# Patient Record
Sex: Female | Born: 1965 | Race: Black or African American | Hispanic: No | Marital: Married | Smoking: Never smoker
Health system: Southern US, Community
[De-identification: ages and names within clinical notes are randomized; demographics above are authoritative.]

## PROBLEM LIST (undated history)

## (undated) DIAGNOSIS — D649 Anemia, unspecified: Secondary | ICD-10-CM

## (undated) DIAGNOSIS — Z9221 Personal history of antineoplastic chemotherapy: Secondary | ICD-10-CM

## (undated) DIAGNOSIS — Z923 Personal history of irradiation: Secondary | ICD-10-CM

## (undated) DIAGNOSIS — E739 Lactose intolerance, unspecified: Secondary | ICD-10-CM

## (undated) DIAGNOSIS — C50919 Malignant neoplasm of unspecified site of unspecified female breast: Secondary | ICD-10-CM

## (undated) DIAGNOSIS — M542 Cervicalgia: Secondary | ICD-10-CM

## (undated) DIAGNOSIS — M549 Dorsalgia, unspecified: Secondary | ICD-10-CM

## (undated) DIAGNOSIS — M25569 Pain in unspecified knee: Secondary | ICD-10-CM

## (undated) DIAGNOSIS — Z87440 Personal history of urinary (tract) infections: Secondary | ICD-10-CM

## (undated) HISTORY — DX: Personal history of urinary (tract) infections: Z87.440

## (undated) HISTORY — PX: BACK SURGERY: SHX140

## (undated) HISTORY — DX: Anemia, unspecified: D64.9

## (undated) HISTORY — DX: Pain in unspecified knee: M25.569

## (undated) HISTORY — DX: Malignant neoplasm of unspecified site of unspecified female breast: C50.919

## (undated) HISTORY — PX: FOOT SURGERY: SHX648

## (undated) HISTORY — DX: Cervicalgia: M54.2

## (undated) HISTORY — DX: Personal history of antineoplastic chemotherapy: Z92.21

## (undated) HISTORY — DX: Lactose intolerance, unspecified: E73.9

## (undated) HISTORY — DX: Dorsalgia, unspecified: M54.9

---

## 1998-04-22 ENCOUNTER — Emergency Department (HOSPITAL_COMMUNITY): Admission: EM | Admit: 1998-04-22 | Discharge: 1998-04-22 | Payer: Self-pay | Admitting: Emergency Medicine

## 2000-09-15 ENCOUNTER — Encounter: Payer: Self-pay | Admitting: Emergency Medicine

## 2000-09-15 ENCOUNTER — Emergency Department (HOSPITAL_COMMUNITY): Admission: EM | Admit: 2000-09-15 | Discharge: 2000-09-15 | Payer: Self-pay | Admitting: Emergency Medicine

## 2002-08-23 ENCOUNTER — Encounter: Admission: RE | Admit: 2002-08-23 | Discharge: 2002-08-23 | Payer: Self-pay | Admitting: Family Medicine

## 2002-08-23 ENCOUNTER — Encounter: Payer: Self-pay | Admitting: Family Medicine

## 2002-11-01 ENCOUNTER — Emergency Department (HOSPITAL_COMMUNITY): Admission: EM | Admit: 2002-11-01 | Discharge: 2002-11-01 | Payer: Self-pay | Admitting: Emergency Medicine

## 2003-06-05 ENCOUNTER — Encounter: Payer: Self-pay | Admitting: Obstetrics and Gynecology

## 2003-06-05 ENCOUNTER — Ambulatory Visit (HOSPITAL_COMMUNITY): Admission: RE | Admit: 2003-06-05 | Discharge: 2003-06-05 | Payer: Self-pay | Admitting: Obstetrics and Gynecology

## 2005-03-01 ENCOUNTER — Ambulatory Visit: Payer: Self-pay | Admitting: Family Medicine

## 2005-04-25 ENCOUNTER — Ambulatory Visit: Payer: Self-pay | Admitting: Family Medicine

## 2005-09-07 ENCOUNTER — Ambulatory Visit (HOSPITAL_COMMUNITY): Admission: RE | Admit: 2005-09-07 | Discharge: 2005-09-07 | Payer: Self-pay | Admitting: Obstetrics and Gynecology

## 2005-10-22 ENCOUNTER — Ambulatory Visit: Payer: Self-pay | Admitting: Internal Medicine

## 2006-03-01 ENCOUNTER — Ambulatory Visit: Payer: Self-pay | Admitting: Family Medicine

## 2006-07-28 ENCOUNTER — Ambulatory Visit: Payer: Self-pay | Admitting: Family Medicine

## 2006-09-07 ENCOUNTER — Ambulatory Visit: Payer: Self-pay | Admitting: Family Medicine

## 2006-09-25 ENCOUNTER — Ambulatory Visit (HOSPITAL_COMMUNITY): Admission: RE | Admit: 2006-09-25 | Discharge: 2006-09-25 | Payer: Self-pay | Admitting: Obstetrics and Gynecology

## 2007-04-20 ENCOUNTER — Ambulatory Visit: Payer: Self-pay | Admitting: Internal Medicine

## 2007-10-17 ENCOUNTER — Ambulatory Visit (HOSPITAL_COMMUNITY): Admission: RE | Admit: 2007-10-17 | Discharge: 2007-10-17 | Payer: Self-pay | Admitting: Obstetrics and Gynecology

## 2008-02-12 ENCOUNTER — Telehealth (INDEPENDENT_AMBULATORY_CARE_PROVIDER_SITE_OTHER): Payer: Self-pay | Admitting: *Deleted

## 2008-02-13 ENCOUNTER — Ambulatory Visit: Payer: Self-pay | Admitting: Family Medicine

## 2008-02-13 DIAGNOSIS — H9209 Otalgia, unspecified ear: Secondary | ICD-10-CM | POA: Insufficient documentation

## 2008-02-13 DIAGNOSIS — J069 Acute upper respiratory infection, unspecified: Secondary | ICD-10-CM | POA: Insufficient documentation

## 2008-05-26 ENCOUNTER — Ambulatory Visit: Payer: Self-pay | Admitting: Internal Medicine

## 2008-05-26 DIAGNOSIS — J019 Acute sinusitis, unspecified: Secondary | ICD-10-CM

## 2008-05-27 ENCOUNTER — Telehealth (INDEPENDENT_AMBULATORY_CARE_PROVIDER_SITE_OTHER): Payer: Self-pay | Admitting: *Deleted

## 2008-06-10 ENCOUNTER — Ambulatory Visit: Payer: Self-pay | Admitting: Internal Medicine

## 2009-12-10 ENCOUNTER — Encounter: Payer: Self-pay | Admitting: Family Medicine

## 2009-12-15 ENCOUNTER — Encounter: Payer: Self-pay | Admitting: Family Medicine

## 2010-08-23 ENCOUNTER — Ambulatory Visit: Payer: Self-pay | Admitting: Family Medicine

## 2010-08-23 DIAGNOSIS — R634 Abnormal weight loss: Secondary | ICD-10-CM | POA: Insufficient documentation

## 2010-08-23 DIAGNOSIS — M25559 Pain in unspecified hip: Secondary | ICD-10-CM | POA: Insufficient documentation

## 2010-08-24 ENCOUNTER — Encounter: Payer: Self-pay | Admitting: Family Medicine

## 2010-08-25 LAB — CONVERTED CEMR LAB
ALT: 15 units/L (ref 0–35)
AST: 21 units/L (ref 0–37)
Albumin: 3.8 g/dL (ref 3.5–5.2)
Alkaline Phosphatase: 58 units/L (ref 39–117)
Basophils Relative: 0.6 % (ref 0.0–3.0)
CO2: 26 meq/L (ref 19–32)
Calcium: 10 mg/dL (ref 8.4–10.5)
Folate: 11.8 ng/mL
GFR calc non Af Amer: 84.79 mL/min (ref >60)
Glucose, Bld: 66 mg/dL — ABNORMAL LOW (ref 70–99)
HCT: 32.6 % — ABNORMAL LOW (ref 36.0–46.0)
Hemoglobin: 11 g/dL — ABNORMAL LOW (ref 12.0–15.0)
Lymphocytes Relative: 22.4 % (ref 12.0–46.0)
Lymphs Abs: 1.3 10*3/uL (ref 0.7–4.0)
Monocytes Relative: 5.3 % (ref 3.0–12.0)
Neutro Abs: 4.1 10*3/uL (ref 1.4–7.7)
Potassium: 4.2 meq/L (ref 3.5–5.1)
RBC: 3.67 M/uL — ABNORMAL LOW (ref 3.87–5.11)
Sodium: 135 meq/L (ref 135–145)
TSH: 2.33 microintl units/mL (ref 0.35–5.50)
Total Protein: 6.6 g/dL (ref 6.0–8.3)

## 2010-09-09 ENCOUNTER — Ambulatory Visit: Payer: Self-pay | Admitting: Family Medicine

## 2010-09-24 ENCOUNTER — Encounter: Payer: Self-pay | Admitting: Family Medicine

## 2010-09-24 ENCOUNTER — Ambulatory Visit: Payer: Self-pay | Admitting: Internal Medicine

## 2010-09-24 DIAGNOSIS — R198 Other specified symptoms and signs involving the digestive system and abdomen: Secondary | ICD-10-CM

## 2010-09-24 DIAGNOSIS — K5900 Constipation, unspecified: Secondary | ICD-10-CM

## 2010-09-24 DIAGNOSIS — K5909 Other constipation: Secondary | ICD-10-CM | POA: Insufficient documentation

## 2010-09-24 DIAGNOSIS — K6289 Other specified diseases of anus and rectum: Secondary | ICD-10-CM

## 2010-09-27 ENCOUNTER — Telehealth (INDEPENDENT_AMBULATORY_CARE_PROVIDER_SITE_OTHER): Payer: Self-pay | Admitting: *Deleted

## 2010-09-27 LAB — CONVERTED CEMR LAB
Basophils Absolute: 0 10*3/uL (ref 0.0–0.1)
Basophils Relative: 0 % (ref 0–1)
Eosinophils Absolute: 0 10*3/uL (ref 0.0–0.7)
Eosinophils Relative: 0 % (ref 0–5)
HCT: 33.8 % — ABNORMAL LOW (ref 36.0–46.0)
Lymphs Abs: 1.4 10*3/uL (ref 0.7–4.0)
MCHC: 31.4 g/dL (ref 30.0–36.0)
MCV: 89.4 fL (ref 78.0–100.0)
Platelets: 221 10*3/uL (ref 150–400)
RDW: 12.6 % (ref 11.5–15.5)

## 2010-10-01 ENCOUNTER — Telehealth: Payer: Self-pay | Admitting: Internal Medicine

## 2010-10-02 ENCOUNTER — Encounter: Payer: Self-pay | Admitting: Internal Medicine

## 2010-10-13 ENCOUNTER — Encounter: Payer: Self-pay | Admitting: Family Medicine

## 2010-10-22 ENCOUNTER — Telehealth (INDEPENDENT_AMBULATORY_CARE_PROVIDER_SITE_OTHER): Payer: Self-pay | Admitting: *Deleted

## 2010-10-29 ENCOUNTER — Ambulatory Visit: Payer: Self-pay | Admitting: Family Medicine

## 2010-11-01 ENCOUNTER — Encounter: Payer: Self-pay | Admitting: Family Medicine

## 2010-12-06 ENCOUNTER — Telehealth (INDEPENDENT_AMBULATORY_CARE_PROVIDER_SITE_OTHER): Payer: Self-pay | Admitting: *Deleted

## 2010-12-12 ENCOUNTER — Encounter: Payer: Self-pay | Admitting: Obstetrics and Gynecology

## 2010-12-23 NOTE — Op Note (Signed)
Summary: [facility]  [facility]   Imported By: Lanelle Bal 10/23/2010 11:29:09  _____________________________________________________________________  External Attachment:    Type:   Image     Comment:   External Document

## 2010-12-23 NOTE — Consult Note (Signed)
Summary: [facility]   Imported By: Lanelle Bal 12/17/2010 11:20:11  _____________________________________________________________________  External Attachment:    Type:   Image     Comment:   External Document

## 2010-12-23 NOTE — Assessment & Plan Note (Signed)
Summary: LEFT LEG STILL HURTS///SPH   Vital Signs:  Patient profile:   45 year old female Weight:      171.0 pounds Temp:     98.1 degrees F oral Pulse rate:   72 / minute Pulse rhythm:   regular BP sitting:   118 / 80  (left arm) Cuff size:   regular  Vitals Entered By: [name] CMA Duncan Dull) (September 09, 2010 8:24 AM) CC: c/o pain to the left hip and leg Pain Assessment Patient in pain? yes     Location: hip Intensity: 10 Type: sharp Onset of pain  With activity   History of Present Illness: Pt here still c/o L hip pain that radiates to low leg--it does not radiate to foot.  Pt had MRI in [place] and she has brought in those results.   Steps and walking do not bother her but sitting is very painful.     Current Medications (verified): 1)  Flexeril 10 Mg Tabs (Cyclobenzaprine Hcl) .Marland Kitchen.. 1 By Mouth Three Times A Day As Needed 2)  Advil 200 Mg Caps (Ibuprofen) .... 2 By Mouth As Needed  For Pain 3)  Vicodin Es 7.5-750 Mg Tabs (Hydrocodone-Acetaminophen) .Marland Kitchen.. 1 By Mouth Q6h As Needed  Allergies (verified): 1)  ! Penicillin 2)  ! Clarithromycin (Clarithromycin) 3)  ! * Shrimp  Past History:  Past medical, surgical, family and social histories (including risk factors) reviewed for relevance to current acute and chronic problems.  Family History: Reviewed history from 04/23/2007 and no changes required. Family History Diabetes 1st degree relative Family History Hypertension  Social History: Reviewed history from 04/23/2007 and no changes required. Married Never Smoked Alcohol use-no Drug use-no  Review of Systems      See HPI  Physical Exam  General:  Well-developed,well-nourished,in no acute distress; alert,appropriate and cooperative throughout examination Msk:  No deformity or scoliosis noted of thoracic or lumbar spine.   Extremities:  No clubbing, cyanosis, edema, or deformity noted with normal full range of motion of all joints.   Neurologic:  alert &  oriented X3, strength normal in all extremities, gait normal, and DTRs symmetrical and normal.   Skin:  Intact without suspicious lesions or rashes Psych:  Cognition and judgment appear intact. Alert and cooperative with normal attention span and concentration. No apparent delusions, illusions, hallucinations   Impression & Recommendations:  Problem # 1:  HIP PAIN, LEFT, CHRONIC (ICD-719.45)  Her updated medication list for this problem includes:    Flexeril 10 Mg Tabs (Cyclobenzaprine hcl) .Marland Kitchen... 1 by mouth three times a day as needed    Advil 200 Mg Caps (Ibuprofen) .Marland Kitchen... 2 by mouth as needed  for pain    Vicodin Es 7.5-750 Mg Tabs (Hydrocodone-acetaminophen) .Marland Kitchen... 1 by mouth q6h as needed  Orders: Orthopedic Surgeon Referral (Ortho Surgeon)  Discussed use of medications, application of heat or cold, and exercises.   Complete Medication List: 1)  Flexeril 10 Mg Tabs (Cyclobenzaprine hcl) .Marland Kitchen.. 1 by mouth three times a day as needed 2)  Advil 200 Mg Caps (Ibuprofen) .... 2 by mouth as needed  for pain 3)  Vicodin Es 7.5-750 Mg Tabs (Hydrocodone-acetaminophen) .Marland Kitchen.. 1 by mouth q6h as needed Prescriptions: VICODIN ES 7.5-750 MG TABS (HYDROCODONE-ACETAMINOPHEN) 1 by mouth q6h as needed  #30 x 0   Entered and Authorized by:   [name] DO   Signed by:   [name] DO on 09/09/2010   Method used:   Print then Give to Patient  RxID:   [id number]    Orders Added: 1)  Orthopedic Surgeon Referral [Ortho Surgeon] 2)  Est. Patient Level III [14782]

## 2010-12-23 NOTE — Letter (Signed)
Summary: [facility]: Immunoassay Fecal Occult Blood (iFOB) Order Form  [facility] [place]  [address] [place], [place] [address]   Phone: [phone]  Fax: [phone]      [facility]: Immunoassay Fecal Occult Blood (iFOB) Order Form   August 24, 2010 MRN: [id number]   Alisha Chen 03-31-1966   Physicican Name: Dr.Lowne  Diagnosis Code: V56.71       [name] CMA (AAMA)

## 2010-12-23 NOTE — Progress Notes (Signed)
Summary: Lab Results  Phone Note Outgoing Call Call back at [facility] Phone [phone]   Call placed by: Shonna Chock CMA,  September 27, 2010 3:13 PM Call placed to: Patient Details for Reason: Lab Results Summary of Call: Spoke with patient   Mild anemia still present, but it is  stable to improved. Using the topical rather than oral narcotic  should improve the bowel symptoms by being less consstipating..Also the Probiotic , Align. is recommended once daily until bowels are normal. This will replace normal GI tract organisms.  Marland KitchenShonna Chock CMA  September 27, 2010 3:13 PM

## 2010-12-23 NOTE — Consult Note (Signed)
Summary: [facility]   Imported By: Lanelle Bal 10/13/2010 13:52:15  _____________________________________________________________________  External Attachment:    Type:   Image     Comment:   External Document

## 2010-12-23 NOTE — Assessment & Plan Note (Signed)
Summary: fever/loosing weight//kn   Vital Signs:  Patient profile:   45 year old female Height:      67 inches Weight:      170.6 pounds BMI:     26.82 Temp:     98.3 degrees F oral Pulse rate:   72 / minute Pulse rhythm:   regular BP sitting:   112 / 66  (right arm) Cuff size:   regular  Vitals Entered By: [name] CMA Duncan Dull) (August 23, 2010 11:03 AM) CC: c/o fever and weight loss   History of Present Illness: Pt here because she is concerned about low grade fevers and weight loss.  When she got back from [place]  she stopped exercising and is still loosing weight.  She states she is not dieting at all.  In [place] she was exercising and eating what she wanted and lost 20 lbs.    Pt otherwise feels ok except for pain in L hip.  Pt had been struggling with it since she has been back.  She had xray in [place] and was told she probably had a pinched nerve--- she had disk space narrowing.   Pain goes down L leg.    Current Medications (verified): 1)  Flexeril 10 Mg Tabs (Cyclobenzaprine Hcl) .Marland Kitchen.. 1 By Mouth Three Times A Day As Needed  Allergies (verified): 1)  ! Penicillin 2)  ! Clarithromycin (Clarithromycin) 3)  ! * Shrimp  Past History:  Past medical, surgical, family and social histories (including risk factors) reviewed for relevance to current acute and chronic problems.  Family History: Reviewed history from 04/23/2007 and no changes required. Family History Diabetes 1st degree relative Family History Hypertension  Social History: Reviewed history from 04/23/2007 and no changes required. Married Never Smoked Alcohol use-no Drug use-no  Review of Systems      See HPI  Physical Exam  General:  Well-developed,well-nourished,in no acute distress; alert,appropriate and cooperative throughout examination Ears:  External ear exam shows no significant lesions or deformities.  Otoscopic examination reveals clear canals, tympanic membranes are intact bilaterally without  bulging, retraction, inflammation or discharge. Hearing is grossly normal bilaterally. Nose:  External nasal examination shows no deformity or inflammation. Nasal mucosa are pink and moist without lesions or exudates. Mouth:  Oral mucosa and oropharynx without lesions or exudates.  Teeth in good repair. Neck:  No deformities, masses, or tenderness noted. Lungs:  Normal respiratory effort, chest expands symmetrically. Lungs are clear to auscultation, no crackles or wheezes. Heart:  Normal rate and regular rhythm. S1 and S2 normal without gallop, murmur, click, rub or other extra sounds. Abdomen:  Bowel sounds positive,abdomen soft and non-tender without masses, organomegaly or hernias noted. Extremities:  No clubbing, cyanosis, edema, or deformity noted with normal full range of motion of all joints.   Skin:  Intact without suspicious lesions or rashes Cervical Nodes:  No lymphadenopathy noted Psych:  Cognition and judgment appear intact. Alert and cooperative with normal attention span and concentration. No apparent delusions, illusions, hallucinations   Impression & Recommendations:  Problem # 1:  WEIGHT LOSS (ICD-783.21) if all is negative consider GI referral Orders: Venipuncture ([address]) TLB-B12 + Folate Pnl (60454_09811-B14/NWG) TLB-BMP (Basic Metabolic Panel-BMET) (80048-METABOL) TLB-CBC Platelet - w/Differential (85025-CBCD) TLB-Hepatic/Liver Function Pnl (80076-HEPATIC) TLB-TSH (Thyroid Stimulating Hormone) (84443-TSH) TLB-T3, Free (Triiodothyronine) (84481-T3FREE) TLB-T4 (Thyrox), Free (95621-HY8M)  Problem # 2:  HIP PAIN, LEFT, CHRONIC (ICD-719.45)  Her updated medication list for this problem includes:    Flexeril 10 Mg Tabs (Cyclobenzaprine hcl) .Marland KitchenMarland KitchenMarland KitchenMarland Kitchen  1 by mouth three times a day as needed  Orders: Physical Therapy Referral (PT)  Discussed use of medications, application of heat or cold, and exercises.   Complete Medication List: 1)  Flexeril 10 Mg Tabs  (Cyclobenzaprine hcl) .Marland Kitchen.. 1 by mouth three times a day as needed Prescriptions: FLEXERIL 10 MG TABS (CYCLOBENZAPRINE HCL) 1 by mouth three times a day as needed  #30 x 0   Entered and Authorized by:   [name] DO   Signed by:   [name] DO on 08/23/2010   Method used:   [facility] to        [facility]  [address] [id number]       [address]       [place], [place]  [address]       Ph: [phone] or [phone]       Fax: [phone]   RxID:   [id number]

## 2010-12-23 NOTE — Progress Notes (Signed)
Summary: pain patch not working  Phone Note Call from Patient Call back at [facility] Phone [phone]   Details for Reason: [facility] [address] Summary of Call: Patient notes that the pain in her leg is severe and the pain patch is not helping at all. Pt notes that she previously tried Hydrocodone but that caused abd pain and GI upset. Please advise. Initial call taken by: Lucious Groves CMA,  October 01, 2010 4:51 PM  Follow-up for Phone Call        Patient notified and will take off the pain patch and try Tramadol. [facility] [address] Follow-up by: Lucious Groves CMA,  October 01, 2010 5:17 PM    New/Updated Medications: TRAMADOL HCL 50 MG TABS (TRAMADOL HCL) 1 every 6 hrs as needed  for pain Prescriptions: TRAMADOL HCL 50 MG TABS (TRAMADOL HCL) 1 every 6 hrs as needed  for pain  #30 x 0   Entered and Authorized by:   Marga Melnick MD   Signed by:   Marga Melnick MD on 10/01/2010   Method used:   Printed then faxed to ...         RxID:   [id number]

## 2010-12-23 NOTE — Progress Notes (Signed)
Summary: I-fob not sent into [facility]  Phone Note From Other Clinic   Caller: Clydie Braun @ [facility] Details for Reason: I-fob not sent back to lab Summary of Call: I called patient and advised that the lab has not recieved her I-fob, per pt she had not done it yet and not sure where she put it. I advised I can leave another upfront for her, pt agreed and stated she will pick it up next week, advised if not she will be charged, she voiced understanding. call ended. Initial call taken by: [name] CMA Duncan Dull),  October 22, 2010 4:52 PM

## 2010-12-23 NOTE — Assessment & Plan Note (Signed)
Summary: PAINFUL LOOSE STOOLS/RH......Marland Kitchen   Vital Signs:  Patient profile:   45 year old female Weight:      170.2 pounds BMI:     26.75 Temp:     99.0 degrees F oral Pulse rate:   88 / minute Resp:     17 per minute BP sitting:   114 / 76  (right arm) Cuff size:   large  Vitals Entered By: Shonna Chock CMA (September 24, 2010 1:42 PM) CC: Onset Wed: Constipation all night until 6am Thurs, patient took suppository and the stool loosened and now painful loose stools (dark brown). Last BM about 11am today, patient unable to eat or unable to tell if blood present in stool due to currently on Menstural.   Primary Care Provider:  Laury Axon  CC:  Onset Wed: Constipation all night until 6am Thurs, patient took suppository and the stool loosened and now painful loose stools (dark brown). Last BM about 11am today, and patient unable to eat or unable to tell if blood present in stool due to currently on Menstural..  History of Present Illness: Onset approx 5 pm  11/02 as constipation . Stools  soft  since Glycerin use 10 pm 11/2. Stools  had become frequent , malordorous  & dark   from  5 pm   11/03 to 11 am today. She has stopped eating as of 11/02 due to pain. She is on Hydrocodone every 4-6 hrs as needed for leg pain..  The patient reports rectal pain,  abdominal cramping, and nausea.  Associated symptoms include low grade  fever, up to 100.  The patient denies the following symptoms: urinary retention.  Risk factors for constipation include  narcotic medication X 1 month.  The patient's medical history is  negative  for irritable bowel syndrome or GI processes . No PMH of thyroid disease. No FH bowel disease  .  Current Medications (verified): 1)  Flexeril 10 Mg Tabs (Cyclobenzaprine Hcl) .Marland Kitchen.. 1 By Mouth Three Times A Day As Needed 2)  Advil 200 Mg Caps (Ibuprofen) .... 2 By Mouth As Needed  For Pain 3)  Hydrocodone-Acetaminophen 5-325 Mg Tabs (Hydrocodone-Acetaminophen) .Marland Kitchen.. 1 By Mouth Every 4-6  Hours As Needed  Allergies: 1)  ! Penicillin 2)  ! Clarithromycin (Clarithromycin) 3)  ! * Shrimp  Physical Exam  General:  well-nourished,in no acute distress; alert,appropriate and cooperative throughout examination; uncomfortable-appearing.   Eyes:  No corneal or conjunctival inflammation noted. No icterus Mouth:  Oral mucosa and oropharynx without lesions or exudates.  No pharyngeal erythema.  Tongue moist Lungs:  Normal respiratory effort, chest expands symmetrically. Lungs are clear to auscultation, no crackles or wheezes. Heart:  Normal rate and regular rhythm. S1 and S2 normal without gallop, murmur, click, rub or other extra sounds. Abdomen:  Bowel sounds positive,abdomen soft and non-tender without masses, organomegaly or hernias noted. Rectal:  normal sphincter tone and perirectal tenderness.   Stool dark w/o frank melena, trace + Neurologic:  alert & oriented X3 and DTRs symmetrical and normal.   Skin:  Intact without suspicious lesions or rashes. No tenting Cervical Nodes:  No lymphadenopathy noted Axillary Nodes:  No palpable lymphadenopathy Psych:  memory intact for recent and remote, normally interactive, and good eye contact.     Impression & Recommendations:  Problem # 1:  CONSTIPATION (ICD-564.00)  Orders: Venipuncture (35009) TLB-CBC Platelet - w/Differential (85025-CBCD)  Problem # 2:  RECTAL PAIN (FGH-829.93)  Orders: Venipuncture (71696) TLB-CBC Platelet - w/Differential (85025-CBCD)  Problem #  3:  CHANGE IN BOWELS (ICD-787.99)  dark , malordorous  Orders: Venipuncture (40981) TLB-CBC Platelet - w/Differential (85025-CBCD)  Complete Medication List: 1)  Flexeril 10 Mg Tabs (Cyclobenzaprine hcl) .Marland Kitchen.. 1 by mouth three times a day as needed 2)  Advil 200 Mg Caps (Ibuprofen) .... 2 by mouth as needed  for pain 3)  Hydrocodone-acetaminophen 5-325 Mg Tabs (Hydrocodone-acetaminophen) .Marland Kitchen.. 1 by mouth every 4-6 hours as needed 4)  Fentanyl 25 Mcg/hr Pt72  (Fentanyl) .Marland Kitchen.. 1 patch every 3 days as needed 5)  Proctofoam 1 % Foam (Pramoxine hcl) .... Apply two times a day three times a day after sitz baths  Patient Instructions: 1)  Drink clear liquids only for the next 24 hours, then slowly add other liquids and food as you  tolerate them. Use pain patch instead of pill to decrease impact  of pain med on  gut. Prescriptions: PROCTOFOAM 1 % FOAM (PRAMOXINE HCL) apply two times a day three times a day after Sitz baths  #1 x 1   Entered and Authorized by:   Marga Melnick MD   Signed by:   Marga Melnick MD on 09/24/2010   Method used:   Print then Give to Patient   RxID:   [id number] FENTANYL 25 MCG/HR PT72 (FENTANYL) 1 patch every 3 days as needed  #3 x 0   Entered and Authorized by:   Marga Melnick MD   Signed by:   Marga Melnick MD on 09/24/2010   Method used:   Print then Give to Patient   RxID:   [id number]    Orders Added: 1)  Est. Patient Level IV [44010] 2)  Venipuncture [[address]] 3)  TLB-CBC Platelet - w/Differential [85025-CBCD]  Appended Document: PAINFUL LOOSE STOOLS/RH......Marland Kitchen

## 2010-12-23 NOTE — Progress Notes (Signed)
Summary: [facility] 1/16---2ND OPINION  Phone Note Outgoing Call   Call placed by: [name] CMA Duncan Dull),  December 06, 2010 4:48 PM Call placed to: Patient Summary of Call: Recieved MRI results from [facility] and Dr.Lowne wanted to know if pt would like a second opinion from [facility]..... Left message to call back.... [name] CMA Duncan Dull)  December 06, 2010 4:49 PM  Initial call taken by: [name] CMA Duncan Dull),  December 06, 2010 4:49 PM  Follow-up for Phone Call        per pt she spoke with Dr.Gioffre and he advised that he can do a test where he can shoot dye in her back and do an addt'l test and if he can not find anything he will send her for a second opinion... I made Dr.Lowne aware..... [name] CMA Duncan Dull)  December 07, 2010 10:23 AM

## 2011-01-13 ENCOUNTER — Other Ambulatory Visit: Payer: Self-pay | Admitting: Orthopedic Surgery

## 2011-01-13 DIAGNOSIS — M4800 Spinal stenosis, site unspecified: Secondary | ICD-10-CM

## 2011-01-17 ENCOUNTER — Ambulatory Visit
Admission: RE | Admit: 2011-01-17 | Discharge: 2011-01-17 | Disposition: A | Discharge status: Home / Self Care | Payer: BC Managed Care – PPO | Source: Ambulatory Visit | Attending: Orthopedic Surgery | Admitting: Orthopedic Surgery

## 2011-01-17 DIAGNOSIS — M4800 Spinal stenosis, site unspecified: Secondary | ICD-10-CM

## 2011-01-20 HISTORY — PX: SPINE SURGERY: SHX786

## 2011-01-27 ENCOUNTER — Encounter: Payer: Self-pay | Admitting: Internal Medicine

## 2011-02-10 ENCOUNTER — Other Ambulatory Visit: Payer: Self-pay | Admitting: Orthopedic Surgery

## 2011-02-10 ENCOUNTER — Encounter (HOSPITAL_COMMUNITY): Payer: BC Managed Care – PPO

## 2011-02-10 ENCOUNTER — Ambulatory Visit (HOSPITAL_COMMUNITY)
Admission: RE | Admit: 2011-02-10 | Discharge: 2011-02-10 | Disposition: A | Discharge status: Home / Self Care | Payer: BC Managed Care – PPO | Source: Ambulatory Visit | Attending: Orthopedic Surgery | Admitting: Orthopedic Surgery

## 2011-02-10 ENCOUNTER — Other Ambulatory Visit (HOSPITAL_COMMUNITY): Payer: Self-pay | Admitting: Orthopedic Surgery

## 2011-02-10 DIAGNOSIS — M47817 Spondylosis without myelopathy or radiculopathy, lumbosacral region: Secondary | ICD-10-CM | POA: Insufficient documentation

## 2011-02-10 DIAGNOSIS — D649 Anemia, unspecified: Secondary | ICD-10-CM | POA: Insufficient documentation

## 2011-02-10 DIAGNOSIS — Z79899 Other long term (current) drug therapy: Secondary | ICD-10-CM | POA: Insufficient documentation

## 2011-02-10 DIAGNOSIS — Z01818 Encounter for other preprocedural examination: Secondary | ICD-10-CM

## 2011-02-10 DIAGNOSIS — K649 Unspecified hemorrhoids: Secondary | ICD-10-CM | POA: Insufficient documentation

## 2011-02-10 DIAGNOSIS — Z01812 Encounter for preprocedural laboratory examination: Secondary | ICD-10-CM | POA: Insufficient documentation

## 2011-02-10 LAB — DIFFERENTIAL
Basophils Absolute: 0 10*3/uL (ref 0.0–0.1)
Eosinophils Absolute: 0.2 10*3/uL (ref 0.0–0.7)
Eosinophils Relative: 4 % (ref 0–5)
Lymphocytes Relative: 23 % (ref 12–46)
Lymphs Abs: 1.1 10*3/uL (ref 0.7–4.0)
Neutrophils Relative %: 66 % (ref 43–77)

## 2011-02-10 LAB — SURGICAL PCR SCREEN
MRSA, PCR: NEGATIVE
Staphylococcus aureus: NEGATIVE

## 2011-02-10 LAB — URINALYSIS, ROUTINE W REFLEX MICROSCOPIC
Ketones, ur: NEGATIVE mg/dL
Protein, ur: NEGATIVE mg/dL
Specific Gravity, Urine: 1.023 (ref 1.005–1.030)
pH: 6.5 (ref 5.0–8.0)

## 2011-02-10 LAB — COMPREHENSIVE METABOLIC PANEL
AST: 20 U/L (ref 0–37)
Albumin: 4 g/dL (ref 3.5–5.2)
Alkaline Phosphatase: 66 U/L (ref 39–117)
Chloride: 105 mEq/L (ref 96–112)
GFR calc Af Amer: >60 mL/min (ref >60)
Potassium: 4.1 mEq/L (ref 3.5–5.1)
Sodium: 139 mEq/L (ref 135–145)
Total Bilirubin: 0.4 mg/dL (ref 0.3–1.2)

## 2011-02-10 LAB — CBC
HCT: 34.2 % — ABNORMAL LOW (ref 36.0–46.0)
MCV: 87.5 fL (ref 78.0–100.0)
Platelets: 271 10*3/uL (ref 150–400)
RBC: 3.91 MIL/uL (ref 3.87–5.11)
RDW: 13.3 % (ref 11.5–15.5)
WBC: 4.7 10*3/uL (ref 4.0–10.5)

## 2011-02-10 LAB — APTT: aPTT: 34 seconds (ref 24–37)

## 2011-02-15 ENCOUNTER — Ambulatory Visit (HOSPITAL_COMMUNITY): Payer: BC Managed Care – PPO

## 2011-02-15 ENCOUNTER — Inpatient Hospital Stay (HOSPITAL_COMMUNITY)
Admission: RE | Admit: 2011-02-15 | Discharge: 2011-02-17 | DRG: 758 | Disposition: A | Discharge status: Home / Self Care | Payer: BC Managed Care – PPO | Source: Ambulatory Visit | Attending: Orthopedic Surgery | Admitting: Orthopedic Surgery

## 2011-02-15 DIAGNOSIS — R51 Headache: Secondary | ICD-10-CM | POA: Diagnosis not present

## 2011-02-15 DIAGNOSIS — R339 Retention of urine, unspecified: Secondary | ICD-10-CM | POA: Diagnosis not present

## 2011-02-15 DIAGNOSIS — M48061 Spinal stenosis, lumbar region without neurogenic claudication: Principal | ICD-10-CM | POA: Diagnosis present

## 2011-02-15 DIAGNOSIS — Z01812 Encounter for preprocedural laboratory examination: Secondary | ICD-10-CM

## 2011-02-15 LAB — GLUCOSE, CAPILLARY

## 2011-02-16 LAB — GLUCOSE, CAPILLARY
Glucose-Capillary: 98 mg/dL (ref 70–99)
Glucose-Capillary: 112 mg/dL — ABNORMAL HIGH (ref 70–99)

## 2011-02-16 NOTE — H&P (Addendum)
NAME:  Alisha Chen, Alisha Chen              ACCOUNT NO.:  [id number]  MEDICAL RECORD NO.:  [id number]           PATIENT TYPE:  O  LOCATION:  XRAY                         FACILITY:  [facility]  PHYSICIAN:  Georges Lynch. Marithza Malachi, M.D.DATE OF BIRTH:  [birth year]  DATE OF ADMISSION:  02/10/2011 DATE OF DISCHARGE:  02/10/2011                             HISTORY & PHYSICAL   CHIEF COMPLAINT:  Low back pain.  HISTORY OF PRESENT ILLNESS:  Alisha Chen has been seen for continuing pain in her lumbar spine.  She was sent by Dr. Darrelyn Hillock for a CT myelogram that revealed severe L5-S1 facet arthrosis with a grade I anterior listhesis.  She had an epidural steroid injection and this did not provide any relief.  At this point, she now presents for L5-S1 hemilaminectomy and microdiskectomy on the left.  MEDICATION ALLERGIES: 1. PENICILLIN, unsure of allergy. 2. She has sensitivity to PERCOCET and VICODIN.  These both cause GI     upset.  FOOD ALLERGIES: 1. She is allergic to [facility]. 2. She is unsure if she reacts to BETADINE.  PRIMARY CARE PHYSICIAN:  Lelon Perla, DO  CURRENT MEDICATIONS: 1. Nabumetone 500 mg 1 tablet p.o. b.i.d. 2. Hydromorphone 2 mg 1 tablet p.o. q.4-6h. p.r.n. pain. 3. Apple cider vinegar tablets.  PAST MEDICAL HISTORY: 1. Positive for hemorrhoids.  Denies blood in the stool. 2. She has history of anemia.  She occasionally has to take slow iron     tablets as prescribed by her doctor.  PAST SURGICAL HISTORY: 1. She has had bilateral foot surgery approximately 7-8 times. 2. She also has had a C section  FAMILY HISTORY:  Father passed away.  She is unsure of age or health problems.  Mother passed when she you was 32.  She was diabetic.  SOCIAL HISTORY:  The patient is married.  She works as a Runner, broadcasting/film/video.  She denies use of alcohol or tobacco products.  She does plan to go home following her hospital stay.  Her husband and son will take care of her. She lives in a one level  home.  REVIEW OF SYSTEMS:  GENERAL:  Positive for occasional fevers and some weight gain from inactivity. HEENT/NEUROLOGIC:  Positive for occasional headaches. DERMATOLOGIC:  Negative for rash or lesion. RESPIRATORY:  Negative for shortness of breath. CARDIOVASCULAR:  Negative for chest pain or palpitations. GI:  Positive for occasional nausea and hemorrhoids. GU:  Negative for hematuria, dysuria. MUSCULOSKELETAL:  Positive for back and left leg pain.  PHYSICAL EXAMINATION:  VITAL SIGNS:  Pulse 76, respirations 18, blood pressure 115/80 in the left arm. GENERAL:  Alisha Chen is alert and oriented x3.  She is a very pleasant 45 year old female.  She is in no apparent distress. NECK:  Supple.  Full range of motion without lymphadenopathy. CHEST:  Lungs are clear to auscultation bilaterally without wheezes. HEART:  Regular rate and rhythm without murmur. ABDOMEN:  Bowel sounds present in all four quadrants.  Abdomen is soft, nontender to palpation. EXTREMITIES:  She has pain with range of motion in the left leg and with range of motion of the lumbar spine.  Peripheral  vascular carotid pulses are 2+ bilaterally without bruit. SKIN:  Unremarkable.  IMAGING:  Patient had CT myelogram that revealed L5-S1 facet arthrosis with grade I anterior listhesis.  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  PLAN:  Hemilaminectomy of and microdiskectomy at L5-S1 on the left to be performed by Dr. Darrelyn Hillock.     Rozell Searing, PAC   ______________________________ Georges Lynch Darrelyn Hillock, M.D.    LD/MEDQ  D:  02/15/2011  T:  02/15/2011  Job:  [id number]  cc:   Lelon Perla, DO [address] [place], [place] [address]  Electronically Signed by Rozell Searing  on 02/16/2011 08:50:10 AM Electronically Signed by Ranee Gosselin M.D. on 02/22/2011 08:06:34 AM

## 2011-02-17 LAB — GLUCOSE, CAPILLARY

## 2011-02-17 NOTE — Discharge Summary (Addendum)
NAME:  Alisha Chen, Alisha Chen              ACCOUNT NO.:  [id number]  MEDICAL RECORD NO.:  [id number]           PATIENT TYPE:  I  LOCATION:  1528                         FACILITY:  [facility]  PHYSICIAN:  Georges Lynch. Candela Krul, M.D.DATE OF BIRTH:  1966-04-19  DATE OF ADMISSION:  02/15/2011 DATE OF DISCHARGE:                              DISCHARGE SUMMARY   ADMITTING DIAGNOSES: 1. Low back pain. 2. Hemorrhoids. 3. History of anemia.  DISCHARGE DIAGNOSES: 1. Low back pain status post complete decompressive lumbar     laminectomy, partial facetectomy and foraminotomy at L5-S1 on the     left. 2. Hemorrhoids. 3. History of anemia.  PROCEDURE:  On February 15, 2011, Ms. Steffy was taken to the operating room by surgeon Dr. Ranee Gosselin.  She underwent complete decompressive lumbar laminectomy at L5-S1, partial facetectomy at L5-S1 on the left, and foraminotomies for the L5 and S1 nerve root on the left.  The procedure was performed under general anesthesia.  The patient received 1 g IV Ancef preoperatively.  Routine orthopedic prep and drape were carried out.  There were no complications with the procedure.  She was returned to the recovery room in satisfactory condition.  HOSPITAL COURSE:  On March 27, Ms. Korpela was admitted to [facility].  She underwent the above-stated procedures without complications.  After adequate time in the recovery room, she was transported to the fifth floor for further recovery.  She was on reduced dose PCA Dilaudid and muscle relaxant for pain control.  On postoperative day #1, she had less pain in the left leg.  The PCA and Foley were discontinued.  She was able to ambulate with therapy using rolling walker.  She was experiencing some lightheadedness from sitting to standing.  On postoperative day #2, the patient continued to improve.  LABORATORY DATA:  Preoperative CBC reveals white count 4.7, hemoglobin 10.3, hematocrit 34.2.  Preoperative INR  1.04.  Preoperative chemistry panel  revealed hypoglycemia at 48.  Preoperative urinalysis was unremarkable.  Preoperative PCR for MRSA and staph were both negative. The patient's vital signs remained stable throughout her hospital stay.  DISPOSITION:  To home on February 17, 2011.  MEDICATIONS ON DISCHARGE: 1. Acetaminophen 325 mg 1 to 2 tablets p.o. q.4-6 h p.r.n. pain or     fever. 2. Hydrocortisone rectal foam. 3. Robaxin 500 mg 1 tablet p.o. q.6 h p.r.n. muscle spasms. 4. Hydromorphone 2 mg 1 to 2 tablets p.o. q.4-6 h p.r.n. pain. 5. Relafen 500 mg 1 tablet p.o. b.i.d.  ACTIVITIES:  The patient should increase her activity slowly.  She should use the walker if she is feeling unsteady.  DIET:  No restrictions.  WOUND CARE:  Daily dressing changes.  She should cover the wound with saran wrap when she showers for the next 3 days.  FOLLOWUP:  She will follow up with Dr. Darrelyn Hillock in 2 weeks from the day of surgery.  She should contact the office at [phone] to schedule this appointment.  CONDITION ON DISCHARGE:  Improving.     Rozell Searing, PAC   ______________________________ Georges Lynch Darrelyn Hillock, M.D.    LD/MEDQ  D:  02/17/2011  T:  02/17/2011  Job:  [id number]  Electronically Signed by Rozell Searing  on 02/17/2011 01:56:25 PM Electronically Signed by Ranee Gosselin M.D. on 02/22/2011 08:06:36 AM

## 2011-02-22 NOTE — Op Note (Signed)
  NAME:  Alisha Chen, Alisha Chen              ACCOUNT NO.:  [id number]  MEDICAL RECORD NO.:  [id number]           PATIENT TYPE:  I  LOCATION:  1528                         FACILITY:  [facility]  PHYSICIAN:  Georges Lynch. Mazy Culton, M.D.DATE OF BIRTH:  1966/04/01  DATE OF PROCEDURE: DATE OF DISCHARGE:  02/17/2011                              OPERATIVE REPORT   ADDENDUM  ASSISTANT ON THIS CASE:  Marlowe Kays, M.D.          ______________________________ Georges Lynch. Darrelyn Hillock, M.D.     RAG/MEDQ  D:  02/18/2011  T:  02/18/2011  Job:  [id number]  Electronically Signed by Ranee Gosselin M.D. on 02/22/2011 08:06:38 AM

## 2011-02-22 NOTE — Op Note (Signed)
NAME:  Alisha Chen, Alisha Chen              ACCOUNT NO.:  [id number]  MEDICAL RECORD NO.:  [id number]           PATIENT TYPE:  O  LOCATION:  XRAY                         FACILITY:  [facility]  PHYSICIAN:  Georges Lynch. Bryleigh Ottaway, M.D.DATE OF BIRTH:  1966-11-07  DATE OF PROCEDURE: DATE OF DISCHARGE:  02/10/2011                              OPERATIVE REPORT   PREOPERATIVE DIAGNOSES: 1. Spinal stenosis at L5-S1. 2. 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 3. Compression of the L5 root and the S1 root on the left.  Note:  All her symptoms were on the left leg only.  POSTOPERATIVE DIAGNOSES: 1. Spinal stenosis at L5-S1. 2. 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 3. Compression of the L5 root and the S1 root on the left.  OPERATIONS PERFORMED: 1. Complete decompressive lumbar laminectomy at L5-S1. 2. Partial facetectomy at L5-S1 on the left. 3. Foraminotomies for the L5 root and the S1 root on the left.  DESCRIPTION OF PROCEDURE:  The procedure under general anesthesia, routine orthopedic prep and drape of the lower back carried out.  She had went through the IV Ancef.  This time, the appropriate time-out was carried out.  I also marked the back on the left side in the holding area even though we were doing the central decompression, but we did mark the left side so that we would recall that all her symptoms are on the left.  Two needles were placed in the back for localization purposes.  The patient was on the spinal frame.  An x-ray was taken. Note, this was all done after sterile prep and draping.  Following that, an incision was made over the spinous process of L4-L5 and down to the sacrum.  Bleeders were identified and cauterized.  I stripped the muscle from the lamina and spinous processes bilaterally.  Bleeders were identified and cauterized.  Following this, I then inserted the [facility] retractors.  At this time, we exposed the sacrum and the L5- S1 space and a portion of L4-L5  above.  I then removed a portion of the spinous process of the L4, removed all the spinous process of L5.  I then went down carefully protecting the dura and did a complete decompressive lumbar laminectomy at L5-S1.  I went up on the lateral gutter to expose the L5 root above.  At this time, we had good hemostasis.  We had good visibility with the microscope.  The dura was adhesed distally to the lamina.  We put a hockey-stick down distally to make sure we were down far enough, we certainly were.  We also had an instrument proximally and we noted our proximal distance and we were actually in between the two instruments.  We exposed the dura quite nicely, at this point, we protected dura at all time with the cottonoids.  We then went out removed the ligamentum flavum bilaterally and completely decompressed the recess.  When we were finished, we were able to easily pass a hockey-stick out that five root foramen and distally.  Thoroughly irrigated out the area and then injected 10 mL of Surgiflo into the surgical site.  I removed the Surgiflo that  was over the dura.  I loosely applied some thrombin-soaked Gelfoam out around the periphery, closed the wound in layers in usual fashion.  I did leave a small distal deep and proximal part of the wound open for drainage purposes.  Subcutaneous was closed with Vicryl, skin with metal staples. Sterile Neosporin dressing was applied.  The patient left the operative room in satisfactory condition.          ______________________________ Georges Lynch Darrelyn Hillock, M.D.     RAG/MEDQ  D:  02/15/2011  T:  02/15/2011  Job:  [id number]  cc:   Windy Fast A. Darrelyn Hillock, M.D. Fax: [phone]  Electronically Signed by Ranee Gosselin M.D. on 02/22/2011 08:06:33 AM

## 2011-09-16 ENCOUNTER — Other Ambulatory Visit: Payer: Self-pay | Admitting: Obstetrics and Gynecology

## 2011-09-16 ENCOUNTER — Other Ambulatory Visit (HOSPITAL_COMMUNITY)
Admission: RE | Admit: 2011-09-16 | Discharge: 2011-09-16 | Disposition: A | Discharge status: Home / Self Care | Payer: BC Managed Care – PPO | Source: Ambulatory Visit | Attending: Obstetrics and Gynecology | Admitting: Obstetrics and Gynecology

## 2011-09-16 DIAGNOSIS — Z124 Encounter for screening for malignant neoplasm of cervix: Secondary | ICD-10-CM | POA: Insufficient documentation

## 2011-09-16 DIAGNOSIS — Z1231 Encounter for screening mammogram for malignant neoplasm of breast: Secondary | ICD-10-CM

## 2011-09-16 DIAGNOSIS — Z1159 Encounter for screening for other viral diseases: Secondary | ICD-10-CM | POA: Insufficient documentation

## 2011-09-20 ENCOUNTER — Encounter: Payer: Self-pay | Admitting: Family Medicine

## 2011-09-20 ENCOUNTER — Ambulatory Visit (INDEPENDENT_AMBULATORY_CARE_PROVIDER_SITE_OTHER): Payer: BC Managed Care – PPO | Admitting: Family Medicine

## 2011-09-20 VITALS — BP 112/80 | HR 94 | Temp 98.7°F | Ht 66.0 in | Wt 171.4 lb

## 2011-09-20 DIAGNOSIS — D229 Melanocytic nevi, unspecified: Secondary | ICD-10-CM

## 2011-09-20 DIAGNOSIS — Z23 Encounter for immunization: Secondary | ICD-10-CM

## 2011-09-20 DIAGNOSIS — Z0000 Encounter for general adult medical examination without abnormal findings: Secondary | ICD-10-CM

## 2011-09-20 DIAGNOSIS — Z111 Encounter for screening for respiratory tuberculosis: Secondary | ICD-10-CM

## 2011-09-20 DIAGNOSIS — D239 Other benign neoplasm of skin, unspecified: Secondary | ICD-10-CM

## 2011-09-20 NOTE — Progress Notes (Signed)
  Subjective:     Alisha Chen is a 45 y.o. female and is here for a comprehensive physical exam. The patient reports no problems.  History   Social History  . Marital Status: Married    Spouse Name: N/A    Number of Children: N/A  . Years of Education: N/A   Occupational History  . teacher    Social History Main Topics  . Smoking status: Never Smoker   . Smokeless tobacco: Not on file  . Alcohol Use: No  . Drug Use: No  . Sexually Active: Yes -- Female partner(s)   Other Topics Concern  . Not on file   Social History Narrative  . No narrative on file   Health Maintenance  Topic Date Due  . Mammogram  10/16/2008  . Influenza Vaccine  08/21/2012  . Pap Smear  09/15/2014  . Tetanus/tdap  09/19/2021    The following portions of the patient's history were reviewed and updated as appropriate: allergies, current medications, past family history, past medical history, past social history, past surgical history and problem list.  Review of Systems Review of Systems  Constitutional: Negative for activity change, appetite change and fatigue.  HENT: Negative for hearing loss, congestion, tinnitus and ear discharge.  dentist q60m Eyes: Negative for visual disturbance (see optho q1y -- vision corrected to 20/20 with glasses).  Respiratory: Negative for cough, chest tightness and shortness of breath.   Cardiovascular: Negative for chest pain, palpitations and leg swelling.  Gastrointestinal: Negative for abdominal pain, diarrhea, constipation and abdominal distention.  Genitourinary: Negative for urgency, frequency, decreased urine volume and difficulty urinating.  Musculoskeletal: Negative for back pain, arthralgias and gait problem.  Skin: Negative for color change, pallor and rash.  Neurological: Negative for dizziness, light-headedness, numbness and headaches.  Hematological: Negative for adenopathy. Does not bruise/bleed easily.  Psychiatric/Behavioral: Negative for suicidal  ideas, confusion, sleep disturbance, self-injury, dysphoric mood, decreased concentration and agitation.       Objective:    BP 112/80  Pulse 94  Temp(Src) 98.7 F (37.1 C) (Oral)  Ht 5\' 6"  (1.676 m)  Wt 171 lb 6.4 oz (77.747 kg)  BMI 27.66 kg/m2  SpO2 99% General appearance: alert, cooperative, appears stated age and no distress Head: Normocephalic, without obvious abnormality, atraumatic Eyes: conjunctivae/corneas clear. PERRL, EOM's intact. Fundi benign. Ears: normal TM's and external ear canals both ears Nose: Nares normal. Septum midline. Mucosa normal. No drainage or sinus tenderness. Throat: lips, mucosa, and tongue normal; teeth and gums normal Neck: no adenopathy, no carotid bruit, no JVD, supple, symmetrical, trachea midline and thyroid not enlarged, symmetric, no tenderness/mass/nodules Back: symmetric, no curvature. ROM normal. No CVA tenderness. Lungs: clear to auscultation bilaterally Breasts: gyn Heart: regular rate and rhythm, S1, S2 normal, no murmur, click, rub or gallop Abdomen: soft, non-tender; bowel sounds normal; no masses,  no organomegaly Pelvic: gyn Extremities: extremities normal, atraumatic, no cyanosis or edema Pulses: 2+ and symmetric Skin: Skin color, texture, turgor normal. No rashes or lesions Lymph nodes: Cervical, supraclavicular, and axillary nodes normal. Neurologic: Alert and oriented X 3, normal strength and tone. Normal symmetric reflexes. Normal coordination and gait psych---  no anxiety / depression    Assessment:    Healthy female exam.      Plan:    ghm utd See After Visit Summary for Counseling Recommendations

## 2011-09-20 NOTE — Patient Instructions (Signed)

## 2011-09-21 LAB — BASIC METABOLIC PANEL
CO2: 24 mEq/L (ref 19–32)
GFR: 87.68 mL/min (ref >60.00)
Glucose, Bld: 68 mg/dL — ABNORMAL LOW (ref 70–99)
Potassium: 4.5 mEq/L (ref 3.5–5.1)
Sodium: 135 mEq/L (ref 135–145)

## 2011-09-21 LAB — CBC WITH DIFFERENTIAL/PLATELET
Basophils Absolute: 0 10*3/uL (ref 0.0–0.1)
Basophils Relative: 0.6 % (ref 0.0–3.0)
HCT: 29.9 % — ABNORMAL LOW (ref 36.0–46.0)
Hemoglobin: 9.5 g/dL — ABNORMAL LOW (ref 12.0–15.0)
Lymphs Abs: 2 10*3/uL (ref 0.7–4.0)
Monocytes Relative: 9.4 % (ref 3.0–12.0)
Neutro Abs: 2.4 10*3/uL (ref 1.4–7.7)
RBC: 3.93 Mil/uL (ref 3.87–5.11)
RDW: 16.4 % — ABNORMAL HIGH (ref 11.5–14.6)

## 2011-09-21 LAB — LIPID PANEL
Cholesterol: 158 mg/dL (ref 0–200)
VLDL: 6.2 mg/dL (ref 0.0–40.0)

## 2011-09-21 LAB — HEPATIC FUNCTION PANEL
ALT: 17 U/L (ref 0–35)
AST: 21 U/L (ref 0–37)
Albumin: 4 g/dL (ref 3.5–5.2)
Alkaline Phosphatase: 71 U/L (ref 39–117)
Total Protein: 7.5 g/dL (ref 6.0–8.3)

## 2011-09-21 LAB — MUMPS ANTIBODY, IGG: Mumps IgG: 1.21 {ISR} — ABNORMAL HIGH

## 2011-09-21 LAB — RUBEOLA ANTIBODY IGG: Rubeola IgG: 3.19 {ISR} — ABNORMAL HIGH

## 2011-09-22 LAB — TB SKIN TEST: Induration: 0

## 2011-10-11 ENCOUNTER — Ambulatory Visit (INDEPENDENT_AMBULATORY_CARE_PROVIDER_SITE_OTHER): Payer: BC Managed Care – PPO | Admitting: *Deleted

## 2011-10-11 VITALS — Temp 97.6°F

## 2011-10-11 DIAGNOSIS — Z23 Encounter for immunization: Secondary | ICD-10-CM

## 2011-10-11 DIAGNOSIS — Z0000 Encounter for general adult medical examination without abnormal findings: Secondary | ICD-10-CM

## 2011-10-12 ENCOUNTER — Ambulatory Visit (HOSPITAL_COMMUNITY)
Admission: RE | Admit: 2011-10-12 | Discharge: 2011-10-12 | Disposition: A | Discharge status: Home / Self Care | Payer: BC Managed Care – PPO | Source: Ambulatory Visit | Attending: Obstetrics and Gynecology | Admitting: Obstetrics and Gynecology

## 2011-10-12 DIAGNOSIS — Z1231 Encounter for screening mammogram for malignant neoplasm of breast: Secondary | ICD-10-CM | POA: Insufficient documentation

## 2011-10-17 ENCOUNTER — Other Ambulatory Visit: Payer: Self-pay | Admitting: Obstetrics and Gynecology

## 2011-10-17 DIAGNOSIS — R928 Other abnormal and inconclusive findings on diagnostic imaging of breast: Secondary | ICD-10-CM

## 2011-12-12 ENCOUNTER — Ambulatory Visit
Admission: RE | Admit: 2011-12-12 | Discharge: 2011-12-12 | Disposition: A | Discharge status: Home / Self Care | Payer: BC Managed Care – PPO | Source: Ambulatory Visit | Attending: Obstetrics and Gynecology | Admitting: Obstetrics and Gynecology

## 2011-12-12 ENCOUNTER — Other Ambulatory Visit: Payer: Self-pay | Admitting: Obstetrics and Gynecology

## 2011-12-12 DIAGNOSIS — R928 Other abnormal and inconclusive findings on diagnostic imaging of breast: Secondary | ICD-10-CM

## 2011-12-12 DIAGNOSIS — R921 Mammographic calcification found on diagnostic imaging of breast: Secondary | ICD-10-CM

## 2012-01-09 ENCOUNTER — Other Ambulatory Visit: Payer: Self-pay | Admitting: Diagnostic Radiology

## 2012-01-09 ENCOUNTER — Ambulatory Visit
Admission: RE | Admit: 2012-01-09 | Discharge: 2012-01-09 | Disposition: A | Discharge status: Home / Self Care | Payer: BC Managed Care – PPO | Source: Ambulatory Visit | Attending: Obstetrics and Gynecology | Admitting: Obstetrics and Gynecology

## 2012-01-09 DIAGNOSIS — R921 Mammographic calcification found on diagnostic imaging of breast: Secondary | ICD-10-CM

## 2012-01-09 HISTORY — PX: OTHER SURGICAL HISTORY: SHX169

## 2012-01-09 HISTORY — PX: BREAST BIOPSY: SHX20

## 2012-01-10 ENCOUNTER — Other Ambulatory Visit: Payer: Self-pay | Admitting: Obstetrics and Gynecology

## 2012-01-10 DIAGNOSIS — C50919 Malignant neoplasm of unspecified site of unspecified female breast: Secondary | ICD-10-CM

## 2012-01-12 ENCOUNTER — Other Ambulatory Visit: Payer: Self-pay | Admitting: *Deleted

## 2012-01-12 ENCOUNTER — Other Ambulatory Visit: Payer: Self-pay | Admitting: Obstetrics and Gynecology

## 2012-01-12 ENCOUNTER — Ambulatory Visit
Admission: RE | Admit: 2012-01-12 | Discharge: 2012-01-12 | Disposition: A | Discharge status: Home / Self Care | Payer: BC Managed Care – PPO | Source: Ambulatory Visit | Attending: Obstetrics and Gynecology | Admitting: Obstetrics and Gynecology

## 2012-01-12 ENCOUNTER — Telehealth: Payer: Self-pay | Admitting: *Deleted

## 2012-01-12 DIAGNOSIS — R928 Other abnormal and inconclusive findings on diagnostic imaging of breast: Secondary | ICD-10-CM

## 2012-01-12 DIAGNOSIS — C50412 Malignant neoplasm of upper-outer quadrant of left female breast: Secondary | ICD-10-CM | POA: Insufficient documentation

## 2012-01-12 DIAGNOSIS — C50419 Malignant neoplasm of upper-outer quadrant of unspecified female breast: Secondary | ICD-10-CM

## 2012-01-12 DIAGNOSIS — C50919 Malignant neoplasm of unspecified site of unspecified female breast: Secondary | ICD-10-CM

## 2012-01-12 MED ORDER — GADOBENATE DIMEGLUMINE 529 MG/ML IV SOLN: 15.0000 mL | Freq: Once | INTRAVENOUS | Status: AC | PRN

## 2012-01-12 NOTE — Telephone Encounter (Signed)
Confirmed [facility] for 01/18/12 at 1200 .  Instructions and contact information given.  

## 2012-01-13 ENCOUNTER — Ambulatory Visit
Admission: RE | Admit: 2012-01-13 | Discharge: 2012-01-13 | Disposition: A | Discharge status: Home / Self Care | Payer: BC Managed Care – PPO | Source: Ambulatory Visit | Attending: Obstetrics and Gynecology | Admitting: Obstetrics and Gynecology

## 2012-01-13 ENCOUNTER — Other Ambulatory Visit: Payer: Self-pay | Admitting: Obstetrics and Gynecology

## 2012-01-13 DIAGNOSIS — R928 Other abnormal and inconclusive findings on diagnostic imaging of breast: Secondary | ICD-10-CM

## 2012-01-18 ENCOUNTER — Ambulatory Visit (HOSPITAL_BASED_OUTPATIENT_CLINIC_OR_DEPARTMENT_OTHER): Payer: BC Managed Care – PPO | Admitting: Oncology

## 2012-01-18 ENCOUNTER — Encounter: Payer: Self-pay | Admitting: Radiation Oncology

## 2012-01-18 ENCOUNTER — Encounter: Payer: Self-pay | Admitting: Oncology

## 2012-01-18 ENCOUNTER — Ambulatory Visit (HOSPITAL_BASED_OUTPATIENT_CLINIC_OR_DEPARTMENT_OTHER): Payer: BC Managed Care – PPO | Admitting: General Surgery

## 2012-01-18 ENCOUNTER — Encounter: Payer: Self-pay | Admitting: *Deleted

## 2012-01-18 ENCOUNTER — Ambulatory Visit: Payer: BC Managed Care – PPO

## 2012-01-18 ENCOUNTER — Ambulatory Visit: Payer: BC Managed Care – PPO | Attending: General Surgery | Admitting: Physical Therapy

## 2012-01-18 ENCOUNTER — Telehealth: Payer: Self-pay | Admitting: Oncology

## 2012-01-18 ENCOUNTER — Other Ambulatory Visit: Payer: BC Managed Care – PPO | Admitting: Lab

## 2012-01-18 ENCOUNTER — Encounter (INDEPENDENT_AMBULATORY_CARE_PROVIDER_SITE_OTHER): Payer: Self-pay | Admitting: General Surgery

## 2012-01-18 ENCOUNTER — Ambulatory Visit
Admission: RE | Admit: 2012-01-18 | Discharge: 2012-01-18 | Disposition: A | Discharge status: Home / Self Care | Payer: BC Managed Care – PPO | Source: Ambulatory Visit | Attending: Radiation Oncology | Admitting: Radiation Oncology

## 2012-01-18 VITALS — BP 122/73 | HR 89 | Temp 98.6°F | Ht 66.0 in | Wt 173.4 lb

## 2012-01-18 DIAGNOSIS — C50419 Malignant neoplasm of upper-outer quadrant of unspecified female breast: Secondary | ICD-10-CM

## 2012-01-18 DIAGNOSIS — Z170 Estrogen receptor positive status [ER+]: Secondary | ICD-10-CM

## 2012-01-18 DIAGNOSIS — R293 Abnormal posture: Secondary | ICD-10-CM | POA: Insufficient documentation

## 2012-01-18 DIAGNOSIS — IMO0001 Reserved for inherently not codable concepts without codable children: Secondary | ICD-10-CM | POA: Insufficient documentation

## 2012-01-18 LAB — CBC WITH DIFFERENTIAL/PLATELET
BASO%: 0.4 % (ref 0.0–2.0)
HCT: 31.1 % — ABNORMAL LOW (ref 34.8–46.6)
LYMPH%: 19.4 % (ref 14.0–49.7)
MCHC: 31.2 g/dL — ABNORMAL LOW (ref 31.5–36.0)
MONO#: 0.3 10*3/uL (ref 0.1–0.9)
NEUT%: 73.5 % (ref 38.4–76.8)
Platelets: 296 10*3/uL (ref 145–400)
WBC: 7.1 10*3/uL (ref 3.9–10.3)

## 2012-01-18 LAB — COMPREHENSIVE METABOLIC PANEL
AST: 15 U/L (ref 0–37)
BUN: 12 mg/dL (ref 6–23)
CO2: 27 mEq/L (ref 19–32)
Calcium: 10 mg/dL (ref 8.4–10.5)
Chloride: 104 mEq/L (ref 96–112)
Creatinine, Ser: 0.72 mg/dL (ref 0.50–1.10)

## 2012-01-18 LAB — TECHNOLOGIST REVIEW

## 2012-01-18 NOTE — Telephone Encounter (Signed)
gve the pt her aug 2013 appt calendar along with the ct scan/pet scan appt with instructions

## 2012-01-18 NOTE — Progress Notes (Addendum)
Patient ID: Alisha Chen, female   DOB: 10/22/1966, 45 y.o.   MRN: [id number]  Chief Complaint  Patient presents with  . Other    HPI Alisha Chen is a 45 y.o. female.  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  Magnification views show a cluster in the outer portion of the left breast posteriorly along with a second cluster near the axilla.  MRI shows 5 mm enhancing nodule along posterior margin of the stereotactic biopsy hematoma, suspicious 11 mm mass in the deep upper outer left breast near axillary tail that is 5.4 cm posterior to the stereotactic biopsy site.  There is also suspicious left axillary lymphadenopathy. 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  The first shows er positive at 96% and progesterone positive at 99%, Ki67 is 74%, and Her 2 is pending.  The other breast shows IDC with er 97%, pr 100% and Ki67  Is 48% and her 2 neu is not amplified.  A biopsy of the lymph node is also positive for metastatic ductal carcinoma.  She does not have complaints referable to her breasts.  She comes in today to discuss options for treatment of her breast cancer.  She does not have a family history of breast or ovarian cancer.  Past Medical History  Diagnosis Date  . Back pain   . Night sweats   . Hx: UTI (urinary tract infection)     Past Surgical History  Procedure Date  . Spine surgery 01/2011  . Foot surgery     multiple    Family History  Problem Relation Age of Onset  . Diabetes Mother     Social History History  Substance Use Topics  . Smoking status: Never Smoker   . Smokeless tobacco: Not on file  . Alcohol Use: No    Allergies  Allergen Reactions  . Shellfish-Derived Products Hives    Makes "me feel like I'm burning all over."  . Clarithromycin     REACTION:  NAUSEA,WEAK,BITTER TASTE  . Penicillins     Per pt's mom; pt doesn't remember.    Current Outpatient Prescriptions  Medication Sig Dispense Refill  . ibuprofen (ADVIL,MOTRIN) 200 MG tablet Take 400 mg by mouth as needed.        . pseudoephedrine (SUDAFED) 120 MG 12 hr tablet Take 120 mg by mouth every 12 (twelve) hours.        . pseudoephedrine-guaifenesin (TUSSIN PE) 30-100 MG/5ML SYRP Take 5 mLs by mouth every 4 (four) hours as needed.          Review of Systems Review of Systems  Constitutional: Negative for fever, chills and unexpected weight change.  HENT: Negative for hearing loss, congestion, sore throat, trouble swallowing and voice change.   Eyes: Negative for visual disturbance.  Respiratory: Negative for cough and wheezing.   Cardiovascular: Negative for chest pain, palpitations and leg swelling.  Gastrointestinal: Negative for nausea, vomiting, abdominal pain, diarrhea, constipation, blood in stool, abdominal distention and anal bleeding.  Genitourinary: Negative for hematuria, vaginal bleeding and difficulty urinating.  Musculoskeletal: Negative for arthralgias.  Skin: Negative for rash and wound.  Neurological: Negative for seizures, syncope and headaches.  Hematological: Negative for adenopathy. Does not bruise/bleed easily.  Psychiatric/Behavioral: Negative for confusion.    There were no vitals taken for this visit.  Physical   Exam Wt Readings from Last 3 Encounters:  01/18/12 173 lb 6.4 oz (78.654 kg)  09/20/11 171 lb 6.4 oz (77.747 kg)  09/24/10 170 lb 3.2 oz (77.202 kg)   Temp Readings from Last 3 Encounters:  01/18/12 98.6 F (37 C)   10/11/11 97.6 F (36.4 C) Oral  09/20/11 98.7 F (37.1 C) Oral   BP Readings from Last 3 Encounters:  01/18/12 122/73  09/20/11 112/80  09/24/10 114/76   Pulse Readings from Last 3 Encounters:  01/18/12 89  09/20/11 94  09/24/10 88    Physical Exam  Vitals reviewed. Constitutional: She appears well-developed  and well-nourished.  Neck: Neck supple.  Cardiovascular: Normal rate, regular rhythm and normal heart sounds.   Pulmonary/Chest: Effort normal. She has no wheezes. She has no rales. Right breast exhibits no inverted nipple, no mass, no nipple discharge, no skin change and no tenderness. Left breast exhibits mass (significant left outer breast hematoma). Left breast exhibits no inverted nipple, no nipple discharge, no skin change and no tenderness. Breasts are symmetrical.    Abdominal: Soft.  Lymphadenopathy:    She has no cervical adenopathy.    She has axillary adenopathy.       Left axillary: Lateral (mobile mildly enlarged node palpable ) adenopathy present.       Right: No supraclavicular adenopathy present.       Left: No supraclavicular adenopathy present.    Data Reviewed BILATERAL BREAST MRI WITH AND WITHOUT CONTRAST  Technique: Multiplanar, multisequence MR images of both breasts  were obtained prior to and following the intravenous administration  of 15ml of Multihance. Three dimensional images were evaluated at  the independent DynaCad workstation.  Comparison: Bilateral screening mammogram 10/12/2011, left  diagnostic mammogram 12/12/2011, and post stereotactic biopsy left  mammogram 01/09/2012.  Findings: There is a moderate, symmetric background enhancement  pattern bilaterally.  A small, 1.3 cm post-biopsy hematoma in the outer left breast, near  the junction of the middle and posterior thirds corresponds to the  recent stereotactic biopsy site. Along the posterior margin of the  biopsy cavity is a 5 mm nodular area of enhancement with washout  kinetics.  Posterior and slightly cephalad to the stereotactic biopsy site, in  the far posterior upper outer left breast, is a rim-enhancing  slightly irregular mass with washout kinetics. This mass measures  11 x 8 x 10 mm and is 5.4 cm posterior and slightly cephalad to the  biopsy site, when measured on the sagittal  image. This is  suspected to reflect the second site of calcifications with  associated nodular density seen on the screening mammogram of  November 2012 (on mammography, these two areas also measure  approximately 5.4 cm apart.)  No additional areas of concern are seen in the left breast. There  is no skin thickening.  No suspicious enhancement or mass is identified in the right breast  to suggest malignancy.  There is a suspicious level I left axillary lymph node, in the  inferior left axilla. The node is 1.6 x 1.0 cm in size,  heterogeneously enhancing, and has a thickened cortex. This lymph  node measures 1.6 x 1.0 cm axial dimensions. No right axillary or  internal mammary chain lymphadenopathy is identified.  IMPRESSION:  1. 5 mm enhancing nodule along the posterior margin of the  stereotactic biopsy hematoma in the outer left breast likely  reflects biopsy-proven malignancy.  2. Suspicious 11 mm mass in the deep upper outer left breast, near  the   axillary tail (5.4 cm posterior to the stereotactic biopsy  site). A second look ultrasound and possible biopsy is suggested  to evaluate extent of disease.  3. Suspicious left axillary lymphadenopathy. This can be further  evaluated at second look ultrasound, with possible biopsy.  4. No MRI evidence malignancy in the right breast.   Assessment    Locally advanced left breast cancer    Plan    Left breast wire guided bracketed lumpectomy, left alnd, port placement   We discussed today that would be reasonable to perform wire guided bracketed lumpectomy.  I think even though the distance is somewhat higher she has a large breast and this is her upper outer quadrant.  I think I can remove this area with a lumpectomy and two wires.  We talked extensively about positive margins and possibility of returning to operating room as 10-20%.  We also discussed alnd as she has positive nodes up front. We discussed port placement and risks of  bleeding, infection, pneumothorax.  We discussed the staging and pathophysiology of breast cancer. We discussed all of the different options for treatment for breast cancer including surgery, chemotherapy, radiation therapy, Herceptin, and antiestrogen therapy.  We discussed about a 15% risk lifetime of chronic shoulder pain as well as lymphedema associated with a sentinel lymph node biopsy.  We discussed the options for treatment of the breast cancer which included lumpectomy versus a mastectomy. We discussed the performance of the lumpectomy with a wire placement. We discussed a 10-20% chance of a positive margin requiring reexcision in the operating room. We also discussed that she may need radiation therapy or antiestrogen therapy or both if she undergoes lumpectomy. We discussed the mastectomy and the postoperative care for that as well. We discussed that there is no difference in her survival whether she undergoes lumpectomy with radiation therapy or antiestrogen therapy versus a mastectomy. There is a slight difference in the local recurrence rate being 3-5% with lumpectomy and about 1% with a mastectomy. We discussed the risks of operation including bleeding, infection, possible reoperation. She understands her further therapy will be based on what her stages at the time of her operation.  She is due to get genetic testing on Monday and will plan on scheduling tentatively for week of March 18.      Judyth Demarais 01/18/2012, 4:51 PM    

## 2012-01-18 NOTE — Progress Notes (Signed)
[facility] Radiation Oncology NEW PATIENT EVALUATION  Name: Alisha Chen MRN: [id number]  Date: 01/18/2012  DOB: [birth year]  Status: outpatient   CC: [name], DO, DO  Emelia Loron, MD    REFERRING PHYSICIAN: Emelia Loron, MD   DIAGNOSIS: [phone] upper outer quadrant left breast cancer, ER /  PR positive    HISTORY OF PRESENT ILLNESS:  Alisha Chen is a 46 y.o. female who is here today in breast clinic due to a new diagnosis of left breast cancer. On screening mammogram she was found to have calcifications in the left breast. On 10/12/2011. She underwent digital diagnostic mammography on 12/12/2011. This showed slightly pleomorphic calcifications in the left breast in 2 clusters of the upper outer quadrant. Stereotactic biopsy was scheduled for 01/09/2012. 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 It was grade 2. She went on to have him MRI performed on 01/12/2012. This revealed postbiopsy hematoma with a 5 mm enhancing nodule at the posterior margin of the hematoma in the left breast.  There was also a 11 mm mass in the deep upper-outer quadrant of the left breast near the axillary tail and biopsy was suggested. There was evidence of a suspicious 1 axillary lymph node that was 1.6 cm in size. She then on to undergo biopsy at the mean upper outer quadrant lesion that had been appreciated an MRI as well as a biopsy of this suspicious axillary lymph node. 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 The HER-2/neu status is pending. Axillary node was positive for carcinoma as well.  Therefore, it appears that this patient has multifocal T1 C. N1 M0 breast cancer. Otherwise, she is in her usual state of health.  PREVIOUS RADIATION THERAPY: No   PAST MEDICAL HISTORY:  has a past medical history of Back pain;  Night sweats; and UTI (urinary tract infection).  Her back pain was treated surgically over a year ago and resolved subsequently   PAST SURGICAL HISTORY:  Past Surgical History  Procedure Date  . Spine surgery 01/2011  . Foot surgery     multiple     FAMILY HISTORY: family history includes Diabetes in her mother.she has no known family history of breast or ovarian cancer.   SOCIAL HISTORY:  reports that she has never smoked. She does not have any smokeless tobacco history on file. She reports that she does not drink alcohol or use illicit drugs.   ALLERGIES: Shellfish-derived products; Clarithromycin; and Penicillins   MEDICATIONS:  Current Outpatient Prescriptions  Medication Sig Dispense Refill  . ibuprofen (ADVIL,MOTRIN) 200 MG tablet Take 400 mg by mouth as needed.        . pseudoephedrine (SUDAFED) 120 MG 12 hr tablet Take 120 mg by mouth every 12 (twelve) hours.        . pseudoephedrine-guaifenesin (TUSSIN PE) 30-100 MG/5ML SYRP Take 5 mLs by mouth every 4 (four) hours as needed.           REVIEW OF SYSTEMS:  A comprehensive 14 point review of systems is obtained, reviewed with the patient and placed in the patient chart.it is notable for that above.    PHYSICAL EXAM:  vitals were not taken for this visit.  General: Alert and oriented, in no acute distress HEENT: Head is normocephalic. Pupils are equally round and reactive to light. Extraocular movements are intact. Oropharynx  is clear. Neck: Neck is supple, no palpable cervical or supraclavicular lymphadenopathy. Heart: Regular in rate and rhythm with no murmurs, rubs, or gallops. Chest: Clear to auscultation bilaterally, with no rhonchi, wheezes, or rales. Abdomen: Soft, nontender, nondistended, with no rigidity or guarding. Extremities: No cyanosis or edema. Lymphatics: No concerning lymphadenopathy. Skin: No concerning lesions. Musculoskeletal: symmetric strength and muscle tone throughout. Neurologic: Cranial  nerves II through XII are grossly intact. No obvious focalities. Speech is fluent. Coordination is intact. Psychiatric: Judgment and insight are intact. Affect is appropriate. Breasts: Her breast exam is notable for a palpable axillary lymph node in the left axilla. This is approximately 1-1/2 cm in greatest dimension. It is not fixed.Otherwise I can appreciate ecchymoses over the left breast from her biopsy but I cannot appreciate any palpable tumor in either breast.  LABORATORY DATA:  Lab Results  Component Value Date   WBC 7.1 01/18/2012   HGB 9.7* 01/18/2012   HCT 31.1* 01/18/2012   MCV 87.1 01/18/2012   PLT 296 01/18/2012   Lab Results  Component Value Date   NA 136 01/18/2012   K 3.7 01/18/2012   CL 104 01/18/2012   CO2 27 01/18/2012   Lab Results  Component Value Date   ALT 12 01/18/2012   AST 15 01/18/2012   ALKPHOS 75 01/18/2012   BILITOT 0.2* 01/18/2012    PATHOLOGY: As above   RADIOLOGY: As above  IMPRESSION/PLAN: This is a very pleasant 46 year old woman with estrogen receptor positive multifocal T1 CN1M0 breast cancer. We discussed the patient in tumor board.she will be referred for genetic counseling. Dr. Dwain Sarna is going to attempt a lumpectomy, to clear both of the foci of disease. He will also perform an axillary lymph node dissection.I explained to the patient that there is a chance he will not be able to clear her disease adequately and she may need a mastectomy. In either case, I think that she is an excellent candidate for adjuvant radiotherapy due to her young age and positive lymph node status. She will need adjuvant chemotherapy and I explained to her that radiation will take place after she has completed her chemotherapy. Radiation will be delivered over about 6 weeks in total.  It was a pleasure meeting the patient today. We discussed the risks, benefits, and side effects of radiotherapy. No guarantees of treatment were given. I look forward to participating in the  patient's care. She and I will review the information that was conveyed today when I see her again in my clinic months from now for treatment planning.

## 2012-01-18 NOTE — Progress Notes (Signed)
MARIENE DICKERMAN [id number] 28-May-1966 46 y.o. 01/18/2012 4:52 PM  CC Dr. Emelia Loron Dr. Stephens November, DO, DO [phone] [facility] [address] 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 Tumor is ER positive PR positive. Patient is seen in the multidisciplinary breast clinic for discussion of her treatment options.  STAGE:  Cancer of upper-outer quadrant of female breast   Primary site: Breast (Left)   Staging method: AJCC 7th Edition   Clinical: Stage IIA (T1c, N1, cM0) signed by Buckner Malta, MD on 01/18/2012  3:37 PM   Summary: Stage IIA (T1c, N1, cM0)  REFERRING PHYSICIAN: Dr. [name]  HISTORY OF PRESENT ILLNESS:  SHAMECCA WHITEBREAD is a 46 y.o. female Without significant past medical history except for some back pain and history of previous UTIs. Patient was seen for a screening mammogramIn November 2012. She was found to have calcifications in the left breast. She was then scheduled for a diagnostic mammogram in January 2013. This showed pleomorphic health calcifications in 2 clusters in the upper outer quadrant. She had biopsies performed on 01/09/2012. The biopsy at the 3:00 position showed invasive ductal carcinoma with Pap micropapillary features with.total carcinoma in situ with calcifications. This was felt to be a grade 2 breast cancer. The tumor was estrogen receptor +97% progesterone receptor +100% proliferation marker 48% HER-2/neu negative. Patient also had biopsies performed at the 1:30 o'clock position about 13 cm from the nipple.2 showed invasive ductal carcinoma this was ER +96% PR +99% proliferation marker 74%. HER-2/neu is pending. Patient was also found to have a lymph node on radiology that was also biopsied and that was also positive for metastatic mammary carcinoma. On 01/12/2012 patient had bilateral MRIs of the breasts  performed. There was noted to be a small 1.3 cm postbiopsy hematoma in the outer left breast near junction of the middle and posterior thirds corresponding to the recent stereotactic biopsy. Along the posterior margin of the biopsy cavity there was a 5 mm nodular area of enhancement. The measurement mass measured 11 x 8 x 10 mm and was 5.4 cm a posterior and slightly cephalad to the post biopsy site. Patient is now seen in the multidisciplinary breast clinic for discussion of her treatment options.   Past Medical History: Past Medical History  Diagnosis Date  . Back pain   . Night sweats   . Hx: UTI (urinary tract infection)     Past Surgical History: Past Surgical History  Procedure Date  . Spine surgery 01/2011  . Foot surgery     multiple    Family History: Family History  Problem Relation Age of Onset  . 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 She's been married for 21 years she has one son who is 77 years old and is in the fourth grade. History  Substance Use Topics  . Smoking status: Never Smoker   . Smokeless tobacco: Not on file  . Alcohol Use: No    Allergies: Allergies  Allergen Reactions  . Shellfish-Derived Products Hives    Makes "me feel like I'm burning all over."  . Clarithromycin     REACTION: NAUSEA,WEAK,BITTER TASTE  . Penicillins     Per pt's mom; pt doesn't remember.    Current Medications: Current Outpatient Prescriptions  Medication Sig Dispense Refill  .  ibuprofen (ADVIL,MOTRIN) 200 MG tablet Take 400 mg by mouth as needed.        . pseudoephedrine (SUDAFED) 120 MG 12 hr tablet Take 120 mg by mouth every 12 (twelve) hours.        . pseudoephedrine-guaifenesin (TUSSIN PE) 30-100 MG/5ML SYRP Take 5 mLs by mouth every 4 (four) hours as needed.          OB/GYN History: Menarche at age 32 she is premenopausal her menstrual cycle started today. She is G1 P1  first pregnancy was at age 46.  She has completed her family.  Fertility Discussion: Patient has completed her family Prior History of Cancer: There is no previous history of cancers  Health Maintenance:  Colonoscopy Patient has never had a colonoscopy Bone Density No bone density is up-to-date Last PAP smear January 2013  ECOG PERFORMANCE STATUS: 0 - Asymptomatic  Genetic Counseling/testing: Patient it and I discussed genetic counseling and testing. Patient fits the NCCN Guidelines for testing for BRCA1 and 2 gene mutation  And this is recommended.  REVIEW OF SYSTEMS:  Constitutional: negative Ears, nose, mouth, throat, and face: negative Respiratory: negative Cardiovascular: negative Gastrointestinal: negative Genitourinary:negative Integument/breast: positive for breast lump and breast tenderness Hematologic/lymphatic: negative Musculoskeletal:negative Neurological: negative Behavioral/Psych: positive for anxiety  PHYSICAL EXAMINATION: Blood pressure 122/73, pulse 89, temperature 98.6 F (37 C), height 5\' 6"  (1.676 m), weight 173 lb 6.4 oz (78.654 kg).  ACZ:YSAYT, healthy, no distress, well nourished, well developed and anxious SKIN: skin color, texture, turgor are normal HEAD: No masses, lesions, tenderness or abnormalities EYES: PERRLA, EOMI, Conjunctiva are pink and non-injected, sclera clear EARS: External ears normal OROPHARYNX:no exudate, no erythema and lips, buccal mucosa, and tongue normal  NECK: no adenopathy, no bruits, no JVD, thyroid normal size, non-tender, without nodularity LYMPH:  no palpable lymphadenopathy, no hepatosplenomegaly BREAST:Left breast shows area of ecchymosis and palpable hematoma right breast no masses nipple discharge or skin change LUNGS: clear to auscultation and percussion HEART: regular rate & rhythm, no murmurs and no gallops ABDOMEN:abdomen soft, non-tender, normal bowel sounds and no masses or organomegaly BACK: Back symmetric, no curvature. EXTREMITIES:no  joint deformities, effusion, or inflammation, no edema, no clubbing, no cyanosis  NEURO: alert & oriented x 3 with fluent speech, no focal motor/sensory deficits, gait normal, reflexes normal and symmetric   STUDIES/RESULTS: US Breast Left  2012-01-28  *RADIOLOGY REPORT*  Clinical Data:  The patient returns for second look ultrasound following breast MRI.  She underwent stereotactic core needle biopsy on 01/09/2012 demonstrating invasive and in situ carcinoma. MRI demonstrated a small nodule associated with the stereotactic core needle biopsy site, a mass in the left upper outer quadrant posteriorly and an abnormal left axillary lymph node.  LEFT BREAST ULTRASOUND  Comparison:  12/12/2011, 10/12/2011, 10/17/2007  On physical exam, no mass is palpated in the left upper outer quadrant of the left axilla.  The biopsy site in the left upper outer quadrant demonstrates mild ecchymosis.  Findings: Ultrasound is performed, showing an irregular hypoechoic mass at 1:30 o'clock, 13 cm from the left nipple measuring approximately 1.0 x 1.2 x 0.9 cm.  Sonography of the left axilla demonstrates an abnormal left axillary node with no echogenic hilum, measuring 2.0 x 1.1 x 1.4 cm. These findings correspond to the MRI findings.  The appearance is suspicious for invasive mammary carcinoma and axillary metastasis.  Ultrasound-guided core needle biopsy will be performed and reported separately.  IMPRESSION: Irregular hypoechoic mass at 1:30 o'clock, 13 cm from the left  nipple and abnormal left axillary lymph node.  Findings is suspicious for invasive mammary carcinoma and axillary metastasis. Ultrasound-guided core needle biopsy is suggested.  This will be performed and reported separately.  BI-RADS CATEGORY 5:  Highly suggestive of malignancy - appropriate action should be taken.  Original Report Authenticated By: Daryl Eastern, M.D.   Mr Breast Bilateral W Wo Contrast  01/12/2012  *RADIOLOGY REPORT*  Clinical Data:  Stereotactic biopsy of 1 of 2 clusters of calcifications in the upper outer    left breast was recently performed on 01/09/2012.  Pathology results show invasive ductal carcinoma with micropapillary features and DCIS with calcifications.  BILATERAL BREAST MRI WITH AND WITHOUT CONTRAST  Technique: Multiplanar, multisequence MR images of both breasts were obtained prior to and following the intravenous administration of 15ml of Multihance.  Three dimensional images were evaluated at the independent DynaCad workstation.  Comparison:  Bilateral screening mammogram 10/12/2011, left diagnostic mammogram 12/12/2011, and post stereotactic biopsy left mammogram 01/09/2012.  Findings: There is a moderate, symmetric background enhancement pattern bilaterally.  A small, 1.3 cm post-biopsy hematoma in the outer left breast, near the junction of the middle and posterior thirds corresponds to the recent stereotactic biopsy site.  Along the posterior margin of the biopsy cavity is a 5 mm nodular area of enhancement with washout kinetics.  Posterior and slightly cephalad to the stereotactic biopsy site, in the far posterior upper outer left breast, is a rim-enhancing slightly irregular  mass with washout kinetics.  This mass measures 11 x 8 x 10 mm and is 5.4 cm posterior and slightly cephalad to the biopsy site, when measured on the sagittal image.  This is suspected to reflect the second site of calcifications with associated nodular density seen on the screening mammogram of November 2012 (on mammography, these two areas also measure approximately 5.4 cm apart.)  No additional areas of concern are seen in the left breast. There is no skin thickening.  No suspicious enhancement or mass is identified in the right breast to suggest malignancy.  There is a suspicious level I left axillary lymph node, in the inferior left axilla. The node is 1.6 x 1.0 cm in size, heterogeneously enhancing, and has a thickened cortex.  This lymph node  measures 1.6 x 1.0 cm axial dimensions.  No right axillary or internal mammary chain lymphadenopathy is identified.  IMPRESSION:  1.  5 mm enhancing nodule along the posterior margin of the stereotactic biopsy hematoma in the outer left breast likely reflects biopsy-proven malignancy.  2.  Suspicious 11 mm mass in the deep upper outer left breast, near the axillary tail (5.4 cm posterior to the stereotactic biopsy site).  A second look ultrasound and possible biopsy is suggested to evaluate extent of disease.  3.  Suspicious left axillary lymphadenopathy.  This can be further evaluated at second look ultrasound, with possible biopsy.  4.  No MRI evidence malignancy in the right breast.  The patient will be contacted for a second look ultrasound of the left breast and left axilla.  THREE-DIMENSIONAL MR IMAGE RENDERING ON INDEPENDENT WORKSTATION:  Three-dimensional MR images were rendered by post-processing of the original MR data on an independent workstation.  The three- dimensional MR images were interpreted, and findings were reported in the accompanying complete MRI report for this study.  BI-RADS CATEGORY 4:  Suspicious abnormality - biopsy should be considered.  Original Report Authenticated By: Britta Mccreedy, M.D.   [place] Core Biopsy  01/16/2012  *RADIOLOGY REPORT*  Clinical  Data:  Abnormal left axillary lymph node  ULTRASOUND GUIDED CORE BIOPSY OF THE LEFT AXILLA  The patient and I discussed the procedure of ultrasound-guided biopsy, including benefits and alternatives.  We discussed the high likelihood of a successful procedure. We discussed the risks of the procedure, including infection, bleeding, tissue injury, clip migration and inadequate sampling.  Informed written consent was given.  Using sterile technique, 2% lidocaine, ultrasound guidance and a 14 gauge automated biopsy device, biopsy was performed of the abnormal left axillary lymph node.  Histologic evaluation demonstrates invasive ductal  carcinoma with axillary metastasis.  This is concordant with the imaging findings. Results were discussed with the patient by telephone at her request.  The patient is scheduled to be seen in the [facility] on 01/18/2012.  She reports no complications from the procedure.  IMPRESSION:  Ultrasound guided biopsy of an abnormal left axillary lymph node. Invasive ductal carcinoma with axillary metastasis is diagnosed. The patient is scheduled to be seen in the [facility] on 01/18/2012.  No apparent complications.  Original Report Authenticated By: Daryl Eastern, M.D.   [place] Core Biopsy  01/16/2012  *RADIOLOGY REPORT*  Clinical Data:  Irregular hypoechoic mass at 1:30 o'clock, 13 cm from the left nipple.  ULTRASOUND GUIDED VACUUM ASSISTED CORE BIOPSY OF THE LEFT BREAST  The patient and I discussed the procedure of ultrasound-guided biopsy, including benefits and alternatives.  We discussed the high likelihood of a successful procedure. We discussed the risks of the procedure, including infection, bleeding, tissue injury, clip migration and inadequate sampling.  Informed written consent was given.  Using sterile technique, 2% lidocaine, ultrasound guidance and a 12 gauge vacuum assisted needle, biopsy was performed of the mass at 1:30 o'clock, 13 cm from the left nipple.  At the conclusion of the procedure, a ribbon tissue marker clip was deployed into the biopsy cavity.  Follow-up 2-view mammogram was performed and dictated separately.  Histologic evaluation demonstrates invasive ductal carcinoma with axillary metastasis.  This is concordant with the imaging findings. Results were discussed with the patient by telephone at her request.  The patient is scheduled to be seen in the [facility] on 01/18/2012.  She reports no complications from the procedure.  IMPRESSION: Ultrasound-guided biopsy of a mass at 1:30  o'clock, 13 cm from the left nipple. Invasive ductal carcinoma with axillary metastasis is diagnosed.  The patient is scheduled to be seen in the [facility] on 01/18/2012.  No apparent complications.  Original Report Authenticated By: Daryl Eastern, M.D.   Mm Breast Stereo Biopsy Left  01/10/2012  *RADIOLOGY REPORT*  Clinical Data:  Calcifications in the 3 o'clock position of the left breast.  STEREOTACTIC-GUIDED VACUUM ASSISTED BIOPSY OF THE LEFT BREAST AND SPECIMEN RADIOGRAPH  The patient and I discussed the procedure of stereotactic-guided biopsy, including benefits and alternatives.  We discussed the high likelihood of a successful procedure. We discussed the risks of the procedure, including infection, bleeding, tissue injury, clip migration and inadequate sampling.  Informed written consent was given.  Using sterile technique, 2% lidocaine, stereotactic guidance and a 9 gauge vacuum assisted device, biopsy was performed of the calcifications at 3 o'clock in the left breast.  Specimen radiograph was performed, showing multiple calcifications within one core specimen.  Specimens with calcifications are identified for pathology.  At the conclusion of the procedure, a top hat shaped tissue marker clip was deployed into the biopsy cavity.  Follow-up 2-view mammogram confirmed clip placement and removal of the calcifications.  Histologic evaluation demonstrates invasive ductal carcinoma with micropapillary features, likely grade II/III.  There is also ductal carcinoma in situ with calcifications.  Findings are concordant with the imaging findings.  Results were discussed with the patient by telephone at her request.  Breast MRI is scheduled for 01/12/2012.  The patient is scheduled to be seen in the [facility] on 01/18/2012.  The patient reports no complications from the procedure.  IMPRESSION: Stereotactic-guided biopsy of calcifications  in the 3 o'clock position of the left breast. Ductal carcinoma in situ with calcifications and invasive ductal carcinoma with micropapillary features is diagnosed.  Breast MRI and [facility] scheduled.  No apparent complications.  Original Report Authenticated By: Daryl Eastern, M.D.   Mm Breast Surgical Specimen  01/10/2012  *RADIOLOGY REPORT*  Clinical Data:  Calcifications in the 3 o'clock position of the left breast.  STEREOTACTIC-GUIDED VACUUM ASSISTED BIOPSY OF THE LEFT BREAST AND SPECIMEN RADIOGRAPH  The patient and I discussed the procedure of stereotactic-guided biopsy, including benefits and alternatives.  We discussed the high likelihood of a successful procedure. We discussed the risks of the procedure, including infection, bleeding, tissue injury, clip migration and inadequate sampling.  Informed written consent was given.  Using sterile technique, 2% lidocaine, stereotactic guidance and a 9 gauge vacuum assisted device, biopsy was performed of the calcifications at 3 o'clock in the left breast.  Specimen radiograph was performed, showing multiple calcifications within one core specimen.  Specimens with calcifications are identified for pathology.  At the conclusion of the procedure, a top hat shaped tissue marker clip was deployed into the biopsy cavity.  Follow-up 2-view mammogram confirmed clip placement and removal of the calcifications.  Histologic evaluation demonstrates invasive ductal carcinoma with micropapillary features, likely grade II/III.  There is also ductal carcinoma in situ with calcifications.  Findings are concordant with the imaging findings.  Results were discussed with the patient by telephone at her request.  Breast MRI is scheduled for 01/12/2012.  The patient is scheduled to be seen in the [facility] on 01/18/2012.  The patient reports no complications from the procedure.  IMPRESSION:  Stereotactic-guided biopsy of calcifications in the 3 o'clock position of the left breast. Ductal carcinoma in situ with calcifications and invasive ductal carcinoma with micropapillary features is diagnosed.  Breast MRI and [facility] scheduled.  No apparent complications.  Original Report Authenticated By: Daryl Eastern, M.D.   Mm Digital Diagnostic Unilat L  01/13/2012  *RADIOLOGY REPORT*  Clinical Data:  Ultrasound-guided core needle biopsy of a mass at 1:30 o'clock, 13 cm from the left nipple with clip placement.  DIGITAL DIAGNOSTIC LEFT MAMMOGRAM  Comparison:  None.  Findings:  Films are performed following ultrasound guided biopsy of a mass at 1:30 o'clock 13 cm from the left nipple.  The InRad ribbon clip is appropriately positioned. The ribbon clip is approximately 4.1 cm from the top hat shaped clip placed during the stereotactic biopsy on 01/09/2012.  IMPRESSION: Appropriate clip placement following ultrasound-guided core needle biopsy of a mass at 1:30 o'clock, 13 cm from the left nipple.  Original Report Authenticated By: Daryl Eastern, M.D.     LABS:    Chemistry      Component Value Date/Time   NA 136 01/18/2012 1222   K 3.7 01/18/2012 1222   CL 104 01/18/2012 1222   CO2 27  01/18/2012 1222   BUN 12 01/18/2012 1222   CREATININE 0.72 01/18/2012 1222      Component Value Date/Time   CALCIUM 10.0 01/18/2012 1222   ALKPHOS 75 01/18/2012 1222   AST 15 01/18/2012 1222   ALT 12 01/18/2012 1222   BILITOT 0.2* 01/18/2012 1222      Lab Results  Component Value Date   WBC 7.1 01/18/2012   HGB 9.7* 01/18/2012   HCT 31.1* 01/18/2012   MCV 87.1 01/18/2012   PLT 296 01/18/2012       PATHOLOGY: ADDITIONAL INFORMATION: CHROMOGENIC IN-SITU HYBRIDIZATION Interpretation HER-2/NEU BY CISH - NO AMPLIFICATION OF HER-2 DETECTED. THE RATIO OF HER-2: CEP 17 SIGNALS WAS 1.13. Reference range: Ratio: HER2:CEP17 < 1.8 - gene amplification not  observed Ratio: HER2:CEP 17 1.8-2.2 - equivocal result Ratio: HER2:CEP17 > 2.2 - gene amplification observed Pecola Leisure MD Pathologist, Electronic Signature ( Signed 01/13/2012) PROGNOSTIC INDICATORS - ACIS Results IMMUNOHISTOCHEMICAL AND MORPHOMETRIC ANALYSIS BY THE AUTOMATED CELLULAR IMAGING SYSTEM (ACIS) This invasive carcinoma shows the following breast prognostic profile. Estrogen Receptor (Negative, <1%): 97%,POSITIVE, MODERATE STAINING INTENSITY Progesterone Receptor (Negative, <1%): 100%, POSITIVE,STRONG STAINING INTENSITY Proliferation Marker Ki67 by M IB-1 (Low<20%): 48% All controls stained appropriately Abigail Miyamoto MD Pathologist, Electronic Signature ( Signed 01/13/2012) 1 of 2 FINAL for GEOFFREY, MANKIN ([id number]) FINAL DIAGNOSIS Diagnosis Breast, left, needle core biopsy, ~3 o'clock, ~10 cm from nipple - INVASIVE DUCTAL CARCINOMA WITH MICROPAPILLARY FEATURES. - DUCTAL CARCINOMA IN SITU WITH CALCIFICATION PRESENT. - SEE COMMENT. Microscopic Comment Although definitive grading of breast carcinoma is best done one excision, the feature of the tumor from the left needle core biopsy in the 3 o'clock position, 10 cm from the nipple are compatible with a grade II to III breast carcinoma. Breast prognostic markers will be performed and reported in an addendum. The findings are called to [facility] on 01/10/12. Dr. Colonel Bald has seen this case in consultation with agreement. (RAH:gt, 01/10/12) Zandra Abts MD Pathologist, Electronic Signature (Case signed 01/10/2012) Specimen Gross and Clinical Information Specimen Comment Microcalcifications,   PROGNOSTIC INDICATORS - ACIS Results IMMUNOHISTOCHEMICAL AND MORPHOMETRIC ANALYSIS BY THE AUTOMATED CELLULAR IMAGING SYSTEM (ACIS) Estrogen Receptor (Negative, <1%): 96%, STRONG STAINING INTENSITY Progesterone Receptor (Negative, <1%): 99%, STRONG STAINING INTENSITY Proliferation Marker Ki67 by M  IB-1 (Low<20%): 74% All controls stained appropriately Pecola Leisure MD Pathologist, Electronic Signature ( Signed 01/18/2012) FINAL DIAGNOSIS Diagnosis 1. Breast, left, needle core biopsy, 1:30, 13 cm from nipple INVASIVE DUCTAL CARCINOMA. 2. Lymph node, needle/core biopsy, axilla METASTATIC MAMMARY CARCINOMA. Microscopic Comment 1. A breast prognostic profile will be performed and reported separately. Dr. Frederica Kuster has reviewed the case and agrees. Called to [facility] on 01-16-12. (JDP:mw 01-16-12) Jimmy Picket MD Pathologist, Electronic Signature (Case signed 01/16/2012) 1 of   ASSESSMENT    46 year old female with  #1 multifocal invasive ductal carcinoma with 2 foci of disease in the left breast. One measures about 1.2 cm and is ER positive PR positive HER-2/neu negative at the 3:00 position. Second focus measures less than a centimeter and is ER positive PR positive HER-2/neu is pending grade 3 with proliferation marker at 74%. Patient also has one right axillary lymph node that is positive for metastatic mammary carcinoma. Patient's clinical stage is stage IIA. She is seen in the multidisciplinary breast clinic for discussion of treatment options.  #2 history of back pain    PLAN:    #1 we discussed the diagnosis and pathology of breast  cancer at great detail. She was seen by Dr. Dwain Sarna as well as Dr. Doristine Devoid. Patient's case was discussed at the multidisciplinary conference and a lumpectomy with lymph node Sampling were recommended.  #2 I have discussed the role of adjuvant systemic chemotherapy with the patient. The rationale for adjuvant chemotherapy words discussed. I would plan on giving her FEC 100 x4 cycles in dose dense fashion and Taxotere given every 2 weeks for duration of 4 cycles. Recommendations are made based on NCCN guidelines for Stage II node positive breast cancer  #3 patient is recommended PET/CT for staging purposes to rule out any  distant disease.  #4 we also discussed genetic counseling and testing and a referral to genetic counselor has been made. Recommendations made per NCCN guidelines for women under 50 with breast cancer.    D Thank you so much for allowing me to participate in the care of JAYDE MCALLISTER. I will continue to follow up the patient with you and assist in her care.  All questions were answered. The patient knows to call the clinic with any problems, questions or concerns. We can certainly see the patient much sooner if necessary.  I spent 60 minutes counseling the patient face to face. The total time spent in the appointment was 60 minutes.  Drue Second, MD Medical/Oncology [facility] [phone] (beeper) [phone] (Office)  01/18/2012, 4:52 PM 01/18/2012, 4:52 PM

## 2012-01-18 NOTE — Patient Instructions (Addendum)
1. You will be set up to have genetic testing and counseling  2. PET/CT scheduled for staging purposes. 3. Return to see Dr. Welton Flakes in 6 weeks time .

## 2012-01-19 ENCOUNTER — Other Ambulatory Visit (INDEPENDENT_AMBULATORY_CARE_PROVIDER_SITE_OTHER): Payer: Self-pay | Admitting: General Surgery

## 2012-01-19 ENCOUNTER — Encounter: Payer: Self-pay | Admitting: *Deleted

## 2012-01-19 ENCOUNTER — Telehealth (INDEPENDENT_AMBULATORY_CARE_PROVIDER_SITE_OTHER): Payer: Self-pay

## 2012-01-19 ENCOUNTER — Telehealth: Payer: Self-pay | Admitting: *Deleted

## 2012-01-19 DIAGNOSIS — C50419 Malignant neoplasm of upper-outer quadrant of unspecified female breast: Secondary | ICD-10-CM

## 2012-01-19 NOTE — Telephone Encounter (Signed)
Called pt to talk to her about her sx and the approx. Time out of work. I advised pt that we normally tell patients that they are out of work for 2wks from the date of surgery. 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 It is always easier to request the full 2wks out and go back if pt decides to.

## 2012-01-19 NOTE — Telephone Encounter (Signed)
Gave pt negative Her2neu results from 2/22 bx.  Informed pt per Dr. Welton Flakes to have Mirena removed.  Received verbal understanding.  Encourage pt to call with needs. Contact information given.

## 2012-01-19 NOTE — Progress Notes (Signed)
Mailed after appt letter to pt. 

## 2012-01-20 ENCOUNTER — Encounter: Payer: Self-pay | Admitting: *Deleted

## 2012-01-20 ENCOUNTER — Telehealth: Payer: Self-pay | Admitting: Oncology

## 2012-01-20 NOTE — Telephone Encounter (Signed)
S/w the pt regarding her chemo educ class appt  on 02/02/2012

## 2012-01-23 ENCOUNTER — Ambulatory Visit (HOSPITAL_BASED_OUTPATIENT_CLINIC_OR_DEPARTMENT_OTHER): Payer: BC Managed Care – PPO | Admitting: Genetic Counselor

## 2012-01-23 ENCOUNTER — Ambulatory Visit: Payer: BC Managed Care – PPO | Admitting: Lab

## 2012-01-23 DIAGNOSIS — C50419 Malignant neoplasm of upper-outer quadrant of unspecified female breast: Secondary | ICD-10-CM

## 2012-01-23 NOTE — Progress Notes (Signed)
Dr.  Welton Flakes requested a consultation for genetic counseling and risk assessment for Alisha Chen, a 46 y.o. female, for discussion of her personal history of breast cancer. She presents to clinic today to discuss the possibility of a genetic predisposition to cancer, and to further clarify her risks, as well as her family members' risks for cancer.   HISTORY OF PRESENT ILLNESS: In 2013, at the age of 72, Alisha Chen was diagnosed with invasive ductal carcinoma of the left breast. This will be treated with surgery on 02/16/12.      Past Medical History  Diagnosis Date  . Back pain   . Hx: UTI (urinary tract infection)     Past Surgical History  Procedure Date  . Spine surgery 01/2011  . Foot surgery     multiple    History  Substance Use Topics  . Smoking status: Never Smoker   . Smokeless tobacco: Not on file  . Alcohol Use: No    REPRODUCTIVE HISTORY AND PERSONAL RISK ASSESSMENT FACTORS: Menarche was at age 70-12.   Premenopausal Uterus Intact: yes Ovaries Intact: yes G1P1A0 , first live birth at age 41  She not previously undergone treatment for infertility.   Never used OCPs   She has not used HRT in the past.    FAMILY HISTORY:  We obtained a detailed, 4-generation family history.  Significant diagnoses are listed below: Family History  Problem Relation Age of Onset  . Diabetes Mother    Alisha Chen has one son, four half brothers and three nieces and nephews.  Her mother died in her sleep from unknown reasons at age 13.  She has three maternal uncles and two maternal aunts, none of whom had cancer.  She is aware of a maternal cousin who underwent a bone marrow transplant for possible cancer and is doing well.  No other cancer is known on this side of the family.  Alisha Chen has no information about her father's side of the family.  Patient's maternal ancestors are of African American descent. There is no reported Ashkenazi Jewish ancestry.   GENETIC  COUNSELING RISK ASSESSMENT, DISCUSSION, AND SUGGESTED FOLLOW UP: We reviewed the natural history and genetic etiology of sporadic, familial and hereditary cancer syndromes.  The patient's personal history of breast cancer is suggestive of the following possible diagnosis: BRCA1 or BRCA2 mutation.  We discussed that identification of a hereditary cancer syndrome may help her care providers tailor the patients medical management. If a mutation indicating hereditary breast and ovarian cancer is detected in this case, the patient will be referred back to the referring provider and to any additional appropriate care providers to discuss the relevant options.   If a mutation is not found in the patient, this will decrease the likelihood of hereditary breast and ovarian cancer as the explanation for her breast cancer. Cancer surveillance options would be discussed for the patient according to the appropriate standard National Comprehensive Cancer Network and American Cancer Society guidelines, with consideration of their personal and family history risk factors. In this case, the patient will be referred back to their care providers for discussions of management.    After considering the risks, benefits, and limitations, the patient provided informed consent for  the following  testing:  BRACAnalysis with [facility] through [employer].   Per the patient's request, we will contact her by telephone to discuss these results. A follow up genetic counseling visit will be scheduled if indicated.  The patient  was seen for a total of 30 minutes, greater than 50% of which was spent face-to-face counseling.  This plan is being carried out per Dr. Milta Deiters recommendations.  This note will also be sent to the referring provider via the electronic medical record. The patient will be supplied with a summary of this genetic counseling discussion as well as educational information on the discussed hereditary cancer  syndromes following the conclusion of their visit.   Patient was discussed with Dr. Drue Second.    EP[facility] CC: Dr. Dwain Sarna Dr. Welton Flakes Dr. Basilio Cairo Dr. [name]  EDUCATIONAL INFORMATION SUPPLIED TO PATIENT AT ENCOUNTER:  Educational Brochure on Hereditary Breast and Ovarian Cancer by [facility]   _______________________________________________________________________ For Office Staff:  Number of people involved in session: 2 Was an Intern/ student involved with case: no

## 2012-01-24 ENCOUNTER — Other Ambulatory Visit: Payer: Self-pay | Admitting: Obstetrics and Gynecology

## 2012-01-24 ENCOUNTER — Telehealth: Payer: Self-pay | Admitting: *Deleted

## 2012-01-24 HISTORY — PX: INTRAUTERINE DEVICE REMOVAL: SUR1104

## 2012-01-24 NOTE — Progress Notes (Signed)
Addended by: Linard Millers on: 01/24/2012 02:25 PM   Modules accepted: Level of Service

## 2012-01-24 NOTE — Telephone Encounter (Signed)
Spoke with pt concerning [facility] from 2/27.  Reviewed with pt ER/PER, Her2neu status.  Confirmed scheduled appts with pt for CT/PET, chemo class, and f/u with Dr. Welton Flakes.  Pt relate she is having her Mirena removed today.  Pt denies further questions or concerns.  Encourage pt to call with needs.  Received verbal understanding.  Contact information given.

## 2012-01-27 ENCOUNTER — Encounter: Payer: Self-pay | Admitting: Genetic Counselor

## 2012-01-30 ENCOUNTER — Encounter (HOSPITAL_COMMUNITY)
Admission: RE | Admit: 2012-01-30 | Discharge: 2012-01-30 | Disposition: A | Discharge status: Home / Self Care | Payer: BC Managed Care – PPO | Source: Ambulatory Visit | Attending: Oncology | Admitting: Oncology

## 2012-01-30 DIAGNOSIS — M5137 Other intervertebral disc degeneration, lumbosacral region: Secondary | ICD-10-CM | POA: Insufficient documentation

## 2012-01-30 DIAGNOSIS — K7689 Other specified diseases of liver: Secondary | ICD-10-CM | POA: Insufficient documentation

## 2012-01-30 DIAGNOSIS — M51379 Other intervertebral disc degeneration, lumbosacral region without mention of lumbar back pain or lower extremity pain: Secondary | ICD-10-CM | POA: Insufficient documentation

## 2012-01-30 DIAGNOSIS — D259 Leiomyoma of uterus, unspecified: Secondary | ICD-10-CM | POA: Insufficient documentation

## 2012-01-30 DIAGNOSIS — N9489 Other specified conditions associated with female genital organs and menstrual cycle: Secondary | ICD-10-CM | POA: Insufficient documentation

## 2012-01-30 DIAGNOSIS — C50419 Malignant neoplasm of upper-outer quadrant of unspecified female breast: Secondary | ICD-10-CM

## 2012-01-30 DIAGNOSIS — C773 Secondary and unspecified malignant neoplasm of axilla and upper limb lymph nodes: Secondary | ICD-10-CM | POA: Insufficient documentation

## 2012-01-30 LAB — GLUCOSE, CAPILLARY: Glucose-Capillary: 76 mg/dL (ref 70–99)

## 2012-01-30 MED ORDER — IOHEXOL 300 MG/ML  SOLN
100.0000 mL | Freq: Once | INTRAMUSCULAR | Status: AC | PRN
  Administered 2012-01-30: 100 mL via INTRAVENOUS

## 2012-01-30 MED ORDER — FLUDEOXYGLUCOSE F - 18 (FDG) INJECTION
16.1000 | Freq: Once | INTRAVENOUS | Status: AC | PRN
  Administered 2012-01-30: 16.1 via INTRAVENOUS

## 2012-02-01 ENCOUNTER — Encounter: Payer: Self-pay | Admitting: Genetic Counselor

## 2012-02-02 ENCOUNTER — Other Ambulatory Visit: Payer: BC Managed Care – PPO

## 2012-02-02 ENCOUNTER — Telehealth: Payer: Self-pay | Admitting: *Deleted

## 2012-02-02 NOTE — Telephone Encounter (Signed)
Pt called [facility] regarding recent PET/CT Scan results. Per MD, notified pt " all in all it looks good, and it will be discussed in more detail at next appt visit. Confirmed with pt next appt on 02/17/12 lab at 2pm/MD at 2:30 Pt verbalized understanding Denied needing further assistance

## 2012-02-03 ENCOUNTER — Telehealth: Payer: Self-pay | Admitting: *Deleted

## 2012-02-03 NOTE — Telephone Encounter (Signed)
Reviewed with pt Per MD " All in all the scans look good, she will discuss in detail at pt's next office visit."  Talked about pt's treatments, she has concerns regarding "exactly how many are there going to be?'  Reviewed with pt what treatment plan is, however MD will re-evaluate her with each treatment  and if she feels there need to be changes she will make them at that time.  Pt asked if there was " any alternatives instead of chemo for this?"  Informed pt I will forward her concern to MD.

## 2012-02-06 ENCOUNTER — Encounter (HOSPITAL_BASED_OUTPATIENT_CLINIC_OR_DEPARTMENT_OTHER): Payer: Self-pay | Admitting: *Deleted

## 2012-02-09 ENCOUNTER — Encounter (HOSPITAL_BASED_OUTPATIENT_CLINIC_OR_DEPARTMENT_OTHER)
Admission: RE | Admit: 2012-02-09 | Discharge: 2012-02-09 | Disposition: A | Discharge status: Home / Self Care | Payer: BC Managed Care – PPO | Source: Ambulatory Visit | Attending: General Surgery | Admitting: General Surgery

## 2012-02-09 LAB — CBC
HCT: 35 % — ABNORMAL LOW (ref 36.0–46.0)
Hemoglobin: 11 g/dL — ABNORMAL LOW (ref 12.0–15.0)
MCHC: 31.4 g/dL (ref 30.0–36.0)
RBC: 4.06 MIL/uL (ref 3.87–5.11)
WBC: 4.4 10*3/uL (ref 4.0–10.5)

## 2012-02-10 ENCOUNTER — Ambulatory Visit (HOSPITAL_COMMUNITY): Payer: BC Managed Care – PPO

## 2012-02-10 ENCOUNTER — Ambulatory Visit
Admission: RE | Admit: 2012-02-10 | Discharge: 2012-02-10 | Disposition: A | Discharge status: Home / Self Care | Payer: BC Managed Care – PPO | Source: Ambulatory Visit | Attending: General Surgery | Admitting: General Surgery

## 2012-02-10 ENCOUNTER — Encounter (HOSPITAL_BASED_OUTPATIENT_CLINIC_OR_DEPARTMENT_OTHER): Payer: Self-pay | Admitting: Anesthesiology

## 2012-02-10 ENCOUNTER — Encounter (HOSPITAL_BASED_OUTPATIENT_CLINIC_OR_DEPARTMENT_OTHER)
Admission: RE | Disposition: A | Discharge status: Home / Self Care | Payer: Self-pay | Source: Ambulatory Visit | Attending: General Surgery

## 2012-02-10 ENCOUNTER — Ambulatory Visit (HOSPITAL_BASED_OUTPATIENT_CLINIC_OR_DEPARTMENT_OTHER): Payer: BC Managed Care – PPO | Admitting: Anesthesiology

## 2012-02-10 ENCOUNTER — Ambulatory Visit (HOSPITAL_BASED_OUTPATIENT_CLINIC_OR_DEPARTMENT_OTHER)
Admission: RE | Admit: 2012-02-10 | Discharge: 2012-02-11 | Disposition: A | Discharge status: Home / Self Care | Payer: BC Managed Care – PPO | Source: Ambulatory Visit | Attending: General Surgery | Admitting: General Surgery

## 2012-02-10 DIAGNOSIS — C50919 Malignant neoplasm of unspecified site of unspecified female breast: Secondary | ICD-10-CM

## 2012-02-10 DIAGNOSIS — C50419 Malignant neoplasm of upper-outer quadrant of unspecified female breast: Secondary | ICD-10-CM

## 2012-02-10 DIAGNOSIS — C773 Secondary and unspecified malignant neoplasm of axilla and upper limb lymph nodes: Secondary | ICD-10-CM | POA: Insufficient documentation

## 2012-02-10 DIAGNOSIS — Z01812 Encounter for preprocedural laboratory examination: Secondary | ICD-10-CM | POA: Insufficient documentation

## 2012-02-10 HISTORY — PX: BREAST LUMPECTOMY: SHX2

## 2012-02-10 HISTORY — PX: PORTACATH PLACEMENT: SHX2246

## 2012-02-10 SURGERY — BREAST LUMPECTOMY WITH NEEDLE LOCALIZATION AND AXILLARY LYMPH NODE DISSECTION
Anesthesia: General | Site: Chest | Laterality: Right | Wound class: Clean

## 2012-02-10 MED ORDER — EPHEDRINE SULFATE 50 MG/ML IJ SOLN
INTRAMUSCULAR | Status: DC | PRN
  Administered 2012-02-10: 10 mg via INTRAVENOUS

## 2012-02-10 MED ORDER — ACETAMINOPHEN 325 MG PO TABS: 650.0000 mg | ORAL_TABLET | Freq: Four times a day (QID) | ORAL | Status: DC | PRN

## 2012-02-10 MED ORDER — BACITRACIN ZINC 500 UNIT/GM EX OINT
TOPICAL_OINTMENT | CUTANEOUS | Status: DC | PRN
  Administered 2012-02-10: 1 via TOPICAL

## 2012-02-10 MED ORDER — CIPROFLOXACIN IN D5W 400 MG/200ML IV SOLN
400.0000 mg | INTRAVENOUS | Status: AC
  Administered 2012-02-10 (×2): 400 mg via INTRAVENOUS

## 2012-02-10 MED ORDER — LIDOCAINE HCL (CARDIAC) 20 MG/ML IV SOLN
INTRAVENOUS | Status: DC | PRN
  Administered 2012-02-10: 60 mg via INTRAVENOUS

## 2012-02-10 MED ORDER — PROPOFOL 10 MG/ML IV EMUL
INTRAVENOUS | Status: DC | PRN
  Administered 2012-02-10: 200 mg via INTRAVENOUS

## 2012-02-10 MED ORDER — ONDANSETRON HCL 4 MG/2ML IJ SOLN
INTRAMUSCULAR | Status: DC | PRN
  Administered 2012-02-10: 4 mg via INTRAVENOUS

## 2012-02-10 MED ORDER — ACETAMINOPHEN 650 MG RE SUPP: 650.0000 mg | Freq: Four times a day (QID) | RECTAL | Status: DC | PRN

## 2012-02-10 MED ORDER — ACETAMINOPHEN 10 MG/ML IV SOLN
1000.0000 mg | Freq: Once | INTRAVENOUS | Status: AC
  Administered 2012-02-10: 1000 mg via INTRAVENOUS

## 2012-02-10 MED ORDER — HYDROMORPHONE HCL PF 1 MG/ML IJ SOLN
0.2500 mg | INTRAMUSCULAR | Status: DC | PRN
  Administered 2012-02-10 (×3): 0.5 mg via INTRAVENOUS

## 2012-02-10 MED ORDER — HEPARIN (PORCINE) IN NACL 2-0.9 UNIT/ML-% IJ SOLN
INTRAMUSCULAR | Status: DC | PRN
  Administered 2012-02-10: 1 via INTRAVENOUS

## 2012-02-10 MED ORDER — FENTANYL CITRATE 0.05 MG/ML IJ SOLN
INTRAMUSCULAR | Status: DC | PRN
  Administered 2012-02-10 (×3): 25 ug via INTRAVENOUS
  Administered 2012-02-10: 100 ug via INTRAVENOUS
  Administered 2012-02-10: 25 ug via INTRAVENOUS

## 2012-02-10 MED ORDER — ONDANSETRON HCL 4 MG/2ML IJ SOLN: 4.0000 mg | Freq: Four times a day (QID) | INTRAMUSCULAR | Status: DC | PRN

## 2012-02-10 MED ORDER — LACTATED RINGERS IV SOLN
INTRAVENOUS | Status: DC
  Administered 2012-02-10 (×2): via INTRAVENOUS

## 2012-02-10 MED ORDER — DEXAMETHASONE SODIUM PHOSPHATE 4 MG/ML IJ SOLN
INTRAMUSCULAR | Status: DC | PRN
  Administered 2012-02-10: 10 mg via INTRAVENOUS

## 2012-02-10 MED ORDER — MORPHINE SULFATE 2 MG/ML IJ SOLN: 2.0000 mg | INTRAMUSCULAR | Status: DC | PRN

## 2012-02-10 MED ORDER — OXYCODONE HCL 5 MG PO TABS
5.0000 mg | ORAL_TABLET | ORAL | Status: DC | PRN
  Administered 2012-02-10 – 2012-02-11 (×3): 5 mg via ORAL

## 2012-02-10 MED ORDER — MIDAZOLAM HCL 5 MG/5ML IJ SOLN
INTRAMUSCULAR | Status: DC | PRN
  Administered 2012-02-10: 2 mg via INTRAVENOUS

## 2012-02-10 MED ORDER — SODIUM CHLORIDE 0.9 % IV SOLN
INTRAVENOUS | Status: DC
  Administered 2012-02-10: 13:00:00 via INTRAVENOUS

## 2012-02-10 MED ORDER — BUPIVACAINE HCL (PF) 0.25 % IJ SOLN
INTRAMUSCULAR | Status: DC | PRN
  Administered 2012-02-10: 17 mL

## 2012-02-10 MED ORDER — HEPARIN SOD (PORK) LOCK FLUSH 100 UNIT/ML IV SOLN
INTRAVENOUS | Status: DC | PRN
  Administered 2012-02-10: 500 [IU] via INTRAVENOUS

## 2012-02-10 MED ORDER — OXYCODONE-ACETAMINOPHEN 5-325 MG PO TABS: 1.0000 | ORAL_TABLET | Freq: Four times a day (QID) | ORAL | Status: DC | PRN

## 2012-02-10 SURGICAL SUPPLY — 76 items
ADH SKN CLS APL DERMABOND .7 (GAUZE/BANDAGES/DRESSINGS) ×2
APL SKNCLS STERI-STRIP NONHPOA (GAUZE/BANDAGES/DRESSINGS)
APPLIER CLIP 9.375 MED OPEN (MISCELLANEOUS) ×3
APR CLP MED 9.3 20 MLT OPN (MISCELLANEOUS) ×2
BAG DECANTER FOR FLEXI CONT (MISCELLANEOUS) ×3 IMPLANT
BENZOIN TINCTURE PRP APPL 2/3 (GAUZE/BANDAGES/DRESSINGS) IMPLANT
BINDER BREAST LRG (GAUZE/BANDAGES/DRESSINGS) ×1 IMPLANT
BLADE SURG 11 STRL SS (BLADE) ×3 IMPLANT
BLADE SURG 15 STRL LF DISP TIS (BLADE) ×2 IMPLANT
BLADE SURG 15 STRL SS (BLADE) ×6
CANISTER SUCTION 1200CC (MISCELLANEOUS) ×3 IMPLANT
CHLORAPREP W/TINT 26ML (MISCELLANEOUS) ×4 IMPLANT
CLIP APPLIE 9.375 MED OPEN (MISCELLANEOUS) ×2 IMPLANT
CLOTH BEACON ORANGE TIMEOUT ST (SAFETY) ×3 IMPLANT
COVER MAYO STAND STRL (DRAPES) ×3 IMPLANT
COVER TABLE BACK 60X90 (DRAPES) ×3 IMPLANT
DECANTER SPIKE VIAL GLASS SM (MISCELLANEOUS) IMPLANT
DERMABOND ADVANCED (GAUZE/BANDAGES/DRESSINGS) ×1
DERMABOND ADVANCED .7 DNX12 (GAUZE/BANDAGES/DRESSINGS) ×2 IMPLANT
DEVICE DUBIN W/COMP PLATE 8390 (MISCELLANEOUS) ×3 IMPLANT
DRAIN CHANNEL 19F RND (DRAIN) ×3 IMPLANT
DRAPE C-ARM 42X72 X-RAY (DRAPES) ×3 IMPLANT
DRAPE LAPAROSCOPIC ABDOMINAL (DRAPES) ×4 IMPLANT
DRSG TEGADERM 4X4.75 (GAUZE/BANDAGES/DRESSINGS) ×3 IMPLANT
ELECT COATED BLADE 2.86 ST (ELECTRODE) ×3 IMPLANT
ELECT REM PT RETURN 9FT ADLT (ELECTROSURGICAL) ×3
ELECTRODE REM PT RTRN 9FT ADLT (ELECTROSURGICAL) ×2 IMPLANT
EVACUATOR SILICONE 100CC (DRAIN) ×3 IMPLANT
GAUZE SPONGE 4X4 12PLY STRL LF (GAUZE/BANDAGES/DRESSINGS) ×3 IMPLANT
GAUZE SPONGE 4X4 16PLY XRAY LF (GAUZE/BANDAGES/DRESSINGS) ×1 IMPLANT
GLOVE BIO SURGEON STRL SZ7 (GLOVE) ×4 IMPLANT
GLOVE BIOGEL PI IND STRL 7.0 (GLOVE) IMPLANT
GLOVE BIOGEL PI IND STRL 7.5 (GLOVE) ×2 IMPLANT
GLOVE BIOGEL PI INDICATOR 7.0 (GLOVE) ×1
GLOVE BIOGEL PI INDICATOR 7.5 (GLOVE) ×2
GLOVE ECLIPSE 6.5 STRL STRAW (GLOVE) ×2 IMPLANT
GLOVE SKINSENSE NS SZ7.0 (GLOVE) ×1
GLOVE SKINSENSE STRL SZ7.0 (GLOVE) IMPLANT
GOWN PREVENTION PLUS XLARGE (GOWN DISPOSABLE) ×9 IMPLANT
IV HEPARIN 1000UNITS/500ML (IV SOLUTION) ×3 IMPLANT
IV KIT MINILOC 20X1 SAFETY (NEEDLE) IMPLANT
KIT MARKER MARGIN INK (KITS) ×3 IMPLANT
KIT POWER CATH 8FR (Catheter) ×1 IMPLANT
NDL HYPO 25X1 1.5 SAFETY (NEEDLE) ×2 IMPLANT
NDL SAFETY ECLIPSE 18X1.5 (NEEDLE) IMPLANT
NEEDLE HYPO 18GX1.5 SHARP (NEEDLE)
NEEDLE HYPO 25X1 1.5 SAFETY (NEEDLE) ×3 IMPLANT
NS IRRIG 1000ML POUR BTL (IV SOLUTION) ×3 IMPLANT
PACK BASIN DAY SURGERY FS (CUSTOM PROCEDURE TRAY) ×3 IMPLANT
PENCIL BUTTON HOLSTER BLD 10FT (ELECTRODE) ×3 IMPLANT
PIN SAFETY STERILE (MISCELLANEOUS) ×4 IMPLANT
SHEET MEDIUM DRAPE 40X70 STRL (DRAPES) ×3 IMPLANT
SLEEVE SCD COMPRESS KNEE MED (MISCELLANEOUS) ×3 IMPLANT
SPONGE LAP 4X18 X RAY DECT (DISPOSABLE) ×5 IMPLANT
STAPLER VISISTAT 35W (STAPLE) ×3 IMPLANT
STOCKINETTE IMPERVIOUS LG (DRAPES) ×3 IMPLANT
STRIP CLOSURE SKIN 1/2X4 (GAUZE/BANDAGES/DRESSINGS) IMPLANT
SUT ETHILON 2 0 FS 18 (SUTURE) ×3 IMPLANT
SUT MNCRL AB 4-0 PS2 18 (SUTURE) ×6 IMPLANT
SUT MON AB 4-0 PC3 18 (SUTURE) ×3 IMPLANT
SUT PROLENE 2 0 SH DA (SUTURE) ×4 IMPLANT
SUT SILK 2 0 SH (SUTURE) IMPLANT
SUT SILK 2 0 TIES 17X18 (SUTURE)
SUT SILK 2-0 18XBRD TIE BLK (SUTURE) IMPLANT
SUT VIC AB 2-0 SH 27 (SUTURE) ×6
SUT VIC AB 2-0 SH 27XBRD (SUTURE) ×2 IMPLANT
SUT VIC AB 3-0 SH 27 (SUTURE) ×9
SUT VIC AB 3-0 SH 27X BRD (SUTURE) ×2 IMPLANT
SUT VICRYL AB 3 0 TIES (SUTURE) ×1 IMPLANT
SYR 5ML LUER SLIP (SYRINGE) ×3 IMPLANT
SYR CONTROL 10ML LL (SYRINGE) ×3 IMPLANT
TOWEL OR 17X24 6PK STRL BLUE (TOWEL DISPOSABLE) ×4 IMPLANT
TOWEL OR NON WOVEN STRL DISP B (DISPOSABLE) ×3 IMPLANT
TUBE CONNECTING 20X1/4 (TUBING) ×3 IMPLANT
WATER STERILE IRR 1000ML POUR (IV SOLUTION) ×2 IMPLANT
YANKAUER SUCT BULB TIP NO VENT (SUCTIONS) ×3 IMPLANT

## 2012-02-10 NOTE — Anesthesia Preprocedure Evaluation (Signed)
Anesthesia Evaluation  Patient identified by MRN, date of birth, ID band Patient awake    Reviewed: Allergy & Precautions, H&P , NPO status , Patient's Chart, lab work & pertinent test results  Airway Mallampati: I TM Distance: >3 FB Neck ROM: Full    Dental No notable dental hx. (+) Teeth Intact   Pulmonary neg pulmonary ROS,    Pulmonary exam normal       Cardiovascular negative cardio ROS      Neuro/Psych negative neurological ROS  negative psych ROS   GI/Hepatic negative GI ROS, Neg liver ROS,   Endo/Other  negative endocrine ROS  Renal/GU negative Renal ROS  negative genitourinary   Musculoskeletal   Abdominal   Peds  Hematology negative hematology ROS (+)   Anesthesia Other Findings   Reproductive/Obstetrics negative OB ROS                           Anesthesia Physical Anesthesia Plan  ASA: II  Anesthesia Plan: General   Post-op Pain Management:    Induction: Intravenous  Airway Management Planned: LMA  Additional Equipment:   Intra-op Plan:   Post-operative Plan: Extubation in OR  Informed Consent: I have reviewed the patients History and Physical, chart, labs and discussed the procedure including the risks, benefits and alternatives for the proposed anesthesia with the patient or authorized representative who has indicated his/her understanding and acceptance.     Plan Discussed with: CRNA  Anesthesia Plan Comments:         Anesthesia Quick Evaluation  

## 2012-02-10 NOTE — Transfer of Care (Signed)
Immediate Anesthesia Transfer of Care Note  Patient: Alisha Chen  Procedure(s) Performed: Procedure(s) (LRB): BREAST LUMPECTOMY WITH NEDLE LOCALIZATION AND AXILLARY LYMPH NODE DISSECTION (Left) INSERTION PORT-A-CATH (Right)  Patient Location: PACU  Anesthesia Type: General  Level of Consciousness: awake, alert  and oriented  Airway & Oxygen Therapy: Patient Spontanous Breathing and Patient connected to face mask oxygen  Post-op Assessment: Report given to PACU RN and Post -op Vital signs reviewed and stable  Post vital signs: Reviewed and stable  Complications: No apparent anesthesia complications

## 2012-02-10 NOTE — Discharge Instructions (Signed)
[facility] Office Phone Number [phone]  BREAST BIOPSY/ PARTIAL MASTECTOMY: POST OP INSTRUCTIONS  Always review your discharge instruction sheet given to you by the facility where your surgery was performed.  IF YOU HAVE DISABILITY OR FAMILY LEAVE FORMS, YOU MUST BRING THEM TO THE OFFICE FOR PROCESSING.  DO NOT GIVE THEM TO YOUR DOCTOR.  1. A prescription for pain medication may be given to you upon discharge.  Take your pain medication as prescribed, if needed.  If narcotic pain medicine is not needed, then you may take acetaminophen (Tylenol), naprosyn (Alleve) or ibuprofen (Advil) as needed. 2. Take your usually prescribed medications unless otherwise directed 3. If you need a refill on your pain medication, please contact your pharmacy.  They will contact our office to request authorization.  Prescriptions will not be filled after 5pm or on week-ends. 4. You should eat very light the first 24 hours after surgery, such as soup, crackers, pudding, etc.  Resume your normal diet the day after surgery. 5. Most patients will experience some swelling and bruising in the breast.  Ice packs and a good support bra will help.  Wear the breast binder provided or a sports bra for 72 hours day and night.  After that wear a sports bra during the day until you return to the office. Swelling and bruising can take several days to resolve.  6. It is common to experience some constipation if taking pain medication after surgery.  Increasing fluid intake and taking a stool softener will usually help or prevent this problem from occurring.  A mild laxative (Milk of Magnesia or Miralax) should be taken according to package directions if there are no bowel movements after 48 hours. 7. Unless discharge instructions indicate otherwise, you may remove your bandages 48 hours after surgery and you may shower at that time.  You may have steri-strips (small skin tapes) in place directly over the incision.   These strips should be left on the skin for 7-10 days and will come off on their own.  If your surgeon used skin glue on the incision, you may shower in 24 hours.  The glue will flake off over the next 2-3 weeks.  Any sutures or staples will be removed at the office during your follow-up visit. 8. ACTIVITIES:  You may resume regular daily activities (gradually increasing) beginning the next day.  Wearing a good support bra or sports bra minimizes pain and swelling.  You may have sexual intercourse when it is comfortable. a. You may drive when you no longer are taking prescription pain medication, you can comfortably wear a seatbelt, and you can safely maneuver your car and apply brakes. b. RETURN TO WORK:  ______________________________________________________________________________________ 9. You should see your doctor in the office for a follow-up appointment approximately two weeks after your surgery.  Your doctor's nurse will typically make your follow-up appointment when she calls you with your pathology report.  Expect your pathology report 3-4 business days after your surgery.  You may call to check if you do not hear from [place] after three days. 10. OTHER INSTRUCTIONS: _______________________________________________________________________________________________ _____________________________________________________________________________________________________________________________________ _____________________________________________________________________________________________________________________________________ _____________________________________________________________________________________________________________________________________  WHEN TO CALL DR WAKEFIELD: 1. Fever over 101.0 2. Nausea and/or vomiting. 3. Extreme swelling or bruising. 4. Continued bleeding from incision. 5. Increased pain, redness, or drainage from the incision.  The clinic staff is available to  answer your questions during regular business hours.  Please don't hesitate to call and ask to speak to one of the nurses for  clinical concerns.  If you have a medical emergency, go to the nearest emergency room or call 911.  A surgeon from [facility] is always on call at the hospital.  For further questions, please visit [email or web address]   About my [name]-Pratt Bulb Drain  What is a [name]-Pratt bulb? A [name]-Pratt is a soft, round device used to collect drainage. It is connected to a long, thin drainage catheter, which is held in place by one or two small stiches near your surgical incision site. When the bulb is squeezed, it forms a vacuum, forcing the drainage to empty into the bulb.  Emptying the [name]-Pratt bulb- To empty the bulb: 1. Release the plug on the top of the bulb. 2. Pour the bulb's contents into a measuring container which your nurse will provide. 3. Record the time emptied and amount of drainage. Empty the drain(s) as often as your     doctor or nurse recommends.  Date                  Time                    Amount (Drain 1)                 Amount (Drain 2)  _____________________________________________________________________  _____________________________________________________________________  _____________________________________________________________________  _____________________________________________________________________  _____________________________________________________________________  _____________________________________________________________________  _____________________________________________________________________  _____________________________________________________________________  Squeezing the [name]-Pratt Bulb- To squeeze the bulb: 1. Make sure the plug at the top of the bulb is open. 2. Squeeze the bulb tightly in your fist. You will hear air squeezing from the bulb. 3. Replace the plug  while the bulb is squeezed. 4. Use a safety pin to attach the bulb to your clothing. This will keep the catheter from     pulling at the bulb insertion site.  When to call your doctor- Call your doctor if:  Drain site becomes red, swollen or hot.  You have a fever greater than 101 degrees F.  There is oozing at the drain site.  Drain falls out (apply a guaze bandage over the drain hole and secure it with tape).  Drainage increases daily not related to activity patterns. (You will usually have more drainage when you are active than when you are resting.)  Drainage has a bad odor.   [facility]  [address] [place], [place] [address] [phone]   Post Anesthesia Home Care Instructions  Activity: Get plenty of rest for the remainder of the day. A responsible adult should stay with you for 24 hours following the procedure.  For the next 24 hours, DO NOT: -Drive a car -Advertising copywriter -Drink alcoholic beverages -Take any medication unless instructed by your physician -Make any legal decisions or sign important papers.  Meals: Start with liquid foods such as gelatin or soup. Progress to regular foods as tolerated. Avoid greasy, spicy, heavy foods. If nausea and/or vomiting occur, drink only clear liquids until the nausea and/or vomiting subsides. Call your physician if vomiting continues.  Special Instructions/Symptoms: Your throat may feel dry or sore from the anesthesia or the breathing tube placed in your throat during surgery. If this causes discomfort, gargle with warm salt water. The discomfort should disappear within 24 hours.

## 2012-02-10 NOTE — Op Note (Signed)
Preoperative diagnosis: Clinical stage II left breast cancer Postoperative diagnosis: Same as above Procedure: #1 right subclavian power port insertion, MRI compatible #2 left breast bracketed lumpectomy #3 left axillary lymph node dissection  Surgeon: Dr. Harden Mo Anesthesia: Gen. Specimens: #1 left breast lumpectomy marked with paint kit #2 additional medial margin marked short superior long stitch medial double stitch deep #3 axillary contents, left  Estimated blood loss: 50 cc Drains: 19 French Blake drain to left axilla Complications: None Sponge and Correct x2 at end of operation Disposition to recovery in stable condition  Indications: This is a healthy 46 year old female who is known her screening mammogram to have 2 abnormalities. She underwent an MRI as well showing a large axillary nodes. She had 2 biopsies in the left upper outer quadrant of her breast showing invasive carcinoma as well as a lymph node biopsy showing metastatic mammary carcinoma. She was seen in the multidisciplinary clinic. She had genetic testing which was negative. We discussed all the options and I think she is amenable to a bracketed lumpectomy. She would like to try preserve her breast we scheduled her for a lumpectomy as well as an axillary lymph node dissection due to the nodes being positive. We also discussed a port placement at the same time.  Procedure: The patient first underwent wire placement at the breast center through both of the lesions. She was then brought to day surgery. She administered 400 mg of IV ciprofloxacin. Sequential compression devices were placed on her legs prior to beginning. She was then placed under general anesthesia without complication. I did the port first. I prepped her bilateral chest draped in the standard sterile surgical fashion. Surgical timeout was then performed.  I accessed her right subclavian vein on the first pass. The wire was placed. This was confirmed  by fluoroscopy. I made a pocket below this overlying the pectoralis fascia. I then tunneled the line between the 2 sites. I then placed the dilator through the clavipectoral fascia. I then placed a dilator and peel-away sheath and watched this go in under fluoroscopy. I then removed the wire assembly. The line was then placed and a peel-away sheath was removed. The line was then pulled back to the cavoatrial junction. The port was connected and sutured into place with 2-0 Prolene suture in 3 positions. The port aspirated blood and flushed easily. This was packed with heparin. Hemostasis was observed. I then closes with 3-0 Vicryl, 4-0 Monocryl and Dermabond.  We then broke down this set and then prepped and draped her left breast and her entire left arm. This was also draped in the standard sterile surgical fashion. I had her mammograms available for my review I was doing the procedure. I then made an elliptical incision and excised the portion of the skin of the upper-outer quadrant. I brought both of the wires in remotely from their insertion sites. I then used cautery to excise both wires as well as the surrounding breast tissue all the way down to and including the pectoralis fascia. Faxitron mammogram wasn't taken confirming removal of the both wires, both clips, and both masses. I thought that the anterior mass was a little bit close on the medial margins were I did remove some additional medial margin. This was confirmed by radiology. I obtained hemostasis. I then placed 2 clips and the pectoralis muscle. I placed a clip in each of the corner positions around the cavity. I then mobilized the breast tissue off the pectoralis muscle and close  this with 2-0 Vicryl, 3-0 Vicryl, and 4-0 Monocryl.  I then turned my attention to the axilla. I made an incision right lower axillary hairline. I carried this through the axillary fascia. Her axillary vein, long thoracic nerve, thoracodorsal bundle were all  identified. Both nerves were functional upon completion of the case. I then removed all of the lymphatic tissue down to the muscle from this space. This was swept caudad off the axillary vein and multiple clips were used on some draining lymphatics. There was a single enlarged node that I identified.This was all then passed off the table as a specimen. Hemostasis was observed. I placed a 56 [place] Blake drain in her left axilla. This was secured with a 2-0 nylon suture. I then close x-ray fashion with 2-0 Vicryl. The remaining incisions closed with 3-0 Vicryl for Monocryl. Steri-Strips and sterile dressing were placed on both of these. The drain was social upon completion. She tolerated this well was extubated and transferred to recovery room in stable condition.

## 2012-02-10 NOTE — Anesthesia Postprocedure Evaluation (Signed)
  Anesthesia Post-op Note  Patient: Alisha Chen  Procedure(s) Performed: Procedure(s) (LRB): BREAST LUMPECTOMY WITH NEDLE LOCALIZATION AND AXILLARY LYMPH NODE DISSECTION (Left) INSERTION PORT-A-CATH (Right)  Patient Location: PACU  Anesthesia Type: General  Level of Consciousness: awake  Airway and Oxygen Therapy: Patient Spontanous Breathing and Patient connected to face mask oxygen  Post-op Pain: none  Post-op Assessment: Post-op Vital signs reviewed, Patient's Cardiovascular Status Stable, Respiratory Function Stable and Patent Airway  Post-op Vital Signs: Reviewed and stable  Complications: No apparent anesthesia complications

## 2012-02-10 NOTE — Anesthesia Procedure Notes (Addendum)
Date/Time: 02/10/2012 9:53 AM Performed by: Signa Kell C   Procedure Name: LMA Insertion Date/Time: 02/10/2012 9:53 AM Performed by: Burna Cash Pre-anesthesia Checklist: Patient identified, Emergency Drugs available, Suction available and Patient being monitored Patient Re-evaluated:Patient Re-evaluated prior to inductionOxygen Delivery Method: Circle System Utilized Preoxygenation: Pre-oxygenation with 100% oxygen Intubation Type: IV induction Ventilation: Mask ventilation without difficulty LMA: LMA inserted LMA Size: 4.0 Number of attempts: 1 Airway Equipment and Method: bite block Placement Confirmation: positive ETCO2 Tube secured with: Tape Dental Injury: Teeth and Oropharynx as per pre-operative assessment

## 2012-02-10 NOTE — H&P (View-Only) (Signed)
Patient ID: Alisha Chen, female   DOB: [birth year], 46 y.o.   MRN: [id number]  Chief Complaint  Patient presents with  . Other    HPI Alisha Chen is a 46 y.o. female.  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  Magnification views show a cluster in the outer portion of the left breast posteriorly along with a second cluster near the axilla.  MRI shows 5 mm enhancing nodule along posterior margin of the stereotactic biopsy hematoma, suspicious 11 mm mass in the deep upper outer left breast near axillary tail that is 5.4 cm posterior to the stereotactic biopsy site.  There is also suspicious left axillary lymphadenopathy. 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  The first shows er positive at 96% and progesterone positive at 99%, Ki67 is 74%, and Her 2 is pending.  The other breast shows IDC with er 97%, pr 100% and Ki67  Is 48% and her 2 neu is not amplified.  A biopsy of the lymph node is also positive for metastatic ductal carcinoma.  She does not have complaints referable to her breasts.  She comes in today to discuss options for treatment of her breast cancer.  She does not have a family history of breast or ovarian cancer.  Past Medical History  Diagnosis Date  . Back pain   . Night sweats   . Hx: UTI (urinary tract infection)     Past Surgical History  Procedure Date  . Spine surgery 01/2011  . Foot surgery     multiple    Family History  Problem Relation Age of Onset  . Diabetes Mother     Social History History  Substance Use Topics  . Smoking status: Never Smoker   . Smokeless tobacco: Not on file  . Alcohol Use: No    Allergies  Allergen Reactions  . Shellfish-Derived Products Hives    Makes "me feel like I'm burning all over."  . Clarithromycin     REACTION:  NAUSEA,WEAK,BITTER TASTE  . Penicillins     Per pt's mom; pt doesn't remember.    Current Outpatient Prescriptions  Medication Sig Dispense Refill  . ibuprofen (ADVIL,MOTRIN) 200 MG tablet Take 400 mg by mouth as needed.        . pseudoephedrine (SUDAFED) 120 MG 12 hr tablet Take 120 mg by mouth every 12 (twelve) hours.        . pseudoephedrine-guaifenesin (TUSSIN PE) 30-100 MG/5ML SYRP Take 5 mLs by mouth every 4 (four) hours as needed.          Review of Systems Review of Systems  Constitutional: Negative for fever, chills and unexpected weight change.  HENT: Negative for hearing loss, congestion, sore throat, trouble swallowing and voice change.   Eyes: Negative for visual disturbance.  Respiratory: Negative for cough and wheezing.   Cardiovascular: Negative for chest pain, palpitations and leg swelling.  Gastrointestinal: Negative for nausea, vomiting, abdominal pain, diarrhea, constipation, blood in stool, abdominal distention and anal bleeding.  Genitourinary: Negative for hematuria, vaginal bleeding and difficulty urinating.  Musculoskeletal: Negative for arthralgias.  Skin: Negative for rash and wound.  Neurological: Negative for seizures, syncope and headaches.  Hematological: Negative for adenopathy. Does not bruise/bleed easily.  Psychiatric/Behavioral: Negative for confusion.    There were no vitals taken for this visit.  Physical  Exam Wt Readings from Last 3 Encounters:  01/18/12 173 lb 6.4 oz (78.654 kg)  09/20/11 171 lb 6.4 oz (77.747 kg)  09/24/10 170 lb 3.2 oz (77.202 kg)   Temp Readings from Last 3 Encounters:  01/18/12 98.6 F (37 C)   10/11/11 97.6 F (36.4 C) Oral  09/20/11 98.7 F (37.1 C) Oral   BP Readings from Last 3 Encounters:  01/18/12 122/73  09/20/11 112/80  09/24/10 114/76   Pulse Readings from Last 3 Encounters:  01/18/12 89  09/20/11 94  09/24/10 88    Physical Exam  Vitals reviewed. Constitutional: She appears well-developed  and well-nourished.  Neck: Neck supple.  Cardiovascular: Normal rate, regular rhythm and normal heart sounds.   Pulmonary/Chest: Effort normal. She has no wheezes. She has no rales. Right breast exhibits no inverted nipple, no mass, no nipple discharge, no skin change and no tenderness. Left breast exhibits mass (significant left outer breast hematoma). Left breast exhibits no inverted nipple, no nipple discharge, no skin change and no tenderness. Breasts are symmetrical.    Abdominal: Soft.  Lymphadenopathy:    She has no cervical adenopathy.    She has axillary adenopathy.       Left axillary: Lateral (mobile mildly enlarged node palpable ) adenopathy present.       Right: No supraclavicular adenopathy present.       Left: No supraclavicular adenopathy present.    Data Reviewed BILATERAL BREAST MRI WITH AND WITHOUT CONTRAST  Technique: Multiplanar, multisequence MR images of both breasts  were obtained prior to and following the intravenous administration  of 15ml of Multihance. Three dimensional images were evaluated at  the independent DynaCad workstation.  Comparison: Bilateral screening mammogram 10/12/2011, left  diagnostic mammogram 12/12/2011, and post stereotactic biopsy left  mammogram 01/09/2012.  Findings: There is a moderate, symmetric background enhancement  pattern bilaterally.  A small, 1.3 cm post-biopsy hematoma in the outer left breast, near  the junction of the middle and posterior thirds corresponds to the  recent stereotactic biopsy site. Along the posterior margin of the  biopsy cavity is a 5 mm nodular area of enhancement with washout  kinetics.  Posterior and slightly cephalad to the stereotactic biopsy site, in  the far posterior upper outer left breast, is a rim-enhancing  slightly irregular mass with washout kinetics. This mass measures  11 x 8 x 10 mm and is 5.4 cm posterior and slightly cephalad to the  biopsy site, when measured on the sagittal  image. This is  suspected to reflect the second site of calcifications with  associated nodular density seen on the screening mammogram of  November 2012 (on mammography, these two areas also measure  approximately 5.4 cm apart.)  No additional areas of concern are seen in the left breast. There  is no skin thickening.  No suspicious enhancement or mass is identified in the right breast  to suggest malignancy.  There is a suspicious level I left axillary lymph node, in the  inferior left axilla. The node is 1.6 x 1.0 cm in size,  heterogeneously enhancing, and has a thickened cortex. This lymph  node measures 1.6 x 1.0 cm axial dimensions. No right axillary or  internal mammary chain lymphadenopathy is identified.  IMPRESSION:  1. 5 mm enhancing nodule along the posterior margin of the  stereotactic biopsy hematoma in the outer left breast likely  reflects biopsy-proven malignancy.  2. Suspicious 11 mm mass in the deep upper outer left breast, near  the  axillary tail (5.4 cm posterior to the stereotactic biopsy  site). A second look ultrasound and possible biopsy is suggested  to evaluate extent of disease.  3. Suspicious left axillary lymphadenopathy. This can be further  evaluated at second look ultrasound, with possible biopsy.  4. No MRI evidence malignancy in the right breast.   Assessment    Locally advanced left breast cancer    Plan    Left breast wire guided bracketed lumpectomy, left alnd, port placement   We discussed today that would be reasonable to perform wire guided bracketed lumpectomy.  I think even though the distance is somewhat higher she has a large breast and this is her upper outer quadrant.  I think I can remove this area with a lumpectomy and two wires.  We talked extensively about positive margins and possibility of returning to operating room as 10-20%.  We also discussed alnd as she has positive nodes up front. We discussed port placement and risks of  bleeding, infection, pneumothorax.  We discussed the staging and pathophysiology of breast cancer. We discussed all of the different options for treatment for breast cancer including surgery, chemotherapy, radiation therapy, Herceptin, and antiestrogen therapy.  We discussed about a 15% risk lifetime of chronic shoulder pain as well as lymphedema associated with a sentinel lymph node biopsy.  We discussed the options for treatment of the breast cancer which included lumpectomy versus a mastectomy. We discussed the performance of the lumpectomy with a wire placement. We discussed a 10-20% chance of a positive margin requiring reexcision in the operating room. We also discussed that she may need radiation therapy or antiestrogen therapy or both if she undergoes lumpectomy. We discussed the mastectomy and the postoperative care for that as well. We discussed that there is no difference in her survival whether she undergoes lumpectomy with radiation therapy or antiestrogen therapy versus a mastectomy. There is a slight difference in the local recurrence rate being 3-5% with lumpectomy and about 1% with a mastectomy. We discussed the risks of operation including bleeding, infection, possible reoperation. She understands her further therapy will be based on what her stages at the time of her operation.  She is due to get genetic testing on Monday and will plan on scheduling tentatively for week of March 18.      Idabelle Mcpeters 01/18/2012, 4:51 PM

## 2012-02-10 NOTE — Interval H&P Note (Signed)
History and Physical Interval Note:  02/10/2012 9:33 AM  Alisha Chen  has presented today for surgery, with the diagnosis of LEFT BREAST CANCER   The various methods of treatment have been discussed with the patient and family. After consideration of risks, benefits and other options for treatment, the patient has consented to  Procedure(s) (LRB): BREAST LUMPECTOMY WITH NEDLE LOCALIZATION AND AXILLARY LYMPH NODE DISSECTION (Left) INSERTION PORT-A-CATH (N/A) as a surgical intervention .  The patients' history has been reviewed, patient examined, no change in status, stable for surgery.  I have reviewed the patients' chart and labs.  Questions were answered to the patient's satisfaction.     Perian Tedder

## 2012-02-13 ENCOUNTER — Encounter (HOSPITAL_BASED_OUTPATIENT_CLINIC_OR_DEPARTMENT_OTHER): Payer: Self-pay | Admitting: General Surgery

## 2012-02-14 ENCOUNTER — Telehealth (INDEPENDENT_AMBULATORY_CARE_PROVIDER_SITE_OTHER): Payer: Self-pay

## 2012-02-14 NOTE — Telephone Encounter (Signed)
It just got finalized.  i will call her and purcell today.

## 2012-02-14 NOTE — Telephone Encounter (Signed)
Please call Ms. Shadix @ [phone] (cell) with results and appointment information.

## 2012-02-14 NOTE — Telephone Encounter (Signed)
Pt calling for her path report can you please advise what to tell pt.

## 2012-02-16 ENCOUNTER — Ambulatory Visit (INDEPENDENT_AMBULATORY_CARE_PROVIDER_SITE_OTHER): Payer: BC Managed Care – PPO | Admitting: General Surgery

## 2012-02-16 ENCOUNTER — Encounter (INDEPENDENT_AMBULATORY_CARE_PROVIDER_SITE_OTHER): Payer: Self-pay | Admitting: General Surgery

## 2012-02-16 ENCOUNTER — Encounter (HOSPITAL_BASED_OUTPATIENT_CLINIC_OR_DEPARTMENT_OTHER): Payer: Self-pay

## 2012-02-16 ENCOUNTER — Telehealth (INDEPENDENT_AMBULATORY_CARE_PROVIDER_SITE_OTHER): Payer: Self-pay | Admitting: General Surgery

## 2012-02-16 VITALS — BP 100/68 | HR 72 | Temp 97.7°F | Resp 16 | Ht 67.0 in | Wt 170.8 lb

## 2012-02-16 DIAGNOSIS — Z09 Encounter for follow-up examination after completed treatment for conditions other than malignant neoplasm: Secondary | ICD-10-CM

## 2012-02-16 NOTE — Progress Notes (Signed)
Subjective:     Patient ID: Alisha Chen, female   DOB: [birth year], 46 y.o.   MRN: [id number]  HPI 39 yof with left breast cancer who had two sites in luoq that were idc.  She also had enlarged node biopsied that was invasive mammary carcinoma.  She was seen in [facility].  I took her to OR for lumpectomy which was done with two wires to excise both tumors, alnd and port placement.  She has done well.  Her drain has put out less than 30cc for past 3 days.  Her pathology shows two IDC measuring 1.3 and 8mm.  Margins are negative.  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  I took out 19 nodes (one of which I thought was the 1.5 cm node on scans and appeared to have been biopsied given some inflammation around it and these were all negative.  Review of Systems     Objective:   Physical Exam  Vitals reviewed. Pulmonary/Chest:         Assessment:     Left breast cancer s/p lump/alnd/port    Plan:     She is doing well from surgery and I removed drain today. I am concerned about her pathology.  The initial biopsy that I have reviewed with pathology was nodal tissue.  I felt an enlarged node that I thought was one involved and did a level 1/2 alnd.  All these nodes are negative though and I did not feel anything else palpable.  I would like to make sure that there is not anything else left and I think at this point a ct chest would be best.  I will get that later next week to let some postop changes get better.  She and I discussed this today. I discussed her margins with Dr. Basilio Cairo.  The 1 mm margin is acceptable per NCCN guidelines although this is close.  I did an extensive lumpectomy already and would agree with moving on to radiation therapy.I will plan on following up after ct.

## 2012-02-17 ENCOUNTER — Encounter: Payer: Self-pay | Admitting: Oncology

## 2012-02-17 ENCOUNTER — Other Ambulatory Visit (HOSPITAL_BASED_OUTPATIENT_CLINIC_OR_DEPARTMENT_OTHER): Payer: BC Managed Care – PPO | Admitting: Lab

## 2012-02-17 ENCOUNTER — Ambulatory Visit (HOSPITAL_BASED_OUTPATIENT_CLINIC_OR_DEPARTMENT_OTHER): Payer: BC Managed Care – PPO | Admitting: Oncology

## 2012-02-17 VITALS — BP 109/74 | HR 74 | Temp 97.9°F | Ht 67.0 in | Wt 172.5 lb

## 2012-02-17 DIAGNOSIS — C50419 Malignant neoplasm of upper-outer quadrant of unspecified female breast: Secondary | ICD-10-CM

## 2012-02-17 DIAGNOSIS — Z170 Estrogen receptor positive status [ER+]: Secondary | ICD-10-CM

## 2012-02-17 LAB — CBC WITH DIFFERENTIAL/PLATELET
BASO%: 0.9 % (ref 0.0–2.0)
EOS%: 4 % (ref 0.0–7.0)
LYMPH%: 24.4 % (ref 14.0–49.7)
MCH: 28 pg (ref 25.1–34.0)
MCHC: 32.1 g/dL (ref 31.5–36.0)
MONO#: 0.3 10*3/uL (ref 0.1–0.9)
MONO%: 5.8 % (ref 0.0–14.0)
Platelets: 209 10*3/uL (ref 145–400)
RBC: 4.02 10*6/uL (ref 3.70–5.45)
WBC: 5.5 10*3/uL (ref 3.9–10.3)
nRBC: 0 % (ref 0–0)

## 2012-02-17 LAB — COMPREHENSIVE METABOLIC PANEL
ALT: 17 U/L (ref 0–35)
AST: 19 U/L (ref 0–37)
Alkaline Phosphatase: 71 U/L (ref 39–117)
BUN: 12 mg/dL (ref 6–23)
Chloride: 104 mEq/L (ref 96–112)
Creatinine, Ser: 0.87 mg/dL (ref 0.50–1.10)
Total Bilirubin: 0.3 mg/dL (ref 0.3–1.2)

## 2012-02-20 ENCOUNTER — Telehealth: Payer: Self-pay | Admitting: Oncology

## 2012-02-20 NOTE — Telephone Encounter (Signed)
Appt made 02/29/12 @ 1:40pm.

## 2012-02-20 NOTE — Telephone Encounter (Signed)
S/w the pt and she is aware of her appt on 03/12/2012@2 :00pm

## 2012-02-22 ENCOUNTER — Telehealth (INDEPENDENT_AMBULATORY_CARE_PROVIDER_SITE_OTHER): Payer: Self-pay | Admitting: General Surgery

## 2012-02-22 DIAGNOSIS — C50912 Malignant neoplasm of unspecified site of left female breast: Secondary | ICD-10-CM

## 2012-02-22 NOTE — Telephone Encounter (Signed)
[facility] calling to request [place] to change the order on pt from CT Chest w/w/o contrast to just CT Chest w/contrast only. I can put the new order in for CT Chest w/contrast only but it will not let me cancel the old order. I will call [facility] to notify them.

## 2012-02-22 NOTE — Telephone Encounter (Signed)
Called pt back to let her know ok to drive as long as she is not taking any pain medicine. The pt also wanted to know about working out at the gym. The pt wants to ride a bike or walk on the treadmill. I advised the pt that those exercises would be ok but no lifting weights with upperbody until her next appt with Dr Dwain Sarna. The pt understands.

## 2012-02-24 ENCOUNTER — Inpatient Hospital Stay: Admission: RE | Admit: 2012-02-24 | Payer: BC Managed Care – PPO | Source: Ambulatory Visit

## 2012-02-24 ENCOUNTER — Ambulatory Visit
Admission: RE | Admit: 2012-02-24 | Discharge: 2012-02-24 | Disposition: A | Discharge status: Home / Self Care | Payer: BC Managed Care – PPO | Source: Ambulatory Visit | Attending: General Surgery | Admitting: General Surgery

## 2012-02-24 DIAGNOSIS — C50912 Malignant neoplasm of unspecified site of left female breast: Secondary | ICD-10-CM

## 2012-02-24 MED ORDER — IOHEXOL 300 MG/ML  SOLN
75.0000 mL | Freq: Once | INTRAMUSCULAR | Status: AC | PRN
  Administered 2012-02-24: 75 mL via INTRAVENOUS

## 2012-02-29 ENCOUNTER — Ambulatory Visit (INDEPENDENT_AMBULATORY_CARE_PROVIDER_SITE_OTHER): Payer: BC Managed Care – PPO | Admitting: General Surgery

## 2012-02-29 ENCOUNTER — Encounter (INDEPENDENT_AMBULATORY_CARE_PROVIDER_SITE_OTHER): Payer: Self-pay | Admitting: General Surgery

## 2012-02-29 VITALS — BP 104/62 | HR 70 | Resp 18 | Ht 67.0 in | Wt 170.0 lb

## 2012-02-29 DIAGNOSIS — C50419 Malignant neoplasm of upper-outer quadrant of unspecified female breast: Secondary | ICD-10-CM

## 2012-02-29 NOTE — Progress Notes (Signed)
Subjective:     Patient ID: Alisha Chen, female   DOB: 08/12/1966, 46 y.o.   MRN: [id number]  HPI This is a 46-year-old female who had node positive breast cancer  She underwent lumpectomy with a negative margin. 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 All 19 of her nodes were negative. I was concerned that she had positive nodes are present I may have not taken out this note. I obtained a chest CT which does show a residual note. She comes back in today to discuss these issues. She otherwise is doing well.  Review of Systems CT CHEST WITH CONTRAST  Technique: Multidetector CT imaging of the chest was performed  following the standard protocol during bolus administration of  intravenous contrast.  Contrast: 75 ml of omni 300  Comparison: 38/11/13  Findings:  Surgical clips are noted within the axillary tail of the left  breast  There are postsurgical changes and fat stranding within the left  axilla. Enlarged left axillary lymph node is again noted. This  has a short axis diameter of 1.4 cm, image 30. Previously 1.6 cm.  No new or progressive axillary adenopathy. There are no enlarged  right axillary lymph nodes.  No supraclavicular adenopathy.  There is no mediastinal or hilar adenopathy. No pericardial or  pleural effusion.  The lungs are clear. No suspicious pulmonary parenchymal nodule or  mass.  The tracheobronchial tree appears normal.  Review of the visualized bony structures is unremarkable.  IMPRESSION:  1. Residual left axillary lymph node measures 1.4 cm.  2. Postop changes within the axillary tail of the left breast and  left axilla.     Objective:   Physical Exam Well healing incision in breast and axilla, no masses identified    Assessment:     Left breast cancer s/p lump/alnd/port placement    Plan:     I'm concerned with the residual node on her CAT scan as well as the pathology report that she still may have one node that is positive for breast cancer.  I think it is important for decisions for chemotherapy to ensure that she had node positive disease as well.  I think that the best course of action after reviewing with radiology will proceed with ultrasound of this note is identifiable doing a wire-guided biopsy. I discussed the indications for this. I also discussed her with medical oncology this morning. If this is identified by ultrasound we will plan on doing a left axillary node biopsy with wire guidance.      

## 2012-03-01 ENCOUNTER — Ambulatory Visit
Admission: RE | Admit: 2012-03-01 | Discharge: 2012-03-01 | Disposition: A | Discharge status: Home / Self Care | Payer: BC Managed Care – PPO | Source: Ambulatory Visit | Attending: General Surgery | Admitting: General Surgery

## 2012-03-01 DIAGNOSIS — C50419 Malignant neoplasm of upper-outer quadrant of unspecified female breast: Secondary | ICD-10-CM

## 2012-03-02 ENCOUNTER — Other Ambulatory Visit (INDEPENDENT_AMBULATORY_CARE_PROVIDER_SITE_OTHER): Payer: Self-pay | Admitting: General Surgery

## 2012-03-02 ENCOUNTER — Telehealth (INDEPENDENT_AMBULATORY_CARE_PROVIDER_SITE_OTHER): Payer: Self-pay

## 2012-03-02 DIAGNOSIS — C50919 Malignant neoplasm of unspecified site of unspecified female breast: Secondary | ICD-10-CM

## 2012-03-02 NOTE — Telephone Encounter (Signed)
Called pt to let her know that Dr Dwain Sarna wants to continue with the plan for surgery on Tuesday to have lymp node removed. Per DR Dwain Sarna he wants pt to have wire loc.with the surgery. Amy our scheduler will speak with the pt about the instructions.

## 2012-03-02 NOTE — Progress Notes (Signed)
No labs needed Pt states dr Dwain Sarna told her she would stay overnight-will reserve bed in case.

## 2012-03-05 ENCOUNTER — Other Ambulatory Visit (INDEPENDENT_AMBULATORY_CARE_PROVIDER_SITE_OTHER): Payer: Self-pay | Admitting: General Surgery

## 2012-03-05 DIAGNOSIS — C50919 Malignant neoplasm of unspecified site of unspecified female breast: Secondary | ICD-10-CM

## 2012-03-05 NOTE — Progress Notes (Signed)
OFFICE PROGRESS NOTE  CC  Alisha Freud, DO, DO [phone] [facility] [address] [place] [address] Dr. Emelia Loron Dr. 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 Patient is now status post left breast lumpectomy with left axillary lymph node dissection performed on 02/10/2012  PRIOR THERAPY:  #1 patient underwent a left breast lumpectomy that revealed 2 foci of invasive ductal carcinoma measuring 1.3 cm and 0.8 cm grade 2 with adjacent high-grade ductal carcinoma in situ with no evidence of angiolymphatic invasion. The tumor was estrogen receptor +97% progesterone receptor +100% HER-2/neu negative proliferation marker 48% second focus was ER +96% PR +99% HER-2/neu negative with a proliferation marker 74%. 19 lymph nodes were examined all of which were negative for metastatic disease.  #2 however patient on her original presentation presented with a positive lymph node which was not found on her final pathology specimen. Patient has been seen by Dr. Emelia Loron who has ordered more imaging studies on her.  CURRENT THERAPY:Patient likely will undergo reexcision  INTERVAL HISTORY: Alisha Chen 46 y.o. female returns for Followup visit today after her lumpectomy and axillary lymph node dissection. She is in considerable pain. She and I discussed her pathology today. She also understands that she is gong to have another imaging study performed to look at the axilla as well as the breast. Since we did not find on axillary lymph node dissection any positive lymph nodes however her initial biopsy was positive for a lymph node contained invasive ductal carcinoma. She is quite anxious about the whole situation but certainly she wants to proceed with the recommendations that have been made  by Dr. Dwain Sarna.  MEDICAL HISTORY: Past Medical History  Diagnosis Date  . Back pain   . Hx: UTI (urinary tract infection)   . Cancer     LT BREAST    ALLERGIES:  is allergic to shellfish-derived products; clarithromycin; and penicillins.  MEDICATIONS:  Current Outpatient Prescriptions  Medication Sig Dispense Refill  . ferrous sulfate 325 (65 FE) MG EC tablet Take 325 mg by mouth 3 (three) times daily with meals.        SURGICAL HISTORY:  Past Surgical History  Procedure Date  . Spine surgery 01/2011  . Foot surgery     multiple  . Back surgery     LUMBAR DECOMPRESSION  . Portacath placement 02/10/2012    Procedure: INSERTION PORT-A-CATH;  Surgeon: Emelia Loron, MD;  Location: [facility];  Service: General;  Laterality: Right;  . Breast surgery 2013    left breast lumpectomy, left alnd    REVIEW OF SYSTEMS:  Pertinent items are noted in HPI.   PHYSICAL EXAMINATION: General appearance: alert, cooperative and appears stated age Lymph nodes: Cervical, supraclavicular, and axillary nodes normal. Resp: clear to auscultation bilaterally Back: symmetric, no curvature. ROM normal. No CVA tenderness. Cardio: regular rate and rhythm, S1, S2 normal, no murmur, click, rub or gallop GI: soft, non-tender; bowel sounds normal; no masses,  no organomegaly Extremities: extremities normal, atraumatic, no cyanosis or edema Neurologic: Grossly normal  ECOG PERFORMANCE STATUS: 1 - Symptomatic but completely ambulatory  Blood pressure 109/74, pulse 74, temperature 97.9 F (36.6 C), height 5\' 7"  (1.702 m), weight 172 lb 8 oz (78.245 kg), last menstrual period 01/23/2012.  LABORATORY DATA: Lab Results  Component Value Date   WBC 5.5 02/17/2012   HGB 11.3* 02/17/2012   HCT 35.1 02/17/2012   MCV 87.4 02/17/2012   PLT 209 02/17/2012      Chemistry      Component Value Date/Time   NA 137 02/17/2012 1355   K 4.6 02/17/2012 1355   CL 104 02/17/2012 1355   CO2 25  02/17/2012 1355   BUN 12 02/17/2012 1355   CREATININE 0.87 02/17/2012 1355      Component Value Date/Time   CALCIUM 9.9 02/17/2012 1355   ALKPHOS 71 02/17/2012 1355   AST 19 02/17/2012 1355   ALT 17 02/17/2012 1355   BILITOT 0.3 02/17/2012 1355       RADIOGRAPHIC STUDIES:  Ct Chest W Contrast  02/24/2012  *RADIOLOGY REPORT*  Clinical Data: Breast cancer.  Evaluate for adenopathy.  CT CHEST WITH CONTRAST  Technique:  Multidetector CT imaging of the chest was performed following the standard protocol during bolus administration of intravenous contrast.  Contrast:  75 ml of omni 300  Comparison: 38/11/13  Findings:  Surgical clips are noted within the axillary tail of the left breast  There are postsurgical changes and fat stranding within the left axilla.  Enlarged left axillary lymph node is again noted.  This has a short axis diameter of 1.4 cm, image 30.  Previously 1.6 cm.  No new or progressive axillary adenopathy.  There are no enlarged right axillary lymph nodes.  No supraclavicular adenopathy.  There is no mediastinal or hilar adenopathy.  No pericardial or pleural effusion.  The lungs are clear.  No suspicious pulmonary parenchymal nodule or mass.  The tracheobronchial tree appears normal.  Review of the visualized bony structures is unremarkable.  IMPRESSION:  1.  Residual left axillary lymph node measures 1.4 cm. 2.  Postop changes within the axillary tail of the left breast and left axilla.  Original Report Authenticated By: Rosealee Albee, M.D.   US Breast Left  03/01/2012  *RADIOLOGY REPORT*  Clinical Data:  left breast carcinoma, with recent CT evidence of enlarged left axillary lymph node  LEFT BREAST ULTRASOUND  Comparison:  recent CT scan of the thorax  On physical exam, there are no palpable abnormalities.  Findings: Ultrasound is performed, showing two adjacent lymph nodes in the inferior left axilla.  One demonstrates equivocal cortical thickening with a definitively preserved fatty  hilum, and measures 25 x 10 x 27mm.  Immediately inferior and medial to this is a second lymph node, measuring 19 x 14 x 10mm, which demonstrates an abnormal appearance in that no hilum is evident.  IMPRESSION: Two adjacent low axillary lymph nodes, one of which demonstrates an abnormal appearance, while the other demonstrates only mild cortical thickening.  BI-RADS CATEGORY 6:  Known biopsy-proven malignancy - appropriate action should be taken.  RECOMMENDATION:  The patient is scheduled for resection of these lymph nodes next week, having already undergone axillary node dissection on the left recently.  Ultrasound-guided wire localalization prior to resection could be considered to aid in preoperative identification.  Original Report Authenticated By: Phineas Inches   Dg Chest Port 1 View  02/10/2012  *RADIOLOGY REPORT*  Clinical Data: Port placement  PORTABLE CHEST - 1 VIEW  Comparison: 01/30/2012  Findings: Right subclavian vein Port-A-Cath placed with its tip at the cavoatrial junction.  No pneumothorax.  Mild cardiomegaly. Clear lungs.  IMPRESSION: Right subclavian vein Port-A-Cath placement with its tip at the cavoatrial junction and no pneumothorax.  Original Report Authenticated By: Marlowe Aschoff  HOSS, M.D.   Mm Breast Surgical Specimen  02/10/2012  *RADIOLOGY REPORT*  Clinical Data:  Two foci of carcinoma left upper outer quadrant  NEEDLE LOCALIZATION WITH MAMMOGRAPHIC GUIDANCE AND SPECIMEN RADIOGRAPH  Patient presents for needle localization prior to lumpectomy.  I met with the patient and we discussed the procedure of needle localization including benefits and alternatives. We discussed the high likelihood of a successful procedure. We discussed the risks of the procedure, including infection, bleeding, tissue injury, and further surgery. Informed, written consent was given.  Using mammographic guidance, sterile technique, 2% lidocaine andtwo 5 cm modified Kopans needles, both marker clips were localized using a  lateral medial approach.  The films are marked for Dr. Dwain Sarna.  Specimen radiograph was performed at [facility], and confirms both wires and marker clips present in the tissue sample.  The specimen is marked for pathology.  IMPRESSION: Needle localization left breast.  No apparent complications.  Original Report Authenticated By: Phineas Inches   Mm Breast Wire Localization Left  02/10/2012  *RADIOLOGY REPORT*  Clinical Data:  Two foci of carcinoma left upper outer quadrant  NEEDLE LOCALIZATION WITH MAMMOGRAPHIC GUIDANCE AND SPECIMEN RADIOGRAPH  Patient presents for needle localization prior to lumpectomy.  I met with the patient and we discussed the procedure of needle localization including benefits and alternatives. We discussed the high likelihood of a successful procedure. We discussed the risks of the procedure, including infection, bleeding, tissue injury, and further surgery. Informed, written consent was given.  Using mammographic guidance, sterile technique, 2% lidocaine andtwo 5 cm modified Kopans needles, both marker clips were localized using a lateral medial approach.  The films are marked for Dr. Dwain Sarna.  Specimen radiograph was performed at [facility], and confirms both wires and marker clips present in the tissue sample.  The specimen is marked for pathology.  IMPRESSION: Needle localization left breast.  No apparent complications.  Original Report Authenticated By: Estil Daft Guide Cv Line-no Report  02/10/2012  CLINICAL DATA: Right Port-A-Cath placement   FLOURO GUIDE CV LINE  Fluoroscopy was utilized by the requesting physician.  No radiographic  interpretation.      ASSESSMENT: 46 year old female with stage II a (T1 CN1) invasive ductal carcinoma status post left lumpectomy with excellent lymph node dissection. The primary tumor revealed 2 foci measuring 1.3 and 0.8 cm. However the lymph the axillary contents 15 with 19 lymph nodes were negative for metastatic disease however she  does have one positive lymph node on her initial biopsy.   PLAN: I have discussed the case with Dr. Dwain Sarna during 1 of her multidisciplinary breast clinic. He is planning on taking the patient back for more imaging study and possibly more surgery. He does have a Port-A-Cath placed and certainly she will need chemotherapy do to high risk nature of her disease and I have discussed this with the patient. However the final decision of chemotherapy will be made once her surgery is completed   All questions were answered. The patient knows to call the clinic with any problems, questions or concerns. We can certainly see the patient much sooner if necessary.  I spent 20 minutes counseling the patient face to face. The total time spent in the appointment was 30 minutes.    Drue Second, MD Medical/Oncology [facility] [phone] (beeper) [phone] (Office)  03/05/2012, 5:42 PM

## 2012-03-06 ENCOUNTER — Encounter (HOSPITAL_BASED_OUTPATIENT_CLINIC_OR_DEPARTMENT_OTHER)
Admission: RE | Disposition: A | Discharge status: Home / Self Care | Payer: Self-pay | Source: Ambulatory Visit | Attending: General Surgery

## 2012-03-06 ENCOUNTER — Ambulatory Visit (HOSPITAL_BASED_OUTPATIENT_CLINIC_OR_DEPARTMENT_OTHER): Payer: BC Managed Care – PPO | Admitting: Anesthesiology

## 2012-03-06 ENCOUNTER — Encounter (HOSPITAL_BASED_OUTPATIENT_CLINIC_OR_DEPARTMENT_OTHER): Payer: Self-pay | Admitting: Anesthesiology

## 2012-03-06 ENCOUNTER — Ambulatory Visit
Admission: RE | Admit: 2012-03-06 | Discharge: 2012-03-06 | Disposition: A | Discharge status: Home / Self Care | Payer: BC Managed Care – PPO | Source: Ambulatory Visit | Attending: General Surgery | Admitting: General Surgery

## 2012-03-06 ENCOUNTER — Ambulatory Visit (HOSPITAL_BASED_OUTPATIENT_CLINIC_OR_DEPARTMENT_OTHER)
Admission: RE | Admit: 2012-03-06 | Discharge: 2012-03-07 | Disposition: A | Discharge status: Home / Self Care | Payer: BC Managed Care – PPO | Source: Ambulatory Visit | Attending: General Surgery | Admitting: General Surgery

## 2012-03-06 ENCOUNTER — Encounter (HOSPITAL_BASED_OUTPATIENT_CLINIC_OR_DEPARTMENT_OTHER): Payer: Self-pay

## 2012-03-06 DIAGNOSIS — C50419 Malignant neoplasm of upper-outer quadrant of unspecified female breast: Secondary | ICD-10-CM | POA: Insufficient documentation

## 2012-03-06 DIAGNOSIS — C773 Secondary and unspecified malignant neoplasm of axilla and upper limb lymph nodes: Secondary | ICD-10-CM

## 2012-03-06 DIAGNOSIS — C50919 Malignant neoplasm of unspecified site of unspecified female breast: Secondary | ICD-10-CM

## 2012-03-06 HISTORY — PX: AXILLARY SURGERY: SHX892

## 2012-03-06 HISTORY — PX: OTHER SURGICAL HISTORY: SHX169

## 2012-03-06 SURGERY — AXILLARY LYMPH NODE BIOPSY
Anesthesia: General | Site: Axilla | Laterality: Left | Wound class: Clean

## 2012-03-06 MED ORDER — CIPROFLOXACIN IN D5W 400 MG/200ML IV SOLN
400.0000 mg | INTRAVENOUS | Status: AC
  Administered 2012-03-06: 400 mg via INTRAVENOUS

## 2012-03-06 MED ORDER — ACETAMINOPHEN 325 MG PO TABS: 650.0000 mg | ORAL_TABLET | Freq: Four times a day (QID) | ORAL | Status: DC | PRN

## 2012-03-06 MED ORDER — HYDROMORPHONE HCL PF 1 MG/ML IJ SOLN: 0.2500 mg | INTRAMUSCULAR | Status: DC | PRN

## 2012-03-06 MED ORDER — MIDAZOLAM HCL 2 MG/2ML IJ SOLN: 1.0000 mg | INTRAMUSCULAR | Status: DC | PRN

## 2012-03-06 MED ORDER — SODIUM CHLORIDE 0.9 % IV SOLN
INTRAVENOUS | Status: DC
  Administered 2012-03-06: 15:00:00 via INTRAVENOUS

## 2012-03-06 MED ORDER — FENTANYL CITRATE 0.05 MG/ML IJ SOLN: 50.0000 ug | INTRAMUSCULAR | Status: DC | PRN

## 2012-03-06 MED ORDER — PROPOFOL 10 MG/ML IV EMUL
INTRAVENOUS | Status: DC | PRN
  Administered 2012-03-06: 200 mg via INTRAVENOUS

## 2012-03-06 MED ORDER — LIDOCAINE HCL (CARDIAC) 20 MG/ML IV SOLN
INTRAVENOUS | Status: DC | PRN
  Administered 2012-03-06: 80 mg via INTRAVENOUS

## 2012-03-06 MED ORDER — OXYCODONE HCL 5 MG PO TABS
5.0000 mg | ORAL_TABLET | ORAL | Status: DC | PRN
  Administered 2012-03-06 (×3): 5 mg via ORAL

## 2012-03-06 MED ORDER — LORAZEPAM 2 MG/ML IJ SOLN: 1.0000 mg | Freq: Once | INTRAMUSCULAR | Status: AC | PRN

## 2012-03-06 MED ORDER — DEXAMETHASONE SODIUM PHOSPHATE 4 MG/ML IJ SOLN
INTRAMUSCULAR | Status: DC | PRN
  Administered 2012-03-06: 10 mg via INTRAVENOUS

## 2012-03-06 MED ORDER — MIDAZOLAM HCL 5 MG/5ML IJ SOLN
INTRAMUSCULAR | Status: DC | PRN
  Administered 2012-03-06: 2 mg via INTRAVENOUS

## 2012-03-06 MED ORDER — HYDROMORPHONE HCL PF 1 MG/ML IJ SOLN
0.2500 mg | INTRAMUSCULAR | Status: DC | PRN
  Administered 2012-03-06: 0.25 mg via INTRAVENOUS
  Administered 2012-03-06: 0.5 mg via INTRAVENOUS

## 2012-03-06 MED ORDER — LACTATED RINGERS IV SOLN
INTRAVENOUS | Status: DC
  Administered 2012-03-06: 12:00:00 via INTRAVENOUS

## 2012-03-06 MED ORDER — LORAZEPAM 2 MG/ML IJ SOLN: 1.0000 mg | Freq: Once | INTRAMUSCULAR | Status: DC | PRN

## 2012-03-06 MED ORDER — BUPIVACAINE-EPINEPHRINE PF 0.25-1:200000 % IJ SOLN
INTRAMUSCULAR | Status: DC | PRN
  Administered 2012-03-06: 10 mL

## 2012-03-06 MED ORDER — MORPHINE SULFATE 2 MG/ML IJ SOLN
2.0000 mg | INTRAMUSCULAR | Status: DC | PRN
  Administered 2012-03-06: 2 mg via INTRAVENOUS

## 2012-03-06 MED ORDER — FENTANYL CITRATE 0.05 MG/ML IJ SOLN
INTRAMUSCULAR | Status: DC | PRN
  Administered 2012-03-06: 100 ug via INTRAVENOUS

## 2012-03-06 MED ORDER — ACETAMINOPHEN 650 MG RE SUPP: 650.0000 mg | Freq: Four times a day (QID) | RECTAL | Status: DC | PRN

## 2012-03-06 MED ORDER — ONDANSETRON HCL 4 MG/2ML IJ SOLN: 4.0000 mg | Freq: Four times a day (QID) | INTRAMUSCULAR | Status: DC | PRN

## 2012-03-06 SURGICAL SUPPLY — 53 items
ADH SKN CLS APL DERMABOND .7 (GAUZE/BANDAGES/DRESSINGS)
APL SKNCLS STERI-STRIP NONHPOA (GAUZE/BANDAGES/DRESSINGS) ×1
APPLIER CLIP 9.375 MED OPEN (MISCELLANEOUS)
APR CLP MED 9.3 20 MLT OPN (MISCELLANEOUS)
BENZOIN TINCTURE PRP APPL 2/3 (GAUZE/BANDAGES/DRESSINGS) ×2 IMPLANT
BLADE SURG 15 STRL LF DISP TIS (BLADE) ×1 IMPLANT
BLADE SURG 15 STRL SS (BLADE) ×2
BNDG COHESIVE 4X5 TAN STRL (GAUZE/BANDAGES/DRESSINGS) IMPLANT
CANISTER SUCTION 1200CC (MISCELLANEOUS) ×2 IMPLANT
CHLORAPREP W/TINT 26ML (MISCELLANEOUS) ×2 IMPLANT
CLIP APPLIE 9.375 MED OPEN (MISCELLANEOUS) IMPLANT
CLOTH BEACON ORANGE TIMEOUT ST (SAFETY) ×2 IMPLANT
COVER MAYO STAND STRL (DRAPES) ×2 IMPLANT
COVER PROBE W GEL 5X96 (DRAPES) ×1 IMPLANT
COVER TABLE BACK 60X90 (DRAPES) ×2 IMPLANT
DECANTER SPIKE VIAL GLASS SM (MISCELLANEOUS) IMPLANT
DERMABOND ADVANCED (GAUZE/BANDAGES/DRESSINGS)
DERMABOND ADVANCED .7 DNX12 (GAUZE/BANDAGES/DRESSINGS) IMPLANT
DRAPE LAPAROSCOPIC ABDOMINAL (DRAPES) IMPLANT
DRAPE U-SHAPE 76X120 STRL (DRAPES) IMPLANT
DRSG TEGADERM 4X4.75 (GAUZE/BANDAGES/DRESSINGS) ×2 IMPLANT
ELECT COATED BLADE 2.86 ST (ELECTRODE) ×2 IMPLANT
ELECT REM PT RETURN 9FT ADLT (ELECTROSURGICAL) ×2
ELECTRODE REM PT RTRN 9FT ADLT (ELECTROSURGICAL) ×1 IMPLANT
GAUZE SPONGE 4X4 12PLY STRL LF (GAUZE/BANDAGES/DRESSINGS) ×1 IMPLANT
GLOVE BIO SURGEON STRL SZ7 (GLOVE) ×2 IMPLANT
GLOVE BIOGEL PI IND STRL 7.5 (GLOVE) ×1 IMPLANT
GLOVE BIOGEL PI INDICATOR 7.5 (GLOVE) ×1
GLOVE ECLIPSE 6.5 STRL STRAW (GLOVE) ×1 IMPLANT
GOWN PREVENTION PLUS XLARGE (GOWN DISPOSABLE) ×4 IMPLANT
NDL HYPO 25X1 1.5 SAFETY (NEEDLE) ×1 IMPLANT
NEEDLE HYPO 25X1 1.5 SAFETY (NEEDLE) ×2 IMPLANT
NS IRRIG 1000ML POUR BTL (IV SOLUTION) ×1 IMPLANT
PACK BASIN DAY SURGERY FS (CUSTOM PROCEDURE TRAY) ×2 IMPLANT
PENCIL BUTTON HOLSTER BLD 10FT (ELECTRODE) ×2 IMPLANT
SLEEVE SCD COMPRESS KNEE MED (MISCELLANEOUS) ×2 IMPLANT
SPONGE LAP 4X18 X RAY DECT (DISPOSABLE) ×2 IMPLANT
STAPLER VISISTAT 35W (STAPLE) ×2 IMPLANT
STOCKINETTE IMPERVIOUS LG (DRAPES) IMPLANT
STRIP CLOSURE SKIN 1/2X4 (GAUZE/BANDAGES/DRESSINGS) ×2 IMPLANT
SUT MNCRL AB 4-0 PS2 18 (SUTURE) ×4 IMPLANT
SUT SILK 2 0 SH (SUTURE) IMPLANT
SUT VIC AB 2-0 SH 27 (SUTURE) ×4
SUT VIC AB 2-0 SH 27XBRD (SUTURE) ×1 IMPLANT
SUT VIC AB 3-0 SH 27 (SUTURE) ×2
SUT VIC AB 3-0 SH 27X BRD (SUTURE) IMPLANT
SUT VICRYL AB 3 0 TIES (SUTURE) IMPLANT
SYR CONTROL 10ML LL (SYRINGE) ×3 IMPLANT
TOWEL OR 17X24 6PK STRL BLUE (TOWEL DISPOSABLE) ×2 IMPLANT
TOWEL OR NON WOVEN STRL DISP B (DISPOSABLE) ×2 IMPLANT
TUBE CONNECTING 20X1/4 (TUBING) ×1 IMPLANT
WATER STERILE IRR 1000ML POUR (IV SOLUTION) IMPLANT
YANKAUER SUCT BULB TIP NO VENT (SUCTIONS) ×1 IMPLANT

## 2012-03-06 NOTE — Discharge Instructions (Signed)
[facility] Office Phone Number [phone]  NODE BIOPSY: POST OP INSTRUCTIONS  Always review your discharge instruction sheet given to you by the facility where your surgery was performed.  IF YOU HAVE DISABILITY OR FAMILY LEAVE FORMS, YOU MUST BRING THEM TO THE OFFICE FOR PROCESSING.  DO NOT GIVE THEM TO YOUR DOCTOR.  1. A prescription for pain medication may be given to you upon discharge.  Take your pain medication as prescribed, if needed.  If narcotic pain medicine is not needed, then you may take acetaminophen (Tylenol), naprosyn (Alleve) or ibuprofen (Advil) as needed. 2. Take your usually prescribed medications unless otherwise directed 3. If you need a refill on your pain medication, please contact your pharmacy.  They will contact our office to request authorization.  Prescriptions will not be filled after 5pm or on week-ends. 4. You should eat very light the first 24 hours after surgery, such as soup, crackers, pudding, etc.  Resume your normal diet the day after surgery. 5. Most patients will experience some swelling and bruising in the area of incision.  Ice packs and a good support bra will help.  Wear the breast binder provided or a sports bra for 72 hours day and night.  After that wear a sports bra during the day until you return to the office. Swelling and bruising can take several days to resolve.  6. It is common to experience some constipation if taking pain medication after surgery.  Increasing fluid intake and taking a stool softener will usually help or prevent this problem from occurring.  A mild laxative (Milk of Magnesia or Miralax) should be taken according to package directions if there are no bowel movements after 48 hours. 7. Unless discharge instructions indicate otherwise, you may remove your bandages 48 hours after surgery and you may shower at that time.  You may have steri-strips (small skin tapes) in place directly over the incision.  These strips  should be left on the skin for 7-10 days and will come off on their own.  If your surgeon used skin glue on the incision, you may shower in 24 hours.  The glue will flake off over the next 2-3 weeks.  Any sutures or staples will be removed at the office during your follow-up visit. 8. ACTIVITIES:  You may resume regular daily activities (gradually increasing) beginning the next day.  Wearing a good support bra or sports bra minimizes pain and swelling.  You may have sexual intercourse when it is comfortable. a. You may drive when you no longer are taking prescription pain medication, you can comfortably wear a seatbelt, and you can safely maneuver your car and apply brakes. b. RETURN TO WORK:  ______________________________________________________________________________________ 9. You should see your doctor in the office for a follow-up appointment approximately two weeks after your surgery.  Your doctor's nurse will typically make your follow-up appointment when she calls you with your pathology report.  Expect your pathology report 3-4 business days after your surgery.  You may call to check if you do not hear from [place] after three days. 10. OTHER INSTRUCTIONS: _______________________________________________________________________________________________ _____________________________________________________________________________________________________________________________________ _____________________________________________________________________________________________________________________________________ _____________________________________________________________________________________________________________________________________  WHEN TO CALL DR Marbeth Smedley: 1. Fever over 101.0 2. Nausea and/or vomiting. 3. Extreme swelling or bruising. 4. Continued bleeding from incision. 5. Increased pain, redness, or drainage from the incision.  The clinic staff is available to answer your  questions during regular business hours.  Please don't hesitate to call and ask to speak to one of the nurses for  clinical concerns.  If you have a medical emergency, go to the nearest emergency room or call 911.  A surgeon from [facility] is always on call at the hospital.  For further questions, please visit [email or web address]

## 2012-03-06 NOTE — Op Note (Signed)
Preoperative diagnosis: Left breast cancer status post lumpectomy axillary lymph node dissection Postoperative diagnosis: Same as above Procedure: Left axillary node wire-guided excisional biopsy Surgeon: Dr. Harden Mo Anesthesia: Gen. Estimated blood loss: 10 cc Drains: None Complications: None Specimens: Left axillary nodal tissue Sponge needle count correct x2 Operation Disposition to recovery room in stable condition  Indications: This is a 46 yo female who had 2 separate areas of breast cancer in her left upper outer quadrant as well as a core biopsy of an abnormal lymph node there was metastatic carcinoma it was stated to be her lower axilla upon diagnosis. She underwent a lumpectomy with negative margins for these 2 tumors. She also underwent axillary lymph node dissection that showed all negative lymph nodes. I was concerned about her preoperative biopsy and I ordered a chest CT scan. This did show an additional enlarged node. I discussed with her it was very important to remove this node for future therapy given the positive biopsy of as well as a negative lymph nodes. We discussed a wire-guided biopsy.  Procedure: After informed consent was obtained the patient was taken first to the [facility] showed a wire placed through this node. I had these images available for my review in the operating room. The markings where these appeared to be were actually in the lateral portion of my lumpectomy cavity and not in her axilla proper. She had  sequential compression devices placed her legs prior to beginning. She was administered 4 mg of IV ciprofloxacin for allergies. She was then placed under general anesthesia with an LMA. Her left breast and axilla was then prepped and draped in the standard sterile surgical fashion. A surgical timeout was then performed.  I extended my lumpmy incision a little bit laterally. I then brought the wire and remotely. I used the cautery to excise the wire  and the surrounding tissue which appeared to be 2 lymph nodes. There were no other abnormalities in this area upon completion. This cavity was actually the lateralmost extent of my lumpectomy avity and the clips that had been placed previously were from my lumpectomy. I then obtained hemostasis. Irrigation was performed. A close this in several layers with 2-0 Vicryl 3-0 Vicryl, and 4-0 Monocryl. Steri-Strips and a sterile dressing were placed. I infiltrated 10 cc of quarter percent Marcaine throughout this region as well. She tolerated this well was activated and returned to recovery in stable condition.

## 2012-03-06 NOTE — Interval H&P Note (Signed)
History and Physical Interval Note:  03/06/2012 12:13 PM  Alisha Chen  has presented today for surgery, with the diagnosis of left axillary node  The various methods of treatment have been discussed with the patient and family. After consideration of risks, benefits and other options for treatment, the patient has consented to  Procedure(s) (LRB): AXILLARY LYMPH NODE BIOPSY (Left) as a surgical intervention .  The patients' history has been reviewed, patient examined, no change in status, stable for surgery.  I have reviewed the patients' chart and labs.  Questions were answered to the patient's satisfaction.     Archit Leger

## 2012-03-06 NOTE — Anesthesia Preprocedure Evaluation (Signed)
Anesthesia Evaluation  Patient identified by MRN, date of birth, ID band Patient awake    Reviewed: Allergy & Precautions, H&P , NPO status , Patient's Chart, lab work & pertinent test results  Airway Mallampati: I TM Distance: >3 FB Neck ROM: Full    Dental   Pulmonary Recent URI ,  sinusitis   Pulmonary exam normal       Cardiovascular     Neuro/Psych    GI/Hepatic   Endo/Other    Renal/GU      Musculoskeletal   Abdominal   Peds  Hematology   Anesthesia Other Findings   Reproductive/Obstetrics Breast ca                           Anesthesia Physical Anesthesia Plan  ASA: II  Anesthesia Plan: General   Post-op Pain Management:    Induction: Intravenous  Airway Management Planned: LMA  Additional Equipment:   Intra-op Plan:   Post-operative Plan: Extubation in OR  Informed Consent: I have reviewed the patients History and Physical, chart, labs and discussed the procedure including the risks, benefits and alternatives for the proposed anesthesia with the patient or authorized representative who has indicated his/her understanding and acceptance.     Plan Discussed with: CRNA and Surgeon  Anesthesia Plan Comments:         Anesthesia Quick Evaluation

## 2012-03-06 NOTE — H&P (View-Only) (Signed)
Subjective:     Patient ID: Alisha Chen, female   DOB: 07-13-1966, 46 y.o.   MRN: [id number]  HPI This is a 46 year old female who had node positive breast cancer  She underwent lumpectomy with a negative margin. 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 All 19 of her nodes were negative. I was concerned that she had positive nodes are present I may have not taken out this note. I obtained a chest CT which does show a residual note. She comes back in today to discuss these issues. She otherwise is doing well.  Review of Systems CT CHEST WITH CONTRAST  Technique: Multidetector CT imaging of the chest was performed  following the standard protocol during bolus administration of  intravenous contrast.  Contrast: 75 ml of omni 300  Comparison: 38/11/13  Findings:  Surgical clips are noted within the axillary tail of the left  breast  There are postsurgical changes and fat stranding within the left  axilla. Enlarged left axillary lymph node is again noted. This  has a short axis diameter of 1.4 cm, image 30. Previously 1.6 cm.  No new or progressive axillary adenopathy. There are no enlarged  right axillary lymph nodes.  No supraclavicular adenopathy.  There is no mediastinal or hilar adenopathy. No pericardial or  pleural effusion.  The lungs are clear. No suspicious pulmonary parenchymal nodule or  mass.  The tracheobronchial tree appears normal.  Review of the visualized bony structures is unremarkable.  IMPRESSION:  1. Residual left axillary lymph node measures 1.4 cm.  2. Postop changes within the axillary tail of the left breast and  left axilla.     Objective:   Physical Exam Well healing incision in breast and axilla, no masses identified    Assessment:     Left breast cancer s/p lump/alnd/port placement    Plan:     I'm concerned with the residual node on her CAT scan as well as the pathology report that she still may have one node that is positive for breast cancer.  I think it is important for decisions for chemotherapy to ensure that she had node positive disease as well.  I think that the best course of action after reviewing with radiology will proceed with ultrasound of this note is identifiable doing a wire-guided biopsy. I discussed the indications for this. I also discussed her with medical oncology this morning. If this is identified by ultrasound we will plan on doing a left axillary node biopsy with wire guidance.

## 2012-03-06 NOTE — Anesthesia Procedure Notes (Signed)
Procedure Name: LMA Insertion Performed by: Maciah Feeback W Pre-anesthesia Checklist: Patient identified, Timeout performed, Emergency Drugs available, Suction available and Patient being monitored Patient Re-evaluated:Patient Re-evaluated prior to inductionOxygen Delivery Method: Circle system utilized Preoxygenation: Pre-oxygenation with 100% oxygen Intubation Type: IV induction Ventilation: Mask ventilation without difficulty LMA: LMA inserted LMA Size: 4.0 Number of attempts: 1 Placement Confirmation: breath sounds checked- equal and bilateral and positive ETCO2 Tube secured with: Tape Dental Injury: Teeth and Oropharynx as per pre-operative assessment      

## 2012-03-06 NOTE — Transfer of Care (Signed)
Immediate Anesthesia Transfer of Care Note  Patient: Alisha Chen  Procedure(s) Performed: Procedure(s) (LRB): AXILLARY LYMPH NODE BIOPSY (Left)  Patient Location: PACU  Anesthesia Type: General  Level of Consciousness: awake, alert  and oriented  Airway & Oxygen Therapy: Patient Spontanous Breathing and Patient connected to face mask oxygen  Post-op Assessment: Report given to PACU RN and Post -op Vital signs reviewed and stable  Post vital signs: Reviewed and stable  Complications: No apparent anesthesia complications

## 2012-03-06 NOTE — Anesthesia Postprocedure Evaluation (Signed)
  Anesthesia Post-op Note  Patient: Alisha Chen  Procedure(s) Performed: Procedure(s) (LRB): AXILLARY LYMPH NODE BIOPSY (Left)  Patient Location: PACU  Anesthesia Type: General  Level of Consciousness: awake  Airway and Oxygen Therapy: Patient Spontanous Breathing  Post-op Pain: mild  Post-op Assessment: Post-op Vital signs reviewed, Patient's Cardiovascular Status Stable, Respiratory Function Stable, Patent Airway, No signs of Nausea or vomiting, Adequate PO intake and Pain level controlled  Post-op Vital Signs: stable  Complications: No apparent anesthesia complications

## 2012-03-08 ENCOUNTER — Telehealth: Payer: Self-pay | Admitting: Oncology

## 2012-03-08 ENCOUNTER — Other Ambulatory Visit: Payer: BC Managed Care – PPO

## 2012-03-08 ENCOUNTER — Other Ambulatory Visit: Payer: Self-pay | Admitting: Oncology

## 2012-03-08 NOTE — Telephone Encounter (Signed)
S/w the pt and she is aware of her appt on 03/09/2012@4 :00pm with dr Welton Flakes

## 2012-03-09 ENCOUNTER — Ambulatory Visit (HOSPITAL_BASED_OUTPATIENT_CLINIC_OR_DEPARTMENT_OTHER): Payer: BC Managed Care – PPO | Admitting: Oncology

## 2012-03-09 VITALS — BP 121/86 | HR 91 | Temp 98.4°F | Ht 67.0 in | Wt 171.2 lb

## 2012-03-09 DIAGNOSIS — Z170 Estrogen receptor positive status [ER+]: Secondary | ICD-10-CM

## 2012-03-09 DIAGNOSIS — C50419 Malignant neoplasm of upper-outer quadrant of unspecified female breast: Secondary | ICD-10-CM

## 2012-03-12 ENCOUNTER — Encounter (HOSPITAL_BASED_OUTPATIENT_CLINIC_OR_DEPARTMENT_OTHER): Payer: Self-pay

## 2012-03-12 ENCOUNTER — Ambulatory Visit: Payer: BC Managed Care – PPO | Admitting: Oncology

## 2012-03-15 MED ORDER — DEXAMETHASONE 4 MG PO TABS: ORAL_TABLET | ORAL | Status: DC

## 2012-03-15 MED ORDER — LORAZEPAM 0.5 MG PO TABS: 0.5000 mg | ORAL_TABLET | Freq: Four times a day (QID) | ORAL | Status: DC | PRN

## 2012-03-15 MED ORDER — PROCHLORPERAZINE 25 MG RE SUPP: 25.0000 mg | Freq: Two times a day (BID) | RECTAL | Status: DC | PRN

## 2012-03-15 MED ORDER — PROCHLORPERAZINE MALEATE 10 MG PO TABS: 10.0000 mg | ORAL_TABLET | Freq: Four times a day (QID) | ORAL | Status: DC | PRN

## 2012-03-15 MED ORDER — ONDANSETRON HCL 8 MG PO TABS: ORAL_TABLET | ORAL | Status: DC

## 2012-03-15 NOTE — Progress Notes (Signed)
OFFICE PROGRESS NOTE  CC  [name], DO, DO [phone] [address]. [facility] [address] [place] [address] Dr. Emelia Loron Dr. 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 Patient is now status post left breast lumpectomy with left axillary lymph node dissection performed on 02/10/2012  PRIOR THERAPY:  #1 patient underwent a left breast lumpectomy that revealed 2 foci of invasive ductal carcinoma measuring 1.3 cm and 0.8 cm grade 2 with adjacent high-grade ductal carcinoma in situ with no evidence of angiolymphatic invasion. The tumor was estrogen receptor +97% progesterone receptor +100% HER-2/neu negative proliferation marker 48% second focus was ER +96% PR +99% HER-2/neu negative with a proliferation marker 74%. 1/22 lymph nodes were examined all of which were positive for metastatic disease.   CURRENT THERAPY: To begin adjuvant chemotherapy  INTERVAL HISTORY: Alisha Chen 46 y.o. female returns for Followup visit. She had a reexcision performed and one lymph node was positive for metastatic disease. She returns today to decide whether she should be treated with adjuvant chemotherapy or not. Patient was very tearful throughout her visit today. She is very uncertain about putting chemicals in her body. She still continues to have pain in her left lumpectomy site and the recent axillary lymph node dissection. She otherwise denies any nausea vomiting fevers chills. Patient is extremely anxious and again very tearful. She was accompanied by her family today. Remainder of the 10 point review of systems was negative.  MEDICAL HISTORY: Past Medical History  Diagnosis Date  . Back pain   . Hx: UTI (urinary tract infection)   . Cancer     LT BREAST    ALLERGIES:  is allergic to  shellfish-derived products; clarithromycin; and penicillins.  MEDICATIONS:  Current Outpatient Prescriptions  Medication Sig Dispense Refill  . ferrous sulfate 325 (65 FE) MG EC tablet Take 325 mg by mouth 3 (three) times daily with meals.        SURGICAL HISTORY:  Past Surgical History  Procedure Date  . Spine surgery 01/2011  . Foot surgery     multiple  . Back surgery     LUMBAR DECOMPRESSION  . Portacath placement 02/10/2012    Procedure: INSERTION PORT-A-CATH;  Surgeon: Emelia Loron, MD;  Location: [facility];  Service: General;  Laterality: Right;  . Breast surgery 2013    left breast lumpectomy, left alnd    REVIEW OF SYSTEMS:  Pertinent items are noted in HPI.   PHYSICAL EXAMINATION: General appearance: alert, cooperative and appears stated age Lymph nodes: Cervical, supraclavicular, and axillary nodes normal. Resp: clear to auscultation bilaterally Back: symmetric, no curvature. ROM normal. No CVA tenderness. Cardio: regular rate and rhythm, S1, S2 normal, no murmur, click, rub or gallop GI: soft, non-tender; bowel sounds normal; no masses,  no organomegaly Extremities: extremities normal, atraumatic, no cyanosis or edema Neurologic: Grossly normal  ECOG PERFORMANCE STATUS: 1 - Symptomatic but completely ambulatory  Blood pressure 121/86, pulse 91, temperature 98.4 F (36.9 C), temperature source Oral, height 5\' 7"  (1.702 m), weight 171 lb 3.2 oz (77.656 kg), last menstrual period 02/27/2012.  LABORATORY DATA: Lab Results  Component Value Date   WBC 5.5 02/17/2012   HGB 11.3* 02/17/2012   HCT 35.1 02/17/2012   MCV 87.4 02/17/2012   PLT 209 02/17/2012  Chemistry      Component Value Date/Time   NA 137 02/17/2012 1355   K 4.6 02/17/2012 1355   CL 104 02/17/2012 1355   CO2 25 02/17/2012 1355   BUN 12 02/17/2012 1355   CREATININE 0.87 02/17/2012 1355      Component Value Date/Time   CALCIUM 9.9 02/17/2012 1355   ALKPHOS 71 02/17/2012 1355    AST 19 02/17/2012 1355   ALT 17 02/17/2012 1355   BILITOT 0.3 02/17/2012 1355       RADIOGRAPHIC STUDIES:  Ct Chest W Contrast  02/24/2012  *RADIOLOGY REPORT*  Clinical Data: Breast cancer.  Evaluate for adenopathy.  CT CHEST WITH CONTRAST  Technique:  Multidetector CT imaging of the chest was performed following the standard protocol during bolus administration of intravenous contrast.  Contrast:  75 ml of omni 300  Comparison: 38/11/13  Findings:  Surgical clips are noted within the axillary tail of the left breast  There are postsurgical changes and fat stranding within the left axilla.  Enlarged left axillary lymph node is again noted.  This has a short axis diameter of 1.4 cm, image 30.  Previously 1.6 cm.  No new or progressive axillary adenopathy.  There are no enlarged right axillary lymph nodes.  No supraclavicular adenopathy.  There is no mediastinal or hilar adenopathy.  No pericardial or pleural effusion.  The lungs are clear.  No suspicious pulmonary parenchymal nodule or mass.  The tracheobronchial tree appears normal.  Review of the visualized bony structures is unremarkable.  IMPRESSION:  1.  Residual left axillary lymph node measures 1.4 cm. 2.  Postop changes within the axillary tail of the left breast and left axilla.  Original Report Authenticated By: Rosealee Albee, M.D.   US Breast Left  03/01/2012  *RADIOLOGY REPORT*  Clinical Data:  left breast carcinoma, with recent CT evidence of enlarged left axillary lymph node  LEFT BREAST ULTRASOUND  Comparison:  recent CT scan of the thorax  On physical exam, there are no palpable abnormalities.  Findings: Ultrasound is performed, showing two adjacent lymph nodes in the inferior left axilla.  One demonstrates equivocal cortical thickening with a definitively preserved fatty hilum, and measures 25 x 10 x 27mm.  Immediately inferior and medial to this is a second lymph node, measuring 19 x 14 x 10mm, which demonstrates an abnormal appearance  in that no hilum is evident.  IMPRESSION: Two adjacent low axillary lymph nodes, one of which demonstrates an abnormal appearance, while the other demonstrates only mild cortical thickening.  BI-RADS CATEGORY 6:  Known biopsy-proven malignancy - appropriate action should be taken.  RECOMMENDATION:  The patient is scheduled for resection of these lymph nodes next week, having already undergone axillary node dissection on the left recently.  Ultrasound-guided wire localalization prior to resection could be considered to aid in preoperative identification.  Original Report Authenticated By: Phineas Inches   Dg Chest Port 1 View  02/10/2012  *RADIOLOGY REPORT*  Clinical Data: Port placement  PORTABLE CHEST - 1 VIEW  Comparison: 01/30/2012  Findings: Right subclavian vein Port-A-Cath placed with its tip at the cavoatrial junction.  No pneumothorax.  Mild cardiomegaly. Clear lungs.  IMPRESSION: Right subclavian vein Port-A-Cath placement with its tip at the cavoatrial junction and no pneumothorax.  Original Report Authenticated By: Donavan Burnet, M.D.   Mm Breast Surgical Specimen  02/10/2012  *RADIOLOGY REPORT*  Clinical Data:  Two foci of carcinoma left upper outer quadrant  NEEDLE LOCALIZATION WITH MAMMOGRAPHIC GUIDANCE AND SPECIMEN  RADIOGRAPH  Patient presents for needle localization prior to lumpectomy.  I met with the patient and we discussed the procedure of needle localization including benefits and alternatives. We discussed the high likelihood of a successful procedure. We discussed the risks of the procedure, including infection, bleeding, tissue injury, and further surgery. Informed, written consent was given.  Using mammographic guidance, sterile technique, 2% lidocaine andtwo 5 cm modified Kopans needles, both marker clips were localized using a lateral medial approach.  The films are marked for Dr. Dwain Sarna.  Specimen radiograph was performed at [facility], and confirms both wires and marker clips present in  the tissue sample.  The specimen is marked for pathology.  IMPRESSION: Needle localization left breast.  No apparent complications.  Original Report Authenticated By: Phineas Inches   Mm Breast Wire Localization Left  02/10/2012  *RADIOLOGY REPORT*  Clinical Data:  Two foci of carcinoma left upper outer quadrant  NEEDLE LOCALIZATION WITH MAMMOGRAPHIC GUIDANCE AND SPECIMEN RADIOGRAPH  Patient presents for needle localization prior to lumpectomy.  I met with the patient and we discussed the procedure of needle localization including benefits and alternatives. We discussed the high likelihood of a successful procedure. We discussed the risks of the procedure, including infection, bleeding, tissue injury, and further surgery. Informed, written consent was given.  Using mammographic guidance, sterile technique, 2% lidocaine andtwo 5 cm modified Kopans needles, both marker clips were localized using a lateral medial approach.  The films are marked for Dr. Dwain Sarna.  Specimen radiograph was performed at Day Surgery, and confirms both wires and marker clips present in the tissue sample.  The specimen is marked for pathology.  IMPRESSION: Needle localization left breast.  No apparent complications.  Original Report Authenticated By: Estil Daft Guide Cv Line-no Report  02/10/2012  CLINICAL DATA: Right Port-A-Cath placement   FLOURO GUIDE CV LINE  Fluoroscopy was utilized by the requesting physician.  No radiographic  interpretation.      ASSESSMENT: 46 year old female with:  1.  stage II a (T1 CN1) invasive ductal carcinoma status post left lumpectomy with excellent lymph node dissection. The primary tumor revealed 2 foci measuring 1.3 and 0.8 cm.with 1/22 LN positive for metastatic disease  #2 patient will begin adjuvant chemotherapy. We had discussed her adjuvant treatment when she originally presented at the multidisciplinary breast clinic. My recommendation was for the patient to receive 5-FU epirubicin and  cyclophosphamide followed by weekly Taxol for a total of 12 weeks. The FVC would be given in a dose dense fashion every 2 weeks for a total of 4 cycles with day 2 Neulasta. Risks and benefits of the chemotherapy have been discussed with the patient.  PLAN:   #1 patient will be set up to begin her chemotherapy starting in the next one to 2 weeks. Risks and benefits of the treatment are explained to the patient.  #2 anti-emetics will be sent to her pharmacy of choice.  #3 I will see her back in 2 weeks with her cycle 1 of treatment.  All questions were answered. The patient knows to call the clinic with any problems, questions or concerns. We can certainly see the patient much sooner if necessary.  I spent 20 minutes counseling the patient face to face. The total time spent in the appointment was 30 minutes.    Drue Second, MD Medical/Oncology [facility] [phone] (beeper) [phone] (Office)  03/15/2012, 6:17 PM

## 2012-03-16 ENCOUNTER — Other Ambulatory Visit: Payer: Self-pay | Admitting: Medical Oncology

## 2012-03-16 ENCOUNTER — Encounter: Payer: Self-pay | Admitting: Medical Oncology

## 2012-03-16 DIAGNOSIS — C50419 Malignant neoplasm of upper-outer quadrant of unspecified female breast: Secondary | ICD-10-CM

## 2012-03-16 MED ORDER — PROCHLORPERAZINE 25 MG RE SUPP: 25.0000 mg | Freq: Two times a day (BID) | RECTAL | Status: DC | PRN

## 2012-03-16 MED ORDER — ONDANSETRON HCL 8 MG PO TABS: ORAL_TABLET | ORAL | Status: DC

## 2012-03-16 MED ORDER — PROCHLORPERAZINE MALEATE 10 MG PO TABS: 10.0000 mg | ORAL_TABLET | Freq: Four times a day (QID) | ORAL | Status: DC | PRN

## 2012-03-16 MED ORDER — DEXAMETHASONE 4 MG PO TABS: ORAL_TABLET | ORAL | Status: DC

## 2012-03-16 NOTE — Telephone Encounter (Signed)
Called patient to get name of pharmacy to send prescriptions to.  [facility] [address]

## 2012-03-16 NOTE — Telephone Encounter (Signed)
Sent dr Welton Flakes an email regarding the may,june appt dates for time slots and also sent michelle an staff message regarding the chemo appts. She is just waiitng for me to add the md visits

## 2012-03-19 ENCOUNTER — Telehealth: Payer: Self-pay | Admitting: *Deleted

## 2012-03-19 ENCOUNTER — Telehealth: Payer: Self-pay | Admitting: Oncology

## 2012-03-19 ENCOUNTER — Encounter (INDEPENDENT_AMBULATORY_CARE_PROVIDER_SITE_OTHER): Payer: Self-pay

## 2012-03-19 ENCOUNTER — Telehealth (INDEPENDENT_AMBULATORY_CARE_PROVIDER_SITE_OTHER): Payer: Self-pay | Admitting: General Surgery

## 2012-03-19 ENCOUNTER — Telehealth (INDEPENDENT_AMBULATORY_CARE_PROVIDER_SITE_OTHER): Payer: Self-pay

## 2012-03-19 ENCOUNTER — Other Ambulatory Visit: Payer: Self-pay | Admitting: Medical Oncology

## 2012-03-19 DIAGNOSIS — C50419 Malignant neoplasm of upper-outer quadrant of unspecified female breast: Secondary | ICD-10-CM

## 2012-03-19 NOTE — Telephone Encounter (Signed)
Per staff message from [place], I have scheduled the patient's treatments. Crystal aware appts in computer.  JMW

## 2012-03-19 NOTE — Telephone Encounter (Signed)
Called pt to notify her that I did fax RTW note to Alfonso Patten fx [phone].

## 2012-03-19 NOTE — Telephone Encounter (Signed)
Pt is aware to pick up her schedules at that time

## 2012-03-19 NOTE — Progress Notes (Signed)
Spoke with patient regarding ECHO.  Informed patient that scheduling is in the process of working on her appointments.  Advised patient that she should be expecting call from scheduling to confirm date and time for appointments for ECHO and lab/MD/chemo.  Oncology treatment schedule sent to schedulers.

## 2012-03-19 NOTE — Telephone Encounter (Signed)
S/w the pt and she is aware of her echo appt on 03/21/2012 and her lab,md,tx 's on 03/23/2012@12 :00pm

## 2012-03-19 NOTE — Telephone Encounter (Signed)
The patient called and states she has a temperature of 100.1.  She had a lumpectomy on 4/16.  She has been taking Tylenol but it's not getting the temp down.  Her incision is not red or draining.  She has swelling in her axilla.  I asked her to alternate Ibuprofen with the Tylenol and call us if 101.5 or higher.  I told her the axillary swelling can occur after lymph node surgery and the body usually reabsorbs it.  I asked her to call if it keeps swelling and causes pain or if she has redness.

## 2012-03-19 NOTE — Telephone Encounter (Signed)
Pt calling for RTW letter.  She states she is returning tomorrow and needs the letter Leandra Kern to Attn: Alfonso Patten at [phone].  Please call her cell [phone]) to let her know this is done.  Thanks.

## 2012-03-20 ENCOUNTER — Other Ambulatory Visit (INDEPENDENT_AMBULATORY_CARE_PROVIDER_SITE_OTHER): Payer: Self-pay | Admitting: General Surgery

## 2012-03-20 ENCOUNTER — Encounter (INDEPENDENT_AMBULATORY_CARE_PROVIDER_SITE_OTHER): Payer: Self-pay | Admitting: General Surgery

## 2012-03-20 ENCOUNTER — Ambulatory Visit (INDEPENDENT_AMBULATORY_CARE_PROVIDER_SITE_OTHER): Payer: BC Managed Care – PPO | Admitting: General Surgery

## 2012-03-20 VITALS — BP 118/80 | HR 76 | Temp 100.2°F | Ht 67.0 in | Wt 174.6 lb

## 2012-03-20 DIAGNOSIS — N611 Abscess of the breast and nipple: Secondary | ICD-10-CM

## 2012-03-20 DIAGNOSIS — N61 Mastitis without abscess: Secondary | ICD-10-CM

## 2012-03-20 NOTE — Patient Instructions (Signed)
Dr. Derrell Lolling aspirated some fluid from your left breast wound today, and it looks like clear yellow fluid. It may or may not be infected. We sent the fluid for culture. You have been given a prescription for doxycycline antibiotics take for 7 days. This should get better and the fever should go away.  Keep your appointment with Dr. Dwain Sarna on May 6.

## 2012-03-20 NOTE — Progress Notes (Signed)
Patient ID: Alisha Chen, female   DOB: [birth year], 46 y.o.   MRN: [id number]  This is a patient of Dr. Doreen Salvage who is being seen in the office today for swelling of her left breast wound and low-grade fever.  On March 22 she underwent left partial mastectomy, reexcision medial margin, axillary lymph node dissection and Port-A-Cath placement. She was returned to the operating room on April 18 for a wire localized left axillary lymph node biopsy.  She is seen in the office today because of low-grade fever and swelling. The patient doesn't have any chills and the  patient doesn't really have much pain. She has not had any drainage.     Exam: Patient looks well and in no distress. Temp100.2.  HR 57  Breasts:    left breast incision and left axillary incision do not look infected. There is a small seroma just above the lateral aspect of the lumpectomy scar. The axillary scar does not feel like it has any fluid in it. There is a little bit of puffiness of the soft tissues down the chest wall but no fluid there. None of this is tender.  Following alcohol prep and local anesthesia I aspirated about 5 cc of serous fluid from the lateral aspect of the lumpectomy scar. A small bandage was placed.  Assessment: Small wound seroma left breast lumpectomy site. This may or may not be infected.  No evidence of overt cellulitis or abscess  Plan: Doxycycline 100 mg p.o. b.i.d. x7 days  Keep her appointment with Dr. Dwain Sarna on May 6.  Angelia Mould. Derrell Lolling, M.D., [facility] General and Minimally invasive Surgery Breast and Colorectal Surgery Office:   [phone] Pager:   [phone]

## 2012-03-21 ENCOUNTER — Ambulatory Visit (HOSPITAL_COMMUNITY)
Admission: RE | Admit: 2012-03-21 | Discharge: 2012-03-21 | Disposition: A | Discharge status: Home / Self Care | Payer: BC Managed Care – PPO | Source: Ambulatory Visit | Attending: Oncology | Admitting: Oncology

## 2012-03-21 DIAGNOSIS — Z01818 Encounter for other preprocedural examination: Secondary | ICD-10-CM | POA: Insufficient documentation

## 2012-03-21 DIAGNOSIS — Z5111 Encounter for antineoplastic chemotherapy: Secondary | ICD-10-CM

## 2012-03-21 DIAGNOSIS — C50419 Malignant neoplasm of upper-outer quadrant of unspecified female breast: Secondary | ICD-10-CM | POA: Insufficient documentation

## 2012-03-21 NOTE — Progress Notes (Signed)
  Echocardiogram 2D Echocardiogram has been performed.  Braden Cimo L 03/21/2012, 11:55 AM

## 2012-03-22 ENCOUNTER — Encounter (INDEPENDENT_AMBULATORY_CARE_PROVIDER_SITE_OTHER): Payer: Self-pay

## 2012-03-22 ENCOUNTER — Telehealth (INDEPENDENT_AMBULATORY_CARE_PROVIDER_SITE_OTHER): Payer: Self-pay

## 2012-03-22 NOTE — Telephone Encounter (Signed)
Patient called requesting a Return to work note with date change fax to Ms. Yetta Flock, return date changed to 03/22/2012.

## 2012-03-23 ENCOUNTER — Ambulatory Visit (HOSPITAL_BASED_OUTPATIENT_CLINIC_OR_DEPARTMENT_OTHER): Payer: BC Managed Care – PPO

## 2012-03-23 ENCOUNTER — Ambulatory Visit: Payer: BC Managed Care – PPO | Admitting: Oncology

## 2012-03-23 ENCOUNTER — Other Ambulatory Visit: Payer: BC Managed Care – PPO

## 2012-03-23 ENCOUNTER — Encounter: Payer: Self-pay | Admitting: *Deleted

## 2012-03-23 VITALS — BP 134/84 | HR 76 | Temp 98.5°F | Ht 67.0 in | Wt 174.4 lb

## 2012-03-23 DIAGNOSIS — Z5111 Encounter for antineoplastic chemotherapy: Secondary | ICD-10-CM

## 2012-03-23 DIAGNOSIS — Z170 Estrogen receptor positive status [ER+]: Secondary | ICD-10-CM

## 2012-03-23 DIAGNOSIS — C50419 Malignant neoplasm of upper-outer quadrant of unspecified female breast: Secondary | ICD-10-CM

## 2012-03-23 LAB — CBC WITH DIFFERENTIAL/PLATELET
Basophils Absolute: 0.1 10*3/uL (ref 0.0–0.1)
Eosinophils Absolute: 0.2 10*3/uL (ref 0.0–0.5)
HCT: 34.2 % — ABNORMAL LOW (ref 34.8–46.6)
HGB: 11.3 g/dL — ABNORMAL LOW (ref 11.6–15.9)
LYMPH%: 22.8 % (ref 14.0–49.7)
MCV: 86.1 fL (ref 79.5–101.0)
MONO#: 0.4 10*3/uL (ref 0.1–0.9)
MONO%: 7 % (ref 0.0–14.0)
NEUT#: 3.8 10*3/uL (ref 1.5–6.5)
NEUT%: 66.2 % (ref 38.4–76.8)
Platelets: 163 10*3/uL (ref 145–400)
RBC: 3.97 10*6/uL (ref 3.70–5.45)
WBC: 5.8 10*3/uL (ref 3.9–10.3)
nRBC: 0 % (ref 0–0)

## 2012-03-23 LAB — COMPREHENSIVE METABOLIC PANEL
ALT: 16 U/L (ref 0–35)
CO2: 23 mEq/L (ref 19–32)
Calcium: 9.8 mg/dL (ref 8.4–10.5)
Chloride: 106 mEq/L (ref 96–112)
Creatinine, Ser: 0.78 mg/dL (ref 0.50–1.10)
Glucose, Bld: 101 mg/dL — ABNORMAL HIGH (ref 70–99)
Total Bilirubin: 0.4 mg/dL (ref 0.3–1.2)
Total Protein: 6.7 g/dL (ref 6.0–8.3)

## 2012-03-23 LAB — WOUND CULTURE: Gram Stain: NONE SEEN

## 2012-03-23 MED ORDER — DEXAMETHASONE SODIUM PHOSPHATE 4 MG/ML IJ SOLN
12.0000 mg | Freq: Once | INTRAMUSCULAR | Status: AC
  Administered 2012-03-23: 12 mg via INTRAVENOUS

## 2012-03-23 MED ORDER — HEPARIN SOD (PORK) LOCK FLUSH 100 UNIT/ML IV SOLN
500.0000 [IU] | Freq: Once | INTRAVENOUS | Status: AC | PRN
  Administered 2012-03-23: 500 [IU]
  Filled 2012-03-23: qty 5

## 2012-03-23 MED ORDER — SODIUM CHLORIDE 0.9 % IV SOLN
150.0000 mg | Freq: Once | INTRAVENOUS | Status: AC
  Administered 2012-03-23: 150 mg via INTRAVENOUS
  Filled 2012-03-23: qty 5

## 2012-03-23 MED ORDER — SODIUM CHLORIDE 0.9 % IJ SOLN
10.0000 mL | INTRAMUSCULAR | Status: DC | PRN
  Administered 2012-03-23: 10 mL
  Filled 2012-03-23: qty 10

## 2012-03-23 MED ORDER — LORAZEPAM 1 MG PO TABS
0.5000 mg | ORAL_TABLET | Freq: Once | ORAL | Status: AC
  Administered 2012-03-23: 0.5 mg via ORAL

## 2012-03-23 MED ORDER — FLUOROURACIL CHEMO INJECTION 2.5 GM/50ML
500.0000 mg/m2 | Freq: Once | INTRAVENOUS | Status: AC
  Administered 2012-03-23: 950 mg via INTRAVENOUS
  Filled 2012-03-23: qty 19

## 2012-03-23 MED ORDER — SODIUM CHLORIDE 0.9 % IV SOLN
500.0000 mg/m2 | Freq: Once | INTRAVENOUS | Status: AC
  Administered 2012-03-23: 960 mg via INTRAVENOUS
  Filled 2012-03-23: qty 48

## 2012-03-23 MED ORDER — EPIRUBICIN HCL CHEMO IV INJECTION 200 MG/100ML
100.0000 mg/m2 | Freq: Once | INTRAVENOUS | Status: AC
  Administered 2012-03-23: 192 mg via INTRAVENOUS
  Filled 2012-03-23: qty 96

## 2012-03-23 MED ORDER — LIDOCAINE-PRILOCAINE 2.5-2.5 % EX CREA: TOPICAL_CREAM | CUTANEOUS | Status: DC | PRN

## 2012-03-23 MED ORDER — PALONOSETRON HCL INJECTION 0.25 MG/5ML
0.2500 mg | Freq: Once | INTRAVENOUS | Status: AC
  Administered 2012-03-23: 0.25 mg via INTRAVENOUS

## 2012-03-23 MED ORDER — SODIUM CHLORIDE 0.9 % IV SOLN: Freq: Once | INTRAVENOUS | Status: DC

## 2012-03-23 NOTE — Progress Notes (Signed)
OFFICE PROGRESS NOTE  CC  [name], DO, DO [phone] W. [facility] [address] [place] [address] Dr. Emelia Loron Dr. 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 Patient is now status post left breast lumpectomy with left axillary lymph node dissection performed on 02/10/2012  PRIOR THERAPY:  #1 patient underwent a left breast lumpectomy that revealed 2 foci of invasive ductal carcinoma measuring 1.3 cm and 0.8 cm grade 2 with adjacent high-grade ductal carcinoma in situ with no evidence of angiolymphatic invasion. The tumor was estrogen receptor +97% progesterone receptor +100% HER-2/neu negative proliferation marker 48% second focus was ER +96% PR +99% HER-2/neu negative with a proliferation marker 74%. 1/22 lymph nodes were examined all of which were positive for metastatic disease  .#2 She will start adjuvant chemotherapy with FEC 100 every 2 weeks with day 2 Neulasta. A total of 4 cycles of this as planned. Thereafter she will receive weekly Taxol for a total of 12 weeks. We will begin 03/23/2012   CURRENT THERAPY: adjuvant chemotherapy with FEC 100 q 2 weeks with day 2 neulasta. Cycle 1 day 1  INTERVAL HISTORY: BRADYN VASSEY 46 y.o. female returns for Followup visit.  MEDICAL HISTORY: Past Medical History  Diagnosis Date  . Back pain   . Hx: UTI (urinary tract infection)   . Cancer     LT BREAST    ALLERGIES:  is allergic to shellfish-derived products; clarithromycin; and penicillins.  MEDICATIONS:  Current Outpatient Prescriptions  Medication Sig Dispense Refill  . dexamethasone (DECADRON) 4 MG tablet Take 2 tablets by mouth once a day on the day after chemotherapy and then take 2 tablets two times a day for 2 days. Take with food.  30 tablet  1  . ferrous  sulfate 325 (65 FE) MG EC tablet Take 325 mg by mouth 3 (three) times daily with meals.      Marland Kitchen LORazepam (ATIVAN) 0.5 MG tablet Take 1 tablet (0.5 mg total) by mouth every 6 (six) hours as needed (Nausea or vomiting).  30 tablet  0  . ondansetron (ZOFRAN) 8 MG tablet Take 1 tablet two times a day as needed for nausea or vomiting starting on the third day after chemotherapy.  30 tablet  1  . prochlorperazine (COMPAZINE) 10 MG tablet Take 1 tablet (10 mg total) by mouth every 6 (six) hours as needed (Nausea or vomiting).  30 tablet  1  . prochlorperazine (COMPAZINE) 25 MG suppository Place 1 suppository (25 mg total) rectally every 12 (twelve) hours as needed for nausea.  12 suppository  3    SURGICAL HISTORY:  Past Surgical History  Procedure Date  . Spine surgery 01/2011  . Foot surgery     multiple  . Back surgery     LUMBAR DECOMPRESSION  . Portacath placement 02/10/2012    Procedure: INSERTION PORT-A-CATH;  Surgeon: Emelia Loron, MD;  Location:  SUR[facility]ERY CENTER;  Service: General;  Laterality: Right;  . Breast surgery 2013    left breast lumpectomy, left alnd    REVIEW OF SYSTEMS:  Pertinent items are noted in HPI.   PHYSICAL EXAMINATION: General appearance: alert, cooperative and appears stated age Lymph nodes: Cervical, supraclavicular, and axillary nodes normal. Resp: clear to auscultation bilaterally Back: symmetric, no curvature. ROM normal. No CVA tenderness. Cardio:  regular rate and rhythm, S1, S2 normal, no murmur, click, rub or gallop GI: soft, non-tender; bowel sounds normal; no masses,  no organomegaly Extremities: extremities normal, atraumatic, no cyanosis or edema Neurologic: Grossly normal  ECOG PERFORMANCE STATUS: 1 - Symptomatic but completely ambulatory  Blood pressure 134/84, pulse 76, temperature 98.5 F (36.9 C), height 5\' 7"  (1.702 m), weight 174 lb 6.4 oz (79.107 kg), last menstrual period 02/27/2012.  LABORATORY DATA: Lab Results    Component Value Date   WBC 5.8 03/23/2012   HGB 11.3* 03/23/2012   HCT 34.2* 03/23/2012   MCV 86.1 03/23/2012   PLT 163 03/23/2012      Chemistry      Component Value Date/Time   NA 137 02/17/2012 1355   K 4.6 02/17/2012 1355   CL 104 02/17/2012 1355   CO2 25 02/17/2012 1355   BUN 12 02/17/2012 1355   CREATININE 0.87 02/17/2012 1355      Component Value Date/Time   CALCIUM 9.9 02/17/2012 1355   ALKPHOS 71 02/17/2012 1355   AST 19 02/17/2012 1355   ALT 17 02/17/2012 1355   BILITOT 0.3 02/17/2012 1355       RADIOGRAPHIC STUDIES:  ASSESSMENT: 46 year old female with:  1.  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 The primary tumor revealed 2 foci measuring 1.3 and 0.8 cm.with 1/22 LN positive for metastatic disease  #2 patient will begin adjuvant chemotherapy today 03/23/12. We had discussed her adjuvant treatment when she originally presented at the multidisciplinary breast clinic. My recommendation was for the patient to receive 5-FU epirubicin and cyclophosphamide followed by weekly Taxol for a total of 12 weeks. The FEC would be given in a dose dense fashion every 2 weeks for a total of 4 cycles with day 2 Neulasta. Risks and benefits of the chemotherapy have been discussed with the patient.  PLAN:  1. We will proceed with her scheduled chemotherapy today which is FEC 100. Risks and benefits of treatment are discussed with the patient as with her family. She will be given anti-emetics with her chemotherapy.  2. She is also given prescriptions for her antiemetics to be filled at her pharmacy.   3.She will return in one week's time for interim lapse in followup.  All questions were answered. The patient knows to call the clinic with any problems, questions or concerns. We can certainly see the patient much sooner if necessary.  I spent > counseling the patient face to face. The total time spent in the appointment  was 30 minutes.    Drue Second, MD Medical/Oncology [facility] [phone] (beeper) [phone] (Office)  03/23/2012, 1:50 PM

## 2012-03-24 ENCOUNTER — Ambulatory Visit (HOSPITAL_BASED_OUTPATIENT_CLINIC_OR_DEPARTMENT_OTHER): Payer: BC Managed Care – PPO

## 2012-03-24 VITALS — BP 122/66 | HR 83 | Temp 97.8°F

## 2012-03-24 DIAGNOSIS — C50419 Malignant neoplasm of upper-outer quadrant of unspecified female breast: Secondary | ICD-10-CM

## 2012-03-24 DIAGNOSIS — Z5189 Encounter for other specified aftercare: Secondary | ICD-10-CM

## 2012-03-24 MED ORDER — PEGFILGRASTIM INJECTION 6 MG/0.6ML
6.0000 mg | Freq: Once | SUBCUTANEOUS | Status: AC
  Administered 2012-03-24: 6 mg via SUBCUTANEOUS

## 2012-03-26 ENCOUNTER — Ambulatory Visit (INDEPENDENT_AMBULATORY_CARE_PROVIDER_SITE_OTHER): Payer: BC Managed Care – PPO | Admitting: General Surgery

## 2012-03-26 ENCOUNTER — Encounter (INDEPENDENT_AMBULATORY_CARE_PROVIDER_SITE_OTHER): Payer: Self-pay | Admitting: General Surgery

## 2012-03-26 ENCOUNTER — Telehealth: Payer: Self-pay | Admitting: *Deleted

## 2012-03-26 ENCOUNTER — Encounter: Payer: Self-pay | Admitting: *Deleted

## 2012-03-26 VITALS — BP 122/78 | HR 70 | Temp 97.4°F | Resp 14 | Ht 67.0 in | Wt 177.5 lb

## 2012-03-26 DIAGNOSIS — Z09 Encounter for follow-up examination after completed treatment for conditions other than malignant neoplasm: Secondary | ICD-10-CM

## 2012-03-26 NOTE — Progress Notes (Signed)
Subjective:     Patient ID: Alisha Chen, female   DOB: 1966/05/28, 46 y.o.   MRN: [id number]  HPI  This is a 46 year old female whose history documented in my prior notes. She has undergone axillary dissection as well as lumpectomy with negative margins. She was seen by one of my partners a couple weeks ago for a little bit of a seroma that was symptomatic. This was aspirated and she was begun on antibiotics. Her culture was negative. She feels better at this point and there is only a small nodule that that site. She has begun her chemotherapy at this point and does feel side effects of chemotherapy. She comes in today for recheck of the seroma which is much improved. Review of Systems     Objective:   Physical Exam  Pulmonary/Chest:         Assessment:     S/p lump/alnd Resolving seroma    Plan:     I think this seroma will resolve on its own at this point. She is fairly asymptomatic. She is going to get her chemotherapy and will come back and see me upon completion. I asked her to call me sooner she has any problems before that.

## 2012-03-26 NOTE — Telephone Encounter (Signed)
Message copied by Augusto Garbe on Mon Mar 26, 2012 12:52 PM ------      Message from: Sherre Poot      Created: Fri Mar 23, 2012  4:00 PM      Regarding: chemo f/u call       1st time [facility], Dr Newt Lukes

## 2012-03-26 NOTE — Telephone Encounter (Signed)
Called patient at work.  Reports she is nauseated.  Has her anti-emetics but reports they make her drowsy.  Instructed the lorazepam will cause drowsiness, the prochlorperazine can for some people but the ondansetron should not.  Reports all that she's tried have made her drowsy.  Asked why her treatments have been scheduled for weekly instead of bi-weekly.  Explained the protocol for CEF is weekly x 12, the protocol for A/C is every three weeks x 4.  No further questions at this time.

## 2012-03-29 ENCOUNTER — Ambulatory Visit: Payer: BC Managed Care – PPO | Admitting: Oncology

## 2012-03-29 ENCOUNTER — Encounter: Payer: Self-pay | Admitting: Oncology

## 2012-03-29 ENCOUNTER — Ambulatory Visit (HOSPITAL_BASED_OUTPATIENT_CLINIC_OR_DEPARTMENT_OTHER): Payer: BC Managed Care – PPO

## 2012-03-29 ENCOUNTER — Other Ambulatory Visit (HOSPITAL_BASED_OUTPATIENT_CLINIC_OR_DEPARTMENT_OTHER): Payer: BC Managed Care – PPO | Admitting: Lab

## 2012-03-29 ENCOUNTER — Telehealth: Payer: Self-pay | Admitting: Oncology

## 2012-03-29 VITALS — BP 103/67 | HR 70

## 2012-03-29 VITALS — BP 119/72 | HR 73 | Temp 98.4°F | Ht 67.0 in | Wt 176.7 lb

## 2012-03-29 DIAGNOSIS — C50419 Malignant neoplasm of upper-outer quadrant of unspecified female breast: Secondary | ICD-10-CM

## 2012-03-29 LAB — CBC WITH DIFFERENTIAL/PLATELET
BASO%: 2 % (ref 0.0–2.0)
Eosinophils Absolute: 0.1 10*3/uL (ref 0.0–0.5)
HCT: 29.2 % — ABNORMAL LOW (ref 34.8–46.6)
MCHC: 32.5 g/dL (ref 31.5–36.0)
MONO#: 0 10*3/uL — ABNORMAL LOW (ref 0.1–0.9)
NEUT#: 0.8 10*3/uL — ABNORMAL LOW (ref 1.5–6.5)
Platelets: 126 10*3/uL — ABNORMAL LOW (ref 145–400)
RBC: 3.4 10*6/uL — ABNORMAL LOW (ref 3.70–5.45)
WBC: 1.5 10*3/uL — ABNORMAL LOW (ref 3.9–10.3)
lymph#: 0.6 10*3/uL — ABNORMAL LOW (ref 0.9–3.3)
nRBC: 0 % (ref 0–0)

## 2012-03-29 LAB — COMPREHENSIVE METABOLIC PANEL
ALT: 13 U/L (ref 0–35)
AST: 13 U/L (ref 0–37)
Albumin: 3.6 g/dL (ref 3.5–5.2)
CO2: 27 mEq/L (ref 19–32)
Calcium: 9.9 mg/dL (ref 8.4–10.5)
Chloride: 103 mEq/L (ref 96–112)
Potassium: 4.4 mEq/L (ref 3.5–5.3)
Total Protein: 6.2 g/dL (ref 6.0–8.3)

## 2012-03-29 MED ORDER — CIPROFLOXACIN HCL 500 MG PO TABS: 500.0000 mg | ORAL_TABLET | Freq: Two times a day (BID) | ORAL | Status: AC

## 2012-03-29 MED ORDER — LORAZEPAM 2 MG/ML IJ SOLN
0.5000 mg | Freq: Once | INTRAMUSCULAR | Status: AC
  Administered 2012-03-29: 0.5 mg via INTRAVENOUS

## 2012-03-29 MED ORDER — SODIUM CHLORIDE 0.9 % IV SOLN
Freq: Once | INTRAVENOUS | Status: AC
  Administered 2012-03-29: 11:00:00 via INTRAVENOUS

## 2012-03-29 MED ORDER — PROCHLORPERAZINE MALEATE 10 MG PO TABS
10.0000 mg | ORAL_TABLET | Freq: Once | ORAL | Status: AC
  Administered 2012-03-29: 10 mg via ORAL

## 2012-03-29 NOTE — Telephone Encounter (Signed)
S/w trhe pt and she is aware that the nutritionist will see her while she is in the tx room. Pt is aware she will be contactd with the social worker to set up a consult with her

## 2012-03-29 NOTE — Progress Notes (Signed)
OFFICE PROGRESS NOTE  CC  [name], DO, DO [phone] W. [facility] [address] [place] [address] Dr. Emelia Loron Dr. 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 Patient is now status post left breast lumpectomy with left axillary lymph node dissection performed on 02/10/2012  PRIOR THERAPY:  #1 patient underwent a left breast lumpectomy that revealed 2 foci of invasive ductal carcinoma measuring 1.3 cm and 0.8 cm grade 2 with adjacent high-grade ductal carcinoma in situ with no evidence of angiolymphatic invasion. The tumor was estrogen receptor +97% progesterone receptor +100% HER-2/neu negative proliferation marker 48% second focus was ER +96% PR +99% HER-2/neu negative with a proliferation marker 74%. 1/22 lymph nodes were examined all of which were positive for metastatic disease  .#2 She will start adjuvant chemotherapy with FEC 100 every 2 weeks with day 2 Neulasta. A total of 4 cycles of this as planned. Thereafter she will receive weekly Taxol for a total of 12 weeks. We will begin 03/23/2012   CURRENT THERAPY: s/p adjuvant chemotherapy with FEC 100 q 2 weeks with day 2 neulasta. Cycle 1 day 1  INTERVAL HISTORY: Alisha Chen 46 y.o. female returns for Followup visit. Multiple complaints including fatigue nausea vomiting. She developed headaches from the Zofran. She has been taking Ativan which does help with the nausea. Unfortunately she did not take the dexamethasone as prescribed. She has slept well but does certainly feel very weak tired and fatigued. She remains very emotional and tearful crying easily. She is still having a difficult time accepting her diagnosis. Not had any fevers or chills. Her oral intake is not as well she's been trying to drink but not doing as  much as she possibly could and she recognizes this. She did come alone today. Remainder of the 10 point review of systems is negative.  MEDICAL HISTORY: Past Medical History  Diagnosis Date  . Back pain   . Hx: UTI (urinary tract infection)   . Cancer     LT BREAST    ALLERGIES:  is allergic to shellfish-derived products; clarithromycin; and penicillins.  MEDICATIONS:  Current Outpatient Prescriptions  Medication Sig Dispense Refill  . dexamethasone (DECADRON) 4 MG tablet Take 2 tablets by mouth once a day on the day after chemotherapy and then take 2 tablets two times a day for 2 days. Take with food.  30 tablet  1  . ferrous sulfate 325 (65 FE) MG EC tablet Take 325 mg by mouth 3 (three) times daily with meals.      . lidocaine-prilocaine (EMLA) cream Apply topically as needed. Apply to port area prior to chemotherapy  30 g  3  . LORazepam (ATIVAN) 0.5 MG tablet Take 1 tablet (0.5 mg total) by mouth every 6 (six) hours as needed (Nausea or vomiting).  30 tablet  0  . prochlorperazine (COMPAZINE) 10 MG tablet Take 1 tablet (10 mg total) by mouth every 6 (six) hours as needed (Nausea or vomiting).  30 tablet  1  . prochlorperazine (COMPAZINE) 25 MG suppository Place 1 suppository (25 mg total) rectally every 12 (twelve) hours as needed for nausea.  12 suppository  3  . ondansetron (ZOFRAN) 8 MG tablet Take 1 tablet two times a day as needed for nausea or vomiting starting on the third day  after chemotherapy.  30 tablet  1    SURGICAL HISTORY:  Past Surgical History  Procedure Date  . Spine surgery 01/2011  . Foot surgery     multiple  . Back surgery     LUMBAR DECOMPRESSION  . Portacath placement 02/10/2012    Procedure: INSERTION PORT-A-CATH;  Surgeon: Emelia Loron, MD;  Location: [facility];  Service: General;  Laterality: Right;  . Breast surgery 2013    left breast lumpectomy, left alnd    REVIEW OF SYSTEMS:  Pertinent items are noted in HPI.    PHYSICAL EXAMINATION: General appearance: alert, cooperative and appears stated age Lymph nodes: Cervical, supraclavicular, and axillary nodes normal. Resp: clear to auscultation bilaterally Back: symmetric, no curvature. ROM normal. No CVA tenderness. Cardio: regular rate and rhythm, S1, S2 normal, no murmur, click, rub or gallop GI: soft, non-tender; bowel sounds normal; no masses,  no organomegaly Extremities: extremities normal, atraumatic, no cyanosis or edema Neurologic: Grossly normal  ECOG PERFORMANCE STATUS: 1 - Symptomatic but completely ambulatory  Blood pressure 119/72, pulse 73, temperature 98.4 F (36.9 C), temperature source Oral, height 5\' 7"  (1.702 m), weight 176 lb 11.2 oz (80.151 kg), last menstrual period 02/27/2012.  LABORATORY DATA: Lab Results  Component Value Date   WBC 1.5* 03/29/2012   HGB 9.5* 03/29/2012   HCT 29.2* 03/29/2012   MCV 85.9 03/29/2012   PLT 126* 03/29/2012      Chemistry      Component Value Date/Time   NA 137 03/23/2012 1221   K 3.9 03/23/2012 1221   CL 106 03/23/2012 1221   CO2 23 03/23/2012 1221   BUN 11 03/23/2012 1221   CREATININE 0.78 03/23/2012 1221      Component Value Date/Time   CALCIUM 9.8 03/23/2012 1221   ALKPHOS 62 03/23/2012 1221   AST 19 03/23/2012 1221   ALT 16 03/23/2012 1221   BILITOT 0.4 03/23/2012 1221       RADIOGRAPHIC STUDIES:  ASSESSMENT: 46 year old female with:  1.  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 The primary tumor revealed 2 foci measuring 1.3 and 0.8 cm.with 1/22 LN positive for metastatic disease   #2 patient is status post cycle 1 of FEC 100.  #3 patient is neutropenic.  #3 nausea vomiting dehydration.  PLAN:   #1 patient will begin neutropenic precautions and I will empirically start her on Cipro 500 mg twice a day.  #2 patient will receive IV fluids today as well as anti-emetics.  #3 patient also does have constipation and we  discussed her taking stool softener 2 pills a day as well as MiraLAX. And to increase her fluid intake.  All questions were answered. The patient knows to call the clinic with any problems, questions or concerns. We can certainly see the patient much sooner if necessary.  I spent > counseling the patient face to face. The total time spent in the appointment was 30 minutes.    Drue Second, MD Medical/Oncology [facility] [phone] (beeper) [phone] (Office)  03/29/2012, 10:43 AM

## 2012-03-29 NOTE — Progress Notes (Signed)
1358 patient completed treatment as ordered. VSS. Monitored patient for 30 minute observation post treatment d/t ativan administration. Patient with no complaints. No dizziness or lightheadedness.

## 2012-03-29 NOTE — Patient Instructions (Signed)
1. You received IVF today with the nausea medications  2. Begin Cipro 500 mg twice a day for 7 days then stop.  3. For Nausea at home you can take the Ativan (lorazepam) and Compazine (prochlorperazine) as instructed on the bottles.  4. For Constipation: take your stool softner 2 at night time, you can also take Miralax daily.  5. I will see you back in 1 week.  6. Please call if you develop fevers or chills, practice good handwashing and staying away from people who are sick since your white count is now low.

## 2012-03-30 ENCOUNTER — Other Ambulatory Visit: Payer: BC Managed Care – PPO | Admitting: Lab

## 2012-03-30 ENCOUNTER — Ambulatory Visit: Payer: BC Managed Care – PPO

## 2012-04-02 ENCOUNTER — Encounter: Payer: Self-pay | Admitting: *Deleted

## 2012-04-02 NOTE — Progress Notes (Signed)
[facility] Brief Psychosocial Assessment Clinical Social Work  Clinical Social Work was referred by Systems developer for assessment of psychosocial needs.  Clinical Social Worker contacted patient at home to offer support and assess for needs.  Pt had questions regarding FMLA and completing paperwork.  CSW informed pt that she could bring her paperwork to [facility], and it would be reviewed by Axel Filler.  Pt also had questions regarding her insurance and financial obligations.  CSW encouraged the pt to meet with the financial counselors.  CSW made a referral to Carolynne Edouard with pt's request to meet with a Artist.  CSW provided pt with contact information and encouraged her to call with any questions or concerns.       Tamala Julian, MSW, LCSW Clinical Social Worker [facility] [phone]

## 2012-04-03 ENCOUNTER — Encounter: Payer: Self-pay | Admitting: Oncology

## 2012-04-03 NOTE — Progress Notes (Signed)
Put fmla papers on nurse's desk °

## 2012-04-04 ENCOUNTER — Encounter: Payer: BC Managed Care – PPO | Admitting: Nutrition

## 2012-04-06 ENCOUNTER — Ambulatory Visit (HOSPITAL_BASED_OUTPATIENT_CLINIC_OR_DEPARTMENT_OTHER): Payer: BC Managed Care – PPO | Admitting: Family

## 2012-04-06 ENCOUNTER — Other Ambulatory Visit (HOSPITAL_BASED_OUTPATIENT_CLINIC_OR_DEPARTMENT_OTHER): Payer: BC Managed Care – PPO | Admitting: Lab

## 2012-04-06 ENCOUNTER — Other Ambulatory Visit: Payer: BC Managed Care – PPO | Admitting: Lab

## 2012-04-06 ENCOUNTER — Ambulatory Visit (HOSPITAL_BASED_OUTPATIENT_CLINIC_OR_DEPARTMENT_OTHER): Payer: BC Managed Care – PPO

## 2012-04-06 ENCOUNTER — Ambulatory Visit: Payer: BC Managed Care – PPO | Admitting: Nutrition

## 2012-04-06 ENCOUNTER — Telehealth: Payer: Self-pay | Admitting: Oncology

## 2012-04-06 ENCOUNTER — Encounter: Payer: Self-pay | Admitting: Oncology

## 2012-04-06 ENCOUNTER — Encounter: Payer: Self-pay | Admitting: Family

## 2012-04-06 VITALS — BP 108/70 | HR 69 | Temp 98.0°F

## 2012-04-06 VITALS — BP 118/78 | HR 86 | Temp 98.3°F | Ht 67.0 in | Wt 171.0 lb

## 2012-04-06 DIAGNOSIS — C50419 Malignant neoplasm of upper-outer quadrant of unspecified female breast: Secondary | ICD-10-CM

## 2012-04-06 DIAGNOSIS — C773 Secondary and unspecified malignant neoplasm of axilla and upper limb lymph nodes: Secondary | ICD-10-CM

## 2012-04-06 DIAGNOSIS — Z5111 Encounter for antineoplastic chemotherapy: Secondary | ICD-10-CM

## 2012-04-06 DIAGNOSIS — Z170 Estrogen receptor positive status [ER+]: Secondary | ICD-10-CM

## 2012-04-06 DIAGNOSIS — C50919 Malignant neoplasm of unspecified site of unspecified female breast: Secondary | ICD-10-CM

## 2012-04-06 LAB — COMPREHENSIVE METABOLIC PANEL
ALT: 13 U/L (ref 0–35)
AST: 14 U/L (ref 0–37)
Albumin: 3.7 g/dL (ref 3.5–5.2)
Alkaline Phosphatase: 74 U/L (ref 39–117)
BUN: 14 mg/dL (ref 6–23)
Potassium: 4.2 mEq/L (ref 3.5–5.3)
Sodium: 139 mEq/L (ref 135–145)

## 2012-04-06 LAB — CBC WITH DIFFERENTIAL/PLATELET
BASO%: 0.4 % (ref 0.0–2.0)
EOS%: 0.5 % (ref 0.0–7.0)
MCH: 28.2 pg (ref 25.1–34.0)
MCHC: 32.6 g/dL (ref 31.5–36.0)
NEUT%: 79.5 % — ABNORMAL HIGH (ref 38.4–76.8)
RBC: 3.69 10*6/uL — ABNORMAL LOW (ref 3.70–5.45)
RDW: 15.2 % — ABNORMAL HIGH (ref 11.2–14.5)
WBC: 9.6 10*3/uL (ref 3.9–10.3)
lymph#: 1.4 10*3/uL (ref 0.9–3.3)
nRBC: 0 % (ref 0–0)

## 2012-04-06 MED ORDER — DEXAMETHASONE SODIUM PHOSPHATE 4 MG/ML IJ SOLN
12.0000 mg | Freq: Once | INTRAMUSCULAR | Status: AC
  Administered 2012-04-06: 12 mg via INTRAVENOUS

## 2012-04-06 MED ORDER — SODIUM CHLORIDE 0.9 % IJ SOLN
10.0000 mL | INTRAMUSCULAR | Status: DC | PRN
  Administered 2012-04-06: 10 mL
  Filled 2012-04-06: qty 10

## 2012-04-06 MED ORDER — OLANZAPINE 10 MG PO TABS: 10.0000 mg | ORAL_TABLET | Freq: Every day | ORAL | Status: DC

## 2012-04-06 MED ORDER — SODIUM CHLORIDE 0.9 % IV SOLN
150.0000 mg | Freq: Once | INTRAVENOUS | Status: AC
  Administered 2012-04-06: 150 mg via INTRAVENOUS
  Filled 2012-04-06: qty 5

## 2012-04-06 MED ORDER — SODIUM CHLORIDE 0.9 % IV SOLN
Freq: Once | INTRAVENOUS | Status: AC
  Administered 2012-04-06: 11:00:00 via INTRAVENOUS

## 2012-04-06 MED ORDER — EPIRUBICIN HCL CHEMO IV INJECTION 200 MG/100ML
100.0000 mg/m2 | Freq: Once | INTRAVENOUS | Status: AC
  Administered 2012-04-06: 192 mg via INTRAVENOUS
  Filled 2012-04-06: qty 96

## 2012-04-06 MED ORDER — HEPARIN SOD (PORK) LOCK FLUSH 100 UNIT/ML IV SOLN
500.0000 [IU] | Freq: Once | INTRAVENOUS | Status: AC | PRN
  Administered 2012-04-06: 500 [IU]
  Filled 2012-04-06: qty 5

## 2012-04-06 MED ORDER — FLUOROURACIL CHEMO INJECTION 2.5 GM/50ML
500.0000 mg/m2 | Freq: Once | INTRAVENOUS | Status: AC
  Administered 2012-04-06: 950 mg via INTRAVENOUS
  Filled 2012-04-06: qty 19

## 2012-04-06 MED ORDER — SODIUM CHLORIDE 0.9 % IV SOLN
500.0000 mg/m2 | Freq: Once | INTRAVENOUS | Status: AC
  Administered 2012-04-06: 960 mg via INTRAVENOUS
  Filled 2012-04-06: qty 48

## 2012-04-06 MED ORDER — PALONOSETRON HCL INJECTION 0.25 MG/5ML
0.2500 mg | Freq: Once | INTRAVENOUS | Status: AC
  Administered 2012-04-06: 0.25 mg via INTRAVENOUS

## 2012-04-06 NOTE — Telephone Encounter (Signed)
gve the pt her may-June 2013 appt calendar

## 2012-04-06 NOTE — Patient Instructions (Signed)
[facility] Discharge Instructions for Patients Receiving Chemotherapy  Today you received the following chemotherapy agents; Ellence, Adrucil and Cytoxan.  To help prevent nausea and vomiting after your treatment, we encourage you to take your nausea medication as instructed by Dr. Welton Flakes.  Begin taking it as often as prescribed.    If you develop nausea and vomiting that is not controlled by your nausea medication, call the clinic [phone]. If it is after clinic hours your family physician or the after hours number for the clinic or go to the Emergency Department.   BELOW ARE SYMPTOMS THAT SHOULD BE REPORTED IMMEDIATELY:  *FEVER GREATER THAN 100.5 F  *CHILLS WITH OR WITHOUT FEVER  NAUSEA AND VOMITING THAT IS NOT CONTROLLED WITH YOUR NAUSEA MEDICATION  *UNUSUAL SHORTNESS OF BREATH  *UNUSUAL BRUISING OR BLEEDING  TENDERNESS IN MOUTH AND THROAT WITH OR WITHOUT PRESENCE OF ULCERS  *URINARY PROBLEMS  *BOWEL PROBLEMS  UNUSUAL RASH Items with * indicate a potential emergency and should be followed up as soon as possible.  One of the nurses will contact you 24 hours after your treatment. Please let the nurse know about any problems that you may have experienced. Feel free to call the clinic you have any questions or concerns. The clinic phone number is [phone].   I have been informed and understand all the instructions given to me. I know to contact the clinic, my physician, or go to the Emergency Department if any problems should occur. I do not have any questions at this time, but understand that I may call the clinic during office hours   should I have any questions or need assistance in obtaining follow up care.    __________________________________________  _____________  __________ Signature of Patient or Authorized Representative            Date                   Time    __________________________________________ Nurse's Signature

## 2012-04-06 NOTE — Assessment & Plan Note (Signed)
[name] is a 46 year old female patient of Dr. Milta Deiters diagnosed with ER-positive breast cancer, receiving chemotherapy.  HISTORY:  Includes a UTI and back pain.  MEDICATIONS INCLUDE:  Decadron, ferrous sulfate, Ativan, Zofran, and Compazine.  LABORATORIES: Labs were reviewed.  HEIGHT:  67 inches. WEIGHT:  171 pounds. USUAL BODY WEIGHT:  170 pounds. BMI:  26.78.  The patient complains of fatigue and nausea.  She states her weight has fluctuated generally between 168 and 176 pounds.  She would like to remain around the 168-pound range, as she feels comfortable with this weight.  She is looking for some suggestions for dealing with nausea.   NUTRITION DIAGNOSIS:  Food and nutrition-related knowledge deficit related to diagnosis of ER-positive breast cancer and associated treatments, as evidenced by no prior need for nutrition interventions.  INTERVENTION:  I educated the patient on the importance of avoiding spicy or greasy foods during times of nausea.  She was encouraged to consume small amounts of food throughout the day, focusing on protein sources.  I have provided her with some fact sheets to take with her today and my contact information and I have answered her questions.   MONITORING/EVALUATION (GOALS):  The patient will tolerate small amounts of food often throughout the day to have an improvement in her nausea and promote weight stabilization.  NEXT VISIT:  Friday, May 31st during chemotherapy.    ______________________________ Alisha Chen, RD, LDN Clinical Nutrition Specialist BN/MEDQ  D:  04/06/2012  T:  04/06/2012  Job:  [id number]

## 2012-04-06 NOTE — Progress Notes (Signed)
Patient came by after her chemo treatment today to talk with me about financial assistance.I did give her a medicaid application and also an epp application to fill out and return to me.

## 2012-04-06 NOTE — Progress Notes (Signed)
OFFICE PROGRESS NOTE  CC  Alisha Freud, DO, DO Dr. Emelia Loron Dr. 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  PRIOR THERAPY: 1. Left breast lumpectomy that revealed 2 foci of invasive ductal carcinoma measuring 1.3 cm and 0.8 cm, grade 2 with adjacent high-grade ductal carcinoma in situ with no evidence of angiolymphatic invasion. ER+ 97% PR +100% HER-2/neu negative, proliferation marker 48%. Second focus was ER +96% PR +99% HER-2/neu negative with a proliferation marker 74%. Total of 1/22 lymph nodes (retrieved in 2 separate dissections) positive for metastatic disease.  2.  She will start adjuvant chemotherapy with FEC 100 every 2 weeks with day 2 Neulasta. A total of 4 cycles of this as planned. Thereafter she will receive weekly Taxol for a total of 12 weeks. We will begin 03/23/2012   CURRENT THERAPY: Adjuvant chemotherapy with FEC 100 q 2 weeks with day 2 neulasta began 03/23/12. Total of 4 cycles planned followed by weekly Taxol for 12 weeks.  INTERVAL HISTORY: Multiple complaints including fatigue and nausea with retching. No vomiting. Had headaches attributable to Zofran. Takes Ativan which does help with the nausea, but makes her somnolent. 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 Required fluids after round one for support. Reports she feels "lightheaded" occasionally, admits to inadequate fluid intake.   Appears anxious, questioning the rationale for dosing, schedules, medications, etc. Accompanied by her husband who is supportive. She isl having a difficult time accepting her diagnosis.   Was neutropenic last week, 7 days following chemo. Was started on Cipro prophylactically by Dr. Welton Flakes. No infections, no fevers or chills. Had bone pain several days after Neulasta injection. Started Claritin with onset of pain. Remainder of the 10 point review of  systems is negative.  ALLERGIES:  is allergic to shellfish-derived products; clarithromycin; and penicillins.  REVIEW OF SYSTEMS:  Pertinent items are noted in HPI.   PHYSICAL EXAMINATION:  General: Well developed, well nourished, in no acute distress.  EENT: No ocular or oral lesions. No stomatitis.  Respiratory: Lungs are clear to auscultation bilaterally with normal respiratory movement and no accessory muscle use. Cardiac: No murmur, rub or tachycardia. No upper or lower extremity edema.  GI: Abdomen is soft, no palpable hepatosplenomegaly. No fluid wave. No tenderness. Musculoskeletal: No kyphosis, no tenderness over the spine, ribs or hips. Lymph: No cervical, infraclavicular, axillary or inguinal adenopathy. Neuro: No focal neurological deficits. Psych: Alert and oriented X 3, appropriate mood and affect.   ECOG PERFORMANCE STATUS: 1 - Symptomatic but completely ambulatory  Blood pressure 118/78, pulse 86, temperature 98.3 F (36.8 C), temperature source Oral, height 5\' 7"  (1.702 m), weight 171 lb (77.565 kg).  LABORATORY DATA: Lab Results  Component Value Date   WBC 9.6 04/06/2012   HGB 10.4* 04/06/2012   HCT 31.9* 04/06/2012   MCV 86.4 04/06/2012   PLT 128* 04/06/2012    ASSESSMENT: 46 year old female with:  1. Left breast cancer, receiving adjuvant chemotherapy with fair tolerance.  2. Nausea persistent throughout last cycle, in spite of medications.  3. Neutropenia 1 week following last cycle.  4. Significant bone pain 3 days following Neulasta injection.   PLAN:  1. Treatment today.  2. Schedule infusion appt for next week (5/24)  in conjunction with appt with Dr. Welton Flakes and lab, anticipating she may require fluid support.  3. Add IV Compazine to today's premeds (at the suggestion of Pharmacy).  4. Add Zyprexa per NCCN guidelines for intractable nausea following chemo. She will take days 2-4 of cycle. A prescription is e-scribed.  5. Nutrition consult today in  infusion.  6. Return tomorrow for Neulasta. 7. Begin Claritin evening of chemo in anticipation of bone pain with Neulasta injection on Day 2 of each cycle.   All questions were answered. The patient knows to call the clinic with any problems, questions or concerns.

## 2012-04-07 ENCOUNTER — Ambulatory Visit (HOSPITAL_BASED_OUTPATIENT_CLINIC_OR_DEPARTMENT_OTHER): Payer: BC Managed Care – PPO

## 2012-04-07 VITALS — BP 128/79 | HR 91 | Temp 97.0°F

## 2012-04-07 DIAGNOSIS — C773 Secondary and unspecified malignant neoplasm of axilla and upper limb lymph nodes: Secondary | ICD-10-CM

## 2012-04-07 DIAGNOSIS — Z5189 Encounter for other specified aftercare: Secondary | ICD-10-CM

## 2012-04-07 DIAGNOSIS — C50419 Malignant neoplasm of upper-outer quadrant of unspecified female breast: Secondary | ICD-10-CM

## 2012-04-07 MED ORDER — PEGFILGRASTIM INJECTION 6 MG/0.6ML
6.0000 mg | Freq: Once | SUBCUTANEOUS | Status: AC
  Administered 2012-04-07: 6 mg via SUBCUTANEOUS

## 2012-04-12 ENCOUNTER — Other Ambulatory Visit: Payer: Self-pay | Admitting: Oncology

## 2012-04-13 ENCOUNTER — Telehealth: Payer: Self-pay | Admitting: *Deleted

## 2012-04-13 ENCOUNTER — Ambulatory Visit: Payer: BC Managed Care – PPO

## 2012-04-13 ENCOUNTER — Ambulatory Visit (HOSPITAL_BASED_OUTPATIENT_CLINIC_OR_DEPARTMENT_OTHER): Payer: BC Managed Care – PPO

## 2012-04-13 ENCOUNTER — Encounter: Payer: Self-pay | Admitting: Oncology

## 2012-04-13 ENCOUNTER — Other Ambulatory Visit (HOSPITAL_BASED_OUTPATIENT_CLINIC_OR_DEPARTMENT_OTHER): Payer: BC Managed Care – PPO | Admitting: Lab

## 2012-04-13 ENCOUNTER — Ambulatory Visit: Payer: BC Managed Care – PPO | Admitting: Oncology

## 2012-04-13 ENCOUNTER — Other Ambulatory Visit: Payer: BC Managed Care – PPO | Admitting: Lab

## 2012-04-13 VITALS — BP 109/74 | HR 83 | Temp 97.9°F | Ht 67.0 in | Wt 172.2 lb

## 2012-04-13 DIAGNOSIS — E860 Dehydration: Secondary | ICD-10-CM

## 2012-04-13 DIAGNOSIS — R110 Nausea: Secondary | ICD-10-CM

## 2012-04-13 DIAGNOSIS — C50419 Malignant neoplasm of upper-outer quadrant of unspecified female breast: Secondary | ICD-10-CM

## 2012-04-13 LAB — CBC WITH DIFFERENTIAL/PLATELET
Basophils Absolute: 0.1 10*3/uL (ref 0.0–0.1)
Eosinophils Absolute: 0 10*3/uL (ref 0.0–0.5)
HGB: 9.4 g/dL — ABNORMAL LOW (ref 11.6–15.9)
MCV: 85.3 fL (ref 79.5–101.0)
MONO#: 0.1 10*3/uL (ref 0.1–0.9)
MONO%: 2.9 % (ref 0.0–14.0)
NEUT#: 2.3 10*3/uL (ref 1.5–6.5)
RBC: 3.34 10*6/uL — ABNORMAL LOW (ref 3.70–5.45)
RDW: 15 % — ABNORMAL HIGH (ref 11.2–14.5)
WBC: 3.1 10*3/uL — ABNORMAL LOW (ref 3.9–10.3)
nRBC: 0 % (ref 0–0)

## 2012-04-13 LAB — COMPREHENSIVE METABOLIC PANEL
BUN: 14 mg/dL (ref 6–23)
CO2: 28 mEq/L (ref 19–32)
Chloride: 105 mEq/L (ref 96–112)
Glucose, Bld: 68 mg/dL — ABNORMAL LOW (ref 70–99)
Potassium: 4.1 mEq/L (ref 3.5–5.3)

## 2012-04-13 MED ORDER — LORAZEPAM 2 MG/ML IJ SOLN
0.5000 mg | Freq: Once | INTRAMUSCULAR | Status: DC
  Administered 2012-04-13: 0.5 mg via INTRAVENOUS

## 2012-04-13 MED ORDER — SODIUM CHLORIDE 0.9 % IV SOLN
1000.0000 mL | INTRAVENOUS | Status: DC
  Administered 2012-04-13: 1000 mL via INTRAVENOUS

## 2012-04-13 NOTE — Telephone Encounter (Signed)
sent email to [facility] patient needs ivf on 04-27-2012 printed out calendar and gave to the patient in the treatment room

## 2012-04-13 NOTE — Progress Notes (Signed)
OFFICE PROGRESS NOTE  CC  Alisha Freud, DO, DO [phone] [address] [facility] [address] [place] [address] Dr. Emelia Loron Dr. 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 Patient is now status post left breast lumpectomy with left axillary lymph node dissection performed on 02/10/2012  PRIOR THERAPY:  #1 patient underwent a left breast lumpectomy that revealed 2 foci of invasive ductal carcinoma measuring 1.3 cm and 0.8 cm grade 2 with adjacent high-grade ductal carcinoma in situ with no evidence of angiolymphatic invasion. The tumor was estrogen receptor +97% progesterone receptor +100% HER-2/neu negative proliferation marker 48% second focus was ER +96% PR +99% HER-2/neu negative with a proliferation marker 74%. 1/22 lymph nodes were examined all of which were positive for metastatic disease  .#2 She will start adjuvant chemotherapy with FEC 100 every 2 weeks with day 2 Neulasta. A total of 4 cycles of this as planned. Thereafter she will receive weekly Taxol for a total of 12 weeks. We will begin 03/23/2012   CURRENT THERAPY: s/p adjuvant chemotherapy with FEC 100 q 2 weeks with day 2 neulasta.  Cycle 2   INTERVAL HISTORY: Alisha Chen 45 y.o. female returns for Followup visit. Overall she's doing well she tolerated cycle 2 but she is complaining of being a little woozy and dizzy. She's try to drink as much as she possibly can. She is also having some difficulty with nausea. She does develop headaches with taking Zofran so she is on Zyprexa.She has not had any bleeding problems no peripheral paresthesias no abdominal pain. She is suffering from constipation she is trying to take stool softeners. I have received instructed her to take these again. She is also developed because of the  constipation and anal fissure. Symptomatic management is recommended. Patient currently is not neutropenic. Her main her of the 10 point review of systems is negative.  MEDICAL HISTORY: Past Medical History  Diagnosis Date  . Back pain   . Hx: UTI (urinary tract infection)   . Cancer     LT BREAST    ALLERGIES:  is allergic to shellfish-derived products; clarithromycin; and penicillins.  MEDICATIONS:  Current Outpatient Prescriptions  Medication Sig Dispense Refill  . dexamethasone (DECADRON) 4 MG tablet Take 2 tablets by mouth once a day on the day after chemotherapy and then take 2 tablets two times a day for 2 days. Take with food.  30 tablet  1  . ferrous sulfate 325 (65 FE) MG EC tablet Take 325 mg by mouth 3 (three) times daily with meals.      . lidocaine-prilocaine (EMLA) cream Apply topically as needed. Apply to port area prior to chemotherapy  30 g  3  . LORazepam (ATIVAN) 0.5 MG tablet Take 1 tablet (0.5 mg total) by mouth every 6 (six) hours as needed (Nausea or vomiting).  30 tablet  0  . OLANZapine (ZYPREXA) 10 MG tablet Take 1 tablet (10 mg total) by mouth at bedtime. Begin day after chemo and take for 3 days. (Day 2-4 of chemo cycle).  9 tablet  0  . prochlorperazine (COMPAZINE) 10 MG tablet Take 1 tablet (10 mg total) by mouth every 6 (six) hours as needed (Nausea or vomiting).  30 tablet  1  . prochlorperazine (COMPAZINE) 25 MG suppository Place 1 suppository (25  mg total) rectally every 12 (twelve) hours as needed for nausea.  12 suppository  3   Current Facility-Administered Medications  Medication Dose Route Frequency Provider Last Rate Last Dose  . 0.9 %  sodium chloride infusion  1,000 mL Intravenous Continuous Victorino December, MD      . LORazepam (ATIVAN) injection 0.5 mg  0.5 mg Intravenous Once Victorino December, MD        SURGICAL HISTORY:  Past Surgical History  Procedure Date  . Spine surgery 01/2011  . Foot surgery     multiple  . Back surgery      LUMBAR DECOMPRESSION  . Portacath placement 02/10/2012    Procedure: INSERTION PORT-A-CATH;  Surgeon: Emelia Loron, MD;  Location: [facility];  Service: General;  Laterality: Right;  . Breast surgery 2013    left breast lumpectomy, left alnd    REVIEW OF SYSTEMS:  Pertinent items are noted in HPI.   PHYSICAL EXAMINATION: General appearance: alert, cooperative and appears stated age Lymph nodes: Cervical, supraclavicular, and axillary nodes normal. Resp: clear to auscultation bilaterally Back: symmetric, no curvature. ROM normal. No CVA tenderness. Cardio: regular rate and rhythm, S1, S2 normal, no murmur, click, rub or gallop GI: soft, non-tender; bowel sounds normal; no masses,  no organomegaly Extremities: extremities normal, atraumatic, no cyanosis or edema Neurologic: Grossly normal  ECOG PERFORMANCE STATUS: 1 - Symptomatic but completely ambulatory  Blood pressure 109/74, pulse 83, temperature 97.9 F (36.6 C), height 5\' 7"  (1.702 m), weight 172 lb 3.2 oz (78.109 kg).  LABORATORY DATA: Lab Results  Component Value Date   WBC 3.1* 04/13/2012   HGB 9.4* 04/13/2012   HCT 28.5* 04/13/2012   MCV 85.3 04/13/2012   PLT 148 04/13/2012      Chemistry      Component Value Date/Time   NA 139 04/06/2012 0853   K 4.2 04/06/2012 0853   CL 107 04/06/2012 0853   CO2 27 04/06/2012 0853   BUN 14 04/06/2012 0853   CREATININE 1.04 04/06/2012 0853      Component Value Date/Time   CALCIUM 9.6 04/06/2012 0853   ALKPHOS 74 04/06/2012 0853   AST 14 04/06/2012 0853   ALT 13 04/06/2012 0853   BILITOT 0.2* 04/06/2012 0853       RADIOGRAPHIC STUDIES:  ASSESSMENT: 46 year old female with:  1.  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 The primary tumor revealed 2 foci measuring 1.3 and 0.8 cm.with 1/22 LN positive for metastatic disease   #2 patient is status post cycle 2 of FEC 100.  #3 nausea vomiting  dehydration.  #4 rectal bleeding and anal fissure  PLAN:   #1 overall patient tolerated the chemotherapy well she is not neutropenic or thrombocytopenic with this cycle that his cycle 2.  #2 however patient continues to experience nausea and dehydration. She will be given IV fluids and Ativan today.  #3 she does develop significant constipation. We discussed doing stool softener one in the morning and 2 at night as well as adding MiraLAX.  #4 patient did develop what sounds like an anal fissure and I have recommended symptomatic management including doing sitz baths as well as Tucks medicated pads to see if she can have some pain relief.  All questions were answered. The patient knows to call the clinic with any problems, questions or concerns. We can certainly see the patient much sooner if necessary.  I spent > counseling  the patient face to face. The total time spent in the appointment was 30 minutes.    Drue Second, MD Medical/Oncology [facility] [phone] (beeper) [phone] (Office)  04/13/2012, 12:10 PM

## 2012-04-13 NOTE — Patient Instructions (Signed)
1. Proceed with IVF today and Ativan  2. Next chemotherapy on 04/20/12.  3. Take stool softer 1 pill in the morning and 2 at night. May add miralax if you remain constipated. Do sitz baths and may use Tucks medicated pads for comfort around the rectal area  4. Call with any problems

## 2012-04-13 NOTE — Telephone Encounter (Signed)
Per e-mail from Jacki Cones, I have scheduled IVF to follow MD on 6/7.  JMW

## 2012-04-20 ENCOUNTER — Ambulatory Visit (HOSPITAL_BASED_OUTPATIENT_CLINIC_OR_DEPARTMENT_OTHER): Payer: BC Managed Care – PPO | Admitting: Family

## 2012-04-20 ENCOUNTER — Other Ambulatory Visit (HOSPITAL_BASED_OUTPATIENT_CLINIC_OR_DEPARTMENT_OTHER): Payer: BC Managed Care – PPO | Admitting: Lab

## 2012-04-20 ENCOUNTER — Encounter: Payer: Self-pay | Admitting: Family

## 2012-04-20 ENCOUNTER — Ambulatory Visit: Payer: BC Managed Care – PPO | Admitting: Nutrition

## 2012-04-20 ENCOUNTER — Ambulatory Visit (HOSPITAL_BASED_OUTPATIENT_CLINIC_OR_DEPARTMENT_OTHER): Payer: BC Managed Care – PPO

## 2012-04-20 VITALS — BP 112/74 | HR 82 | Temp 98.6°F | Ht 67.0 in | Wt 170.3 lb

## 2012-04-20 DIAGNOSIS — F411 Generalized anxiety disorder: Secondary | ICD-10-CM

## 2012-04-20 DIAGNOSIS — C773 Secondary and unspecified malignant neoplasm of axilla and upper limb lymph nodes: Secondary | ICD-10-CM

## 2012-04-20 DIAGNOSIS — C50919 Malignant neoplasm of unspecified site of unspecified female breast: Secondary | ICD-10-CM

## 2012-04-20 DIAGNOSIS — Z5111 Encounter for antineoplastic chemotherapy: Secondary | ICD-10-CM

## 2012-04-20 DIAGNOSIS — C50419 Malignant neoplasm of upper-outer quadrant of unspecified female breast: Secondary | ICD-10-CM

## 2012-04-20 DIAGNOSIS — Z170 Estrogen receptor positive status [ER+]: Secondary | ICD-10-CM

## 2012-04-20 LAB — CBC WITH DIFFERENTIAL/PLATELET
BASO%: 0.4 % (ref 0.0–2.0)
EOS%: 0.3 % (ref 0.0–7.0)
HCT: 30 % — ABNORMAL LOW (ref 34.8–46.6)
LYMPH%: 11.8 % — ABNORMAL LOW (ref 14.0–49.7)
MCH: 28.6 pg (ref 25.1–34.0)
MCHC: 32.7 g/dL (ref 31.5–36.0)
MCV: 87.5 fL (ref 79.5–101.0)
MONO#: 0.3 10*3/uL (ref 0.1–0.9)
MONO%: 3.6 % (ref 0.0–14.0)
NEUT%: 83.9 % — ABNORMAL HIGH (ref 38.4–76.8)
Platelets: 154 10*3/uL (ref 145–400)

## 2012-04-20 MED ORDER — FLUOROURACIL CHEMO INJECTION 2.5 GM/50ML
500.0000 mg/m2 | Freq: Once | INTRAVENOUS | Status: AC
  Administered 2012-04-20: 950 mg via INTRAVENOUS
  Filled 2012-04-20: qty 19

## 2012-04-20 MED ORDER — EPIRUBICIN HCL CHEMO IV INJECTION 200 MG/100ML
100.0000 mg/m2 | Freq: Once | INTRAVENOUS | Status: AC
  Administered 2012-04-20: 192 mg via INTRAVENOUS
  Filled 2012-04-20: qty 96

## 2012-04-20 MED ORDER — LORAZEPAM 2 MG/ML IJ SOLN
0.5000 mg | INTRAMUSCULAR | Status: AC
  Administered 2012-04-20: 0.5 mg via INTRAVENOUS

## 2012-04-20 MED ORDER — OLANZAPINE 10 MG PO TABS: 10.0000 mg | ORAL_TABLET | Freq: Every day | ORAL | Status: DC

## 2012-04-20 MED ORDER — PROCHLORPERAZINE MALEATE 10 MG PO TABS
10.0000 mg | ORAL_TABLET | Freq: Once | ORAL | Status: AC
  Administered 2012-04-20: 10 mg via ORAL

## 2012-04-20 MED ORDER — SODIUM CHLORIDE 0.9 % IV SOLN
Freq: Once | INTRAVENOUS | Status: AC
  Administered 2012-04-20: 10:00:00 via INTRAVENOUS

## 2012-04-20 MED ORDER — PALONOSETRON HCL INJECTION 0.25 MG/5ML
0.2500 mg | Freq: Once | INTRAVENOUS | Status: AC
  Administered 2012-04-20: 0.25 mg via INTRAVENOUS

## 2012-04-20 MED ORDER — SODIUM CHLORIDE 0.9 % IJ SOLN
10.0000 mL | INTRAMUSCULAR | Status: DC | PRN
  Administered 2012-04-20: 10 mL
  Filled 2012-04-20: qty 10

## 2012-04-20 MED ORDER — DEXAMETHASONE SODIUM PHOSPHATE 4 MG/ML IJ SOLN
12.0000 mg | Freq: Once | INTRAMUSCULAR | Status: AC
  Administered 2012-04-20: 12 mg via INTRAVENOUS

## 2012-04-20 MED ORDER — SODIUM CHLORIDE 0.9 % IV SOLN
500.0000 mg/m2 | Freq: Once | INTRAVENOUS | Status: AC
  Administered 2012-04-20: 960 mg via INTRAVENOUS
  Filled 2012-04-20: qty 48

## 2012-04-20 MED ORDER — SODIUM CHLORIDE 0.9 % IV SOLN
150.0000 mg | Freq: Once | INTRAVENOUS | Status: AC
  Administered 2012-04-20: 150 mg via INTRAVENOUS
  Filled 2012-04-20: qty 5

## 2012-04-20 MED ORDER — HEPARIN SOD (PORK) LOCK FLUSH 100 UNIT/ML IV SOLN
500.0000 [IU] | Freq: Once | INTRAVENOUS | Status: AC | PRN
  Administered 2012-04-20: 500 [IU]
  Filled 2012-04-20: qty 5

## 2012-04-20 NOTE — Progress Notes (Signed)
OFFICE PROGRESS NOTE  CC  Alisha Freud, DO, DO Dr. Emelia Loron Dr. 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  PRIOR THERAPY: 1. Left breast lumpectomy that revealed 2 foci of invasive ductal carcinoma measuring 1.3 cm and 0.8 cm, grade 2 with adjacent high-grade ductal carcinoma in situ with no evidence of angiolymphatic invasion. ER+ 97% PR +100% HER-2/neu negative, proliferation marker 48%. Second focus was ER +96% PR +99% HER-2/neu negative with a proliferation marker 74%. Total of 1/22 lymph nodes (retrieved in 2 separate dissections) positive for metastatic disease.  2.  She will start adjuvant chemotherapy with FEC 100 every 2 weeks with day 2 Neulasta. A total of 4 cycles of this as planned. Thereafter she will receive weekly Taxol for a total of 12 weeks. We will begin 03/23/2012   CURRENT THERAPY: Adjuvant chemotherapy with FEC 100 q 2 weeks with day 2 neulasta began 03/23/12. Total of 4 cycles planned followed by weekly Taxol for 12 weeks.  INTERVAL HISTORY: Nausea improved with addition of Zyprexa for 3 days following chemo. No vomiting. Had headaches attributable to Zofran. Takes Ativan which does help with the nausea, but makes her somnolent. 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 Reports she feels "lightheaded" occasionally, admits to inadequate fluid intake.   Appears anxious, questioning the rationale for dosing, schedules, medications, etc. Alone at today's visit. She is having a difficult time accepting her diagnosis, tearful at times during the visit.   No cough or shortness of breath. No abdominal pain or new bone pain. Bowel and bladder function are normal. Appetite is good, with adequate fluid intake. Anal fissure is improved with stool softener, preparationH. Pain is improved. Remainder of the 10 point  review of systems is  negative.  ALLERGIES:  is allergic to shellfish-derived products; clarithromycin; and penicillins.  REVIEW OF SYSTEMS:  Pertinent items are noted in HPI.   PHYSICAL EXAMINATION:  General: Well developed, well nourished, in no acute distress.  EENT: No ocular or oral lesions. No stomatitis.  Respiratory: Lungs are clear to auscultation bilaterally with normal respiratory movement and no accessory muscle use. Cardiac: No murmur, rub or tachycardia. No upper or lower extremity edema.  GI: Abdomen is soft, no palpable hepatosplenomegaly. No fluid wave. No tenderness. Musculoskeletal: No kyphosis, no tenderness over the spine, ribs or hips. Lymph: No cervical, infraclavicular, axillary or inguinal adenopathy. Neuro: No focal neurological deficits. Psych: Alert and oriented X 3, appropriate mood and affect.   ECOG PERFORMANCE STATUS: 1 - Symptomatic but completely ambulatory  Blood pressure 112/74, pulse 82, temperature 98.6 F (37 C), height 5\' 7"  (1.702 m), weight 170 lb 4.8 oz (77.248 kg).  LABORATORY DATA: Lab Results  Component Value Date   WBC 9.4 04/20/2012   HGB 9.8* 04/20/2012   HCT 30.0* 04/20/2012   MCV 87.5 04/20/2012   PLT 154 04/20/2012    ASSESSMENT: 46 year old female with:  1. Left breast cancer, receiving adjuvant chemotherapy with fair tolerance.  2. Nausea improved with last cycle.  3. Significant bone pain 3 days following Neulasta injection.  5. Anal fissure, improving.  5. Constipation, improving.   PLAN:  1. Treatment today.  2. Return tomorrow for Neulasta. 3. Begin Claritin evening of chemo in anticipation of bone pain with Neulasta injection on Day 2 of each cycle.  4. IV fluids for support as needed.  5. Add Ativan today's treatment per  her request.   All questions were answered. The patient knows to call the clinic with any problems, questions or concerns.

## 2012-04-20 NOTE — Patient Instructions (Addendum)
[facility] Discharge Instructions for Patients Receiving Chemotherapy  Today you received the following chemotherapy agents: Epirubicin, 5 FU and Cytoxan.   To help prevent nausea and vomiting after your treatment, we encourage you to take your nausea medication :  Ativan (lorazepam) and Compazine (prochlorperazine) If you develop nausea and vomiting that is not controlled by your nausea medication, call the clinic. If it is after clinic hours your family physician or the after hours number for the clinic or go to the Emergency Department.   BELOW ARE SYMPTOMS THAT SHOULD BE REPORTED IMMEDIATELY:  *FEVER GREATER THAN 100.5 F  *CHILLS WITH OR WITHOUT FEVER  NAUSEA AND VOMITING THAT IS NOT CONTROLLED WITH YOUR NAUSEA MEDICATION  *UNUSUAL SHORTNESS OF BREATH  *UNUSUAL BRUISING OR BLEEDING  TENDERNESS IN MOUTH AND THROAT WITH OR WITHOUT PRESENCE OF ULCERS  *URINARY PROBLEMS  *BOWEL PROBLEMS  UNUSUAL RASH Items with * indicate a potential emergency and should be followed up as soon as possible.   Please let the nurse know about any problems that you may have experienced. Feel free to call the clinic you have any questions or concerns. The clinic phone number is [phone].   I have been informed and understand all the instructions given to me. I know to contact the clinic, my physician, or go to the Emergency Department if any problems should occur. I do not have any questions at this time, but understand that I may call the clinic during office hours   should I have any questions or need assistance in obtaining follow up care.    __________________________________________  _____________  __________ Signature of Patient or Authorized Representative            Date                   Time    __________________________________________ Nurse's Signature

## 2012-04-20 NOTE — Progress Notes (Signed)
I spoke with Ms. Durnell today in the chemotherapy area.  She reports that she is doing a bit better.  Her weight is stable at 172.2 pounds. She is taking stool softeners and MiraLAX for constipation.  She denies nausea at this time.  She does state that she wishes she could exercise, that she is starting to feel sluggish from not exercising and she is hoping that she can incorporate some safe light exercise into her routine.  NUTRITION DIAGNOSIS:  Food and nutrition related knowledge has resolved.  I have encouraged the patient to continue to eat and drink to maintain present weight and to exercise with physician approval.  She is encouraged to call me with questions or concerns and has agreed to do so.   ______________________________ Zenovia Jarred, RD, LDN Clinical Nutrition Specialist BN/MEDQ  D:  04/20/2012  T:  04/20/2012  Job:  [id number]

## 2012-04-21 ENCOUNTER — Ambulatory Visit (HOSPITAL_BASED_OUTPATIENT_CLINIC_OR_DEPARTMENT_OTHER): Payer: BC Managed Care – PPO

## 2012-04-21 VITALS — BP 114/75 | HR 84 | Temp 99.1°F

## 2012-04-21 DIAGNOSIS — Z5189 Encounter for other specified aftercare: Secondary | ICD-10-CM

## 2012-04-21 DIAGNOSIS — C50419 Malignant neoplasm of upper-outer quadrant of unspecified female breast: Secondary | ICD-10-CM

## 2012-04-21 DIAGNOSIS — C773 Secondary and unspecified malignant neoplasm of axilla and upper limb lymph nodes: Secondary | ICD-10-CM

## 2012-04-21 LAB — COMPREHENSIVE METABOLIC PANEL
ALT: 16 U/L (ref 0–35)
CO2: 24 mEq/L (ref 19–32)
Creatinine, Ser: 0.77 mg/dL (ref 0.50–1.10)
Total Bilirubin: 0.3 mg/dL (ref 0.3–1.2)

## 2012-04-21 MED ORDER — PEGFILGRASTIM INJECTION 6 MG/0.6ML
6.0000 mg | Freq: Once | SUBCUTANEOUS | Status: AC
  Administered 2012-04-21: 6 mg via SUBCUTANEOUS

## 2012-04-23 ENCOUNTER — Telehealth: Payer: Self-pay | Admitting: Medical Oncology

## 2012-04-23 NOTE — Telephone Encounter (Signed)
Received call from stating that "I just finished another cycle of chemo and I haven't had my period in about a month or two.  I was just wondering if this was normal?"  Returned call to patient and informed patient that one of the side effects of the Ellence (epirubicin) could be a loss of menstrual cycle.  Patient asked if she was still at risk for becoming pregnant, advised patient that during chemotherapy treatments she should be taking precautions during sexual activities.  Patient expressed understanding and had no further questions.

## 2012-04-27 ENCOUNTER — Encounter: Payer: Self-pay | Admitting: Family

## 2012-04-27 ENCOUNTER — Ambulatory Visit: Payer: BC Managed Care – PPO

## 2012-04-27 ENCOUNTER — Ambulatory Visit (HOSPITAL_BASED_OUTPATIENT_CLINIC_OR_DEPARTMENT_OTHER): Payer: BC Managed Care – PPO | Admitting: Family

## 2012-04-27 ENCOUNTER — Other Ambulatory Visit (HOSPITAL_BASED_OUTPATIENT_CLINIC_OR_DEPARTMENT_OTHER): Payer: BC Managed Care – PPO | Admitting: Lab

## 2012-04-27 VITALS — BP 104/69 | HR 91 | Temp 98.9°F | Ht 67.0 in | Wt 172.3 lb

## 2012-04-27 DIAGNOSIS — C50919 Malignant neoplasm of unspecified site of unspecified female breast: Secondary | ICD-10-CM

## 2012-04-27 DIAGNOSIS — M949 Disorder of cartilage, unspecified: Secondary | ICD-10-CM

## 2012-04-27 DIAGNOSIS — M899 Disorder of bone, unspecified: Secondary | ICD-10-CM

## 2012-04-27 DIAGNOSIS — Z170 Estrogen receptor positive status [ER+]: Secondary | ICD-10-CM

## 2012-04-27 DIAGNOSIS — C50419 Malignant neoplasm of upper-outer quadrant of unspecified female breast: Secondary | ICD-10-CM

## 2012-04-27 DIAGNOSIS — C773 Secondary and unspecified malignant neoplasm of axilla and upper limb lymph nodes: Secondary | ICD-10-CM

## 2012-04-27 LAB — CBC WITH DIFFERENTIAL/PLATELET
BASO%: 0.6 % (ref 0.0–2.0)
Eosinophils Absolute: 0.1 10*3/uL (ref 0.0–0.5)
HCT: 27 % — ABNORMAL LOW (ref 34.8–46.6)
LYMPH%: 21.5 % (ref 14.0–49.7)
MCHC: 33.5 g/dL (ref 31.5–36.0)
MCV: 88.4 fL (ref 79.5–101.0)
MONO#: 0.1 10*3/uL (ref 0.1–0.9)
MONO%: 4.1 % (ref 0.0–14.0)
NEUT%: 71.3 % (ref 38.4–76.8)
Platelets: 135 10*3/uL — ABNORMAL LOW (ref 145–400)
RBC: 3.05 10*6/uL — ABNORMAL LOW (ref 3.70–5.45)
WBC: 2.2 10*3/uL — ABNORMAL LOW (ref 3.9–10.3)

## 2012-04-27 LAB — COMPREHENSIVE METABOLIC PANEL
Alkaline Phosphatase: 99 U/L (ref 39–117)
CO2: 28 mEq/L (ref 19–32)
Creatinine, Ser: 0.74 mg/dL (ref 0.50–1.10)
Glucose, Bld: 91 mg/dL (ref 70–99)
Sodium: 135 mEq/L (ref 135–145)
Total Bilirubin: 0.4 mg/dL (ref 0.3–1.2)

## 2012-04-27 NOTE — Progress Notes (Signed)
OFFICE PROGRESS NOTE  CC  [name], DO, DO Dr. Emelia Loron Dr. 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  PRIOR THERAPY: 1. Left breast lumpectomy that revealed 2 foci of invasive ductal carcinoma measuring 1.3 cm and 0.8 cm, grade 2 with adjacent high-grade ductal carcinoma in situ with no evidence of angiolymphatic invasion. ER+ 97% PR +100% HER-2/neu negative, proliferation marker 48%. Second focus was ER +96% PR +99% HER-2/neu negative with a proliferation marker 74%. Total of 1/22 lymph nodes (retrieved in 2 separate dissections) positive for metastatic disease.  2.  She will start adjuvant chemotherapy with FEC 100 every 2 weeks with day 2 Neulasta. A total of 4 cycles of this as planned. Thereafter she will receive weekly Taxol for a total of 12 weeks. We will begin 03/23/2012   CURRENT THERAPY: Adjuvant chemotherapy with FEC 100 q 2 weeks with day 2 neulasta began 03/23/12. Total of 4 cycles planned followed by weekly Taxol for 12 weeks.  INTERVAL HISTORY: No vomiting. Chief complaint is weakness and fatigue. Reports low grade temperature in the evening, normal during daytime hours. No headaches, takes Ativan which does help with the nausea, but makes her somnolent. 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 Reports she feels "lightheaded" occasionally, has improved fluid intake.   Appears anxious, questioning the rationale for weekly Taxol. Says she was hoping to avoid additional chemo after these 4 rounds. Alone at today's visit. She is having a difficult time accepting her diagnosis, tearful at times during the visit. Says her husband is very supportive and helps out at home with her 34 yr old son.   No cough or shortness of breath. No abdominal pain or new bone pain. Bowel and bladder function are normal. Appetite is fair, eating fruit. Anal fissure  is improved with stool softener, preparationH. Pain is improved. Remainder of the 10 point  review of systems is negative.  ALLERGIES:  is allergic to shellfish-derived products; clarithromycin; and penicillins.  REVIEW OF SYSTEMS:  Pertinent items are noted in HPI.   PHYSICAL EXAMINATION:  General: Well developed, well nourished, in no acute distress.  EENT: No ocular or oral lesions. No stomatitis.  Respiratory: Lungs are clear to auscultation bilaterally with normal respiratory movement and no accessory muscle use. Cardiac: No murmur, rub or tachycardia. No upper or lower extremity edema.  GI: Abdomen is soft, no palpable hepatosplenomegaly. No fluid wave. No tenderness. Musculoskeletal: No kyphosis, no tenderness over the spine, ribs or hips. Lymph: No cervical, infraclavicular, axillary or inguinal adenopathy. Neuro: No focal neurological deficits. Psych: Alert and oriented X 3, appropriate mood and affect.   ECOG PERFORMANCE STATUS: 1 - Symptomatic but completely ambulatory  Blood pressure 104/69, pulse 91, temperature 98.9 F (37.2 C), temperature source Oral, height 5\' 7"  (1.702 m), weight 172 lb 4.8 oz (78.155 kg).  LABORATORY DATA: Lab Results  Component Value Date   WBC 2.2* 04/27/2012   HGB 9.0* 04/27/2012   HCT 27.0* 04/27/2012   MCV 88.4 04/27/2012   PLT 135* 04/27/2012    ASSESSMENT: 46 year old female with:  1. Left breast cancer, receiving adjuvant chemotherapy with fair tolerance.  2. Nausea improved with last cycle.  3. Significant bone pain 3 days following Neulasta injection.  5. Anal fissure, improving.  5. Constipation, improving.  6. Hgb trending down. No action indicated on today's lab.   PLAN:  1. No treatment  today.  2. Return next week for final cycle of FEC and appt with Dr. Park Breed.   3. IV fluids for support as needed, none needed today.    All questions were answered. The patient knows to call the clinic with any problems, questions or concerns.

## 2012-05-03 ENCOUNTER — Encounter: Payer: Self-pay | Admitting: Pharmacist

## 2012-05-04 ENCOUNTER — Ambulatory Visit (HOSPITAL_BASED_OUTPATIENT_CLINIC_OR_DEPARTMENT_OTHER): Payer: BC Managed Care – PPO | Admitting: Oncology

## 2012-05-04 ENCOUNTER — Other Ambulatory Visit (HOSPITAL_BASED_OUTPATIENT_CLINIC_OR_DEPARTMENT_OTHER): Payer: BC Managed Care – PPO | Admitting: Lab

## 2012-05-04 ENCOUNTER — Encounter: Payer: Self-pay | Admitting: Oncology

## 2012-05-04 ENCOUNTER — Ambulatory Visit (HOSPITAL_BASED_OUTPATIENT_CLINIC_OR_DEPARTMENT_OTHER): Payer: BC Managed Care – PPO

## 2012-05-04 VITALS — BP 115/74 | HR 74 | Temp 98.3°F | Ht 67.0 in | Wt 170.4 lb

## 2012-05-04 DIAGNOSIS — Z170 Estrogen receptor positive status [ER+]: Secondary | ICD-10-CM

## 2012-05-04 DIAGNOSIS — C773 Secondary and unspecified malignant neoplasm of axilla and upper limb lymph nodes: Secondary | ICD-10-CM

## 2012-05-04 DIAGNOSIS — C50419 Malignant neoplasm of upper-outer quadrant of unspecified female breast: Secondary | ICD-10-CM

## 2012-05-04 DIAGNOSIS — Z5111 Encounter for antineoplastic chemotherapy: Secondary | ICD-10-CM

## 2012-05-04 LAB — CBC WITH DIFFERENTIAL/PLATELET
Basophils Absolute: 0.1 10*3/uL (ref 0.0–0.1)
Eosinophils Absolute: 0 10*3/uL (ref 0.0–0.5)
HCT: 28.7 % — ABNORMAL LOW (ref 34.8–46.6)
LYMPH%: 10 % — ABNORMAL LOW (ref 14.0–49.7)
MCV: 89.4 fL (ref 79.5–101.0)
MONO#: 0.7 10*3/uL (ref 0.1–0.9)
MONO%: 4.7 % (ref 0.0–14.0)
NEUT#: 12.7 10*3/uL — ABNORMAL HIGH (ref 1.5–6.5)
NEUT%: 84.6 % — ABNORMAL HIGH (ref 38.4–76.8)
Platelets: 170 10*3/uL (ref 145–400)
WBC: 15 10*3/uL — ABNORMAL HIGH (ref 3.9–10.3)

## 2012-05-04 LAB — COMPREHENSIVE METABOLIC PANEL
ALT: 21 U/L (ref 0–35)
BUN: 13 mg/dL (ref 6–23)
CO2: 27 mEq/L (ref 19–32)
Calcium: 10.1 mg/dL (ref 8.4–10.5)
Chloride: 105 mEq/L (ref 96–112)
Creatinine, Ser: 0.83 mg/dL (ref 0.50–1.10)
Glucose, Bld: 75 mg/dL (ref 70–99)

## 2012-05-04 MED ORDER — LIDOCAINE 5 % EX PTCH: 1.0000 | MEDICATED_PATCH | CUTANEOUS | Status: AC

## 2012-05-04 MED ORDER — FLUOROURACIL CHEMO INJECTION 2.5 GM/50ML
500.0000 mg/m2 | Freq: Once | INTRAVENOUS | Status: AC
  Administered 2012-05-04: 950 mg via INTRAVENOUS
  Filled 2012-05-04: qty 19

## 2012-05-04 MED ORDER — SODIUM CHLORIDE 0.9 % IV SOLN
150.0000 mg | Freq: Once | INTRAVENOUS | Status: AC
  Administered 2012-05-04: 150 mg via INTRAVENOUS
  Filled 2012-05-04: qty 5

## 2012-05-04 MED ORDER — SODIUM CHLORIDE 0.9 % IV SOLN
Freq: Once | INTRAVENOUS | Status: AC
  Administered 2012-05-04: 14:00:00 via INTRAVENOUS

## 2012-05-04 MED ORDER — HEPARIN SOD (PORK) LOCK FLUSH 100 UNIT/ML IV SOLN
500.0000 [IU] | Freq: Once | INTRAVENOUS | Status: AC | PRN
  Administered 2012-05-04: 500 [IU]
  Filled 2012-05-04: qty 5

## 2012-05-04 MED ORDER — PROCHLORPERAZINE MALEATE 10 MG PO TABS
10.0000 mg | ORAL_TABLET | Freq: Once | ORAL | Status: AC
  Administered 2012-05-04: 10 mg via ORAL

## 2012-05-04 MED ORDER — SODIUM CHLORIDE 0.9 % IJ SOLN
10.0000 mL | INTRAMUSCULAR | Status: DC | PRN
  Administered 2012-05-04: 10 mL
  Filled 2012-05-04: qty 10

## 2012-05-04 MED ORDER — DEXAMETHASONE SODIUM PHOSPHATE 4 MG/ML IJ SOLN
12.0000 mg | Freq: Once | INTRAMUSCULAR | Status: AC
  Administered 2012-05-04: 12 mg via INTRAVENOUS

## 2012-05-04 MED ORDER — SODIUM CHLORIDE 0.9 % IV SOLN
500.0000 mg/m2 | Freq: Once | INTRAVENOUS | Status: AC
  Administered 2012-05-04: 960 mg via INTRAVENOUS
  Filled 2012-05-04: qty 48

## 2012-05-04 MED ORDER — PALONOSETRON HCL INJECTION 0.25 MG/5ML
0.2500 mg | Freq: Once | INTRAVENOUS | Status: AC
  Administered 2012-05-04: 0.25 mg via INTRAVENOUS

## 2012-05-04 MED ORDER — EPIRUBICIN HCL CHEMO IV INJECTION 200 MG/100ML
100.0000 mg/m2 | Freq: Once | INTRAVENOUS | Status: AC
  Administered 2012-05-04: 192 mg via INTRAVENOUS
  Filled 2012-05-04: qty 96

## 2012-05-04 NOTE — Patient Instructions (Signed)
1. Apply lidoderm patch to the scar site as directed  2. Proceed with chemotherapy  3. Return in follow up in 1 week

## 2012-05-04 NOTE — Patient Instructions (Addendum)
[facility] Discharge Instructions for Patients Receiving Chemotherapy  Today you received the following chemotherapy agents Ellence, 5FU and Cytoxan  To help prevent nausea and vomiting after your treatment, we encourage you to take your nausea medication as directed.   If you develop nausea and vomiting that is not controlled by your nausea medication, call the clinic. If it is after clinic hours your family physician or the after hours number for the clinic or go to the Emergency Department.   BELOW ARE SYMPTOMS THAT SHOULD BE REPORTED IMMEDIATELY:  *FEVER GREATER THAN 100.5 F  *CHILLS WITH OR WITHOUT FEVER  NAUSEA AND VOMITING THAT IS NOT CONTROLLED WITH YOUR NAUSEA MEDICATION  *UNUSUAL SHORTNESS OF BREATH  *UNUSUAL BRUISING OR BLEEDING  TENDERNESS IN MOUTH AND THROAT WITH OR WITHOUT PRESENCE OF ULCERS  *URINARY PROBLEMS  *BOWEL PROBLEMS  UNUSUAL RASH Items with * indicate a potential emergency and should be followed up as soon as possible.  One of the nurses will contact you 24 hours after your treatment. Please let the nurse know about any problems that you may have experienced. Feel free to call the clinic you have any questions or concerns. The clinic phone number is [phone].   I have been informed and understand all the instructions given to me. I know to contact the clinic, my physician, or go to the Emergency Department if any problems should occur. I do not have any questions at this time, but understand that I may call the clinic during office hours   should I have any questions or need assistance in obtaining follow up care.    __________________________________________  _____________  __________ Signature of Patient or Authorized Representative            Date                   Time    __________________________________________ Nurse's Signature

## 2012-05-05 ENCOUNTER — Ambulatory Visit (HOSPITAL_BASED_OUTPATIENT_CLINIC_OR_DEPARTMENT_OTHER): Payer: BC Managed Care – PPO

## 2012-05-05 VITALS — BP 119/75 | HR 97 | Temp 98.6°F

## 2012-05-05 DIAGNOSIS — C773 Secondary and unspecified malignant neoplasm of axilla and upper limb lymph nodes: Secondary | ICD-10-CM

## 2012-05-05 DIAGNOSIS — Z5189 Encounter for other specified aftercare: Secondary | ICD-10-CM

## 2012-05-05 DIAGNOSIS — C50419 Malignant neoplasm of upper-outer quadrant of unspecified female breast: Secondary | ICD-10-CM

## 2012-05-05 MED ORDER — PEGFILGRASTIM INJECTION 6 MG/0.6ML
6.0000 mg | Freq: Once | SUBCUTANEOUS | Status: AC
  Administered 2012-05-05: 6 mg via SUBCUTANEOUS

## 2012-05-07 ENCOUNTER — Telehealth: Payer: Self-pay | Admitting: *Deleted

## 2012-05-07 NOTE — Telephone Encounter (Signed)
Per orders as scheduled for the patient

## 2012-05-10 ENCOUNTER — Encounter: Payer: Self-pay | Admitting: Oncology

## 2012-05-10 ENCOUNTER — Ambulatory Visit (HOSPITAL_BASED_OUTPATIENT_CLINIC_OR_DEPARTMENT_OTHER): Payer: BC Managed Care – PPO | Admitting: Family

## 2012-05-10 ENCOUNTER — Other Ambulatory Visit (HOSPITAL_BASED_OUTPATIENT_CLINIC_OR_DEPARTMENT_OTHER): Payer: BC Managed Care – PPO | Admitting: Lab

## 2012-05-10 ENCOUNTER — Telehealth: Payer: Self-pay | Admitting: *Deleted

## 2012-05-10 ENCOUNTER — Encounter: Payer: Self-pay | Admitting: *Deleted

## 2012-05-10 ENCOUNTER — Encounter: Payer: Self-pay | Admitting: Family

## 2012-05-10 VITALS — BP 125/79 | HR 75 | Temp 98.3°F | Ht 67.0 in | Wt 171.4 lb

## 2012-05-10 DIAGNOSIS — C50419 Malignant neoplasm of upper-outer quadrant of unspecified female breast: Secondary | ICD-10-CM

## 2012-05-10 DIAGNOSIS — C773 Secondary and unspecified malignant neoplasm of axilla and upper limb lymph nodes: Secondary | ICD-10-CM

## 2012-05-10 DIAGNOSIS — Z170 Estrogen receptor positive status [ER+]: Secondary | ICD-10-CM

## 2012-05-10 DIAGNOSIS — C50919 Malignant neoplasm of unspecified site of unspecified female breast: Secondary | ICD-10-CM | POA: Insufficient documentation

## 2012-05-10 LAB — COMPREHENSIVE METABOLIC PANEL
AST: 10 U/L (ref 0–37)
Albumin: 4 g/dL (ref 3.5–5.2)
BUN: 18 mg/dL (ref 6–23)
Calcium: 10.1 mg/dL (ref 8.4–10.5)
Chloride: 105 mEq/L (ref 96–112)
Glucose, Bld: 85 mg/dL (ref 70–99)
Potassium: 4.1 mEq/L (ref 3.5–5.3)
Sodium: 140 mEq/L (ref 135–145)
Total Protein: 6.2 g/dL (ref 6.0–8.3)

## 2012-05-10 LAB — CBC WITH DIFFERENTIAL/PLATELET
Basophils Absolute: 0 10*3/uL (ref 0.0–0.1)
Eosinophils Absolute: 0 10*3/uL (ref 0.0–0.5)
HGB: 9.8 g/dL — ABNORMAL LOW (ref 11.6–15.9)
MONO#: 0 10*3/uL — ABNORMAL LOW (ref 0.1–0.9)
NEUT#: 3.5 10*3/uL (ref 1.5–6.5)
RBC: 3.28 10*6/uL — ABNORMAL LOW (ref 3.70–5.45)
RDW: 17.7 % — ABNORMAL HIGH (ref 11.2–14.5)
WBC: 4 10*3/uL (ref 3.9–10.3)
lymph#: 0.4 10*3/uL — ABNORMAL LOW (ref 0.9–3.3)

## 2012-05-10 MED ORDER — PROCHLORPERAZINE MALEATE 10 MG PO TABS: 10.0000 mg | ORAL_TABLET | Freq: Four times a day (QID) | ORAL | Status: DC | PRN

## 2012-05-10 MED ORDER — LORAZEPAM 0.5 MG PO TABS: 0.5000 mg | ORAL_TABLET | Freq: Four times a day (QID) | ORAL | Status: DC | PRN

## 2012-05-10 MED ORDER — DEXAMETHASONE 4 MG PO TABS: ORAL_TABLET | ORAL | Status: DC

## 2012-05-10 MED ORDER — ONDANSETRON HCL 8 MG PO TABS: ORAL_TABLET | ORAL | Status: DC

## 2012-05-10 MED ORDER — PROCHLORPERAZINE 25 MG RE SUPP: 25.0000 mg | Freq: Two times a day (BID) | RECTAL | Status: DC | PRN

## 2012-05-10 NOTE — Progress Notes (Signed)
RECEIVED A FAX FROM [facility] CONCERNING A PRIOR AUTHORIZATION FOR ONDANSETRON. THIS REQUEST WAS PLACED IN THE MANAGED CARE ACTIVE WORK BIN FOR [facility].

## 2012-05-10 NOTE — Progress Notes (Signed)
  OFFICE PROGRESS NOTE  CC  [name], DO Dr. Emelia Loron Dr. 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  PRIOR THERAPY: 1. Left breast lumpectomy that revealed 2 foci of invasive ductal carcinoma measuring 1.3 cm and 0.8 cm, grade 2 with adjacent high-grade ductal carcinoma in situ with no evidence of angiolymphatic invasion. ER+ 97% PR +100% HER-2/neu negative, proliferation marker 48%. Second focus was ER +96% PR +99% HER-2/neu negative with a proliferation marker 74%. Total of 1/22 lymph nodes (retrieved in 2 separate dissections) positive for metastatic disease.  2. Adjuvant chemotherapy with FEC 100 q 2 weeks with day 2 neulasta began 03/23/12. Total of 4 cycles completed.    CURRENT THERAPY: Weekly Taxol for 12 weeks, will begin 05/23/12.   INTERVAL HISTORY: No vomiting. Chief complaint is weakness and fatigue. Son is out of school for the summer and he is very active. She states "it has been a very hard week". Reports she feels "lightheaded" occasionally.   Continues to question the rationale for weekly Taxol. Asks if she can delay initiation of Taxol until July 3. Also asks for 7/29-8/3 with no treatment.    No cough or shortness of breath. No abdominal pain or new bone pain. Bowel and bladder function are normal. Appetite is fair, eating fruit. Anal fissure is improved with stool softener, preparation H. Has occasional constipation. Pain is improved. Remainder of the 10 point  review of systems is negative.  ALLERGIES:  is allergic to shellfish-derived products; clarithromycin; and penicillins.  REVIEW OF SYSTEMS:  Pertinent items are noted in HPI.   PHYSICAL EXAMINATION:  General: Well developed, well nourished, in no acute distress.  EENT: No ocular or oral lesions. No stomatitis.  Respiratory: Lungs are clear to auscultation bilaterally with normal respiratory movement and no accessory muscle  use. Cardiac: No murmur, rub or tachycardia. No upper or lower extremity edema.  GI: Abdomen is soft, no palpable hepatosplenomegaly. No fluid wave. No tenderness. Musculoskeletal: No kyphosis, no tenderness over the spine, ribs or hips. Lymph: No cervical, infraclavicular, axillary or inguinal adenopathy. Neuro: No focal neurological deficits. Psych: Alert and oriented X 3, appropriate mood and affect.   ECOG PERFORMANCE STATUS: 1 - Symptomatic but completely ambulatory  Blood pressure 125/79, pulse 75, temperature 98.3 F (36.8 C), temperature source Oral, height 5\' 7"  (1.702 m), weight 171 lb 6.4 oz (77.747 kg).  LABORATORY DATA: Lab Results  Component Value Date   WBC 15.0* 05/04/2012   HGB 9.4* 05/04/2012   HCT 28.7* 05/04/2012   MCV 89.4 05/04/2012   PLT 170 05/04/2012    ASSESSMENT: 46 year old female with: 1. Left breast cancer, receiving adjuvant chemotherapy with fair tolerance.  2. Constipation  PLAN:  1. No treatment today, lab only.  2. Will return 7/3 for appt with me prior to starting Taxol.  New treatment plan is entered by Dr. Welton Flakes.  3. IV fluids for support as needed, none needed today.   All questions were answered. The patient knows to call the clinic with any problems, questions or concerns.

## 2012-05-10 NOTE — Telephone Encounter (Signed)
Made patient appointment 05-23-2012 starting at 9:30am printed out calendar and gave to the patient

## 2012-05-10 NOTE — Progress Notes (Unsigned)
Called [employer] @ [phone]

## 2012-05-10 NOTE — Progress Notes (Signed)
[employer] approved ondansetron 8mg  from 05/10/12-05/10/13.

## 2012-05-10 NOTE — Progress Notes (Signed)
OFFICE PROGRESS NOTE  CC  Alisha Freud, DO [phone] [address] [facility] [address] [place] [address] Dr. Emelia Loron Dr. 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 Patient is now status post left breast lumpectomy with left axillary lymph node dissection performed on 02/10/2012  PRIOR THERAPY:  #1 patient underwent a left breast lumpectomy that revealed 2 foci of invasive ductal carcinoma measuring 1.3 cm and 0.8 cm grade 2 with adjacent high-grade ductal carcinoma in situ with no evidence of angiolymphatic invasion. The tumor was estrogen receptor +97% progesterone receptor +100% HER-2/neu negative proliferation marker 48% second focus was ER +96% PR +99% HER-2/neu negative with a proliferation marker 74%. 1/22 lymph nodes were examined all of which were positive for metastatic disease  .#2 She will start adjuvant chemotherapy with FEC 100 every 2 weeks with day 2 Neulasta. A total of 4 cycles of this as planned. Thereafter she will receive weekly Taxol for a total of 12 weeks. We will begin 03/23/2012   CURRENT THERAPY: s/p adjuvant chemotherapy with FEC 100 q 2 weeks with day 2 neulasta.  Cycle 2   INTERVAL HISTORY: Alisha Chen 46 y.o. female returns for Followup visit. Overall she's doing well she tolerated cycle 2 but she is complaining of being a little woozy and dizzy. She's try to drink as much as she possibly can. She is also having some difficulty with nausea. She does develop headaches with taking Zofran so she is on Zyprexa.She has not had any bleeding problems no peripheral paresthesias no abdominal pain. She is suffering from constipation she is trying to take stool softeners. I have received instructed her to take these again. She is also developed because of the  constipation and anal fissure. Symptomatic management is recommended. Patient currently is not neutropenic. Her main her of the 10 point review of systems is negative.  MEDICAL HISTORY: Past Medical History  Diagnosis Date  . Back pain   . Hx: UTI (urinary tract infection)   . Cancer     LT BREAST    ALLERGIES:  is allergic to shellfish-derived products; clarithromycin; and penicillins.  MEDICATIONS:  Current Outpatient Prescriptions  Medication Sig Dispense Refill  . dexamethasone (DECADRON) 4 MG tablet Take 2 tablets by mouth once a day on the day after chemotherapy and then take 2 tablets two times a day for 2 days. Take with food.  30 tablet  1  . ferrous sulfate 325 (65 FE) MG EC tablet Take 325 mg by mouth 3 (three) times daily with meals.      . lidocaine-prilocaine (EMLA) cream Apply topically as needed. Apply to port area prior to chemotherapy  30 g  3  . LORazepam (ATIVAN) 0.5 MG tablet Take 1 tablet (0.5 mg total) by mouth every 6 (six) hours as needed (Nausea or vomiting).  30 tablet  0  . OLANZapine (ZYPREXA) 10 MG tablet Take 1 tablet (10 mg total) by mouth at bedtime. Begin day after chemo and take for 3 days. (Day 2-4 of chemo cycle).  18 tablet  0  . prochlorperazine (COMPAZINE) 10 MG tablet Take 1 tablet (10 mg total) by mouth every 6 (six) hours as needed (Nausea or vomiting).  30 tablet  1  . prochlorperazine (COMPAZINE) 25 MG suppository Place 1 suppository (25 mg  total) rectally every 12 (twelve) hours as needed for nausea.  12 suppository  3  . lidocaine (LIDODERM) 5 % Place 1 patch onto the skin daily. Remove & Discard patch within 12 hours or as directed by MD  20 patch  0    SURGICAL HISTORY:  Past Surgical History  Procedure Date  . Spine surgery 01/2011  . Foot surgery     multiple  . Back surgery     LUMBAR DECOMPRESSION  . Portacath placement 02/10/2012    Procedure: INSERTION PORT-A-CATH;  Surgeon: Emelia Loron, MD;  Location: [facility];  Service: General;  Laterality: Right;  . Breast surgery 2013    left breast lumpectomy, left alnd    REVIEW OF SYSTEMS:  Pertinent items are noted in HPI.   PHYSICAL EXAMINATION: General appearance: alert, cooperative and appears stated age Lymph nodes: Cervical, supraclavicular, and axillary nodes normal. Resp: clear to auscultation bilaterally Back: symmetric, no curvature. ROM normal. No CVA tenderness. Cardio: regular rate and rhythm, S1, S2 normal, no murmur, click, rub or gallop GI: soft, non-tender; bowel sounds normal; no masses,  no organomegaly Extremities: extremities normal, atraumatic, no cyanosis or edema Neurologic: Grossly normal  ECOG PERFORMANCE STATUS: 1 - Symptomatic but completely ambulatory  Blood pressure 115/74, pulse 74, temperature 98.3 F (36.8 C), temperature source Oral, height 5\' 7"  (1.702 m), weight 170 lb 6.4 oz (77.293 kg).  LABORATORY DATA: Lab Results  Component Value Date   WBC 15.0* 05/04/2012   HGB 9.4* 05/04/2012   HCT 28.7* 05/04/2012   MCV 89.4 05/04/2012   PLT 170 05/04/2012      Chemistry      Component Value Date/Time   NA 139 05/04/2012 1234   K 4.2 05/04/2012 1234   CL 105 05/04/2012 1234   CO2 27 05/04/2012 1234   BUN 13 05/04/2012 1234   CREATININE 0.83 05/04/2012 1234      Component Value Date/Time   CALCIUM 10.1 05/04/2012 1234   ALKPHOS 100 05/04/2012 1234   AST 15 05/04/2012 1234   ALT 21 05/04/2012 1234   BILITOT 0.3 05/04/2012 1234       RADIOGRAPHIC STUDIES:  ASSESSMENT: 46 year old female with:  1.  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 The primary tumor revealed 2 foci measuring 1.3 and 0.8 cm.with 1/22 LN positive for metastatic disease   #2 Proceed with cycle 4 FEC 100.Overall she has tolerated her chemotherapy well.  PLAN:  #1 patient will proceed with cycle #4 of FEC 100. She will return tomorrow for Neulasta. One week she  will have her interim labs.  #2 once patient has completed 4 cycles of FEC 100 she will then begin weekly Taxol for a total of 12 weeks. After that she will receive radiation therapy. Upon completion of radiation therapy she will begin antiestrogen therapy with tamoxifen since her tumor is ER positive.  All questions were answered. The patient knows to call the clinic with any problems, questions or concerns. We can certainly see the patient much sooner if necessary.  I spent > counseling the patient face to face. The total time spent in the appointment was 30 minutes.    Drue Second, MD Medical/Oncology [facility] [phone] (beeper) [phone] (Office)

## 2012-05-11 ENCOUNTER — Other Ambulatory Visit: Payer: BC Managed Care – PPO | Admitting: Lab

## 2012-05-18 ENCOUNTER — Other Ambulatory Visit: Payer: BC Managed Care – PPO | Admitting: Lab

## 2012-05-18 ENCOUNTER — Ambulatory Visit: Payer: BC Managed Care – PPO

## 2012-05-23 ENCOUNTER — Telehealth: Payer: Self-pay | Admitting: *Deleted

## 2012-05-23 ENCOUNTER — Encounter: Payer: Self-pay | Admitting: Oncology

## 2012-05-23 ENCOUNTER — Other Ambulatory Visit (HOSPITAL_BASED_OUTPATIENT_CLINIC_OR_DEPARTMENT_OTHER): Payer: BC Managed Care – PPO | Admitting: Lab

## 2012-05-23 ENCOUNTER — Ambulatory Visit (HOSPITAL_BASED_OUTPATIENT_CLINIC_OR_DEPARTMENT_OTHER): Payer: BC Managed Care – PPO | Admitting: Oncology

## 2012-05-23 ENCOUNTER — Ambulatory Visit (HOSPITAL_BASED_OUTPATIENT_CLINIC_OR_DEPARTMENT_OTHER): Payer: BC Managed Care – PPO

## 2012-05-23 VITALS — BP 101/79 | HR 79 | Temp 97.9°F

## 2012-05-23 VITALS — BP 92/66 | HR 92 | Temp 98.4°F | Ht 67.0 in | Wt 168.6 lb

## 2012-05-23 DIAGNOSIS — Z5111 Encounter for antineoplastic chemotherapy: Secondary | ICD-10-CM

## 2012-05-23 DIAGNOSIS — C50419 Malignant neoplasm of upper-outer quadrant of unspecified female breast: Secondary | ICD-10-CM

## 2012-05-23 DIAGNOSIS — Z170 Estrogen receptor positive status [ER+]: Secondary | ICD-10-CM

## 2012-05-23 DIAGNOSIS — C773 Secondary and unspecified malignant neoplasm of axilla and upper limb lymph nodes: Secondary | ICD-10-CM

## 2012-05-23 LAB — CBC WITH DIFFERENTIAL/PLATELET
Basophils Absolute: 0 10*3/uL (ref 0.0–0.1)
EOS%: 1.1 % (ref 0.0–7.0)
HGB: 10.9 g/dL — ABNORMAL LOW (ref 11.6–15.9)
MCH: 30 pg (ref 25.1–34.0)
MCV: 92.9 fL (ref 79.5–101.0)
MONO%: 5.7 % (ref 0.0–14.0)
NEUT#: 4.1 10*3/uL (ref 1.5–6.5)
RBC: 3.62 10*6/uL — ABNORMAL LOW (ref 3.70–5.45)
RDW: 17.4 % — ABNORMAL HIGH (ref 11.2–14.5)
lymph#: 0.7 10*3/uL — ABNORMAL LOW (ref 0.9–3.3)

## 2012-05-23 LAB — COMPREHENSIVE METABOLIC PANEL
ALT: 13 U/L (ref 0–35)
AST: 16 U/L (ref 0–37)
Albumin: 4.2 g/dL (ref 3.5–5.2)
Alkaline Phosphatase: 81 U/L (ref 39–117)
BUN: 13 mg/dL (ref 6–23)
Calcium: 10.8 mg/dL — ABNORMAL HIGH (ref 8.4–10.5)
Chloride: 104 mEq/L (ref 96–112)
Potassium: 4.5 mEq/L (ref 3.5–5.3)
Sodium: 140 mEq/L (ref 135–145)
Total Protein: 6.9 g/dL (ref 6.0–8.3)

## 2012-05-23 MED ORDER — HEPARIN SOD (PORK) LOCK FLUSH 100 UNIT/ML IV SOLN
500.0000 [IU] | Freq: Once | INTRAVENOUS | Status: AC | PRN
  Administered 2012-05-23: 500 [IU]
  Filled 2012-05-23: qty 5

## 2012-05-23 MED ORDER — FAMOTIDINE IN NACL 20-0.9 MG/50ML-% IV SOLN
20.0000 mg | Freq: Once | INTRAVENOUS | Status: AC
  Administered 2012-05-23: 20 mg via INTRAVENOUS

## 2012-05-23 MED ORDER — SODIUM CHLORIDE 0.9 % IV SOLN
Freq: Once | INTRAVENOUS | Status: AC
Start: 2012-05-23 — End: 2012-05-23
  Administered 2012-05-23: 11:00:00 via INTRAVENOUS

## 2012-05-23 MED ORDER — PACLITAXEL CHEMO INJECTION 300 MG/50ML
80.0000 mg/m2 | Freq: Once | INTRAVENOUS | Status: AC
  Administered 2012-05-23: 150 mg via INTRAVENOUS
  Filled 2012-05-23: qty 25

## 2012-05-23 MED ORDER — SODIUM CHLORIDE 0.9 % IJ SOLN
10.0000 mL | INTRAMUSCULAR | Status: DC | PRN
  Administered 2012-05-23: 10 mL
  Filled 2012-05-23: qty 10

## 2012-05-23 MED ORDER — DIPHENHYDRAMINE HCL 50 MG/ML IJ SOLN
50.0000 mg | Freq: Once | INTRAMUSCULAR | Status: AC
  Administered 2012-05-23: 50 mg via INTRAVENOUS

## 2012-05-23 MED ORDER — ONDANSETRON 8 MG/50ML IVPB (CHCC)
8.0000 mg | Freq: Once | INTRAVENOUS | Status: AC
  Administered 2012-05-23: 8 mg via INTRAVENOUS

## 2012-05-23 MED ORDER — DEXAMETHASONE SODIUM PHOSPHATE 4 MG/ML IJ SOLN
20.0000 mg | Freq: Once | INTRAMUSCULAR | Status: AC
  Administered 2012-05-23: 20 mg via INTRAVENOUS

## 2012-05-23 NOTE — Telephone Encounter (Signed)
Per staff message I have scheudled appts.  JMW

## 2012-05-23 NOTE — Progress Notes (Signed)
Pt discharged to home.  Reported fatigue.  No complaints.

## 2012-05-23 NOTE — Patient Instructions (Addendum)
[facility] Discharge Instructions for Patients Receiving Chemotherapy  Today you received the following chemotherapy agents Taxol.  To help prevent nausea and vomiting after your treatment, we encourage you to take your nausea medication at bedtime tonight and then as prescribed.     If you develop nausea and vomiting that is not controlled by your nausea medication, call the clinic. If it is after clinic hours your family physician or the after hours number for the clinic or go to the Emergency Department.   BELOW ARE SYMPTOMS THAT SHOULD BE REPORTED IMMEDIATELY:  *FEVER GREATER THAN 100.5 F  *CHILLS WITH OR WITHOUT FEVER  NAUSEA AND VOMITING THAT IS NOT CONTROLLED WITH YOUR NAUSEA MEDICATION  *UNUSUAL SHORTNESS OF BREATH  *UNUSUAL BRUISING OR BLEEDING  TENDERNESS IN MOUTH AND THROAT WITH OR WITHOUT PRESENCE OF ULCERS  *URINARY PROBLEMS  *BOWEL PROBLEMS  UNUSUAL RASH Items with * indicate a potential emergency and should be followed up as soon as possible.  One of the nurses will contact you 24 hours after your treatment. Please let the nurse know about any problems that you may have experienced. Feel free to call the clinic you have any questions or concerns. The clinic phone number is [phone].   I have been informed and understand all the instructions given to me. I know to contact the clinic, my physician, or go to the Emergency Department if any problems should occur. I do not have any questions at this time, but understand that I may call the clinic during office hours   should I have any questions or need assistance in obtaining follow up care.    __________________________________________  _____________  __________ Signature of Patient or Authorized Representative            Date                   Time    __________________________________________ Nurse's Signature   Paclitaxel injection  (Taxol) What is this medicine? PACLITAXEL (PAK li  TAX el) is a chemotherapy drug. It targets fast dividing cells, like cancer cells, and causes these cells to die. This medicine is used to treat ovarian cancer, breast cancer, and other cancers. This medicine may be used for other purposes; ask your health care provider or pharmacist if you have questions. What should I tell my health care provider before I take this medicine? They need to know if you have any of these conditions: -blood disorders -irregular heartbeat -infection (especially a virus infection such as chickenpox, cold sores, or herpes) -liver disease -previous or ongoing radiation therapy -an unusual or allergic reaction to paclitaxel, alcohol, polyoxyethylated castor oil, other chemotherapy agents, other medicines, foods, dyes, or preservatives -pregnant or trying to get pregnant -breast-feeding How should I use this medicine? This drug is given as an infusion into a vein. It is administered in a hospital or clinic by a specially trained health care professional. Talk to your pediatrician regarding the use of this medicine in children. Special care may be needed. Overdosage: If you think you have taken too much of this medicine contact a poison control center or emergency room at once. NOTE: This medicine is only for you. Do not share this medicine with others. What if I miss a dose? It is important not to miss your dose. Call your doctor or health care professional if you are unable to keep an appointment. What may interact with this medicine? Do not take this medicine with any of  the following medications: -disulfiram -metronidazole This medicine may also interact with the following medications: -cyclosporine -dexamethasone -diazepam -ketoconazole -medicines to increase blood counts like filgrastim, pegfilgrastim, sargramostim -other chemotherapy drugs like cisplatin, doxorubicin, epirubicin, etoposide, teniposide,  vincristine -quinidine -testosterone -vaccines -verapamil Talk to your doctor or health care professional before taking any of these medicines: -acetaminophen -aspirin -ibuprofen -ketoprofen -naproxen This list may not describe all possible interactions. Give your health care provider a list of all the medicines, herbs, non-prescription drugs, or dietary supplements you use. Also tell them if you smoke, drink alcohol, or use illegal drugs. Some items may interact with your medicine. What should I watch for while using this medicine? Your condition will be monitored carefully while you are receiving this medicine. You will need important blood work done while you are taking this medicine. This drug may make you feel generally unwell. This is not uncommon, as chemotherapy can affect healthy cells as well as cancer cells. Report any side effects. Continue your course of treatment even though you feel ill unless your doctor tells you to stop. In some cases, you may be given additional medicines to help with side effects. Follow all directions for their use. Call your doctor or health care professional for advice if you get a fever, chills or sore throat, or other symptoms of a cold or flu. Do not treat yourself. This drug decreases your body's ability to fight infections. Try to avoid being around people who are sick. This medicine may increase your risk to bruise or bleed. Call your doctor or health care professional if you notice any unusual bleeding. Be careful brushing and flossing your teeth or using a toothpick because you may get an infection or bleed more easily. If you have any dental work done, tell your dentist you are receiving this medicine. Avoid taking products that contain aspirin, acetaminophen, ibuprofen, naproxen, or ketoprofen unless instructed by your doctor. These medicines may hide a fever. Do not become pregnant while taking this medicine. Women should inform their doctor if  they wish to become pregnant or think they might be pregnant. There is a potential for serious side effects to an unborn child. Talk to your health care professional or pharmacist for more information. Do not breast-feed an infant while taking this medicine. Men are advised not to father a child while receiving this medicine. What side effects may I notice from receiving this medicine? Side effects that you should report to your doctor or health care professional as soon as possible: -allergic reactions like skin rash, itching or hives, swelling of the face, lips, or tongue -low blood counts - This drug may decrease the number of white blood cells, red blood cells and platelets. You may be at increased risk for infections and bleeding. -signs of infection - fever or chills, cough, sore throat, pain or difficulty passing urine -signs of decreased platelets or bleeding - bruising, pinpoint red spots on the skin, black, tarry stools, nosebleeds -signs of decreased red blood cells - unusually weak or tired, fainting spells, lightheadedness -breathing problems -chest pain -high or low blood pressure -mouth sores -nausea and vomiting -pain, swelling, redness or irritation at the injection site -pain, tingling, numbness in the hands or feet -slow or irregular heartbeat -swelling of the ankle, feet, hands Side effects that usually do not require medical attention (report to your doctor or health care professional if they continue or are bothersome): -bone pain -complete hair loss including hair on your head, underarms, pubic hair, eyebrows,  and eyelashes -changes in the color of fingernails -diarrhea -loosening of the fingernails -loss of appetite -muscle or joint pain -red flush to skin -sweating This list may not describe all possible side effects. Call your doctor for medical advice about side effects. You may report side effects to FDA at 1-800-FDA-1088. Where should I keep my  medicine? This drug is given in a hospital or clinic and will not be stored at home. NOTE: This sheet is a summary. It may not cover all possible information. If you have questions about this medicine, talk to your doctor, pharmacist, or health care provider.  2012, [employer]. (10/20/2008 11:54:26 AM)

## 2012-05-23 NOTE — Patient Instructions (Addendum)
1. You will begin taxol today weekly for 12 weeks.  2. Call with any problems  3. I will see you back on 7/10 with labs and chemotherapy on the same day

## 2012-05-23 NOTE — Progress Notes (Signed)
OFFICE PROGRESS NOTE  CC  Alisha Freud, DO [phone] [address] [facility] [address] [place] [address] Dr. Emelia Loron Dr. 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 Patient is now status post left breast lumpectomy with left axillary lymph node dissection performed on 02/10/2012  PRIOR THERAPY:  #1 patient underwent a left breast lumpectomy that revealed 2 foci of invasive ductal carcinoma measuring 1.3 cm and 0.8 cm grade 2 with adjacent high-grade ductal carcinoma in situ with no evidence of angiolymphatic invasion. The tumor was estrogen receptor +97% progesterone receptor +100% HER-2/neu negative proliferation marker 48% second focus was ER +96% PR +99% HER-2/neu negative with a proliferation marker 74%. 1/22 lymph nodes were examined all of which were positive for metastatic disease  .#2 Patient is now status post 4 cycles of FEC 100 given between 03/23/2012 2 05/10/2012. She completed all of her therapy and tolerated it reasonably well.  #3 she will now begin single agent weekly Taxol at 80 mg per meter squared x12 weeks starting 05/23/2012.  CURRENT THERAPY: Patient is here for week #1/12 of Taxol  INTERVAL HISTORY: Alisha Chen 46 y.o. female returns for Followup visit.All in all patient is doing well she is still weak tired fatigued she does have some hemorrhoids as well as constipation off-and-on. She has been having some difficulty with urination she is on Macrodantin. She has been taking preparation H. And milk of magnesia for the hemorrhoids and constipation respectively. 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 Patient is quite concerned about taking Taxol we discussed this extensively. She understands the rationale for the Taxol  therapy. She is hesitant in terms of the number of doses that are planned. I did tell her that we will reevaluate after 6 cycles. Patient does have some pain in the rectal area due to your hemorrhoids. She otherwise has not had any bleeding from anywhere else. Her overall performance status is about a 2. MEDICAL HISTORY: Past Medical History  Diagnosis Date  . Back pain   . Hx: UTI (urinary tract infection)   . Breast cancer     ALLERGIES:  is allergic to shellfish-derived products; clarithromycin; and penicillins.  MEDICATIONS:  Current Outpatient Prescriptions  Medication Sig Dispense Refill  . dexamethasone (DECADRON) 4 MG tablet Take 2 tablets by mouth daily starting the day after chemotherapy for 2 days. Take with food.  30 tablet  1  . lidocaine (LIDODERM) 5 % Place 1 patch onto the skin daily. Remove & Discard patch within 12 hours or as directed by MD  20 patch  0  . lidocaine-prilocaine (EMLA) cream Apply topically as needed. Apply to port area prior to chemotherapy  30 g  3  . LORazepam (ATIVAN) 0.5 MG tablet Take 1 tablet (0.5 mg total) by mouth every 6 (six) hours as needed (Nausea or vomiting).  30 tablet  0  . nitrofurantoin (MACRODANTIN) 100 MG capsule Take 100 mg by mouth 2 (two) times daily.      . ondansetron (ZOFRAN) 8 MG tablet Take 1 tablet two times a day starting the day after chemo for 2 days. Then take 1 tablet two times a day as needed for nausea or vomiting.  30 tablet  1  . prochlorperazine (  COMPAZINE) 10 MG tablet Take 1 tablet (10 mg total) by mouth every 6 (six) hours as needed (Nausea or vomiting).  30 tablet  1  . prochlorperazine (COMPAZINE) 25 MG suppository Place 1 suppository (25 mg total) rectally every 12 (twelve) hours as needed for nausea.  12 suppository  3  . ferrous sulfate 325 (65 FE) MG EC tablet Take 325 mg by mouth 3 (three) times daily with meals.        SURGICAL HISTORY:  Past Surgical History  Procedure Date  . Spine surgery 01/2011   . Foot surgery     multiple  . Back surgery     LUMBAR DECOMPRESSION  . Portacath placement 02/10/2012    Procedure: INSERTION PORT-A-CATH;  Surgeon: Emelia Loron, MD;  Location: [facility];  Service: General;  Laterality: Right;  . Breast surgery 2013    left breast lumpectomy, left alnd    REVIEW OF SYSTEMS:  Pertinent items are noted in HPI.   PHYSICAL EXAMINATION: General appearance: alert, cooperative and appears stated age Lymph nodes: Cervical, supraclavicular, and axillary nodes normal. Resp: clear to auscultation bilaterally Back: symmetric, no curvature. ROM normal. No CVA tenderness. Cardio: regular rate and rhythm, S1, S2 normal, no murmur, click, rub or gallop GI: soft, non-tender; bowel sounds normal; no masses,  no organomegaly Extremities: extremities normal, atraumatic, no cyanosis or edema Neurologic: Grossly normal  ECOG PERFORMANCE STATUS: 2  Blood pressure 92/66, pulse 92, temperature 98.4 F (36.9 C), temperature source Oral, height 5\' 7"  (1.702 m), weight 168 lb 9.6 oz (76.476 kg).  LABORATORY DATA: Lab Results  Component Value Date   WBC 5.2 05/23/2012   HGB 10.9* 05/23/2012   HCT 33.6* 05/23/2012   MCV 92.9 05/23/2012   PLT 210 05/23/2012      Chemistry      Component Value Date/Time   NA 140 05/10/2012 1038   K 4.1 05/10/2012 1038   CL 105 05/10/2012 1038   CO2 29 05/10/2012 1038   BUN 18 05/10/2012 1038   CREATININE 0.72 05/10/2012 1038      Component Value Date/Time   CALCIUM 10.1 05/10/2012 1038   ALKPHOS 116 05/10/2012 1038   AST 10 05/10/2012 1038   ALT 11 05/10/2012 1038   BILITOT 0.5 05/10/2012 1038       RADIOGRAPHIC STUDIES:  ASSESSMENT: 46 year old female with:  1.  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 The primary tumor revealed 2 foci measuring 1.3 and 0.8 cm.with 1/22 LN positive for metastatic disease. She has now completed 4 cycles of FEC 100.  She overall tolerated this well.  #2 patient will begin Taxol as a single agent weekly for 12 weeks starting today 05/23/2012.  PLAN:   #1 patient will proceed with her scheduled Taxol therapy today. Risks and benefits of the treatment were discussed with the patient today.  #2 patient knows to call me with any problems questions or concerns should they arise since this is her first infusion of Taxol.  #3 I will plan on seeing her back in one week's time for her week #2 of Taxol  All questions were answered. The patient knows to call the clinic with any problems, questions or concerns. We can certainly see the patient much sooner if necessary.  I spent > counseling the patient face to face. The total time spent in the appointment was 30 minutes.    Drue Second, MD Medical/Oncology [facility] [phone] (beeper) [phone] (Office)

## 2012-05-25 ENCOUNTER — Telehealth: Payer: Self-pay | Admitting: Medical Oncology

## 2012-05-25 ENCOUNTER — Other Ambulatory Visit: Payer: BC Managed Care – PPO | Admitting: Lab

## 2012-05-25 ENCOUNTER — Telehealth: Payer: Self-pay | Admitting: *Deleted

## 2012-05-25 ENCOUNTER — Encounter: Payer: Self-pay | Admitting: Medical Oncology

## 2012-05-25 NOTE — Progress Notes (Signed)
Previous entry was a late entry from 7/3

## 2012-05-25 NOTE — Telephone Encounter (Signed)
NO PROBLEMS WITH NAUSEA OR VOMITING SINCE PT. ARRIVED HOME AFTER TREATMENT. SHE IS CONCERNED ABOUT PRE MEDICATIONS GIVEN BEFORE HER TREATMENT ON 05/23/12. NO DIARRHEA. PT. TENDS TO HAVE PROBLEMS WITH CONSTIPATION. LAST BOWEL MOVEMENT WAS 05/23/12. SHE IS TAKING A STOOL SOFTNER AND FORCING FLUIDS. APPETITE IS FAIR. NO PROBLEMS WITH MOUTH SORENESS. PT. WILL TALK WITH DR.KHAN CONCERNING PRE MEDICATIONS BEFORE HER NEXT TREATMENT ON 05/30/12. SHE WILL CALL THIS OFFICE OR THE ON CALL PHYSICIAN AFTER HOURS IF NEEDED.

## 2012-05-25 NOTE — Telephone Encounter (Signed)
Received call from patient after returning home from chemotherapy infusion.  "I think they gave me Zofran and I am not suppose to be getting that because it causes me to get headaches. I already talked with them about this and I am not suppose to get this medicine."  Confirmed patient did receive Zofran with her chemo treatment today, advised patient that I would make Dr. Welton Flakes aware that she requests not to receive the Zofran with further treatments.  Patient expressed understanding, no further questions at this time.  Instructed patient to call clinic with any questions or concerns.

## 2012-05-28 ENCOUNTER — Telehealth: Payer: Self-pay | Admitting: Oncology

## 2012-05-28 NOTE — Telephone Encounter (Signed)
S/w the pt and she is aware of her July 9th appts and to pick up her schedule at that time

## 2012-05-29 ENCOUNTER — Other Ambulatory Visit (HOSPITAL_BASED_OUTPATIENT_CLINIC_OR_DEPARTMENT_OTHER): Payer: BC Managed Care – PPO | Admitting: Lab

## 2012-05-29 ENCOUNTER — Ambulatory Visit (HOSPITAL_BASED_OUTPATIENT_CLINIC_OR_DEPARTMENT_OTHER): Payer: BC Managed Care – PPO | Admitting: Oncology

## 2012-05-29 VITALS — BP 118/78 | HR 125 | Temp 98.4°F | Ht 67.0 in | Wt 170.8 lb

## 2012-05-29 DIAGNOSIS — Z170 Estrogen receptor positive status [ER+]: Secondary | ICD-10-CM

## 2012-05-29 DIAGNOSIS — R5383 Other fatigue: Secondary | ICD-10-CM

## 2012-05-29 DIAGNOSIS — R5381 Other malaise: Secondary | ICD-10-CM

## 2012-05-29 DIAGNOSIS — R110 Nausea: Secondary | ICD-10-CM

## 2012-05-29 DIAGNOSIS — C50419 Malignant neoplasm of upper-outer quadrant of unspecified female breast: Secondary | ICD-10-CM

## 2012-05-29 DIAGNOSIS — C50919 Malignant neoplasm of unspecified site of unspecified female breast: Secondary | ICD-10-CM

## 2012-05-29 LAB — COMPREHENSIVE METABOLIC PANEL
Alkaline Phosphatase: 77 U/L (ref 39–117)
CO2: 28 mEq/L (ref 19–32)
Creatinine, Ser: 0.84 mg/dL (ref 0.50–1.10)
Glucose, Bld: 97 mg/dL (ref 70–99)
Total Bilirubin: 0.2 mg/dL — ABNORMAL LOW (ref 0.3–1.2)

## 2012-05-29 LAB — CBC WITH DIFFERENTIAL/PLATELET
Basophils Absolute: 0.1 10*3/uL (ref 0.0–0.1)
EOS%: 2.8 % (ref 0.0–7.0)
Eosinophils Absolute: 0.1 10*3/uL (ref 0.0–0.5)
HGB: 9.3 g/dL — ABNORMAL LOW (ref 11.6–15.9)
LYMPH%: 18.4 % (ref 14.0–49.7)
MCH: 30.1 pg (ref 25.1–34.0)
MCV: 92.2 fL (ref 79.5–101.0)
MONO%: 5.2 % (ref 0.0–14.0)
NEUT#: 3.6 10*3/uL (ref 1.5–6.5)
Platelets: 280 10*3/uL (ref 145–400)
RBC: 3.09 10*6/uL — ABNORMAL LOW (ref 3.70–5.45)
RDW: 15.9 % — ABNORMAL HIGH (ref 11.2–14.5)

## 2012-05-29 NOTE — Progress Notes (Signed)
OFFICE PROGRESS NOTE  CC  [name], DO [phone] [address] [facility] [address] [place] [address] Dr. Emelia Loron Dr. 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 Patient is now status post left breast lumpectomy with left axillary lymph node dissection performed on 02/10/2012  PRIOR THERAPY:  #1 patient underwent a left breast lumpectomy that revealed 2 foci of invasive ductal carcinoma measuring 1.3 cm and 0.8 cm grade 2 with adjacent high-grade ductal carcinoma in situ with no evidence of angiolymphatic invasion. The tumor was estrogen receptor +97% progesterone receptor +100% HER-2/neu negative proliferation marker 48% second focus was ER +96% PR +99% HER-2/neu negative with a proliferation marker 74%. 1/22 lymph nodes were examined all of which were positive for metastatic disease  .#2 Patient is now status post 4 cycles of FEC 100 given between 03/23/2012 2 05/10/2012. She completed all of her therapy and tolerated it reasonably well.  #3 she will now begin single agent weekly Taxol at 80 mg per meter squared x12 weeks starting 05/23/2012.  CURRENT THERAPY: S/P 1 cycle of Taxol on 05/23/12  INTERVAL HISTORY: Alisha Chen 46 y.o. female returns for Followup visit.Patient received first cycle of single agent Taxol on 05/23/2012. She tells me that during her infusion she became very sick with regurgitation which has concerned her very much. Also being at home after the infusion she became very tired unable to do anything at home fatigued with nausea regurgitation symptoms and she feels that she is just not able to tolerate any more chemotherapy. She is not able to carry on activities of daily living she spends most of her time in bed or in a chair. She is ambulatory but she can barely  get herself to do much. She has not had any fevers chills she does have night sweats she does feel some numbness in the left breast at the surgical site. She has no bleeding problems. Remainder of the 10 point review of systems is unremarkable.  MEDICAL HISTORY: Past Medical History  Diagnosis Date  . Back pain   . Hx: UTI (urinary tract infection)   . Breast cancer     ALLERGIES:  is allergic to shellfish-derived products; clarithromycin; ondansetron; and penicillins.  MEDICATIONS:  Current Outpatient Prescriptions  Medication Sig Dispense Refill  . dexamethasone (DECADRON) 4 MG tablet Take 2 tablets by mouth daily starting the day after chemotherapy for 2 days. Take with food.  30 tablet  1  . lidocaine (LIDODERM) 5 % Place 1 patch onto the skin daily. Remove & Discard patch within 12 hours or as directed by MD  20 patch  0  . lidocaine-prilocaine (EMLA) cream Apply topically as needed. Apply to port area prior to chemotherapy  30 g  3  . LORazepam (ATIVAN) 0.5 MG tablet Take 1 tablet (0.5 mg total) by mouth every 6 (six) hours as needed (Nausea or vomiting).  30 tablet  0  . prochlorperazine (COMPAZINE) 10 MG tablet Take 1 tablet (10 mg total) by mouth every 6 (six) hours as needed (Nausea or vomiting).  30 tablet  1  . prochlorperazine (COMPAZINE) 25 MG suppository Place 1 suppository (25 mg total) rectally every 12 (twelve) hours as needed for nausea.  12 suppository  3  . ferrous sulfate 325 (65 FE) MG EC tablet  Take 325 mg by mouth 3 (three) times daily with meals.      . nitrofurantoin (MACRODANTIN) 100 MG capsule Take 100 mg by mouth 2 (two) times daily.        SURGICAL HISTORY:  Past Surgical History  Procedure Date  . Spine surgery 01/2011  . Foot surgery     multiple  . Back surgery     LUMBAR DECOMPRESSION  . Portacath placement 02/10/2012    Procedure: INSERTION PORT-A-CATH;  Surgeon: Emelia Loron, MD;  Location: [facility];  Service: General;   Laterality: Right;  . Breast surgery 2013    left breast lumpectomy, left alnd    REVIEW OF SYSTEMS:  Pertinent items are noted in HPI.   PHYSICAL EXAMINATION: General appearance: alert, cooperative and appears stated age Lymph nodes: Cervical, supraclavicular, and axillary nodes normal. Resp: clear to auscultation bilaterally Back: symmetric, no curvature. ROM normal. No CVA tenderness. Cardio: regular rate and rhythm, S1, S2 normal, no murmur, click, rub or gallop GI: soft, non-tender; bowel sounds normal; no masses,  no organomegaly Extremities: extremities normal, atraumatic, no cyanosis or edema Neurologic: Grossly normal  ECOG PERFORMANCE STATUS: 2  Blood pressure 118/78, pulse 125, temperature 98.4 F (36.9 C), temperature source Oral, height 5\' 7"  (1.702 m), weight 170 lb 12.8 oz (77.474 kg).  LABORATORY DATA: Lab Results  Component Value Date   WBC 5.0 05/29/2012   HGB 9.3* 05/29/2012   HCT 28.5* 05/29/2012   MCV 92.2 05/29/2012   PLT 280 05/29/2012      Chemistry      Component Value Date/Time   NA 140 05/23/2012 0940   K 4.5 05/23/2012 0940   CL 104 05/23/2012 0940   CO2 27 05/23/2012 0940   BUN 13 05/23/2012 0940   CREATININE 0.80 05/23/2012 0940      Component Value Date/Time   CALCIUM 10.8* 05/23/2012 0940   ALKPHOS 81 05/23/2012 0940   AST 16 05/23/2012 0940   ALT 13 05/23/2012 0940   BILITOT 0.4 05/23/2012 0940       RADIOGRAPHIC STUDIES:  ASSESSMENT: 46 year old female with:  1.  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 The primary tumor revealed 2 foci measuring 1.3 and 0.8 cm.with 1/22 LN positive for metastatic disease. She has now completed 4 cycles of FEC 100. She overall tolerated this well.She began single agent Taxol on 05/23/2012. She received one dose so far and has had significant grade 2 toxicity with deterioration of her performance status to about a 3.  #2 patient At this time is very  concerned about continuation of chemotherapy as it is making her very sick and she is unable to do anything and does not have any quality of life.  PLAN:   #1 We had an extensive discussion today and after that was decided that we will hold her chemotherapy today. I will reevaluate her in one week's time and if she continues to experience the same toxicity effects then we will discontinue chemotherapy. Patient's tumor was ER positive PR positive HER-2/neu negative and thus we could potentially give her an antiestrogen therapy such as tamoxifen adjuvantly. We will have further discussions regarding this next week. Certainly the other option could be a different type of chemotherapeutic regimen. All questions were answered. The patient knows to call the clinic with any problems, questions or concerns. We can certainly see the patient much sooner if necessary.  I spent > counseling  the patient face to face. The total time spent in the appointment was 30 minutes.    Drue Second, MD Medical/Oncology [facility] [phone] (beeper) [phone] (Office)

## 2012-05-29 NOTE — Patient Instructions (Addendum)
Hold chemotherapy on 7/10.  Return on 7/17 for follow up.

## 2012-05-30 ENCOUNTER — Ambulatory Visit: Payer: BC Managed Care – PPO

## 2012-06-06 ENCOUNTER — Ambulatory Visit (HOSPITAL_BASED_OUTPATIENT_CLINIC_OR_DEPARTMENT_OTHER): Payer: BC Managed Care – PPO | Admitting: Oncology

## 2012-06-06 ENCOUNTER — Ambulatory Visit: Payer: BC Managed Care – PPO

## 2012-06-06 ENCOUNTER — Other Ambulatory Visit (HOSPITAL_BASED_OUTPATIENT_CLINIC_OR_DEPARTMENT_OTHER): Payer: BC Managed Care – PPO | Admitting: Lab

## 2012-06-06 ENCOUNTER — Encounter: Payer: Self-pay | Admitting: Oncology

## 2012-06-06 ENCOUNTER — Telehealth: Payer: Self-pay | Admitting: *Deleted

## 2012-06-06 VITALS — BP 104/68 | HR 81 | Temp 98.2°F | Ht 67.0 in | Wt 172.2 lb

## 2012-06-06 DIAGNOSIS — C50919 Malignant neoplasm of unspecified site of unspecified female breast: Secondary | ICD-10-CM

## 2012-06-06 DIAGNOSIS — C773 Secondary and unspecified malignant neoplasm of axilla and upper limb lymph nodes: Secondary | ICD-10-CM

## 2012-06-06 DIAGNOSIS — C50419 Malignant neoplasm of upper-outer quadrant of unspecified female breast: Secondary | ICD-10-CM

## 2012-06-06 LAB — CBC WITH DIFFERENTIAL/PLATELET
Basophils Absolute: 0.1 10*3/uL (ref 0.0–0.1)
Eosinophils Absolute: 0.2 10*3/uL (ref 0.0–0.5)
HCT: 30.5 % — ABNORMAL LOW (ref 34.8–46.6)
HGB: 10 g/dL — ABNORMAL LOW (ref 11.6–15.9)
LYMPH%: 18.5 % (ref 14.0–49.7)
MCV: 89.4 fL (ref 79.5–101.0)
MONO#: 0.4 10*3/uL (ref 0.1–0.9)
NEUT#: 3 10*3/uL (ref 1.5–6.5)
NEUT%: 65.4 % (ref 38.4–76.8)
Platelets: 212 10*3/uL (ref 145–400)
RBC: 3.41 10*6/uL — ABNORMAL LOW (ref 3.70–5.45)
WBC: 4.6 10*3/uL (ref 3.9–10.3)
nRBC: 0 % (ref 0–0)

## 2012-06-06 NOTE — Telephone Encounter (Signed)
Per orders cancelled all labs and chemo treatment gave patient appointment with dr..squire and three months with dr.khan

## 2012-06-06 NOTE — Patient Instructions (Addendum)
1. Proceed with radiation.  2. I will see you back in 3 months

## 2012-06-06 NOTE — Progress Notes (Signed)
OFFICE PROGRESS NOTE  CC  Alisha Freud, DO [phone] [facility] [address] [place] [address] Dr. Emelia Chen Dr. 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 Patient is now status post left breast lumpectomy with left axillary lymph node dissection performed on 02/10/2012  PRIOR THERAPY:  #1 patient underwent a left breast lumpectomy that revealed 2 foci of invasive ductal carcinoma measuring 1.3 cm and 0.8 cm grade 2 with adjacent high-grade ductal carcinoma in situ with no evidence of angiolymphatic invasion. The tumor was estrogen receptor +97% progesterone receptor +100% HER-2/neu negative proliferation marker 48% second focus was ER +96% PR +99% HER-2/neu negative with a proliferation marker 74%. 1/22 lymph nodes were examined all of which were positive for metastatic disease  .#2 Patient is now status post 4 cycles of FEC 100 given between 03/23/2012 2 05/10/2012. She completed all of her therapy and tolerated it reasonably well.  #3 she will now begin single agent weekly Taxol at 80 mg per meter squared x12 weeks starting 05/23/2012.  CURRENT THERAPY: Referred to radiation  INTERVAL HISTORY: Alisha Chen 46 y.o. female returns for Followup visit.Patient looks much better since being off of chemotherapy for almost 2 weeks now. She finally has had resolution of dryness of the eyes fatigue nausea or vomiting she still is weak she is still tired but overall her performance status has significantly improved. Remainder of the review of systems is unremarkable.  MEDICAL HISTORY: Past Medical History  Diagnosis Date  . Back pain   . Hx: UTI (urinary tract infection)   . Breast cancer     ALLERGIES:  is allergic to shellfish-derived products; clarithromycin; ondansetron;  and penicillins.  MEDICATIONS:  Current Outpatient Prescriptions  Medication Sig Dispense Refill  . dexamethasone (DECADRON) 4 MG tablet Take 2 tablets by mouth daily starting the day after chemotherapy for 2 days. Take with food.  30 tablet  1  . ferrous sulfate 325 (65 FE) MG EC tablet Take 325 mg by mouth 3 (three) times daily with meals.      . lidocaine-prilocaine (EMLA) cream Apply topically as needed. Apply to port area prior to chemotherapy  30 g  3  . LORazepam (ATIVAN) 0.5 MG tablet Take 1 tablet (0.5 mg total) by mouth every 6 (six) hours as needed (Nausea or vomiting).  30 tablet  0  . nitrofurantoin (MACRODANTIN) 100 MG capsule Take 100 mg by mouth 2 (two) times daily.      . prochlorperazine (COMPAZINE) 10 MG tablet Take 1 tablet (10 mg total) by mouth every 6 (six) hours as needed (Nausea or vomiting).  30 tablet  1  . prochlorperazine (COMPAZINE) 25 MG suppository Place 1 suppository (25 mg total) rectally every 12 (twelve) hours as needed for nausea.  12 suppository  3    SURGICAL HISTORY:  Past Surgical History  Procedure Date  . Spine surgery 01/2011  . Foot surgery     multiple  . Back surgery     LUMBAR DECOMPRESSION  . Portacath placement 02/10/2012    Procedure: INSERTION PORT-A-CATH;  Surgeon: Alisha Loron, MD;  Location: [facility];  Service: General;  Laterality: Right;  . Breast surgery 2013    left breast lumpectomy, left alnd    REVIEW OF SYSTEMS:  Pertinent items are  noted in HPI.   PHYSICAL EXAMINATION: General appearance: alert, cooperative and appears stated age Lymph nodes: Cervical, supraclavicular, and axillary nodes normal. Resp: clear to auscultation bilaterally Back: symmetric, no curvature. ROM normal. No CVA tenderness. Cardio: regular rate and rhythm, S1, S2 normal, no murmur, click, rub or gallop GI: soft, non-tender; bowel sounds normal; no masses,  no organomegaly Extremities: extremities normal, atraumatic, no  cyanosis or edema Neurologic: Grossly normal  ECOG PERFORMANCE STATUS: 2  Blood pressure 104/68, pulse 81, temperature 98.2 F (36.8 C), temperature source Oral, height 5\' 7"  (1.702 m), weight 172 lb 3.2 oz (78.109 kg).  LABORATORY DATA: Lab Results  Component Value Date   WBC 4.6 06/06/2012   HGB 10.0* 06/06/2012   HCT 30.5* 06/06/2012   MCV 89.4 06/06/2012   PLT 212 06/06/2012      Chemistry      Component Value Date/Time   NA 140 05/29/2012 1501   K 4.4 05/29/2012 1501   CL 105 05/29/2012 1501   CO2 28 05/29/2012 1501   BUN 13 05/29/2012 1501   CREATININE 0.84 05/29/2012 1501      Component Value Date/Time   CALCIUM 10.8* 05/29/2012 1501   ALKPHOS 77 05/29/2012 1501   AST 18 05/29/2012 1501   ALT 12 05/29/2012 1501   BILITOT 0.2* 05/29/2012 1501       RADIOGRAPHIC STUDIES:  ASSESSMENT: 46 year old female with:  1.  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 The primary tumor revealed 2 foci measuring 1.3 and 0.8 cm.with 1/22 LN positive for metastatic disease. She has now completed 4 cycles of FEC 100. She overall tolerated this well.She began single agent Taxol on 05/23/2012. She received one dose so far and has had significant grade 2 toxicity with deterioration of her performance status to about a 3.  #2 patient Patient is declining any further chemotherapy. We had an extensive discussion using adjuvant on line about her risk of relapse. She understands the risks and benefits of discontinuing chemotherapy. However due to the side effects that she is experiencing she has decided that she is willing to take the adjuvant hormonal therapy but will not take chemotherapy any further.   PLAN:  #1 patient will now be referred to radiation oncology for post lumpectomy radiation therapy.  #2 once patient completes her radiation we will then start her on tamoxifen 20 mg daily With a plan of treating her with 10 years of  therapy.  #3 I will plan on seeing her back in about 2-3 months time.  I spent > counseling the patient face to face. The total time spent in the appointment was 30 minutes.    Drue Second, MD Medical/Oncology [facility] [phone] (beeper) [phone] (Office)

## 2012-06-07 ENCOUNTER — Encounter: Payer: Self-pay | Admitting: Radiation Oncology

## 2012-06-08 ENCOUNTER — Ambulatory Visit
Admission: RE | Admit: 2012-06-08 | Payer: BC Managed Care – PPO | Source: Ambulatory Visit | Admitting: Radiation Oncology

## 2012-06-08 ENCOUNTER — Ambulatory Visit: Payer: BC Managed Care – PPO

## 2012-06-08 HISTORY — DX: Personal history of antineoplastic chemotherapy: Z92.21

## 2012-06-13 ENCOUNTER — Ambulatory Visit: Payer: BC Managed Care – PPO

## 2012-06-13 ENCOUNTER — Telehealth: Payer: Self-pay | Admitting: Medical Oncology

## 2012-06-13 ENCOUNTER — Other Ambulatory Visit: Payer: BC Managed Care – PPO | Admitting: Lab

## 2012-06-13 ENCOUNTER — Ambulatory Visit: Payer: BC Managed Care – PPO | Admitting: Oncology

## 2012-06-13 NOTE — Telephone Encounter (Signed)
Patient LMOVM " I need Dr. Welton Flakes to send some medical records to my secondary insurance company about why I needed a PET scan.  I have some bills that I am behind on and my secondary insurance company says they may be able to help me but I need these records."  Spoke with patient directing her to the medical records department. " I have already talked to them and they were no help at all."  Took information from patient: fax number, records request.  Spoke with Elmarie Shiley in medical records about situation and relayed information.

## 2012-06-20 ENCOUNTER — Ambulatory Visit: Payer: BC Managed Care – PPO

## 2012-06-20 ENCOUNTER — Other Ambulatory Visit: Payer: BC Managed Care – PPO | Admitting: Lab

## 2012-06-20 ENCOUNTER — Ambulatory Visit: Payer: BC Managed Care – PPO | Admitting: Oncology

## 2012-06-26 ENCOUNTER — Encounter: Payer: Self-pay | Admitting: *Deleted

## 2012-06-26 ENCOUNTER — Ambulatory Visit: Payer: BC Managed Care – PPO | Admitting: Oncology

## 2012-06-26 ENCOUNTER — Other Ambulatory Visit: Payer: BC Managed Care – PPO | Admitting: Lab

## 2012-06-27 ENCOUNTER — Ambulatory Visit: Payer: BC Managed Care – PPO | Attending: Family Medicine

## 2012-06-27 ENCOUNTER — Other Ambulatory Visit: Payer: Self-pay | Admitting: Radiation Oncology

## 2012-06-27 ENCOUNTER — Ambulatory Visit: Payer: BC Managed Care – PPO

## 2012-06-27 ENCOUNTER — Ambulatory Visit
Admission: RE | Admit: 2012-06-27 | Discharge: 2012-06-27 | Disposition: A | Discharge status: Home / Self Care | Payer: BC Managed Care – PPO | Source: Ambulatory Visit | Attending: Radiation Oncology | Admitting: Radiation Oncology

## 2012-06-27 ENCOUNTER — Encounter: Payer: Self-pay | Admitting: Radiation Oncology

## 2012-06-27 VITALS — BP 115/79 | HR 76 | Temp 98.4°F | Resp 20 | Ht 67.0 in | Wt 176.9 lb

## 2012-06-27 DIAGNOSIS — C50419 Malignant neoplasm of upper-outer quadrant of unspecified female breast: Secondary | ICD-10-CM

## 2012-06-27 DIAGNOSIS — C50919 Malignant neoplasm of unspecified site of unspecified female breast: Secondary | ICD-10-CM | POA: Insufficient documentation

## 2012-06-27 DIAGNOSIS — Z510 Encounter for antineoplastic radiation therapy: Secondary | ICD-10-CM | POA: Insufficient documentation

## 2012-06-27 NOTE — Progress Notes (Signed)
Radiation Oncology         [phone] ________________________________  Name: Alisha Chen MRN: [id number]  Date: 06/27/2012  DOB: 1966-06-17  Follow-Up Visit Note  Diagnosis:  T1 CN1M0 invasive ductal carcinoma, multifocal, grade 2, with adjacent high-grade ductal carcinoma in situ, ER/PR positive HER-2/neu negative. One out of 22 lymph nodes positive. This lymph node was mostly replaced by tumor.   Narrative:  The patient returns today for routine follow-up.  She underwent lumpectomy and axillary lymph node dissection with pathology as described above. She did have a close margin to DCIS which was 1 mm. The patient underwent chemotherapy in the form of   4 cycles of FEC followed by an incomplete course of Taxol. She did not tolerate Taxol well. The patient reports that she had gastrointestinal side effects including bad hemorrhoids. She also had syncopal episodes and difficulty walking due to dizziness.      Her last cycle of chemotherapy was on 05/23/2012. Eventually, she will start tamoxifen.  The patient reports that she is concerned about side effects. She is willing to undergo radiotherapy but would like to minimize side effects as much as possible. She is quite worn out from systemic therapy.  She denies lymphedema..  ALLERGIES:  is allergic to shellfish-derived products; clarithromycin; ondansetron; and penicillins.  Meds: Current Outpatient Prescriptions  Medication Sig Dispense Refill  . lidocaine-prilocaine (EMLA) cream Apply topically as needed. Apply to port area prior to chemotherapy  30 g  3  . nitrofurantoin (MACRODANTIN) 100 MG capsule Take 100 mg by mouth 2 (two) times daily.        Physical Findings: The patient is in no acute distress. Patient is alert and oriented.  height is 5\' 7"  (1.702 m) and weight is 176 lb 14.4 oz (80.241 kg). Her oral temperature is 98.4 F (36.9 C). Her blood pressure is 115/79 and her pulse is 76. Her respiration is 20. .    Breast exam is notable for a well-healed lumpectomy scar in the upper outer quadrant of the left breast. Her breasts are large and somewhat pendulous. No palpable lymphadenopathy in either axillary region. No palpable lesions of concern in either breast. No palpable cervical or supraclavicular lymphadenopathy. No lymphedema in her arms   Lab Findings: Lab Results  Component Value Date   WBC 4.6 06/06/2012   HGB 10.0* 06/06/2012   HCT 30.5* 06/06/2012   MCV 89.4 06/06/2012   PLT 212 06/06/2012     Radiographic Findings: No results found.  Impression/Plan:   It was a pleasure meeting the patient again today. Pathology after surgery revealed T1 C. N1  Disease. She had one of 22 axillary lymph nodes positive. She is recovering from systemic therapy which was quite difficult for her. She is concerned about side effects from radiotherapy.  We discussed the risks, benefits, and side effects of radiotherapy. We discussed that radiation would take approximately 6 weeks to complete.  We spoke about acute effects including skin irritation and fatigue as well as much less common late effects including lung and heart irritation. We spoke about the latest technology that is used to minimize the risk of late effects for breast cancer patients undergoing radiotherapy.   Based on her anatomy, she could benefit from treatment in the prone position. This is due to large pendulous breast; treatment in the prone position would decrease her skin folds and possibly decrease skin toxicity. She would like to try this out as she is concerned that skin irritation  would effect her quality of life and ability to continue working. I told her that if she is treated in the prone position, we would not treat her supraclavicular fossa. I did acknowledge that treating the supraclavicular fossa is not necessary, but could be considered in light of her single positive axillary node. We spoke about other side effects of treating the  supraclavicular fossa including increased risk of lymphedema of the arm. I spoke about potential benefits of treating the supraclavicular fossa. Ultimately, after discussion, she would like to try treatment in the prone position and decline on supraclavicular fossa treatment.   No guarantees of treatment were given. The patient is enthusiastic about proceeding with treatment. I look forward to participating in the patient's care. A consent form was signed and placed in her chart. _____________________________________   Lonie Peak, MD

## 2012-06-27 NOTE — Progress Notes (Signed)
Pt states she occasionally has left axillary discomfort.

## 2012-06-27 NOTE — Progress Notes (Signed)
Please see the Nurse Progress Note in the MD Initial Consult Encounter for this patient. 

## 2012-06-28 NOTE — Addendum Note (Signed)
Encounter addended by: Mary Clare Chesson, RN on: 06/28/2012 10:50 AM<BR>     Documentation filed: Charges VN

## 2012-07-02 ENCOUNTER — Ambulatory Visit
Admission: RE | Admit: 2012-07-02 | Discharge: 2012-07-02 | Disposition: A | Discharge status: Home / Self Care | Payer: BC Managed Care – PPO | Source: Ambulatory Visit | Attending: Radiation Oncology | Admitting: Radiation Oncology

## 2012-07-02 DIAGNOSIS — C50419 Malignant neoplasm of upper-outer quadrant of unspecified female breast: Secondary | ICD-10-CM

## 2012-07-02 NOTE — Progress Notes (Signed)
Simulation treatment planning note   The patient is status post lumpectomy. She will receive whole breast radiotherapy. Adhesive wiring was placed over the patient's lumpectomy scar. The patient was laid in the prone position on the treatment table with her arms over her head. I verified that her left breast was hanging away from her chest wall appropriately. High-resolution CT axial imaging was obtained of the patient's chest. An isocenter was placed in her left breast. Skin markings were made and she tolerated the procedure well without any complications.  Treatment planning note: the patient will be treated with opposed tangential fields using MLCs for custom blocks. I plan to prescribe 50 Gray in 25 fractions to the whole left breast followed by boost to the lumpectomy cavity of 10 Gray in 5 fractions (probably a 3 field photon boost). 3-D conformal radiotherapy will be used. I am requesting a DVH of the patient's lungs, heart and lumpectomy cavity.

## 2012-07-04 ENCOUNTER — Ambulatory Visit: Payer: BC Managed Care – PPO | Admitting: Oncology

## 2012-07-04 ENCOUNTER — Ambulatory Visit: Payer: BC Managed Care – PPO

## 2012-07-04 ENCOUNTER — Other Ambulatory Visit: Payer: BC Managed Care – PPO | Admitting: Lab

## 2012-07-09 ENCOUNTER — Encounter: Payer: Self-pay | Admitting: Radiation Oncology

## 2012-07-09 ENCOUNTER — Ambulatory Visit
Admission: RE | Admit: 2012-07-09 | Discharge: 2012-07-09 | Disposition: A | Discharge status: Home / Self Care | Payer: BC Managed Care – PPO | Source: Ambulatory Visit | Attending: Radiation Oncology | Admitting: Radiation Oncology

## 2012-07-09 NOTE — Progress Notes (Signed)
Simulation verification note: The patient underwent similar to verification for treatment to her left breast in a prone position. While the isocenter appears to be in good position,her tangential fields appear to be too deep. I requested that her ports be repeated. Otherwise, the multileaf collimators contoured the intended treatment volume.

## 2012-07-10 ENCOUNTER — Ambulatory Visit
Admission: RE | Admit: 2012-07-10 | Discharge: 2012-07-10 | Disposition: A | Discharge status: Home / Self Care | Payer: BC Managed Care – PPO | Source: Ambulatory Visit | Attending: Radiation Oncology | Admitting: Radiation Oncology

## 2012-07-11 ENCOUNTER — Ambulatory Visit: Payer: BC Managed Care – PPO

## 2012-07-11 ENCOUNTER — Other Ambulatory Visit: Payer: BC Managed Care – PPO | Admitting: Lab

## 2012-07-11 ENCOUNTER — Ambulatory Visit
Admission: RE | Admit: 2012-07-11 | Discharge: 2012-07-11 | Disposition: A | Discharge status: Home / Self Care | Payer: BC Managed Care – PPO | Source: Ambulatory Visit | Attending: Radiation Oncology | Admitting: Radiation Oncology

## 2012-07-11 ENCOUNTER — Ambulatory Visit: Payer: BC Managed Care – PPO | Admitting: Oncology

## 2012-07-12 ENCOUNTER — Ambulatory Visit
Admission: RE | Admit: 2012-07-12 | Discharge: 2012-07-12 | Disposition: A | Discharge status: Home / Self Care | Payer: BC Managed Care – PPO | Source: Ambulatory Visit | Attending: Radiation Oncology | Admitting: Radiation Oncology

## 2012-07-13 ENCOUNTER — Ambulatory Visit
Admission: RE | Admit: 2012-07-13 | Discharge: 2012-07-13 | Disposition: A | Discharge status: Home / Self Care | Payer: BC Managed Care – PPO | Source: Ambulatory Visit | Attending: Radiation Oncology | Admitting: Radiation Oncology

## 2012-07-13 DIAGNOSIS — C50919 Malignant neoplasm of unspecified site of unspecified female breast: Secondary | ICD-10-CM

## 2012-07-13 MED ORDER — RADIAPLEXRX EX GEL
Freq: Once | CUTANEOUS | Status: AC
  Administered 2012-07-13: 1 via TOPICAL

## 2012-07-13 MED ORDER — ALRA NON-METALLIC DEODORANT (RAD-ONC)
1.0000 "application " | Freq: Once | TOPICAL | Status: AC
  Administered 2012-07-13: 1 via TOPICAL

## 2012-07-13 NOTE — Progress Notes (Signed)
Post simulation Education including skin care management, fatigue management, protein diet to increase normal tissue cell repair, and pain management.  Given Radiaplex  With instructions to use in the am, after tx. In the pm and at bedtime and given Alra deodorant with instructions to use in the am since her treatment is at the end of the day.  Cautioned to do a test patch x 4 days with the alra to ensure skin tolerance and to monitor for any rash.  She stated understanding of the information given and how to use skin care product and deodorant.  Used teach back method.  Encouraged to ask questions.  Dr. Morrie Sheldon Every Monday.  Given Radiation Therapy and You Booklet.

## 2012-07-16 ENCOUNTER — Ambulatory Visit
Admission: RE | Admit: 2012-07-16 | Discharge: 2012-07-16 | Disposition: A | Discharge status: Home / Self Care | Payer: BC Managed Care – PPO | Source: Ambulatory Visit | Attending: Radiation Oncology | Admitting: Radiation Oncology

## 2012-07-16 ENCOUNTER — Encounter: Payer: Self-pay | Admitting: Radiation Oncology

## 2012-07-16 VITALS — BP 120/86 | HR 76 | Temp 98.1°F | Wt 176.0 lb

## 2012-07-16 DIAGNOSIS — C50419 Malignant neoplasm of upper-outer quadrant of unspecified female breast: Secondary | ICD-10-CM

## 2012-07-16 NOTE — Progress Notes (Signed)
Weekly Management Note:  Site:L Breast Current Dose:  1000  cGy Projected Dose: 6000  cGy  Narrative: The patient is seen today for routine under treatment assessment. CBCT/MVCT images/port films were reviewed. The chart was reviewed.   She has Radioplex gel to use when necessary. No complaints today.  Physical Examination:  Filed Vitals:   07/16/12 1703  BP: 120/86  Pulse: 76  Temp: 98.1 F (36.7 C)  .  Weight: 176 lb (79.833 kg). No skin changes.  Impression: Tolerating radiation therapy well.  Plan: Continue radiation therapy as planned.

## 2012-07-16 NOTE — Progress Notes (Signed)
5/25 fractions to left breast.  No voiced complaints.  Reviewed skin care during radiation therapy to include when to apply cream and reviewed what skin care changes may occur.  She stated understanding.

## 2012-07-17 ENCOUNTER — Ambulatory Visit
Admission: RE | Admit: 2012-07-17 | Discharge: 2012-07-17 | Disposition: A | Discharge status: Home / Self Care | Payer: BC Managed Care – PPO | Source: Ambulatory Visit | Attending: Radiation Oncology | Admitting: Radiation Oncology

## 2012-07-18 ENCOUNTER — Ambulatory Visit
Admission: RE | Admit: 2012-07-18 | Discharge: 2012-07-18 | Disposition: A | Discharge status: Home / Self Care | Payer: BC Managed Care – PPO | Source: Ambulatory Visit | Attending: Radiation Oncology | Admitting: Radiation Oncology

## 2012-07-18 ENCOUNTER — Other Ambulatory Visit: Payer: BC Managed Care – PPO | Admitting: Lab

## 2012-07-18 ENCOUNTER — Ambulatory Visit: Payer: BC Managed Care – PPO | Admitting: Oncology

## 2012-07-18 ENCOUNTER — Ambulatory Visit: Payer: BC Managed Care – PPO

## 2012-07-18 DIAGNOSIS — C50419 Malignant neoplasm of upper-outer quadrant of unspecified female breast: Secondary | ICD-10-CM

## 2012-07-18 NOTE — Progress Notes (Signed)
   Weekly Management Note Current Dose:   14Gy  Projected Dose:  60 Gy   Narrative:  The patient presents for routine under treatment assessment.  CBCT/MVCT images/Port film x-rays were reviewed.  The chart was checked. She is doing well. Still fatigued from chemo. Using Radiaplex.  Physical Findings:  vitals were not taken for this visit. Very minimal skin changes thus far.  Impression:  The patient is tolerating radiotherapy.  Plan:  Continue radiotherapy as planned.  ________________________________   Lonie Peak, M.D.

## 2012-07-19 ENCOUNTER — Ambulatory Visit
Admission: RE | Admit: 2012-07-19 | Discharge: 2012-07-19 | Disposition: A | Discharge status: Home / Self Care | Payer: BC Managed Care – PPO | Source: Ambulatory Visit | Attending: Radiation Oncology | Admitting: Radiation Oncology

## 2012-07-20 ENCOUNTER — Encounter: Payer: BC Managed Care – PPO | Admitting: Radiation Oncology

## 2012-07-20 ENCOUNTER — Ambulatory Visit: Payer: BC Managed Care – PPO

## 2012-07-24 ENCOUNTER — Ambulatory Visit
Admission: RE | Admit: 2012-07-24 | Discharge: 2012-07-24 | Disposition: A | Discharge status: Home / Self Care | Payer: BC Managed Care – PPO | Source: Ambulatory Visit | Attending: Radiation Oncology | Admitting: Radiation Oncology

## 2012-07-25 ENCOUNTER — Ambulatory Visit: Payer: BC Managed Care – PPO | Admitting: Oncology

## 2012-07-25 ENCOUNTER — Ambulatory Visit
Admission: RE | Admit: 2012-07-25 | Discharge: 2012-07-25 | Disposition: A | Discharge status: Home / Self Care | Payer: BC Managed Care – PPO | Source: Ambulatory Visit | Attending: Radiation Oncology | Admitting: Radiation Oncology

## 2012-07-25 ENCOUNTER — Ambulatory Visit: Payer: BC Managed Care – PPO

## 2012-07-25 ENCOUNTER — Encounter: Payer: Self-pay | Admitting: Radiation Oncology

## 2012-07-25 ENCOUNTER — Other Ambulatory Visit: Payer: BC Managed Care – PPO | Admitting: Lab

## 2012-07-25 VITALS — BP 114/78 | HR 69 | Resp 18 | Wt 177.7 lb

## 2012-07-25 DIAGNOSIS — C50419 Malignant neoplasm of upper-outer quadrant of unspecified female breast: Secondary | ICD-10-CM

## 2012-07-25 NOTE — Progress Notes (Signed)
Patient presents to the clinic today for PUT with Dr. Basilio Cairo. Patient is alert and oriented to person, place, and time. No distress noted. Steady gait noted. Pleasant affect noted. Patient denies pain at this time. Patient denies nippled discharge. Patient reports hyperpigmentation of left/treated breast without desquamation. Patient reports using Radiaplex as directed. Fatigue noted from frequent yawns. Patient reports hot flashes are keeping her awake at night. Patient has no other complaints at this time. Reported all findings to Dr. Basilio Cairo.

## 2012-07-25 NOTE — Progress Notes (Signed)
   Weekly Management Note Current Dose:  20 Gy  Projected Dose:  60Gy   Narrative:  The patient presents for routine under treatment assessment.  CBCT/MVCT images/Port film x-rays were reviewed.  The chart was checked. Using Radiaplex 2-3 daily. Tired.  Physical Findings:  weight is 177 lb 11.2 oz (80.604 kg). Her blood pressure is 114/78 and her pulse is 69. Her respiration is 18.  Left breast: early hyperpigmentation.  Impression:  The patient is tolerating radiotherapy.  Plan:  Continue radiotherapy as planned.  ________________________________   Lonie Peak, M.D.

## 2012-07-26 ENCOUNTER — Ambulatory Visit
Admission: RE | Admit: 2012-07-26 | Discharge: 2012-07-26 | Disposition: A | Discharge status: Home / Self Care | Payer: BC Managed Care – PPO | Source: Ambulatory Visit | Attending: Radiation Oncology | Admitting: Radiation Oncology

## 2012-07-27 ENCOUNTER — Ambulatory Visit
Admission: RE | Admit: 2012-07-27 | Discharge: 2012-07-27 | Disposition: A | Discharge status: Home / Self Care | Payer: BC Managed Care – PPO | Source: Ambulatory Visit | Attending: Radiation Oncology | Admitting: Radiation Oncology

## 2012-07-30 ENCOUNTER — Ambulatory Visit
Admission: RE | Admit: 2012-07-30 | Discharge: 2012-07-30 | Disposition: A | Discharge status: Home / Self Care | Payer: BC Managed Care – PPO | Source: Ambulatory Visit | Attending: Radiation Oncology | Admitting: Radiation Oncology

## 2012-07-30 ENCOUNTER — Encounter: Payer: Self-pay | Admitting: Radiation Oncology

## 2012-07-30 VITALS — BP 116/58 | HR 71 | Temp 98.1°F | Resp 20 | Wt 179.2 lb

## 2012-07-30 DIAGNOSIS — C50419 Malignant neoplasm of upper-outer quadrant of unspecified female breast: Secondary | ICD-10-CM

## 2012-07-30 NOTE — Progress Notes (Signed)
Patient weekly rad tx left  breast 13/30, patient alert,oriented x3, slight tanning on breast, skin intact,using radiaplex gel bid, sometimes 3x day, occasional tingling throughout left breast, no c/o pain 4:27 PM

## 2012-07-30 NOTE — Progress Notes (Signed)
   Weekly Management Note Current Dose:  26 Gy  Projected Dose: 60 Gy   Narrative:  The patient presents for routine under treatment assessment.  CBCT/MVCT images/Port film x-rays were reviewed.  The chart was checked. Doing well. Fatigue, but exercising regularly. Still working full-time at the [employer].  Physical Findings:  weight is 179 lb 3.2 oz (81.285 kg). Her oral temperature is 98.1 F (36.7 C). Her blood pressure is 116/58 and her pulse is 71. Her respiration is 20.  skin is intact and slightly hyperpigmented over the left breast.  Impression:  The patient is tolerating radiotherapy.  Plan:  Continue radiotherapy as planned. Continue radiaplex.  ________________________________   Lonie Peak, M.D.

## 2012-07-31 ENCOUNTER — Ambulatory Visit
Admission: RE | Admit: 2012-07-31 | Discharge: 2012-07-31 | Disposition: A | Discharge status: Home / Self Care | Payer: BC Managed Care – PPO | Source: Ambulatory Visit | Attending: Radiation Oncology | Admitting: Radiation Oncology

## 2012-08-01 ENCOUNTER — Ambulatory Visit
Admission: RE | Admit: 2012-08-01 | Discharge: 2012-08-01 | Disposition: A | Discharge status: Home / Self Care | Payer: BC Managed Care – PPO | Source: Ambulatory Visit | Attending: Radiation Oncology | Admitting: Radiation Oncology

## 2012-08-01 ENCOUNTER — Ambulatory Visit: Payer: BC Managed Care – PPO

## 2012-08-01 ENCOUNTER — Other Ambulatory Visit: Payer: BC Managed Care – PPO | Admitting: Lab

## 2012-08-01 ENCOUNTER — Ambulatory Visit: Payer: BC Managed Care – PPO | Admitting: Oncology

## 2012-08-02 ENCOUNTER — Ambulatory Visit
Admission: RE | Admit: 2012-08-02 | Discharge: 2012-08-02 | Disposition: A | Discharge status: Home / Self Care | Payer: BC Managed Care – PPO | Source: Ambulatory Visit | Attending: Radiation Oncology | Admitting: Radiation Oncology

## 2012-08-03 ENCOUNTER — Ambulatory Visit
Admission: RE | Admit: 2012-08-03 | Discharge: 2012-08-03 | Disposition: A | Discharge status: Home / Self Care | Payer: BC Managed Care – PPO | Source: Ambulatory Visit | Attending: Radiation Oncology | Admitting: Radiation Oncology

## 2012-08-06 ENCOUNTER — Encounter: Payer: Self-pay | Admitting: Radiation Oncology

## 2012-08-06 ENCOUNTER — Ambulatory Visit
Admission: RE | Admit: 2012-08-06 | Discharge: 2012-08-06 | Disposition: A | Discharge status: Home / Self Care | Payer: BC Managed Care – PPO | Source: Ambulatory Visit | Attending: Radiation Oncology | Admitting: Radiation Oncology

## 2012-08-06 VITALS — BP 112/72 | HR 62 | Resp 18 | Wt 178.0 lb

## 2012-08-06 DIAGNOSIS — C50419 Malignant neoplasm of upper-outer quadrant of unspecified female breast: Secondary | ICD-10-CM

## 2012-08-06 MED ORDER — BIAFINE EX EMUL
Freq: Every day | CUTANEOUS | Status: DC
  Administered 2012-08-06: 17:00:00 via TOPICAL

## 2012-08-06 NOTE — Progress Notes (Signed)
Received patient in the clinic today for PUT with Dr. Basilio Cairo. Patient is alert and oriented to person, place, and time. No distress noted. Steady gait noted. Pleasant affect noted. Patient denies pain at this time. However, patient reports that her left breast does sting and itch frequently. Patient reports hyperpigmentation without desquamation. Patient reports that she has been using her Radiaplex gel bid as directed. Patient denies nipple discharge. Patient reports only an occasional brief sharp shooting pain in her left breast. Patient reports increased fatigue. Reported all findings to Dr. Basilio Cairo.

## 2012-08-06 NOTE — Progress Notes (Signed)
   Weekly Management Note Current Dose:   36Gy  Projected Dose:  60Gy   Narrative:  The patient presents for routine under treatment assessment.  CBCT/MVCT images/Port film x-rays were reviewed.  The chart was checked. Doing relatively well but quite tired. She is still teaching full time. She reports soreness and itching throughout her breast, especially at the inframammary fold and the axilla  Physical Findings:  weight is 178 lb (80.74 kg). Her blood pressure is 112/72 and her pulse is 62. Her respiration is 18.  diffuse hyperpigmentation throughout the left breast. This is concentrated in the inframammary fold and the left axilla. No desquamation.  Impression:  The patient is tolerating radiotherapy.  Plan:  Continue radiotherapy as planned. We will change from radiaplex to Biafine. The patient was given nonadherent pads for her inframammary fold. I told her she can also try 1% hydrocortisone cream in the areas that are itching.  ________________________________   Lonie Peak, M.D.

## 2012-08-07 ENCOUNTER — Ambulatory Visit: Payer: BC Managed Care – PPO

## 2012-08-08 ENCOUNTER — Ambulatory Visit: Payer: BC Managed Care – PPO | Admitting: Oncology

## 2012-08-08 ENCOUNTER — Ambulatory Visit: Payer: BC Managed Care – PPO

## 2012-08-08 ENCOUNTER — Other Ambulatory Visit: Payer: BC Managed Care – PPO | Admitting: Lab

## 2012-08-08 ENCOUNTER — Ambulatory Visit
Admission: RE | Admit: 2012-08-08 | Discharge: 2012-08-08 | Disposition: A | Discharge status: Home / Self Care | Payer: BC Managed Care – PPO | Source: Ambulatory Visit | Attending: Radiation Oncology | Admitting: Radiation Oncology

## 2012-08-09 ENCOUNTER — Ambulatory Visit
Admission: RE | Admit: 2012-08-09 | Discharge: 2012-08-09 | Disposition: A | Discharge status: Home / Self Care | Payer: BC Managed Care – PPO | Source: Ambulatory Visit | Attending: Radiation Oncology | Admitting: Radiation Oncology

## 2012-08-10 ENCOUNTER — Ambulatory Visit
Admission: RE | Admit: 2012-08-10 | Discharge: 2012-08-10 | Disposition: A | Discharge status: Home / Self Care | Payer: BC Managed Care – PPO | Source: Ambulatory Visit | Attending: Radiation Oncology | Admitting: Radiation Oncology

## 2012-08-10 ENCOUNTER — Encounter: Payer: Self-pay | Admitting: Radiation Oncology

## 2012-08-13 ENCOUNTER — Ambulatory Visit
Admission: RE | Admit: 2012-08-13 | Discharge: 2012-08-13 | Disposition: A | Discharge status: Home / Self Care | Payer: BC Managed Care – PPO | Source: Ambulatory Visit | Attending: Radiation Oncology | Admitting: Radiation Oncology

## 2012-08-14 ENCOUNTER — Ambulatory Visit
Admission: RE | Admit: 2012-08-14 | Discharge: 2012-08-14 | Disposition: A | Discharge status: Home / Self Care | Payer: BC Managed Care – PPO | Source: Ambulatory Visit | Attending: Radiation Oncology | Admitting: Radiation Oncology

## 2012-08-15 ENCOUNTER — Ambulatory Visit: Payer: BC Managed Care – PPO

## 2012-08-15 ENCOUNTER — Other Ambulatory Visit: Payer: BC Managed Care – PPO | Admitting: Lab

## 2012-08-15 ENCOUNTER — Encounter: Payer: Self-pay | Admitting: Radiation Oncology

## 2012-08-15 ENCOUNTER — Ambulatory Visit
Admission: RE | Admit: 2012-08-15 | Discharge: 2012-08-15 | Disposition: A | Discharge status: Home / Self Care | Payer: BC Managed Care – PPO | Source: Ambulatory Visit | Attending: Radiation Oncology | Admitting: Radiation Oncology

## 2012-08-15 ENCOUNTER — Ambulatory Visit: Payer: BC Managed Care – PPO | Admitting: Oncology

## 2012-08-15 VITALS — BP 113/78 | HR 67 | Resp 18 | Wt 181.0 lb

## 2012-08-15 DIAGNOSIS — C50419 Malignant neoplasm of upper-outer quadrant of unspecified female breast: Secondary | ICD-10-CM

## 2012-08-15 NOTE — Progress Notes (Signed)
Received patient in the clinic today unaccompanied for PUT with Dr. Basilio Cairo. Patient is alert and oriented to person, place, and time. No distress noted. Steady gait noted. Pleasant affect noted. Patient denies pain at this time. Patient reports hyperpigmentation without desquamation of left/treated breast. Patient reports that she continues to use Biafine cream on treatment area. Patient is very concerned about weight gain. 7/9 patient weighed in at 170 but, today 181. Patient reports that she remains active going to the gym and her appetite has not increased. Reported all findings to Dr. Basilio Cairo.

## 2012-08-15 NOTE — Progress Notes (Signed)
   Weekly Management Note Current Dose:  48 Gy  Projected Dose:  60Gy   Narrative:  The patient presents for routine under treatment assessment.  CBCT/MVCT images/Port film x-rays were reviewed.  The chart was checked. She is fatigued and also a little upset because she has gained about 10 pounds over the past 2 months. She is not eating very much and she is exercising regularly.  Physical Findings:  weight is 181 lb (82.101 kg). Her blood pressure is 113/78 and her pulse is 67. Her respiration is 18.  Tired appearing. Left breast is diffusely hyperpigmented/ erythematous. Skin is intact.  Impression:  The patient is tolerating radiotherapy.  Plan:  Continue radiotherapy as planned. We spoke about her weight gain. I told her her metabolism may have changed since completing chemotherapy. I suggested she talk to medical oncology about this further. I encouraged to continue exercising and told her there many benefits of exercise even if she does not lose weight. She'll continue Biafine over her skin.  ________________________________   Lonie Peak, M.D.

## 2012-08-16 ENCOUNTER — Ambulatory Visit
Admission: RE | Admit: 2012-08-16 | Discharge: 2012-08-16 | Disposition: A | Discharge status: Home / Self Care | Payer: BC Managed Care – PPO | Source: Ambulatory Visit | Attending: Radiation Oncology | Admitting: Radiation Oncology

## 2012-08-16 ENCOUNTER — Encounter: Payer: Self-pay | Admitting: Radiation Oncology

## 2012-08-16 NOTE — Progress Notes (Signed)
VERIFICATION SIMULATION NOTE  The patient was laid in the correct position on the treatment table for simulation verification. Portal imaging was obtained and I verified the fields and MLCs for her boost treatment to be accurate. The patient tolerated the procedure well.  -----------------------------------------------------  Rexanna Louthan, MD  

## 2012-08-17 ENCOUNTER — Ambulatory Visit
Admission: RE | Admit: 2012-08-17 | Discharge: 2012-08-17 | Disposition: A | Discharge status: Home / Self Care | Payer: BC Managed Care – PPO | Source: Ambulatory Visit | Attending: Radiation Oncology | Admitting: Radiation Oncology

## 2012-08-20 ENCOUNTER — Ambulatory Visit
Admission: RE | Admit: 2012-08-20 | Discharge: 2012-08-20 | Disposition: A | Discharge status: Home / Self Care | Payer: BC Managed Care – PPO | Source: Ambulatory Visit | Attending: Radiation Oncology | Admitting: Radiation Oncology

## 2012-08-20 VITALS — BP 119/81 | HR 78 | Temp 98.4°F | Wt 178.4 lb

## 2012-08-20 DIAGNOSIS — C50419 Malignant neoplasm of upper-outer quadrant of unspecified female breast: Secondary | ICD-10-CM

## 2012-08-20 NOTE — Progress Notes (Signed)
   Weekly Management Note Current Dose: 54  Gy  Projected Dose: 60 Gy   Narrative:  The patient presents for routine under treatment assessment.  CBCT/MVCT images/Port film x-rays were reviewed.  The chart was checked. Still tired but working full-time. She reports that her skin burns. Using Biafine  Physical Findings:  weight is 178 lb 6.4 oz (80.922 kg). Her temperature is 98.4 F (36.9 C). Her blood pressure is 119/81 and her pulse is 78.  left breast is diffusely hyperpigmented and erythematous. Dry desquamation at the inframammary fold.  Impression:  The patient is tolerating radiotherapy.  Plan:  Continue radiotherapy as planned. We will give the patient hydrogel pads to use as needed. Followup in one month. Patient was encouraged to call if she has any issues in the interim.  ________________________________   Lonie Peak, M.D.

## 2012-08-20 NOTE — Progress Notes (Addendum)
2/5 in boost field.  C/o tenderness on the lateral side of her left breast .  Marked hyperpigmentation, but moist desquamation in the inframmary fold has resolved.

## 2012-08-21 ENCOUNTER — Ambulatory Visit
Admission: RE | Admit: 2012-08-21 | Discharge: 2012-08-21 | Disposition: A | Discharge status: Home / Self Care | Payer: BC Managed Care – PPO | Source: Ambulatory Visit | Attending: Radiation Oncology | Admitting: Radiation Oncology

## 2012-08-22 ENCOUNTER — Ambulatory Visit
Admission: RE | Admit: 2012-08-22 | Discharge: 2012-08-22 | Disposition: A | Discharge status: Home / Self Care | Payer: BC Managed Care – PPO | Source: Ambulatory Visit | Attending: Radiation Oncology | Admitting: Radiation Oncology

## 2012-08-22 ENCOUNTER — Ambulatory Visit: Payer: BC Managed Care – PPO

## 2012-08-23 ENCOUNTER — Ambulatory Visit: Payer: BC Managed Care – PPO

## 2012-08-23 ENCOUNTER — Ambulatory Visit
Admission: RE | Admit: 2012-08-23 | Discharge: 2012-08-23 | Disposition: A | Discharge status: Home / Self Care | Payer: BC Managed Care – PPO | Source: Ambulatory Visit | Attending: Radiation Oncology | Admitting: Radiation Oncology

## 2012-08-23 ENCOUNTER — Encounter: Payer: Self-pay | Admitting: Radiation Oncology

## 2012-08-24 ENCOUNTER — Ambulatory Visit: Payer: BC Managed Care – PPO

## 2012-08-24 NOTE — Progress Notes (Signed)
  Radiation Oncology         ([phone]) [phone] ________________________________  Name: Alisha Chen MRN: [id number]  Date: 08/23/2012  DOB: 1965-12-01  End of Treatment Note  Diagnosis:   T1cN1M0 invasive ductal carcinoma, multifocal, grade 2, with adjacent high-grade ductal carcinoma in situ, ER/PR positive HER-2/neu negative.    Indication for treatment: curative      Radiation treatment dates:   07/10/2012-08/23/2012  Site/dose:    1) Left Breast Prone / 50 Gy in 25 fractions 2) Left breast boost prone / 10 Gy in 5 fractions  Beams/energy:    1) Tangents, prone / 10 and 18 MV photons 2) Tangents, prone / 6 and 18 MV photons  Narrative: The patient tolerated radiation treatment relatively well.    She was tired, but continued to work. She developed diffuse erythema/pigmentation and dry desquamation at the inframammary fold.  Plan: The patient has completed radiation treatment. The patient will return to radiation oncology clinic for routine followup in one month. I advised them to call or return sooner if they have any questions or concerns related to their recovery or treatment.  -----------------------------------  Lonie Peak, MD

## 2012-09-04 NOTE — Progress Notes (Signed)
Photon Boost Simulation Treatment Planning Note DOS: 08-10-12  Diagnosis: Breast Cancer  The patient's CT images from her prone simulation were reviewed to plan her boost treatment to her left breast  lumpectomy cavity.  The boost to the lumpectomy cavity will be delivered with 2 photon fields (opposed mini tangents) using MLCs for custom blocks with 6 and 18 MV photon energy. 10 Gy in 5 fractions prescribed.

## 2012-09-11 ENCOUNTER — Telehealth: Payer: Self-pay | Admitting: Oncology

## 2012-09-11 ENCOUNTER — Other Ambulatory Visit: Payer: Self-pay | Admitting: Oncology

## 2012-09-11 NOTE — Telephone Encounter (Signed)
S/w the pt and she is aware of her revised appt time on 09/14/2012@1 :15pm

## 2012-09-14 ENCOUNTER — Encounter: Payer: Self-pay | Admitting: Adult Health

## 2012-09-14 ENCOUNTER — Other Ambulatory Visit (HOSPITAL_BASED_OUTPATIENT_CLINIC_OR_DEPARTMENT_OTHER): Payer: BC Managed Care – PPO

## 2012-09-14 ENCOUNTER — Ambulatory Visit: Payer: BC Managed Care – PPO | Admitting: Oncology

## 2012-09-14 ENCOUNTER — Telehealth: Payer: Self-pay | Admitting: Oncology

## 2012-09-14 ENCOUNTER — Ambulatory Visit (HOSPITAL_BASED_OUTPATIENT_CLINIC_OR_DEPARTMENT_OTHER): Payer: BC Managed Care – PPO | Admitting: Adult Health

## 2012-09-14 VITALS — BP 110/76 | HR 80 | Temp 98.8°F | Resp 20 | Ht 67.0 in | Wt 180.3 lb

## 2012-09-14 DIAGNOSIS — C773 Secondary and unspecified malignant neoplasm of axilla and upper limb lymph nodes: Secondary | ICD-10-CM

## 2012-09-14 DIAGNOSIS — C50419 Malignant neoplasm of upper-outer quadrant of unspecified female breast: Secondary | ICD-10-CM

## 2012-09-14 DIAGNOSIS — C50919 Malignant neoplasm of unspecified site of unspecified female breast: Secondary | ICD-10-CM

## 2012-09-14 DIAGNOSIS — Z853 Personal history of malignant neoplasm of breast: Secondary | ICD-10-CM

## 2012-09-14 LAB — CBC WITH DIFFERENTIAL/PLATELET
BASO%: 0.8 % (ref 0.0–2.0)
EOS%: 2 % (ref 0.0–7.0)
HCT: 35.9 % (ref 34.8–46.6)
LYMPH%: 13.2 % — ABNORMAL LOW (ref 14.0–49.7)
MCH: 29.2 pg (ref 25.1–34.0)
MCHC: 33.6 g/dL (ref 31.5–36.0)
NEUT%: 76.5 % (ref 38.4–76.8)
lymph#: 0.6 10*3/uL — ABNORMAL LOW (ref 0.9–3.3)

## 2012-09-14 LAB — COMPREHENSIVE METABOLIC PANEL (CC13)
ALT: 24 U/L (ref 0–55)
AST: 31 U/L (ref 5–34)
Alkaline Phosphatase: 94 U/L (ref 40–150)
Chloride: 106 mEq/L (ref 98–107)
Creatinine: 1 mg/dL (ref 0.6–1.1)
Total Bilirubin: 0.6 mg/dL (ref 0.20–1.20)

## 2012-09-14 MED ORDER — TAMOXIFEN CITRATE 20 MG PO TABS: 20.0000 mg | ORAL_TABLET | Freq: Every day | ORAL | Status: DC

## 2012-09-14 NOTE — Telephone Encounter (Signed)
gve the pt her nov 2013 mammo appt along with the jan 2014 appt calendar

## 2012-09-14 NOTE — Patient Instructions (Signed)

## 2012-09-14 NOTE — Progress Notes (Signed)
OFFICE PROGRESS NOTE  CC  Alisha Freud, DO [phone] [facility] [address] [place] [address] Dr. Emelia Loron Dr. 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 Patient is now status post left breast lumpectomy with left axillary lymph node dissection performed on 02/10/2012  PRIOR THERAPY:  #1 patient underwent a left breast lumpectomy that revealed 2 foci of invasive ductal carcinoma measuring 1.3 cm and 0.8 cm grade 2 with adjacent high-grade ductal carcinoma in situ with no evidence of angiolymphatic invasion. The tumor was estrogen receptor +97% progesterone receptor +100% HER-2/neu negative proliferation marker 48% second focus was ER +96% PR +99% HER-2/neu negative with a proliferation marker 74%. 1/22 lymph nodes were examined all of which were positive for metastatic disease  .#2 Patient is now status post 4 cycles of FEC 100 given between 03/23/2012 2 05/10/2012. She completed all of her therapy and tolerated it reasonably well.  #3 she was started on single agent weekly Taxol at 80 mg per meter squared x12 weeks starting 05/23/2012, however discontinued it due to side effects  #4 Radiation therapy  CURRENT THERAPY: Tamoxifen daily  INTERVAL HISTORY: Alisha Chen 46 y.o. female returns for Followup visit.  She has completed her radiation therapy.  She is doing well, and is here to discuss beginning Tamoxifen.  She is c/o some weight gain, but is otherwise well and w/o complaints.  She says that she watches her diet and exercises regularly.    MEDICAL HISTORY: Past Medical History  Diagnosis Date  . Back pain   . Hx: UTI (urinary tract infection)   . Status post chemotherapy 1 dose 05/23/12    Stopped after one cycle of Taxol due to Grade 2 Toxicity  . Status post  chemotherapy 1 dose 05/23/12    Stopped after one cycle of Taxol due to Grade 2 Toxicity  . Breast cancer     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,dcis  . History of chemotherapy 03/23/12- 05/10/12    s/p 4 cycles of FEC     ALLERGIES:  is allergic to shellfish-derived products; clarithromycin; ondansetron; and penicillins.  MEDICATIONS:  Current Outpatient Prescriptions  Medication Sig Dispense Refill  . emollient (BIAFINE) cream Apply topically as needed.      . hyaluronate sodium (RADIAPLEXRX) GEL Apply topically 2 (two) times daily.      Marland Kitchen lidocaine-prilocaine (EMLA) cream Apply topically as needed. Apply to port area prior to chemotherapy  30 g  3  . non-metallic deodorant (ALRA) MISC Apply 1 application topically 2 (two) times daily.        SURGICAL HISTORY:  Past Surgical History  Procedure Date  . Spine surgery 01/2011  . Foot surgery     multiple  . Back surgery     LUMBAR DECOMPRESSION  . Portacath placement 02/10/2012    Procedure: INSERTION PORT-A-CATH;  Surgeon: Emelia Loron, MD;  Location: [facility];  Service: General;  Laterality: Right;  . Needle core biospy 01/09/12    Left Breast: Invasive Ductal Carcinoma, Lymph Node Axilla: Metastatic Mammary Carcinoma  . Breast lumpectomy 02/10/12    Left Breast: 2 Foci of Invasive Ductal Caand High grade Ductal Carcinoma In Situ, 0/14 nodes Left Axilla Negative : Regional Resection of Lymph Nodes: 0/5 Nodes Negative  . Regional resection  03/06/12    Left Axilla: 1/3 Nodes Metastatic Carcinoma  . Intrauterine device removal 01/24/12  . Breast biopsy 01/09/2012    left breast 3 0'clock/ER?PR =positive,her 2 neg  . Axillary surgery 03/06/12    left, regional resection, 1/3 nodes pos   Health Maintenance Mammogram: 09/2011 Colonoscopy: n/a Bone Density Scan:n/a Pap Smear: 01/2012 Eye Exam: n/a Vitamin D Level:n/a Lipid Panel: prior normal   REVIEW OF SYSTEMS:   General: fatigue (-),  night sweats (-), fever (-), pain (-) Lymph: palpable nodes (-) HEENT: vision changes (-), mucositis (-), gum bleeding (-), epistaxis (-) Cardiovascular: chest pain (-), palpitations (-) Pulmonary: shortness of breath (-), dyspnea on exertion (-), cough (-), hemoptysis (-) GI:  Early satiety (-), melena (-), dysphagia (-), nausea/vomiting (-), diarrhea (-) GU: dysuria (-), hematuria (-), incontinence (-) Musculoskeletal: joint swelling (-), joint pain (-), back pain (-) Neuro: weakness (-), numbness (-), headache (-), confusion (-) Skin: Rash (-), lesions (-), dryness (-) Psych: depression (-), suicidal/homicidal ideation (-), feeling of hopelessness (-)  PHYSICAL EXAMINATION:  BP 110/76  Pulse 80  Temp 98.8 F (37.1 C) (Oral)  Resp 20  Ht 5\' 7"  (1.702 m)  Wt 180 lb 4.8 oz (81.784 kg)  BMI 28.24 kg/m2 General: Patient is a well appearing female in no acute distress HEENT: PERRLA, sclerae anicteric no conjunctival pallor, MMM Neck: supple, no palpable adenopathy Lungs: clear to auscultation bilaterally, no wheezes, rhonchi, or rales Cardiovascular: regular rate rhythm, S1, S2, no murmurs, rubs or gallops Abdomen: Soft, non-tender, non-distended, normoactive bowel sounds, no HSM Extremities: warm and well perfused, no clubbing, cyanosis, or edema Skin: No rashes or lesions Neuro: Non-focal Breasts: right breast no masses, or skin changes, left breast lumpectomy scar well healed, no nodularity or sign of recurrence ECOG PERFORMANCE STATUS: 2    LABORATORY DATA: Lab Results  Component Value Date   WBC 4.9 09/14/2012   HGB 12.1 09/14/2012   HCT 35.9 09/14/2012   MCV 86.8 09/14/2012   PLT 172 09/14/2012      Chemistry      Component Value Date/Time   NA 140 05/29/2012 1501   K 4.4 05/29/2012 1501   CL 105 05/29/2012 1501   CO2 28 05/29/2012 1501   BUN 13 05/29/2012 1501   CREATININE 0.84 05/29/2012 1501      Component Value Date/Time   CALCIUM 10.8* 05/29/2012 1501   ALKPHOS 77  05/29/2012 1501   AST 18 05/29/2012 1501   ALT 12 05/29/2012 1501   BILITOT 0.2* 05/29/2012 1501       RADIOGRAPHIC STUDIES:  ASSESSMENT: 46 year old female with:  1.  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 The primary tumor revealed 2 foci measuring 1.3 and 0.8 cm.with 1/22 LN positive for metastatic disease. She has now completed 4 cycles of FEC 100. She overall tolerated this well.She began single agent Taxol on 05/23/2012. She received one dose so far and has had significant grade 2 toxicity with deterioration of her performance status to about a 3.  #2 patient Patient is declining any further chemotherapy. We had an extensive discussion using adjuvant on line about her risk of relapse. She understands the risks and benefits of discontinuing chemotherapy. However due to the side effects that she is experiencing she has decided that she is willing to take the adjuvant hormonal therapy but will not take chemotherapy any further.   PLAN:  #1 Ms. Rome has completed her radiation therapy.  She is now ready to start on Tamoxifen 20mg  daily.  I covered with her in detail this therapy and gave her information to take home.  She will f/u with Dr. Dwain Sarna on 11/21 to discuss port removal.    #2 We also discussed health maintenance, and the recommendation of annual pap smears and cholesterol monitoring.  I also ordered her diagnostic mammogram.  #3 I sent in a referral for her to see a dietician to discuss healthy eating and exercise.   I spent > counseling the patient face to face. The total time spent in the appointment was 30 minutes.   Cherie Ouch Lyn Hollingshead, NP Medical Oncology [facility] Phone: [phone]

## 2012-09-25 ENCOUNTER — Inpatient Hospital Stay
Admission: RE | Admit: 2012-09-25 | Discharge: 2012-09-25 | Disposition: A | Discharge status: Home / Self Care | Payer: BC Managed Care – PPO | Source: Ambulatory Visit | Attending: Radiation Oncology | Admitting: Radiation Oncology

## 2012-09-25 ENCOUNTER — Encounter: Payer: Self-pay | Admitting: Radiation Oncology

## 2012-09-25 VITALS — BP 106/70 | HR 67 | Temp 97.5°F | Resp 20 | Wt 180.7 lb

## 2012-09-25 DIAGNOSIS — C50419 Malignant neoplasm of upper-outer quadrant of unspecified female breast: Secondary | ICD-10-CM

## 2012-09-25 HISTORY — DX: Personal history of irradiation: Z92.3

## 2012-09-25 NOTE — Progress Notes (Addendum)
Radiation Oncology         [phone] ________________________________  Name: Alisha Chen MRN: [id number]  Date: 09/25/2012  DOB: [birth year]  Follow-Up Visit Note  Diagnosis:   Left breast cancer, T1 C. N1 M0 invasive ductal carcinoma Interval Since Last Radiation:  She completed a total dose of 60 gray in 30 fractions on 08/23/2012  Narrative:  The patient returns today for routine follow-up.  Doing well. Fatigue is improving. Tolerating tamoxifen with no acute effects thus far. She is applying Keri lotion to her breast                             ALLERGIES:  is allergic to shellfish-derived products; clarithromycin; ondansetron; and penicillins.  Meds: Current Outpatient Prescriptions  Medication Sig Dispense Refill  . non-metallic deodorant (ALRA) MISC Apply 1 application topically 2 (two) times daily.      . tamoxifen (NOLVADEX) 20 MG tablet Take 1 tablet (20 mg total) by mouth daily.  90 tablet  12  . lidocaine-prilocaine (EMLA) cream Apply topically as needed. Apply to port area prior to chemotherapy  30 g  3    Physical Findings: The patient is in no acute distress. Patient is alert and oriented.  weight is 180 lb 11.2 oz (81.965 kg). Her oral temperature is 97.5 F (36.4 C). Her blood pressure is 106/70 and her pulse is 67. Her respiration is 20. Marland Kitchen  No significant changes. No acute distress. Alopecia is resolving. Left breast demonstrates residual hyperpigmentation, most notable at the inframammary fold. However this has improved. Skin is intact. No palpable supraclavicular lymphadenopathy  Lab Findings: Lab Results  Component Value Date   WBC 4.9 09/14/2012   HGB 12.1 09/14/2012   HCT 35.9 09/14/2012   MCV 86.8 09/14/2012   PLT 172 09/14/2012    CMP     Component Value Date/Time   NA 143 09/14/2012 1338   NA 140 05/29/2012 1501   K 4.2 09/14/2012 1338   K 4.4 05/29/2012 1501   CL 106 09/14/2012 1338   CL 105 05/29/2012 1501   CO2 27 09/14/2012 1338   CO2 28 05/29/2012 1501   GLUCOSE 74 09/14/2012 1338   GLUCOSE 97 05/29/2012 1501   BUN 19.0 09/14/2012 1338   BUN 13 05/29/2012 1501   CREATININE 1.0 09/14/2012 1338   CREATININE 0.84 05/29/2012 1501   CALCIUM 11.5* 09/14/2012 1338   CALCIUM 10.8* 05/29/2012 1501   PROT 7.2 09/14/2012 1338   PROT 6.9 05/29/2012 1501   ALBUMIN 4.1 09/14/2012 1338   ALBUMIN 3.5 05/29/2012 1501   AST 31 09/14/2012 1338   AST 18 05/29/2012 1501   ALT 24 09/14/2012 1338   ALT 12 05/29/2012 1501   ALKPHOS 94 09/14/2012 1338   ALKPHOS 77 05/29/2012 1501   BILITOT 0.60 09/14/2012 1338   BILITOT 0.2* 05/29/2012 1501   GFRNONAA >60 02/10/2011 1440   GFRAA  Value: >60        The eGFR has been calculated using the MDRD equation. This calculation has not been validated in all clinical situations. eGFR's persistently <60 mL/min signify possible Chronic Kidney Disease. 02/10/2011 1440      Radiographic Findings: No results found.  Impression/Plan: I told her she can use vitamin E lotion or oil over her breast for further healing.  I encouraged her to continue with yearly mammography and followup with medical oncology. Her next mammogram is on 10/12/2012. I told her that  if she feels too tender to undergo that mammogram at the end of November, it is okay to push it to the middle or end of December. I will see her back on an as-needed basis. I have encouraged her to call if she has any issues or concerns in the future. I wished her the very best. _____________________________________   Lonie Peak, MD

## 2012-09-25 NOTE — Progress Notes (Signed)
Follow up s/p radiation therapy left breast :07/10/12-08/23/12 Alert,oriented x3, started tamoxifen 20mg  po daily, , has occasional twinges left bbreast and under axilla, well healed, eating and drinking okay, puffiness under neath her left arm, occaional throbbing/pain 3:19 PM

## 2012-10-11 ENCOUNTER — Ambulatory Visit (INDEPENDENT_AMBULATORY_CARE_PROVIDER_SITE_OTHER): Payer: BC Managed Care – PPO | Admitting: General Surgery

## 2012-10-11 ENCOUNTER — Encounter (INDEPENDENT_AMBULATORY_CARE_PROVIDER_SITE_OTHER): Payer: Self-pay | Admitting: General Surgery

## 2012-10-11 VITALS — BP 102/80 | HR 68 | Temp 97.1°F | Resp 16 | Ht 67.0 in | Wt 178.0 lb

## 2012-10-11 DIAGNOSIS — C50919 Malignant neoplasm of unspecified site of unspecified female breast: Secondary | ICD-10-CM

## 2012-10-11 NOTE — Progress Notes (Signed)
Patient ID: Alisha Chen, female   DOB: 14-Dec-1965, 46 y.o.   MRN: [id number]  Chief Complaint  Patient presents with  . Follow-up    reck Lt br lumpectomy - discuss port removal    HPI Alisha Chen is a 46 y.o. female.   HPI 73 yof who underwent left breast lumpectomy and alnd that revealed 2 foci of invasive ductal carcinoma measuring 1.3 cm and 0.8 cm grade 2 with adjacent high-grade ductal carcinoma in situ with no evidence of angiolymphatic invasion. The tumor was estrogen receptor +97% progesterone receptor +100% HER-2/neu negative proliferation marker 48% second focus was ER +96% PR +99% HER-2/neu negative with a proliferation marker 74%. 1/22 lymph nodes were examined all of which were positive for metastatic disease  She then completed chemo and radiation therapy but stopped taxol a little early. She is doing well without any real complaints and comes in today to ask to have port removed.  Past Medical History  Diagnosis Date  . Back pain   . Hx: UTI (urinary tract infection)   . Status post chemotherapy 1 dose 05/23/12    Stopped after one cycle of Taxol due to Grade 2 Toxicity  . Status post chemotherapy 1 dose 05/23/12    Stopped after one cycle of Taxol due to Grade 2 Toxicity  . Breast cancer     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,dcis  . History of chemotherapy 03/23/12- 05/10/12    s/p 4 cycles of FEC   . S/P radiation therapy 07/10/12-08/23/12    Left Breast: 50 Gy/25 Fractions; Boost: 10 Gy/5 Fractions    Past Surgical History  Procedure Date  . Spine surgery 01/2011  . Foot surgery     multiple  . Back surgery     LUMBAR DECOMPRESSION  . Portacath placement 02/10/2012    Procedure: INSERTION PORT-A-CATH;  Surgeon: Emelia Loron, MD;  Location: [facility];  Service: General;  Laterality: Right;  . Needle core biospy 01/09/12    Left Breast: Invasive Ductal Carcinoma, Lymph Node Axilla: Metastatic Mammary Carcinoma  . Breast  lumpectomy 02/10/12    Left Breast: 2 Foci of Invasive Ductal Caand High grade Ductal Carcinoma In Situ, 0/14 nodes Left Axilla Negative : Regional Resection of Lymph Nodes: 0/5 Nodes Negative  . Regional resection 03/06/12    Left Axilla: 1/3 Nodes Metastatic Carcinoma  . Intrauterine device removal 01/24/12  . Breast biopsy 01/09/2012    left breast 3 0'clock/ER?PR =positive,her 2 neg  . Axillary surgery 03/06/12    left, regional resection, 1/3 nodes pos    Family History  Problem Relation Age of Onset  . Diabetes Mother     Social History History  Substance Use Topics  . Smoking status: Never Smoker   . Smokeless tobacco: Not on file  . Alcohol Use: No    Allergies  Allergen Reactions  . Shellfish-Derived Products Hives    Makes "me feel like I'm burning all over."  . Clarithromycin     REACTION: NAUSEA,WEAK,BITTER TASTE  . Ondansetron Nausea And Vomiting    Headaches  . Penicillins     Per pt's mom; pt doesn't remember.    Current Outpatient Prescriptions  Medication Sig Dispense Refill  . non-metallic deodorant (ALRA) MISC Apply 1 application topically 2 (two) times daily.      . tamoxifen (NOLVADEX) 20 MG tablet Take 1 tablet (20 mg total) by mouth daily.  90 tablet  12    Review of Systems  Review of Systems  Blood pressure 102/80, pulse 68, temperature 97.1 F (36.2 C), temperature source Temporal, resp. rate 16, height 5\' 7"  (1.702 m), weight 178 lb (80.74 kg).  Physical Exam Physical Exam  Vitals reviewed. Constitutional: She appears well-developed and well-nourished.  Pulmonary/Chest:      Data Reviewed Notes from oncology  Assessment    Breast cancer, no longer needs venous access    Plan    Port removal She has completed her therapy for now if she would like her port removed. I discussed port removal and the risks and benefits associated with that as well as the postoperative course.       Aadi Bordner 10/11/2012, 4:35 PM

## 2012-10-12 ENCOUNTER — Ambulatory Visit
Admission: RE | Admit: 2012-10-12 | Discharge: 2012-10-12 | Disposition: A | Discharge status: Home / Self Care | Payer: BC Managed Care – PPO | Source: Ambulatory Visit | Attending: Adult Health | Admitting: Adult Health

## 2012-10-12 DIAGNOSIS — Z853 Personal history of malignant neoplasm of breast: Secondary | ICD-10-CM

## 2012-10-17 ENCOUNTER — Telehealth: Payer: Self-pay | Admitting: Oncology

## 2012-10-17 NOTE — Telephone Encounter (Signed)
S/w the pt and she is aware of her nutrition appt in dec .

## 2012-10-24 ENCOUNTER — Ambulatory Visit: Payer: BC Managed Care – PPO | Admitting: Nutrition

## 2012-10-24 ENCOUNTER — Encounter: Payer: Self-pay | Admitting: Nutrition

## 2012-10-24 NOTE — Progress Notes (Signed)
Patient did not show up for scheduled nutrition appointment. 

## 2012-10-24 NOTE — Progress Notes (Signed)
Patient arrived late for nutrition appointment. Patient is interested in information on controlling her weight with healthy diet and exercise. She feels like tamoxifen is contributing to weight gain. Patient reports she does consume somewhat of a healthy plant-based diet. She exercises for 60-75 minutes 2-4 times a week and also does some strength training.  Nutrition diagnosis: Food and nutrition related knowledge deficit related to diagnosis of breast cancer as evidenced by no prior need for nutrition related information.  Intervention: Educated patient on strategies for eating a diet rich in plant based foods.  Discouraged intake of concentrated sweets and processed foods.  Encouraged patient to increase physical activity to a minimum of 150 minutes weekly and try to increase to goal of 300 minutes weekly.  Patient also encouraged to add interval training and strength training on a regular basis.  Provided fact sheets and apps for monitoring diet and exercise.  Teach back method used.  Monitoring, evaluation, goals: Patient will tolerate a healthier plant-based diet and increase physical activity to promote slow safe weight loss and reduce risk for recurrence of breast cancer.  Next visit: Patient has my contact information and will call me with questions or concerns.

## 2012-11-02 DIAGNOSIS — Z452 Encounter for adjustment and management of vascular access device: Secondary | ICD-10-CM

## 2012-11-16 ENCOUNTER — Encounter (INDEPENDENT_AMBULATORY_CARE_PROVIDER_SITE_OTHER): Payer: Self-pay

## 2012-11-26 ENCOUNTER — Ambulatory Visit (INDEPENDENT_AMBULATORY_CARE_PROVIDER_SITE_OTHER): Payer: BC Managed Care – PPO | Admitting: General Surgery

## 2012-11-26 ENCOUNTER — Encounter (INDEPENDENT_AMBULATORY_CARE_PROVIDER_SITE_OTHER): Payer: Self-pay | Admitting: General Surgery

## 2012-11-26 VITALS — BP 112/70 | HR 60 | Resp 14 | Ht 67.0 in | Wt 180.0 lb

## 2012-11-26 DIAGNOSIS — Z09 Encounter for follow-up examination after completed treatment for conditions other than malignant neoplasm: Secondary | ICD-10-CM

## 2012-11-26 NOTE — Progress Notes (Signed)
Subjective:     Patient ID: Alisha Chen, female   DOB: 1966-05-10, 47 y.o.   MRN: [id number]  HPI  41 yof treated for breast cancer who I removed her port recently.  She has done well and returns today without complaint. Review of Systems     Objective:   Physical Exam Healing right chest incision without infection    Assessment:     S/p port removal    Plan:     She is doing well.  I released her to full activity and she will resume normal breast cancer follow up

## 2012-12-21 ENCOUNTER — Ambulatory Visit (HOSPITAL_BASED_OUTPATIENT_CLINIC_OR_DEPARTMENT_OTHER): Payer: BC Managed Care – PPO | Admitting: Lab

## 2012-12-21 ENCOUNTER — Ambulatory Visit (HOSPITAL_BASED_OUTPATIENT_CLINIC_OR_DEPARTMENT_OTHER): Payer: BC Managed Care – PPO | Admitting: Oncology

## 2012-12-21 ENCOUNTER — Telehealth: Payer: Self-pay | Admitting: Oncology

## 2012-12-21 ENCOUNTER — Other Ambulatory Visit: Payer: BC Managed Care – PPO | Admitting: Lab

## 2012-12-21 ENCOUNTER — Encounter: Payer: Self-pay | Admitting: Oncology

## 2012-12-21 VITALS — BP 118/80 | HR 78 | Temp 97.9°F | Resp 20 | Ht 67.0 in | Wt 181.4 lb

## 2012-12-21 DIAGNOSIS — Z170 Estrogen receptor positive status [ER+]: Secondary | ICD-10-CM

## 2012-12-21 DIAGNOSIS — C50419 Malignant neoplasm of upper-outer quadrant of unspecified female breast: Secondary | ICD-10-CM

## 2012-12-21 DIAGNOSIS — C773 Secondary and unspecified malignant neoplasm of axilla and upper limb lymph nodes: Secondary | ICD-10-CM

## 2012-12-21 LAB — CBC WITH DIFFERENTIAL/PLATELET
BASO%: 0.7 % (ref 0.0–2.0)
EOS%: 2.5 % (ref 0.0–7.0)
HCT: 36.6 % (ref 34.8–46.6)
LYMPH%: 23.6 % (ref 14.0–49.7)
MCH: 29.2 pg (ref 25.1–34.0)
MCHC: 33.5 g/dL (ref 31.5–36.0)
MCV: 87.1 fL (ref 79.5–101.0)
NEUT%: 66.7 % (ref 38.4–76.8)
Platelets: 158 10*3/uL (ref 145–400)

## 2012-12-21 LAB — COMPREHENSIVE METABOLIC PANEL (CC13)
ALT: 14 U/L (ref 0–55)
AST: 18 U/L (ref 5–34)
BUN: 13.5 mg/dL (ref 7.0–26.0)
Creatinine: 0.9 mg/dL (ref 0.6–1.1)
Total Bilirubin: 0.47 mg/dL (ref 0.20–1.20)

## 2012-12-21 NOTE — Telephone Encounter (Signed)
gv pt appt schedule for July.  

## 2012-12-21 NOTE — Patient Instructions (Addendum)
Continue tamoxifen 20 mg daily  Take  Vitamin B12  Daily and vitamin D3 2000 iu daily  I will see you back in 6 months

## 2012-12-21 NOTE — Progress Notes (Signed)
OFFICE PROGRESS NOTE  CC  Alisha Freud, DO [phone] [facility] [address] [place] [address] Dr. Emelia Loron Dr. 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 Patient is now status post left breast lumpectomy with left axillary lymph node dissection performed on 02/10/2012  PRIOR THERAPY:  #1 patient underwent a left breast lumpectomy that revealed 2 foci of invasive ductal carcinoma measuring 1.3 cm and 0.8 cm grade 2 with adjacent high-grade ductal carcinoma in situ with no evidence of angiolymphatic invasion. The tumor was estrogen receptor +97% progesterone receptor +100% HER-2/neu negative proliferation marker 48% second focus was ER +96% PR +99% HER-2/neu negative with a proliferation marker 74%. 1/22 lymph nodes were examined all of which were positive for metastatic disease  .#2 Patient is now status post 4 cycles of FEC 100 given between 03/23/2012 2 05/10/2012. She completed all of her therapy and tolerated it reasonably well.  #3 she was started on single agent weekly Taxol at 80 mg per meter squared x12 weeks starting 05/23/2012, however discontinued it due to side effects  #4 Radiation therapy 07/10/12 - 08/23/12  #4 patient was begun on tamoxifen starting on 09/14/2012. Total of 10 years of therapy is planned.  CURRENT THERAPY: Tamoxifen 20 mg  daily  INTERVAL HISTORY: Alisha Chen 47 y.o. female returns for Followup visit.  She is now about 3 months since starting her tamoxifen. She tells me that there are times when she forgets to take the tamoxifen that she is not good at taking pills. When she does take it however she feels a little bit of woozy and lightheaded and very tired and slowed mentation. She denies any nausea vomiting no hot flashes. She is also  concerned about her weight gain. She has not resumed her menstrual cycle. She has no lower extremity swelling. She has no back pain no vaginal bleeding no diarrhea or constipation no easy bruising. Remainder of the 10 point review of systems is negative.  MEDICAL HISTORY: Past Medical History  Diagnosis Date  . Back pain   . Hx: UTI (urinary tract infection)   . Status post chemotherapy 1 dose 05/23/12    Stopped after one cycle of Taxol due to Grade 2 Toxicity  . Status post chemotherapy 1 dose 05/23/12    Stopped after one cycle of Taxol due to Grade 2 Toxicity  . Breast cancer     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,dcis  . History of chemotherapy 03/23/12- 05/10/12    s/p 4 cycles of FEC   . S/P radiation therapy 07/10/12-08/23/12    Left Breast: 50 Gy/25 Fractions; Boost: 10 Gy/5 Fractions    ALLERGIES:  is allergic to shellfish-derived products; clarithromycin; ondansetron; and penicillins.  MEDICATIONS:  Current Outpatient Prescriptions  Medication Sig Dispense Refill  . tamoxifen (NOLVADEX) 20 MG tablet Take 1 tablet (20 mg total) by mouth daily.  90 tablet  12    SURGICAL HISTORY:  Past Surgical History  Procedure Date  . Spine surgery 01/2011  . Foot surgery     multiple  . Back surgery     LUMBAR DECOMPRESSION  . Portacath placement 02/10/2012    Procedure: INSERTION PORT-A-CATH;  Surgeon: Emelia Loron, MD;  Location: [facility];  Service: General;  Laterality: Right;  . Needle core  biospy 01/09/12    Left Breast: Invasive Ductal Carcinoma, Lymph Node Axilla: Metastatic Mammary Carcinoma  . Breast lumpectomy 02/10/12    Left Breast: 2 Foci of Invasive Ductal Caand High grade Ductal Carcinoma In Situ, 0/14 nodes Left Axilla Negative : Regional Resection of Lymph Nodes: 0/5 Nodes Negative  . Regional resection 03/06/12    Left Axilla: 1/3 Nodes Metastatic Carcinoma  . Intrauterine device removal 01/24/12  . Breast biopsy 01/09/2012    left  breast 3 0'clock/ER?PR =positive,her 2 neg  . Axillary surgery 03/06/12    left, regional resection, 1/3 nodes pos   Health Maintenance Mammogram: 09/2011 Colonoscopy: n/a Bone Density Scan:n/a Pap Smear: 01/2012 Eye Exam: n/a Vitamin D Level:n/a Lipid Panel: prior normal   REVIEW OF SYSTEMS:   General: fatigue (-), night sweats (-), fever (-), pain (-) Lymph: palpable nodes (-) HEENT: vision changes (-), mucositis (-), gum bleeding (-), epistaxis (-) Cardiovascular: chest pain (-), palpitations (-) Pulmonary: shortness of breath (-), dyspnea on exertion (-), cough (-), hemoptysis (-) GI:  Early satiety (-), melena (-), dysphagia (-), nausea/vomiting (-), diarrhea (-) GU: dysuria (-), hematuria (-), incontinence (-) Musculoskeletal: joint swelling (-), joint pain (-), back pain (-) Neuro: weakness (-), numbness (-), headache (-), confusion (-) Skin: Rash (-), lesions (-), dryness (-) Psych: depression (-), suicidal/homicidal ideation (-), feeling of hopelessness (-)  PHYSICAL EXAMINATION:  BP 118/80  Pulse 78  Temp 97.9 F (36.6 C) (Oral)  Resp 20  Ht 5\' 7"  (1.702 m)  Wt 181 lb 6.4 oz (82.283 kg)  BMI 28.41 kg/m2 General: Patient is a well appearing female in no acute distress HEENT: PERRLA, sclerae anicteric no conjunctival pallor, MMM Neck: supple, no palpable adenopathy Lungs: clear to auscultation bilaterally, no wheezes, rhonchi, or rales Cardiovascular: regular rate rhythm, S1, S2, no murmurs, rubs or gallops Abdomen: Soft, non-tender, non-distended, normoactive bowel sounds, no HSM Extremities: warm and well perfused, no clubbing, cyanosis, or edema Skin: No rashes or lesions Neuro: Non-focal Breasts: right breast no masses, or skin changes, left breast lumpectomy scar well healed, no nodularity or sign of recurrence ECOG PERFORMANCE STATUS: 2    LABORATORY DATA: Lab Results  Component Value Date   WBC 4.9 09/14/2012   HGB 12.1 09/14/2012   HCT 35.9  09/14/2012   MCV 86.8 09/14/2012   PLT 172 09/14/2012      Chemistry      Component Value Date/Time   NA 143 09/14/2012 1338   NA 140 05/29/2012 1501   K 4.2 09/14/2012 1338   K 4.4 05/29/2012 1501   CL 106 09/14/2012 1338   CL 105 05/29/2012 1501   CO2 27 09/14/2012 1338   CO2 28 05/29/2012 1501   BUN 19.0 09/14/2012 1338   BUN 13 05/29/2012 1501   CREATININE 1.0 09/14/2012 1338   CREATININE 0.84 05/29/2012 1501      Component Value Date/Time   CALCIUM 11.5* 09/14/2012 1338   CALCIUM 10.8* 05/29/2012 1501   ALKPHOS 94 09/14/2012 1338   ALKPHOS 77 05/29/2012 1501   AST 31 09/14/2012 1338   AST 18 05/29/2012 1501   ALT 24 09/14/2012 1338   ALT 12 05/29/2012 1501   BILITOT 0.60 09/14/2012 1338   BILITOT 0.2* 05/29/2012 1501       RADIOGRAPHIC STUDIES:  ASSESSMENT: 47 year old female with:  1.  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 The primary tumor revealed 2 foci measuring 1.3 and 0.8 cm.with 1/22  LN positive for metastatic disease. She has now completed 4 cycles of FEC 100. She overall tolerated this well.She began single agent Taxol on 05/23/2012. She received one dose so far and has had significant grade 2 toxicity with deterioration of her performance status to about a 3.  #2 patient Patient is declining any further chemotherapy. We had an extensive discussion using adjuvant on line about her risk of relapse. She understands the risks and benefits of discontinuing chemotherapy. However due to the side effects that she is experiencing she has decided that she is willing to take the adjuvant hormonal therapy but will not take chemotherapy any further.   PLAN:  #1 Ms. Spindler has completed her radiation therapy.  She is now  on Tamoxifen 20mg  daily.  I covered with her in detail this therapy and gave her information to take home.   #2 We also discussed health maintenance, and the recommendation of annual pap smears and  cholesterol monitoring.  I also ordered her diagnostic mammogram.  #3 We discussed healthy eating and exercise.   I spent 25 minutes counseling the patient face to face. The total time spent in the appointment was 30 minutes.  Drue Second, MD Medical/Oncology [facility] [phone] (beeper) [phone] (Office)  12/21/2012, 2:45 PM

## 2013-05-10 ENCOUNTER — Encounter (INDEPENDENT_AMBULATORY_CARE_PROVIDER_SITE_OTHER): Payer: Self-pay | Admitting: General Surgery

## 2013-05-10 ENCOUNTER — Ambulatory Visit (INDEPENDENT_AMBULATORY_CARE_PROVIDER_SITE_OTHER): Payer: BC Managed Care – PPO | Admitting: General Surgery

## 2013-05-10 VITALS — BP 108/72 | HR 76 | Temp 97.6°F | Resp 16 | Ht 67.0 in | Wt 182.0 lb

## 2013-05-10 DIAGNOSIS — C50912 Malignant neoplasm of unspecified site of left female breast: Secondary | ICD-10-CM

## 2013-05-10 DIAGNOSIS — C50919 Malignant neoplasm of unspecified site of unspecified female breast: Secondary | ICD-10-CM

## 2013-05-10 NOTE — Patient Instructions (Signed)
Breast Self-Examination You should begin examining your breasts at age 48 even though the risk for breast cancer is low in this age group. It is important to become familiar with how your breasts look and feel. This is true for pregnant women, nursing mothers, women in menopause and women who have breast implants.  Women should examine their breasts once a month to look for changes and lumps. By doing monthly breast exams, you get to know how your breasts feel and how they can change from month to month. This allows you to pick up changes early. It can also offer you some reassurance that your breast health is good. This exam only takes minutes. Most breast lumps are not caused by cancer. If you find a lump, a special x-ray called a mammogram, or other tests may be needed to determine what is wrong.  Some of the signs that a breast lump is caused by cancer include:  Dimpling of the skin or changes in the shape of the breast or nipple.   A dark-colored or bloody discharge from the nipple.   Swollen lymph glands around the breast or in the armpit.   Redness of the breast or nipple.   Scaly nipple or skin on the breast.   Pain or swelling of the breast.  SELF-EXAM There are a few points to follow when doing a thorough breast exam. The best time to examine your breasts is 5 to 7 days after the menstrual period is over. During menstruation, the breasts are lumpier, and it may be more difficult to pick up changes. If you do not menstruate, have reached menopause or had a hysterectomy, examine your breasts the first day of every month. After three to four months, you will become more familiar with the variations of your breasts and more comfortable with the exam.  Perform your breast exam monthly. Keep a written record with breast changes or normal findings for each breast. This makes it easier to be sure of changes and to not solely depend on memory for size, tenderness, or location. Try to do the exam  at the same time each month, and write down where you are in your menstrual cycle if you are still menstruating.   Look at your breasts. Stand in front of a mirror with your hands clasped behind your head. Tighten your chest muscles and look for asymmetry. This means a difference in shape or contour from one breast to the other, such as puckers, dips or bumps. Look also for skin changes.   Lean forward with your hands on your hips. Again, look for symmetry and skin changes.   While showering, soap the breasts, and carefully feel the breasts with fingertips while holding the arm (on the side of the breast being examined) over the head. Do this with each breast carefully feeling for lumps or changes. Typically, a circular motion with moderate fingertip pressure should be used.   Repeat this exam while lying on your back, again with your arm behind your head and a pillow under your shoulders. Again, use your fingertips to examine both breasts, feeling for lumps and thickening. Begin at 1 o'clock and go clockwise around the whole breast.   At the end of your exam, gently squeeze each nipple to see if there is any drainage. Look for nipple changes, dimpling or redness.   Lastly, examine the upper chest and clavicle areas and in your armpits.  It is not necessary to become alarmed if you find   a breast lump. Most of them are not cancerous. However, it is necessary to see your caregiver if a lump is found in order to have it looked at. Document Released: 12/15/2004 Document Revised: 07/20/2011 Document Reviewed: 02/24/2009 Exit[employer] Patient Information 2012 [employer]. 

## 2013-05-10 NOTE — Progress Notes (Signed)
Subjective:     Patient ID: Alisha Chen, female   DOB: 01-13-1966, 47 y.o.   MRN: [id number]  HPI This is a 47 year old female underwent a left breast lumpectomy and axillary lymph node dissection for 2 separate tumors that were home receptor positive and HER-2/neu is not amplified. She subsequently has undergone chemotherapy and radiation. She was placed on tamoxifen but has not been taking that due to some side effects. She comes in today as she is concerned about the scar on her left breast as well as a keloid that has shown up after I removed her port. She has a mammogram due in November of 2014.  Review of Systems     Objective:   Physical Exam  Vitals reviewed. Constitutional: She appears well-developed and well-nourished.  Pulmonary/Chest: Right breast exhibits no inverted nipple, no mass, no nipple discharge, no skin change and no tenderness. Left breast exhibits no inverted nipple, no mass, no nipple discharge, no skin change and no tenderness.    Lymphadenopathy:    She has no cervical adenopathy.    She has no axillary adenopathy.       Right: No supraclavicular adenopathy present.       Left: No supraclavicular adenopathy present.       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 I told her that I we will refer her to see a plastic surgeon or we could try should steroid injections in the office for her keloid. We will try injections first. She otherwise has no clinical evidence of recurrence of her breast cancer will continue on her mammographic screening and totally self exams. She also continued to see one of [place] every 6 months.

## 2013-05-23 ENCOUNTER — Other Ambulatory Visit (HOSPITAL_COMMUNITY): Payer: Self-pay | Admitting: Obstetrics

## 2013-05-23 DIAGNOSIS — D259 Leiomyoma of uterus, unspecified: Secondary | ICD-10-CM

## 2013-05-30 ENCOUNTER — Ambulatory Visit (HOSPITAL_COMMUNITY)
Admission: RE | Admit: 2013-05-30 | Discharge: 2013-05-30 | Disposition: A | Discharge status: Home / Self Care | Payer: BC Managed Care – PPO | Source: Ambulatory Visit | Attending: Obstetrics | Admitting: Obstetrics

## 2013-05-30 DIAGNOSIS — D259 Leiomyoma of uterus, unspecified: Secondary | ICD-10-CM

## 2013-05-30 DIAGNOSIS — Z853 Personal history of malignant neoplasm of breast: Secondary | ICD-10-CM | POA: Insufficient documentation

## 2013-05-30 DIAGNOSIS — D252 Subserosal leiomyoma of uterus: Secondary | ICD-10-CM | POA: Insufficient documentation

## 2013-05-30 DIAGNOSIS — Z780 Asymptomatic menopausal state: Secondary | ICD-10-CM | POA: Insufficient documentation

## 2013-06-04 ENCOUNTER — Ambulatory Visit (INDEPENDENT_AMBULATORY_CARE_PROVIDER_SITE_OTHER): Payer: BC Managed Care – PPO | Admitting: General Surgery

## 2013-06-04 ENCOUNTER — Encounter (INDEPENDENT_AMBULATORY_CARE_PROVIDER_SITE_OTHER): Payer: Self-pay | Admitting: General Surgery

## 2013-06-04 VITALS — BP 122/84 | HR 72 | Resp 14 | Ht 67.0 in | Wt 181.4 lb

## 2013-06-04 DIAGNOSIS — L910 Hypertrophic scar: Secondary | ICD-10-CM

## 2013-06-06 NOTE — Progress Notes (Signed)
Subjective:     Patient ID: Alisha Chen, female   DOB: 11-Jan-1966, 47 y.o.   MRN: [id number]  HPI 78 yof I know from her treatment for breast cancer who comes back today after I saw her recently with a worsening keloid at her port site.  I have removed her port not too long ago. This is painful and has increased outside margin of scar .  It is worsening.  She and I discussed last time attempting steroid injection as first step.  We discussed this may not work and we would plan on doing several times.  Review of Systems     Objective:   Physical Exam Right chest with keloid at site of port removal    Assessment:     Keloid     Plan:     We discussed procedure.  We then mixed kenalog with marcaine. I cleansed the scar and surrounding area on her right upper chest.  I then injected about 20 cc of this mixture throughout the scar.  She tolerated this well.  A band aid was placed.  I will plan on seeing her back in several weeks to attempt again.

## 2013-06-09 ENCOUNTER — Encounter (HOSPITAL_COMMUNITY): Payer: Self-pay | Admitting: Nurse Practitioner

## 2013-06-09 ENCOUNTER — Emergency Department (HOSPITAL_COMMUNITY)
Admission: EM | Admit: 2013-06-09 | Discharge: 2013-06-09 | Disposition: A | Discharge status: Home / Self Care | Payer: BC Managed Care – PPO | Attending: Emergency Medicine | Admitting: Emergency Medicine

## 2013-06-09 DIAGNOSIS — R51 Headache: Secondary | ICD-10-CM | POA: Insufficient documentation

## 2013-06-09 DIAGNOSIS — H53149 Visual discomfort, unspecified: Secondary | ICD-10-CM | POA: Insufficient documentation

## 2013-06-09 DIAGNOSIS — Z79899 Other long term (current) drug therapy: Secondary | ICD-10-CM | POA: Insufficient documentation

## 2013-06-09 DIAGNOSIS — R110 Nausea: Secondary | ICD-10-CM | POA: Insufficient documentation

## 2013-06-09 DIAGNOSIS — Z87440 Personal history of urinary (tract) infections: Secondary | ICD-10-CM | POA: Insufficient documentation

## 2013-06-09 DIAGNOSIS — G43909 Migraine, unspecified, not intractable, without status migrainosus: Secondary | ICD-10-CM | POA: Insufficient documentation

## 2013-06-09 DIAGNOSIS — Z853 Personal history of malignant neoplasm of breast: Secondary | ICD-10-CM | POA: Insufficient documentation

## 2013-06-09 DIAGNOSIS — Z880 Allergy status to penicillin: Secondary | ICD-10-CM | POA: Insufficient documentation

## 2013-06-09 MED ORDER — SUMATRIPTAN SUCCINATE 6 MG/0.5ML ~~LOC~~ SOLN
6.0000 mg | Freq: Once | SUBCUTANEOUS | Status: AC
  Administered 2013-06-09: 6 mg via SUBCUTANEOUS
  Filled 2013-06-09: qty 0.5

## 2013-06-09 NOTE — ED Notes (Signed)
Pt was not examined or care given to pt by this nurse, this nurse only discharged pt due to pt's son being seen in pediatric ER.

## 2013-06-09 NOTE — ED Notes (Signed)
Pt reports headache onset last night, took motrin with some relief. This am headache persisted and she took midol with some relief. After taking midol her son had a seizure and she had to bring him to ED for care and then her headache became severe and "started to feel like when i had a migraine." pt is concerned because her blood pressure was high when she checked it herself one day this week and she has never had high blood pressure

## 2013-06-09 NOTE — ED Provider Notes (Signed)
History    CSN: [id number] Arrival date & time 06/09/13  1107  First MD Initiated Contact with Patient 06/09/13 1122     Chief Complaint  Patient presents with  . Migraine   (Consider location/radiation/quality/duration/timing/severity/associated sxs/prior Treatment) Patient is a 47 y.o. female presenting with migraines. The history is provided by the patient. No language interpreter was used.  Migraine This is a new problem. The current episode started yesterday. Associated symptoms include headaches and nausea. Pertinent negatives include no chills, fever, sore throat or vomiting. Associated symptoms comments: Right sided throbbing headache similar in presentation to migraine headaches she has had in the past. She took a Midol without relief, which is what she usually takes, and was concerned because her blood pressure has been elevated lately. Nausea without vomiting. She denies visual changes. No congestion, sore throat or fever. .   Past Medical History  Diagnosis Date  . Back pain   . Hx: UTI (urinary tract infection)   . Status post chemotherapy 1 dose 05/23/12    Stopped after one cycle of Taxol due to Grade 2 Toxicity  . Status post chemotherapy 1 dose 05/23/12    Stopped after one cycle of Taxol due to Grade 2 Toxicity  . Breast cancer     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,dcis  . History of chemotherapy 03/23/12- 05/10/12    s/p 4 cycles of FEC   . S/P radiation therapy 07/10/12-08/23/12    Left Breast: 50 Gy/25 Fractions; Boost: 10 Gy/5 Fractions   Past Surgical History  Procedure Laterality Date  . Spine surgery  01/2011  . Foot surgery      multiple  . Back surgery      LUMBAR DECOMPRESSION  . Portacath placement  02/10/2012    Procedure: INSERTION PORT-A-CATH;  Surgeon: Emelia Loron, MD;  Location: [facility];  Service: General;  Laterality: Right;  . Needle core biospy  01/09/12    Left Breast: Invasive Ductal Carcinoma, Lymph Node  Axilla: Metastatic Mammary Carcinoma  . Breast lumpectomy  02/10/12    Left Breast: 2 Foci of Invasive Ductal Caand High grade Ductal Carcinoma In Situ, 0/14 nodes Left Axilla Negative : Regional Resection of Lymph Nodes: 0/5 Nodes Negative  . Regional resection  03/06/12    Left Axilla: 1/3 Nodes Metastatic Carcinoma  . Intrauterine device removal  01/24/12  . Breast biopsy  01/09/2012    left breast 3 0'clock/ER?PR =positive,her 2 neg  . Axillary surgery  03/06/12    left, regional resection, 1/3 nodes pos   Family History  Problem Relation Age of Onset  . Diabetes Mother    History  Substance Use Topics  . Smoking status: Never Smoker   . Smokeless tobacco: Not on file  . Alcohol Use: No   OB History   Grav Para Term Preterm Abortions TAB SAB Ect Mult Living   1 1             Obstetric Comments   Menarche age 46, Parity age 77, G22, P!, Premenopausal     Review of Systems  Constitutional: Negative for fever and chills.  HENT: Negative for sore throat.   Eyes: Positive for photophobia.  Respiratory: Negative.   Cardiovascular: Negative.   Gastrointestinal: Positive for nausea. Negative for vomiting.  Musculoskeletal: Negative.   Skin: Negative.   Neurological: Positive for headaches.    Allergies  Shellfish-derived products; Clarithromycin; Ondansetron; and Penicillins  Home Medications   Current Outpatient Rx  Name  Route  Sig  Dispense  Refill  . cholecalciferol (VITAMIN D) 1000 UNITS tablet   Oral   Take 1,000 Units by mouth daily.         . tamoxifen (NOLVADEX) 20 MG tablet   Oral   Take 1 tablet (20 mg total) by mouth daily.   90 tablet   12   . vitamin B-12 (CYANOCOBALAMIN) 1000 MCG tablet   Oral   Take 1,000 mcg by mouth daily.          BP 134/75  Pulse 63  Temp(Src) 98.1 F (36.7 C) (Oral)  Resp 16  Ht 5\' 7"  (1.702 m)  Wt 180 lb (81.647 kg)  BMI 28.19 kg/m2  SpO2 97% Physical Exam  Constitutional: She is oriented to person, place, and  time. She appears well-developed and well-nourished.  HENT:  Head: Normocephalic.  Eyes: Pupils are equal, round, and reactive to light.  Neck: Normal range of motion. Neck supple.  Cardiovascular: Normal rate and regular rhythm.   Pulmonary/Chest: Effort normal and breath sounds normal.  Abdominal: Soft. Bowel sounds are normal. There is no tenderness. There is no rebound and no guarding.  Musculoskeletal: Normal range of motion.  Neurological: She is alert and oriented to person, place, and time. She has normal strength and normal reflexes. No cranial nerve deficit or sensory deficit. She displays a negative Romberg sign. Coordination normal.  Skin: Skin is warm and dry. No rash noted.  Psychiatric: She has a normal mood and affect.    ED Course  Procedures (including critical care time) Labs Reviewed - No data to display No results found. No diagnosis found. 1. Migraine  MDM  Confirmed with pharmacy that Imitrex is not contraindicated with Tamoxifen. Head is completely gone with Imitrex. She now just feels generally weak, but without headache or nausea. Will continue to observe.  Re-evaluation:  Patient is ambulatory. Still feels generally weak but reports feeling better than before. She appears improved, is active. Stable for discharge.   Arnoldo Hooker, PA-C 06/09/13 1403

## 2013-06-09 NOTE — ED Provider Notes (Signed)
Medical screening examination/treatment/procedure(s) were performed by non-physician practitioner and as supervising physician I was immediately available for consultation/collaboration.   Ashby Dawes, MD 06/09/13 2001

## 2013-06-10 ENCOUNTER — Telehealth: Payer: Self-pay | Admitting: *Deleted

## 2013-06-10 ENCOUNTER — Other Ambulatory Visit (HOSPITAL_BASED_OUTPATIENT_CLINIC_OR_DEPARTMENT_OTHER): Payer: BC Managed Care – PPO | Admitting: Lab

## 2013-06-10 ENCOUNTER — Encounter: Payer: Self-pay | Admitting: Oncology

## 2013-06-10 ENCOUNTER — Ambulatory Visit (HOSPITAL_BASED_OUTPATIENT_CLINIC_OR_DEPARTMENT_OTHER): Payer: BC Managed Care – PPO | Admitting: Oncology

## 2013-06-10 VITALS — BP 121/84 | HR 78 | Temp 98.2°F | Resp 20 | Ht 67.0 in | Wt 178.2 lb

## 2013-06-10 DIAGNOSIS — C50412 Malignant neoplasm of upper-outer quadrant of left female breast: Secondary | ICD-10-CM

## 2013-06-10 DIAGNOSIS — C50419 Malignant neoplasm of upper-outer quadrant of unspecified female breast: Secondary | ICD-10-CM

## 2013-06-10 LAB — COMPREHENSIVE METABOLIC PANEL (CC13)
AST: 18 U/L (ref 5–34)
Albumin: 3.9 g/dL (ref 3.5–5.0)
BUN: 19.6 mg/dL (ref 7.0–26.0)
Calcium: 10.8 mg/dL — ABNORMAL HIGH (ref 8.4–10.4)
Chloride: 106 mEq/L (ref 98–109)
Creatinine: 1 mg/dL (ref 0.6–1.1)
Glucose: 82 mg/dl (ref 70–140)
Potassium: 4.2 mEq/L (ref 3.5–5.1)

## 2013-06-10 LAB — CBC WITH DIFFERENTIAL/PLATELET
BASO%: 0.8 % (ref 0.0–2.0)
Basophils Absolute: 0 10*3/uL (ref 0.0–0.1)
HCT: 38.6 % (ref 34.8–46.6)
HGB: 12.9 g/dL (ref 11.6–15.9)
LYMPH%: 21.3 % (ref 14.0–49.7)
MCH: 29.2 pg (ref 25.1–34.0)
MCHC: 33.3 g/dL (ref 31.5–36.0)
MONO#: 0.2 10*3/uL (ref 0.1–0.9)
NEUT%: 70.7 % (ref 38.4–76.8)
Platelets: 183 10*3/uL (ref 145–400)
WBC: 4.6 10*3/uL (ref 3.9–10.3)

## 2013-06-10 MED ORDER — TAMOXIFEN CITRATE 20 MG PO TABS: 20.0000 mg | ORAL_TABLET | Freq: Every day | ORAL | Status: DC

## 2013-06-10 NOTE — Patient Instructions (Addendum)
Continue tamoxifen everyday for maximal benefit  We discussed the possibility of starting you on an Aromatase inhibitor (AI) since you may be post-menopausal. However we will have to check your hormone levels prior to starting an AI  I will see you back in 6 months

## 2013-06-10 NOTE — Telephone Encounter (Signed)
appts made and printed...td 

## 2013-06-10 NOTE — Progress Notes (Signed)
OFFICE PROGRESS NOTE  CC  [name], DO [phone] [facility] [address] [place] [address] Dr. Emelia Loron Dr. 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 Patient is now status post left breast lumpectomy with left axillary lymph node dissection performed on 02/10/2012  PRIOR THERAPY:  #1 patient underwent a left breast lumpectomy that revealed 2 foci of invasive ductal carcinoma measuring 1.3 cm and 0.8 cm grade 2 with adjacent high-grade ductal carcinoma in situ with no evidence of angiolymphatic invasion. The tumor was estrogen receptor +97% progesterone receptor +100% HER-2/neu negative proliferation marker 48% second focus was ER +96% PR +99% HER-2/neu negative with a proliferation marker 74%. 1/22 lymph nodes were examined all of which were positive for metastatic disease  .#2 Patient is now status post 4 cycles of FEC 100 given between 03/23/2012 2 05/10/2012. She completed all of her therapy and tolerated it reasonably well.  #3 she was started on single agent weekly Taxol at 80 mg per meter squared x12 weeks starting 05/23/2012, however discontinued it due to side effects  #4 Radiation therapy 07/10/12 - 08/23/12  #4 patient was begun on tamoxifen starting on 09/14/2012. Total of 10 years of therapy is planned.  CURRENT THERAPY: Tamoxifen 20 mg  daily  INTERVAL HISTORY: Alisha Chen 47 y.o. female returns for Followup visit.  She is now about 6 months since starting her tamoxifen. She tells me that there are times when she forgets to take the tamoxifen that she is not good at taking pills. When she does take it however she feels a little bit of woozy and lightheaded and very tired and slowed mentation. She denies any nausea vomiting no hot flashes. She is also  concerned about her weight gain. She has not resumed her menstrual cycle. She has no lower extremity swelling. She has no back pain no vaginal bleeding no diarrhea or constipation no easy bruising. Remainder of the 10 point review of systems is negative.  MEDICAL HISTORY: Past Medical History  Diagnosis Date  . Back pain   . Hx: UTI (urinary tract infection)   . Status post chemotherapy 1 dose 05/23/12    Stopped after one cycle of Taxol due to Grade 2 Toxicity  . Status post chemotherapy 1 dose 05/23/12    Stopped after one cycle of Taxol due to Grade 2 Toxicity  . Breast cancer     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,dcis  . History of chemotherapy 03/23/12- 05/10/12    s/p 4 cycles of FEC   . S/P radiation therapy 07/10/12-08/23/12    Left Breast: 50 Gy/25 Fractions; Boost: 10 Gy/5 Fractions    ALLERGIES:  is allergic to shellfish-derived products; clarithromycin; ondansetron; and penicillins.  MEDICATIONS:  Current Outpatient Prescriptions  Medication Sig Dispense Refill  . cholecalciferol (VITAMIN D) 1000 UNITS tablet Take 1,000 Units by mouth daily.      . tamoxifen (NOLVADEX) 20 MG tablet Take 1 tablet (20 mg total) by mouth daily.  90 tablet  12  . vitamin B-12 (CYANOCOBALAMIN) 1000 MCG tablet Take 1,000 mcg by mouth daily.       No current facility-administered medications for this visit.    SURGICAL HISTORY:  Past Surgical History  Procedure Laterality Date  . Spine surgery  01/2011  . Foot surgery  multiple  . Back surgery      LUMBAR DECOMPRESSION  . Portacath placement  02/10/2012    Procedure: INSERTION PORT-A-CATH;  Surgeon: Emelia Loron, MD;  Location:  [facility];  Service: General;  Laterality: Right;  . Needle core biospy  01/09/12    Left Breast: Invasive Ductal Carcinoma, Lymph Node Axilla: Metastatic Mammary Carcinoma  . Breast lumpectomy  02/10/12    Left Breast: 2 Foci of Invasive Ductal Caand High grade Ductal Carcinoma In  Situ, 0/14 nodes Left Axilla Negative : Regional Resection of Lymph Nodes: 0/5 Nodes Negative  . Regional resection  03/06/12    Left Axilla: 1/3 Nodes Metastatic Carcinoma  . Intrauterine device removal  01/24/12  . Breast biopsy  01/09/2012    left breast 3 0'clock/ER?PR =positive,her 2 neg  . Axillary surgery  03/06/12    left, regional resection, 1/3 nodes pos   Health Maintenance Mammogram: 09/2011 Colonoscopy: n/a Bone Density Scan:n/a Pap Smear: 01/2012 Eye Exam: n/a Vitamin D Level:n/a Lipid Panel: prior normal   REVIEW OF SYSTEMS:   General: fatigue (-), night sweats (-), fever (-), pain (-) Lymph: palpable nodes (-) HEENT: vision changes (-), mucositis (-), gum bleeding (-), epistaxis (-) Cardiovascular: chest pain (-), palpitations (-) Pulmonary: shortness of breath (-), dyspnea on exertion (-), cough (-), hemoptysis (-) GI:  Early satiety (-), melena (-), dysphagia (-), nausea/vomiting (-), diarrhea (-) GU: dysuria (-), hematuria (-), incontinence (-) Musculoskeletal: joint swelling (-), joint pain (-), back pain (-) Neuro: weakness (-), numbness (-), headache (-), confusion (-) Skin: Rash (-), lesions (-), dryness (-) Psych: depression (-), suicidal/homicidal ideation (-), feeling of hopelessness (-)  PHYSICAL EXAMINATION:  BP 121/84  Pulse 78  Temp(Src) 98.2 F (36.8 C) (Oral)  Resp 20  Ht 5\' 7"  (1.702 m)  Wt 178 lb 3.2 oz (80.831 kg)  BMI 27.9 kg/m2 General: Patient is a well appearing female in no acute distress HEENT: PERRLA, sclerae anicteric no conjunctival pallor, MMM Neck: supple, no palpable adenopathy Lungs: clear to auscultation bilaterally, no wheezes, rhonchi, or rales Cardiovascular: regular rate rhythm, S1, S2, no murmurs, rubs or gallops Abdomen: Soft, non-tender, non-distended, normoactive bowel sounds, no HSM Extremities: warm and well perfused, no clubbing, cyanosis, or edema Skin: No rashes or lesions Neuro: Non-focal Breasts: right breast  no masses, or skin changes, left breast lumpectomy scar well healed, no nodularity or sign of recurrence ECOG PERFORMANCE STATUS: 2    LABORATORY DATA: Lab Results  Component Value Date   WBC 4.6 06/10/2013   HGB 12.9 06/10/2013   HCT 38.6 06/10/2013   MCV 87.5 06/10/2013   PLT 183 06/10/2013      Chemistry      Component Value Date/Time   NA 140 12/21/2012 1527   NA 140 05/29/2012 1501   K 4.2 12/21/2012 1527   K 4.4 05/29/2012 1501   CL 105 12/21/2012 1527   CL 105 05/29/2012 1501   CO2 27 12/21/2012 1527   CO2 28 05/29/2012 1501   BUN 13.5 12/21/2012 1527   BUN 13 05/29/2012 1501   CREATININE 0.9 12/21/2012 1527   CREATININE 0.84 05/29/2012 1501      Component Value Date/Time   CALCIUM 10.4 12/21/2012 1527   CALCIUM 10.8* 05/29/2012 1501   ALKPHOS 102 12/21/2012 1527   ALKPHOS 77 05/29/2012 1501   AST 18 12/21/2012 1527   AST 18 05/29/2012 1501   ALT 14 12/21/2012 1527   ALT 12 05/29/2012 1501   BILITOT 0.47 12/21/2012  1527   BILITOT 0.2* 05/29/2012 1501       RADIOGRAPHIC STUDIES:  ASSESSMENT: 47 year old female with:  1.  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 The primary tumor revealed 2 foci measuring 1.3 and 0.8 cm.with 1/22 LN positive for metastatic disease. She has now completed 4 cycles of FEC 100. She overall tolerated this well.She began single agent Taxol on 05/23/2012. She received one dose so far and has had significant grade 2 toxicity with deterioration of her performance status to about a 3.  #2 patient Patient is declining any further chemotherapy. We had an extensive discussion using adjuvant on line about her risk of relapse. She understands the risks and benefits of discontinuing chemotherapy. However due to the side effects that she is experiencing she has decided that she is willing to take the adjuvant hormonal therapy but will not take chemotherapy any further.  #3 patient was begun on tamoxifen 20 mg  daily beginning 09/14/2012. Unfortunately she has not been taking it on a regular basis. She does miss her dose is quite frequently.   PLAN:  #1 patient was started on tamoxifen but she is not taking it on a regular basis. I have encouraged her to take it on a daily basis to get maximal benefit. We also discussed the possibility of switching her over to an aromatase inhibitor such as Aromasin since she has not resumed her periods. However she does understand that we would have to check her hormone levels prior to doing that to make sure she indeed was in menopause. However at this time patient does not want to have any of this done other than continued trying to take the tamoxifen daily.  #2 migraines she did receive Imitrex injection in the emergency room.  #3 We also discussed health maintenance, and the recommendation of annual pap smears and cholesterol monitoring.  I also ordered her diagnostic mammogram.  #3 We discussed healthy eating and exercise.   I spent 25 minutes counseling the patient face to face. The total time spent in the appointment was 30 minutes.  Drue Second, MD Medical/Oncology [facility] [phone] (beeper) [phone] (Office)  06/10/2013, 2:52 PM

## 2013-06-23 IMAGING — CT CT CHEST W/ CM
3 of 4 series · 17 of 30 positions shown, 19 images · IV contrast (75CC OMNI 300)
Comparison: 37/10/03

CLINICAL DATA: Breast cancer.  Evaluate for adenopathy.

CT CHEST WITH CONTRAST
TECHNIQUE: Multidetector CT imaging of the chest was performed
following the standard protocol during bolus administration of
intravenous contrast.
Contrast:  75 ml of omni 300

[Series 3: chest with · axial · 0.70mm/px · z∈[-276,-50]mm · 5 of 69 slices shown, 7 images]
[im 12/69  mediastinal]
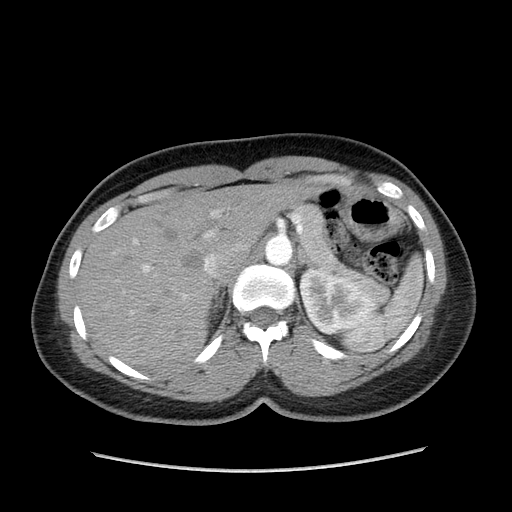
[im 12/69  lung]
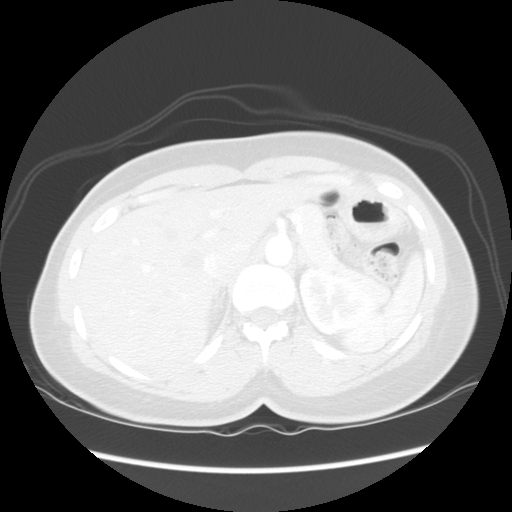
[im 23/69  lung]
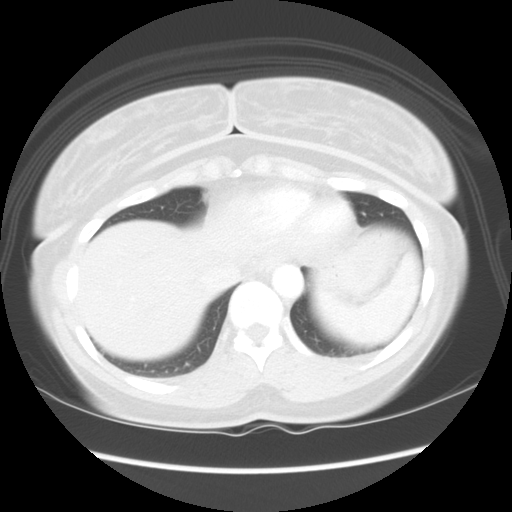
[im 35/69  lung]
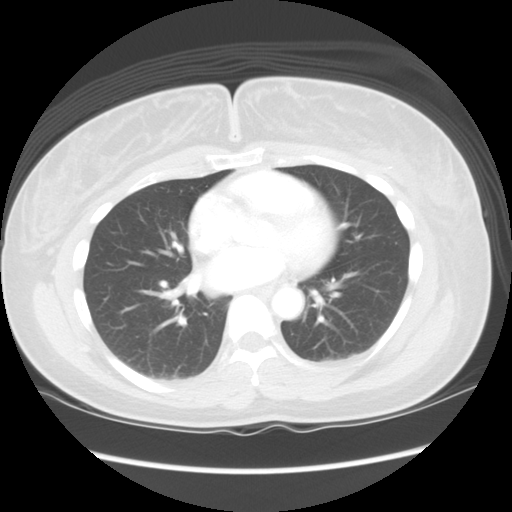
[im 46/69  lung]
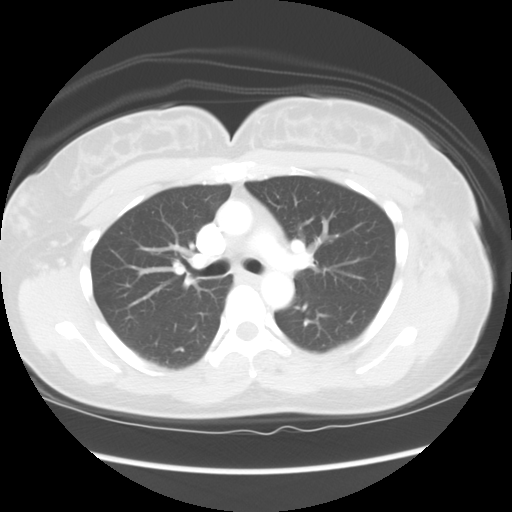
[im 57/69  mediastinal]
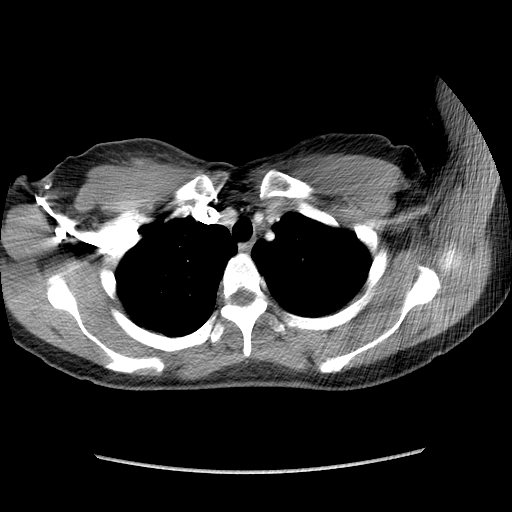
[im 57/69  lung]
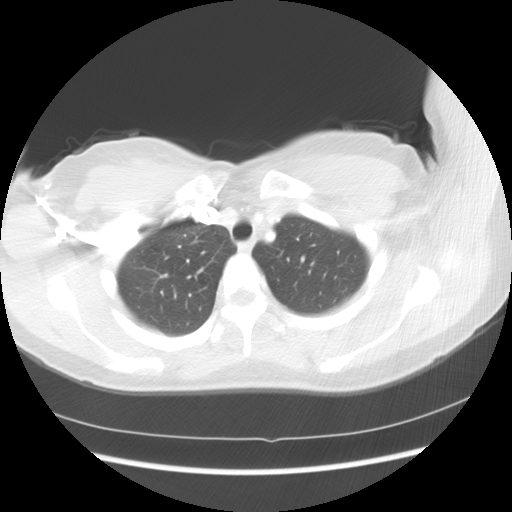

[Series 4: lung windows · axial · 0.70mm/px · z∈[-210,-46]mm · 4 of 55 slices shown]
[im 11/55  lung]
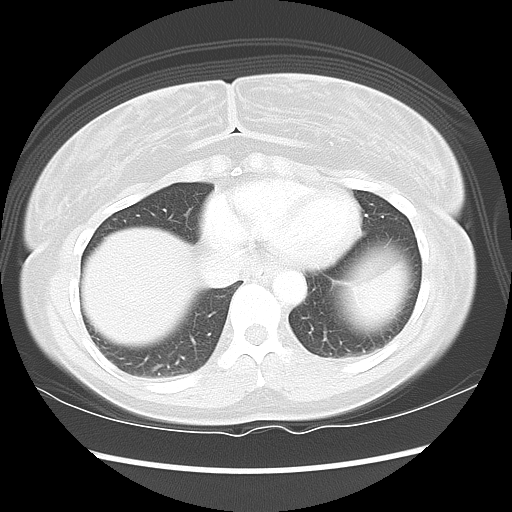
[im 22/55  lung]
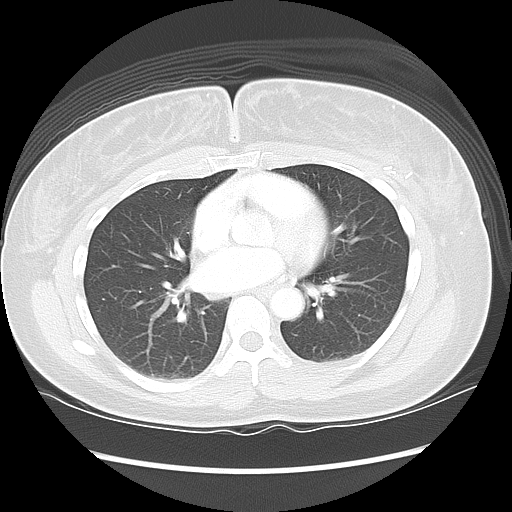
[im 33/55  lung]
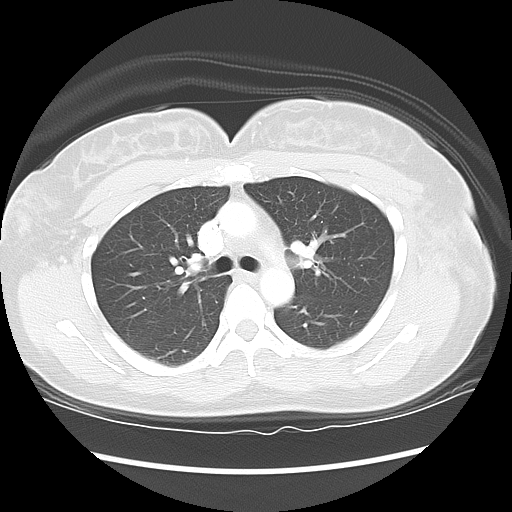
[im 44/55  lung]
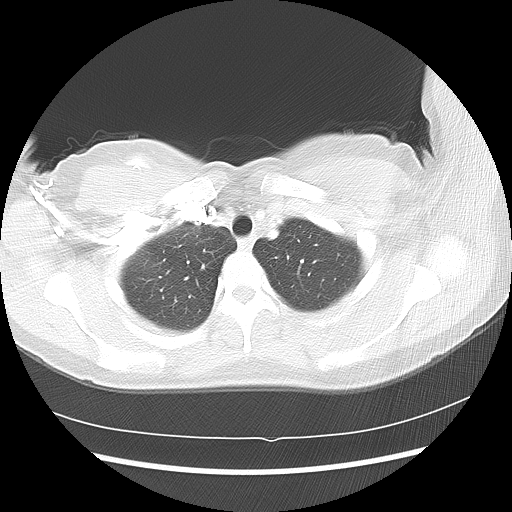

[Series 602: sagittal body · sagittal · 0.70mm/px · 8 of 145 slices shown]
[im 11/145  mediastinal]
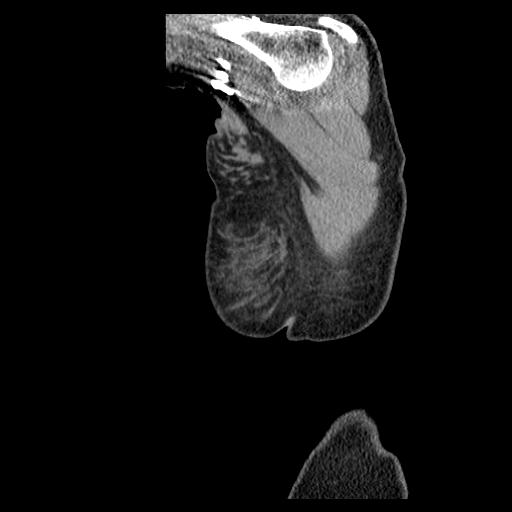
[im 31/145  mediastinal]
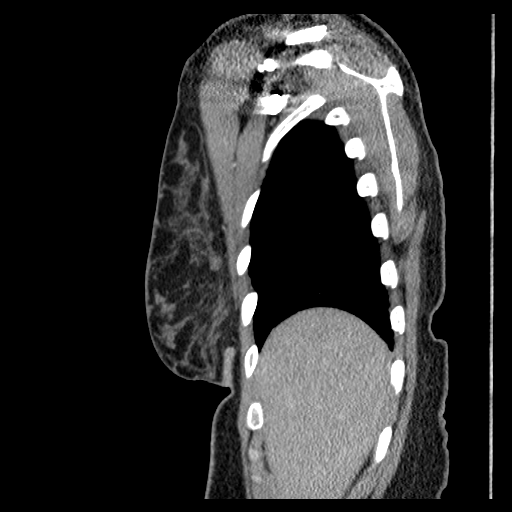
[im 52/145  mediastinal]
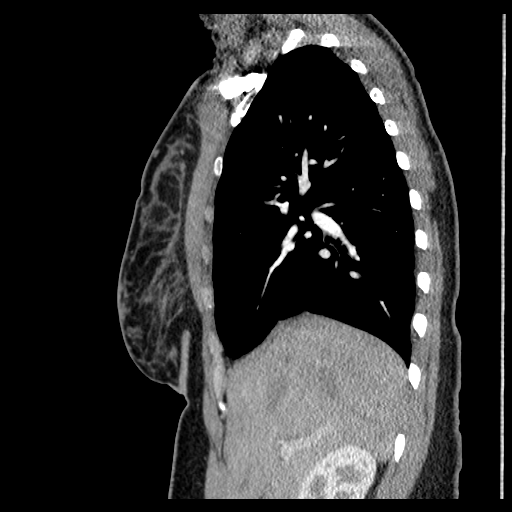
[im 62/145  mediastinal]
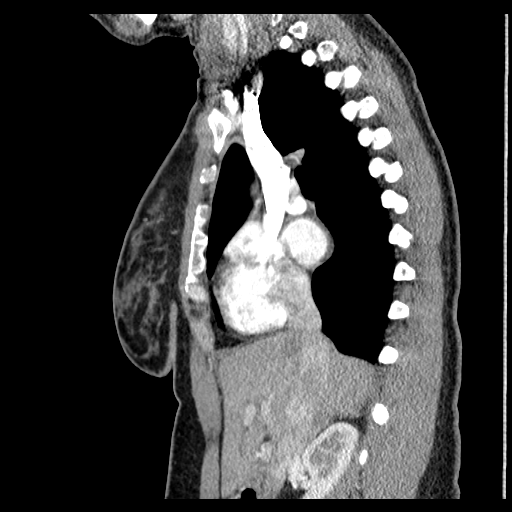
[im 83/145  mediastinal]
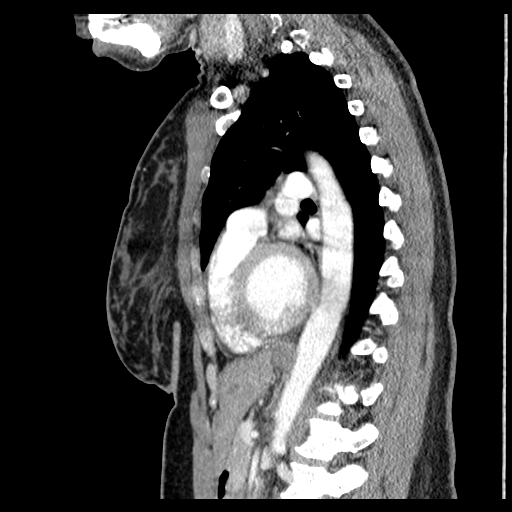
[im 93/145  mediastinal]
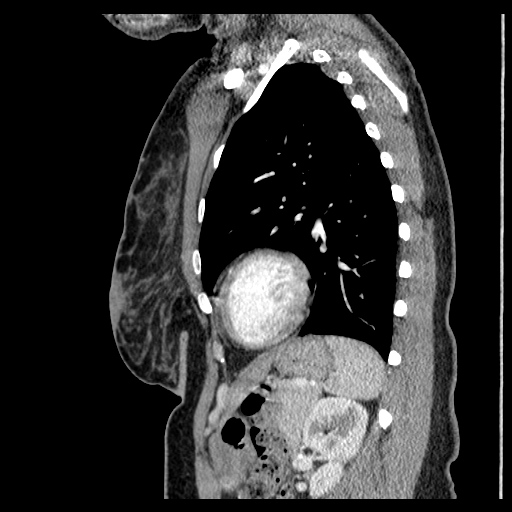
[im 114/145  mediastinal]
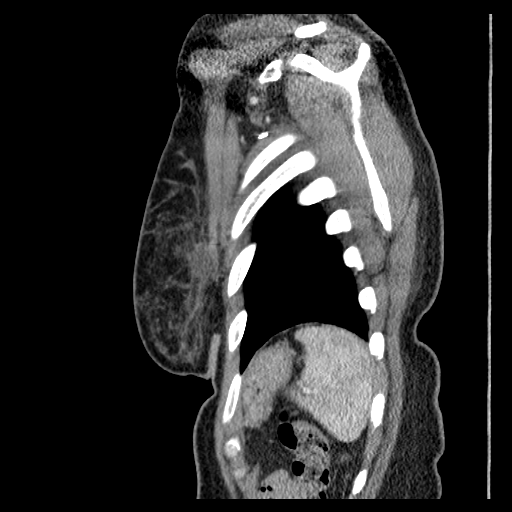
[im 134/145  mediastinal]
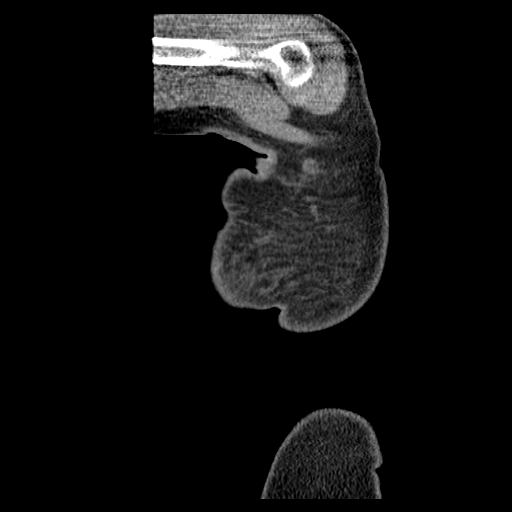

[17 of 30 positions shown; findings below may reference images not displayed]

FINDINGS: Surgical clips are noted within the axillary tail of the left
breast

There are postsurgical changes and fat stranding within the left
axilla.  Enlarged left axillary lymph node is again noted.  This
has a short axis diameter of 1.4 cm, image 30.  Previously 1.6 cm.

No new or progressive axillary adenopathy.  There are no enlarged
right axillary lymph nodes.

No supraclavicular adenopathy.

There is no mediastinal or hilar adenopathy.  No pericardial or
pleural effusion.

The lungs are clear.  No suspicious pulmonary parenchymal nodule or
mass.

The tracheobronchial tree appears normal.

Review of the visualized bony structures is unremarkable.
IMPRESSION: 1.  Residual left axillary lymph node measures 1.4 cm.
2.  Postop changes within the axillary tail of the left breast and
left axilla.

## 2013-07-01 ENCOUNTER — Ambulatory Visit (INDEPENDENT_AMBULATORY_CARE_PROVIDER_SITE_OTHER): Payer: BC Managed Care – PPO | Admitting: General Surgery

## 2013-07-01 ENCOUNTER — Encounter (INDEPENDENT_AMBULATORY_CARE_PROVIDER_SITE_OTHER): Payer: Self-pay | Admitting: General Surgery

## 2013-07-01 VITALS — BP 130/76 | HR 64 | Resp 16 | Ht 67.0 in | Wt 182.4 lb

## 2013-07-01 DIAGNOSIS — L910 Hypertrophic scar: Secondary | ICD-10-CM

## 2013-07-02 NOTE — Progress Notes (Signed)
Subjective:     Patient ID: Alisha Chen, female   DOB: 1966/05/22, 47 y.o.   MRN: [id number]  HPI 33 yof I know from her treatment for breast cancer who comes back again after I recently injected a right chest keloid with kenalog. I have removed her port not too long ago. This has gotten a little softer and a little less raised since our last visit.  She would like to try again to see if improves some more.   Review of Systems     Objective:   Physical Exam Right chest keloid a little softer and less raised, still tender    Assessment:     Right chest keloid     Plan:     We discussed procedure. We then mixed kenalog with marcaine. I cleansed the scar and surrounding area on her right upper chest. I then injected about 30 cc of this mixture throughout the scar. She tolerated this well. A band aid was placed. I will plan on seeing her back in several weeks to attempt again.  If this continues to improve we could try again but if not may refer to see plastics to discuss other options if she desires.

## 2013-08-14 ENCOUNTER — Ambulatory Visit (INDEPENDENT_AMBULATORY_CARE_PROVIDER_SITE_OTHER): Payer: BC Managed Care – PPO | Admitting: General Surgery

## 2013-08-14 ENCOUNTER — Encounter (INDEPENDENT_AMBULATORY_CARE_PROVIDER_SITE_OTHER): Payer: Self-pay | Admitting: General Surgery

## 2013-08-14 VITALS — BP 108/66 | HR 64 | Temp 97.9°F | Resp 14 | Ht 67.0 in | Wt 179.6 lb

## 2013-08-14 DIAGNOSIS — C50419 Malignant neoplasm of upper-outer quadrant of unspecified female breast: Secondary | ICD-10-CM

## 2013-08-14 NOTE — Progress Notes (Signed)
Subjective:     Patient ID: Alisha Chen, female   DOB: 10-Mar-1966, 47 y.o.   MRN: [id number]  HPI 60 yof who I have taken care of for breast cancer.  I removed her right Reserve port and she had keloid that was bothering her.  I have injected kenalog twice with some improvement.  She states it isnt as raised and does not hurt her.  It is not sensitive to touch anymore either. She comes in today to discuss possibly having another injection.  Review of Systems     Objective:   Physical Exam    right chest wall keloid, nontender, flatter Assessment:     History breast cancer Keloid right chest wall    Plan:     We elected not to inject again as she is happy with her current situation.  I will see her back in 6 months for regular follow up or sooner if she has any problems.

## 2013-09-10 ENCOUNTER — Other Ambulatory Visit: Payer: Self-pay | Admitting: Oncology

## 2013-09-10 DIAGNOSIS — Z853 Personal history of malignant neoplasm of breast: Secondary | ICD-10-CM

## 2013-09-10 DIAGNOSIS — Z9889 Other specified postprocedural states: Secondary | ICD-10-CM

## 2013-10-01 ENCOUNTER — Telehealth: Payer: Self-pay | Admitting: Family Medicine

## 2013-10-01 NOTE — Telephone Encounter (Signed)
Called patient and left detailed message stating that according to our records the last Tdap vaccine was giving in 2012 not making her due until 2022. Advised patient that we could give her a copy of her immunizations for her records. SW

## 2013-10-01 NOTE — Telephone Encounter (Signed)
Patient states that she actually needs a TB skin test. Can patient have nurse visit for TB skin test?

## 2013-10-01 NOTE — Telephone Encounter (Signed)
Patient is calling to inquire if she can come in for TDap. She needs this for a new job she is pursuing. Please advise.

## 2013-10-02 ENCOUNTER — Ambulatory Visit (INDEPENDENT_AMBULATORY_CARE_PROVIDER_SITE_OTHER): Payer: BC Managed Care – PPO | Admitting: *Deleted

## 2013-10-02 DIAGNOSIS — Z111 Encounter for screening for respiratory tuberculosis: Secondary | ICD-10-CM

## 2013-10-02 NOTE — Telephone Encounter (Signed)
Patient was advised that she has not been seen in the office for quite a while and need to schedule an CPE with provider. Patient stated that she understood but reall nee this TB skin test for he job that she is trying to get. Patient was put on the nurse schedule for 9:15 but can't actuallt come until this afternoon.

## 2013-10-04 LAB — TB SKIN TEST
Induration: 0 mm
TB Skin Test: NEGATIVE

## 2013-10-14 ENCOUNTER — Ambulatory Visit
Admission: RE | Admit: 2013-10-14 | Discharge: 2013-10-14 | Disposition: A | Discharge status: Home / Self Care | Payer: BC Managed Care – PPO | Source: Ambulatory Visit | Attending: Oncology | Admitting: Oncology

## 2013-10-14 DIAGNOSIS — Z853 Personal history of malignant neoplasm of breast: Secondary | ICD-10-CM

## 2013-10-14 DIAGNOSIS — Z9889 Other specified postprocedural states: Secondary | ICD-10-CM

## 2013-12-13 ENCOUNTER — Telehealth: Payer: Self-pay | Admitting: Oncology

## 2013-12-13 ENCOUNTER — Encounter (INDEPENDENT_AMBULATORY_CARE_PROVIDER_SITE_OTHER): Payer: Self-pay

## 2013-12-13 ENCOUNTER — Encounter: Payer: Self-pay | Admitting: Oncology

## 2013-12-13 ENCOUNTER — Other Ambulatory Visit (HOSPITAL_BASED_OUTPATIENT_CLINIC_OR_DEPARTMENT_OTHER): Payer: BC Managed Care – PPO

## 2013-12-13 ENCOUNTER — Ambulatory Visit (HOSPITAL_BASED_OUTPATIENT_CLINIC_OR_DEPARTMENT_OTHER): Payer: BC Managed Care – PPO | Admitting: Oncology

## 2013-12-13 VITALS — BP 115/79 | HR 82 | Temp 98.0°F | Resp 18 | Ht 67.0 in | Wt 180.5 lb

## 2013-12-13 DIAGNOSIS — C50419 Malignant neoplasm of upper-outer quadrant of unspecified female breast: Secondary | ICD-10-CM

## 2013-12-13 DIAGNOSIS — C773 Secondary and unspecified malignant neoplasm of axilla and upper limb lymph nodes: Secondary | ICD-10-CM

## 2013-12-13 LAB — CBC WITH DIFFERENTIAL/PLATELET
BASO%: 1.2 % (ref 0.0–2.0)
BASOS ABS: 0.1 10*3/uL (ref 0.0–0.1)
EOS%: 2.6 % (ref 0.0–7.0)
Eosinophils Absolute: 0.1 10*3/uL (ref 0.0–0.5)
HEMATOCRIT: 36.5 % (ref 34.8–46.6)
HEMOGLOBIN: 12 g/dL (ref 11.6–15.9)
LYMPH%: 25.3 % (ref 14.0–49.7)
MCH: 28.9 pg (ref 25.1–34.0)
MCHC: 32.8 g/dL (ref 31.5–36.0)
MCV: 87.8 fL (ref 79.5–101.0)
MONO#: 0.3 10*3/uL (ref 0.1–0.9)
MONO%: 5.7 % (ref 0.0–14.0)
NEUT#: 3 10*3/uL (ref 1.5–6.5)
NEUT%: 65.2 % (ref 38.4–76.8)
PLATELETS: 159 10*3/uL (ref 145–400)
RBC: 4.15 10*6/uL (ref 3.70–5.45)
RDW: 12.9 % (ref 11.2–14.5)
WBC: 4.6 10*3/uL (ref 3.9–10.3)
lymph#: 1.2 10*3/uL (ref 0.9–3.3)

## 2013-12-13 LAB — COMPREHENSIVE METABOLIC PANEL (CC13)
ALK PHOS: 108 U/L (ref 40–150)
ALT: 20 U/L (ref 0–55)
AST: 19 U/L (ref 5–34)
Albumin: 4 g/dL (ref 3.5–5.0)
Anion Gap: 8 mEq/L (ref 3–11)
BUN: 19.4 mg/dL (ref 7.0–26.0)
CALCIUM: 10.6 mg/dL — AB (ref 8.4–10.4)
CHLORIDE: 104 meq/L (ref 98–109)
CO2: 29 mEq/L (ref 22–29)
CREATININE: 1.2 mg/dL — AB (ref 0.6–1.1)
Glucose: 79 mg/dl (ref 70–140)
Potassium: 4 mEq/L (ref 3.5–5.1)
Sodium: 140 mEq/L (ref 136–145)
Total Bilirubin: 0.33 mg/dL (ref 0.20–1.20)
Total Protein: 7.2 g/dL (ref 6.4–8.3)

## 2013-12-13 NOTE — Progress Notes (Signed)
OFFICE PROGRESS NOTE  CC  Garnet Koyanagi, DO [phone] [address] [place] [address] Dr. Rolm Bookbinder Dr. 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  PRIOR THERAPY:  #1 S/P left breast lumpectomy that revealed 2 foci of invasive ductal carcinoma measuring 1.3 cm and 0.8 cm grade 2 with adjacent high-grade ductal carcinoma in situ with no evidence of angiolymphatic invasion. The tumor was estrogen receptor +97% progesterone receptor +100% HER-2/neu negative proliferation marker 48% second focus was ER +96% PR +99% HER-2/neu negative with a proliferation marker 74%. 1/22 lymph nodes were examined all of which were positive for metastatic disease  #2  S/P 4 cycles of FEC 100 given between 03/23/2012 2 05/10/2012. She completed all of her therapy and tolerated it reasonably well.  #3 S/P single agent weekly Taxol at 80 mg per meter squared x12 weeks starting 05/23/2012 x 1 cycle  #4 Radiation therapy 07/10/12 - 08/23/12  #4 patient was begun on tamoxifen starting on 09/14/2012. Total of 10 years of therapy is planned.But she discontinued it due to side effects  CURRENT THERAPY: Observation  INTERVAL HISTORY: Alisha Chen 48 y.o. female returns for Followup visit.  She tells me that she stopped taking the tamoxifen because it made her feel bad. She does not want to go back on tamoxifen or anything else.she feels well. She denies any nausea or vomting, she still has some discomfort in the breast site. Remainder of the 10 point review of systems is negative.  MEDICAL HISTORY: Past Medical History  Diagnosis Date  . Back pain   . Hx: UTI (urinary tract infection)   . Status post chemotherapy 1 dose 05/23/12    Stopped after one cycle of Taxol due to Grade 2 Toxicity  . Status post chemotherapy 1  dose 05/23/12    Stopped after one cycle of Taxol due to Grade 2 Toxicity  . Breast cancer     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,dcis  . History of chemotherapy 03/23/12- 05/10/12    s/p 4 cycles of FEC   . S/P radiation therapy 07/10/12-08/23/12    Left Breast: 50 Gy/25 Fractions; Boost: 10 Gy/5 Fractions    ALLERGIES:  is allergic to shellfish-derived products; clarithromycin; ondansetron; and penicillins.  MEDICATIONS:  Current Outpatient Prescriptions  Medication Sig Dispense Refill  . cholecalciferol (VITAMIN D) 1000 UNITS tablet Take 1,000 Units by mouth daily.      . diphenhydrAMINE (BENADRYL) 25 MG tablet Take 25 mg by mouth every 6 (six) hours as needed for itching.      . tamoxifen (NOLVADEX) 20 MG tablet Take 1 tablet (20 mg total) by mouth daily.  90 tablet  12  . vitamin B-12 (CYANOCOBALAMIN) 1000 MCG tablet Take 1,000 mcg by mouth daily.       No current facility-administered medications for this visit.    SURGICAL HISTORY:  Past Surgical History  Procedure Laterality Date  . Spine surgery  01/2011  . Foot surgery      multiple  . Back surgery      LUMBAR DECOMPRESSION  . Portacath placement  02/10/2012    Procedure: INSERTION PORT-A-CATH;  Surgeon: Rolm Bookbinder, MD;  Location: [place];  Service: General;  Laterality: Right;  . Needle core biospy  01/09/12    Left Breast: Invasive  Ductal Carcinoma, Lymph Node Axilla: Metastatic Mammary Carcinoma  . Breast lumpectomy  02/10/12    Left Breast: 2 Foci of Invasive Ductal Caand High grade Ductal Carcinoma In Situ, 0/14 nodes Left Axilla Negative : Regional Resection of Lymph Nodes: 0/5 Nodes Negative  . Regional resection  03/06/12    Left Axilla: 1/3 Nodes Metastatic Carcinoma  . Intrauterine device removal  01/24/12  . Breast biopsy  01/09/2012    left breast 3 0'clock/ER?PR =positive,her 2 neg  . Axillary surgery  03/06/12    left, regional resection, 1/3 nodes pos   Health  Maintenance Mammogram: 09/2011 Colonoscopy: n/a Bone Density Scan:n/a Pap Smear: 01/2012 Eye Exam: n/a Vitamin D Level:n/a Lipid Panel: prior normal   REVIEW OF SYSTEMS:   General: fatigue (-), night sweats (-), fever (-), pain (-) Lymph: palpable nodes (-) HEENT: vision changes (-), mucositis (-), gum bleeding (-), epistaxis (-) Cardiovascular: chest pain (-), palpitations (-) Pulmonary: shortness of breath (-), dyspnea on exertion (-), cough (-), hemoptysis (-) GI:  Early satiety (-), melena (-), dysphagia (-), nausea/vomiting (-), diarrhea (-) GU: dysuria (-), hematuria (-), incontinence (-) Musculoskeletal: joint swelling (-), joint pain (-), back pain (-) Neuro: weakness (-), numbness (-), headache (-), confusion (-) Skin: Rash (-), lesions (-), dryness (-) Psych: depression (-), suicidal/homicidal ideation (-), feeling of hopelessness (-)  PHYSICAL EXAMINATION:  BP 115/79  Pulse 82  Temp(Src) 98 F (36.7 C) (Oral)  Resp 18  Ht 5' 7"  (1.702 m)  Wt 180 lb 8 oz (81.874 kg)  BMI 28.26 kg/m2 General: Patient is a well appearing female in no acute distress HEENT: PERRLA, sclerae anicteric no conjunctival pallor, MMM Neck: supple, no palpable adenopathy Lungs: clear to auscultation bilaterally, no wheezes, rhonchi, or rales Cardiovascular: regular rate rhythm, S1, S2, no murmurs, rubs or gallops Abdomen: Soft, non-tender, non-distended, normoactive bowel sounds, no HSM Extremities: warm and well perfused, no clubbing, cyanosis, or edema Skin: No rashes or lesions Neuro: Non-focal Breasts: right breast no masses, or skin changes, left breast lumpectomy scar well healed, no nodularity or sign of recurrence ECOG PERFORMANCE STATUS: 2    LABORATORY DATA: Lab Results  Component Value Date   WBC 4.6 12/13/2013   HGB 12.0 12/13/2013   HCT 36.5 12/13/2013   MCV 87.8 12/13/2013   PLT 159 12/13/2013      Chemistry      Component Value Date/Time   NA 140 06/10/2013 1411   NA  140 05/29/2012 1501   K 4.2 06/10/2013 1411   K 4.4 05/29/2012 1501   CL 105 12/21/2012 1527   CL 105 05/29/2012 1501   CO2 28 06/10/2013 1411   CO2 28 05/29/2012 1501   BUN 19.6 06/10/2013 1411   BUN 13 05/29/2012 1501   CREATININE 1.0 06/10/2013 1411   CREATININE 0.84 05/29/2012 1501      Component Value Date/Time   CALCIUM 10.8* 06/10/2013 1411   CALCIUM 10.8* 05/29/2012 1501   ALKPHOS 116 06/10/2013 1411   ALKPHOS 77 05/29/2012 1501   AST 18 06/10/2013 1411   AST 18 05/29/2012 1501   ALT 23 06/10/2013 1411   ALT 12 05/29/2012 1501   BILITOT 0.34 06/10/2013 1411   BILITOT 0.2* 05/29/2012 1501       RADIOGRAPHIC STUDIES:  ASSESSMENT/PLAN: 48 year old female with:  1.  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 The primary tumor revealed 2 foci measuring 1.3 and 0.8 cm.with 1/22 LN positive for  metastatic disease. She has now completed 4 cycles of FEC 100. She overall tolerated this well.She began single agent Taxol on 05/23/2012. But discontinued to due to side effects.  #2 Patient is declined any further chemotherapy.  #3 S/P adjuvant Radiation completed 07/10/12 - 08/23/12  #4 patient was begun on tamoxifen 20 mg daily beginning 09/14/2012.she has discontinued it on her own  #5 she will return in 1 year for follow up    I spent 20 minutes counseling the patient face to face. The total time spent in the appointment was 30 minutes.  Marcy Panning, MD Medical/Oncology [facility] [phone] (beeper) [phone] (Office)  12/13/2013, 2:58 PM

## 2014-03-05 ENCOUNTER — Ambulatory Visit (INDEPENDENT_AMBULATORY_CARE_PROVIDER_SITE_OTHER): Payer: BC Managed Care – PPO | Admitting: General Surgery

## 2014-03-20 ENCOUNTER — Ambulatory Visit (INDEPENDENT_AMBULATORY_CARE_PROVIDER_SITE_OTHER): Payer: BC Managed Care – PPO | Admitting: General Surgery

## 2014-03-20 ENCOUNTER — Encounter (INDEPENDENT_AMBULATORY_CARE_PROVIDER_SITE_OTHER): Payer: Self-pay | Admitting: General Surgery

## 2014-03-20 VITALS — BP 122/81 | HR 68 | Temp 98.1°F | Resp 18 | Ht 67.0 in | Wt 180.4 lb

## 2014-03-20 DIAGNOSIS — C50419 Malignant neoplasm of upper-outer quadrant of unspecified female breast: Secondary | ICD-10-CM

## 2014-03-20 NOTE — Patient Instructions (Signed)

## 2014-03-20 NOTE — Progress Notes (Signed)
Subjective:     Patient ID: Alisha Chen, female   DOB: Aug 28, 1966, 48 y.o.   MRN: [id number]  HPI 64 yof s/p left breast lumpectomy/alnd for 2 separate left breast tumors that were hr positive and her2 negative.  She underwent chemo/rt.  She had issues with scar after I removed her port and I injected with kenalog a couple times leading to this being more flat and less painful.  She is otherwise doing well without any complaints.  She is up to date with mm  Review of Systems     Objective:   Physical Exam  Vitals reviewed. Constitutional: She appears well-developed and well-nourished.  Pulmonary/Chest: Right breast exhibits no inverted nipple, no mass, no nipple discharge, no skin change and no tenderness. Left breast exhibits no inverted nipple, no mass, no nipple discharge, no skin change and no tenderness.    Lymphadenopathy:    She has no cervical adenopathy.    She has no axillary adenopathy.       Right: No supraclavicular adenopathy present.       Left: No supraclavicular adenopathy present.       Assessment:     Stage II breast cancer Keloid    Plan:     1. She has no clinical evidence of recurrence.  I will see her back in one year for f/u 2. I will refer her to Dr Migdalia Dk of plastic surgery for evaluation of keloid

## 2014-03-21 ENCOUNTER — Telehealth (INDEPENDENT_AMBULATORY_CARE_PROVIDER_SITE_OTHER): Payer: Self-pay | Admitting: *Deleted

## 2014-03-21 NOTE — Addendum Note (Signed)
Addended by: Illene Regulus on: 03/21/2014 10:09 AM   Modules accepted: Orders

## 2014-03-21 NOTE — Telephone Encounter (Signed)
LM for pt to return my call.  Please advise pt of her appt with Dr. Migdalia Dk (plastic surgeon) for 04-01-14 @ 9:00.  Address is: [address]  Phone # [phone].  Thanks!  Anderson Malta

## 2014-03-26 NOTE — Telephone Encounter (Signed)
Received call back from pt and I advised her of the appt below.  Pt understands and is in agreeance at this time.   Anderson Malta

## 2014-07-08 ENCOUNTER — Telehealth: Payer: Self-pay | Admitting: Hematology and Oncology

## 2014-07-08 NOTE — Telephone Encounter (Signed)
pt cld to r/s appt-r/s & gave pt time & date

## 2014-08-14 ENCOUNTER — Ambulatory Visit (INDEPENDENT_AMBULATORY_CARE_PROVIDER_SITE_OTHER): Payer: BC Managed Care – PPO | Admitting: Family Medicine

## 2014-08-14 ENCOUNTER — Encounter: Payer: Self-pay | Admitting: Family Medicine

## 2014-08-14 VITALS — BP 121/83 | HR 63 | Temp 98.0°F | Ht 66.0 in | Wt 187.2 lb

## 2014-08-14 DIAGNOSIS — M25539 Pain in unspecified wrist: Secondary | ICD-10-CM

## 2014-08-14 DIAGNOSIS — Z0000 Encounter for general adult medical examination without abnormal findings: Secondary | ICD-10-CM

## 2014-08-14 DIAGNOSIS — M25532 Pain in left wrist: Secondary | ICD-10-CM

## 2014-08-14 LAB — LIPID PANEL
CHOLESTEROL: 176 mg/dL (ref 0–200)
HDL: 55.6 mg/dL (ref >39.00)
LDL Cholesterol: 111 mg/dL — ABNORMAL HIGH (ref 0–99)
NonHDL: 120.4
TRIGLYCERIDES: 48 mg/dL (ref 0.0–149.0)
Total CHOL/HDL Ratio: 3
VLDL: 9.6 mg/dL (ref 0.0–40.0)

## 2014-08-14 LAB — POCT URINALYSIS DIPSTICK
BILIRUBIN UA: NEGATIVE
Blood, UA: NEGATIVE
GLUCOSE UA: NEGATIVE
KETONES UA: NEGATIVE
LEUKOCYTES UA: NEGATIVE
Nitrite, UA: NEGATIVE
Protein, UA: NEGATIVE
SPEC GRAV UA: 1.015
Urobilinogen, UA: 0.2
pH, UA: 6

## 2014-08-14 LAB — CBC WITH DIFFERENTIAL/PLATELET
BASOS PCT: 0.5 % (ref 0.0–3.0)
Basophils Absolute: 0 10*3/uL (ref 0.0–0.1)
EOS PCT: 2.5 % (ref 0.0–5.0)
Eosinophils Absolute: 0.1 10*3/uL (ref 0.0–0.7)
HEMATOCRIT: 37.1 % (ref 36.0–46.0)
HEMOGLOBIN: 12.3 g/dL (ref 12.0–15.0)
LYMPHS ABS: 0.9 10*3/uL (ref 0.7–4.0)
Lymphocytes Relative: 21.9 % (ref 12.0–46.0)
MCHC: 33.1 g/dL (ref 30.0–36.0)
MCV: 86 fl (ref 78.0–100.0)
MONO ABS: 0.2 10*3/uL (ref 0.1–1.0)
Monocytes Relative: 5.5 % (ref 3.0–12.0)
NEUTROS ABS: 2.7 10*3/uL (ref 1.4–7.7)
Neutrophils Relative %: 69.6 % (ref 43.0–77.0)
PLATELETS: 168 10*3/uL (ref 150.0–400.0)
RBC: 4.32 Mil/uL (ref 3.87–5.11)
RDW: 13.4 % (ref 11.5–15.5)
WBC: 4 10*3/uL (ref 4.0–10.5)

## 2014-08-14 LAB — HEPATIC FUNCTION PANEL
ALBUMIN: 4.1 g/dL (ref 3.5–5.2)
ALT: 18 U/L (ref 0–35)
AST: 19 U/L (ref 0–37)
Alkaline Phosphatase: 110 U/L (ref 39–117)
BILIRUBIN TOTAL: 0.4 mg/dL (ref 0.2–1.2)
Bilirubin, Direct: 0.1 mg/dL (ref 0.0–0.3)
TOTAL PROTEIN: 7.6 g/dL (ref 6.0–8.3)

## 2014-08-14 LAB — BASIC METABOLIC PANEL
BUN: 15 mg/dL (ref 6–23)
CHLORIDE: 104 meq/L (ref 96–112)
CO2: 29 mEq/L (ref 19–32)
Calcium: 10.6 mg/dL — ABNORMAL HIGH (ref 8.4–10.5)
Creatinine, Ser: 0.9 mg/dL (ref 0.4–1.2)
GFR: 83.36 mL/min (ref >60.00)
Glucose, Bld: 78 mg/dL (ref 70–99)
POTASSIUM: 4.6 meq/L (ref 3.5–5.1)
Sodium: 139 mEq/L (ref 135–145)

## 2014-08-14 LAB — TSH: TSH: 3.5 u[IU]/mL (ref 0.35–4.50)

## 2014-08-14 NOTE — Progress Notes (Signed)
Subjective:     Alisha Chen is a 48 y.o. female and is here for a comprehensive physical exam. The patient reports problems - pain in L wrist--no injury.  Had ganglion cyst in R hand sevral years ago.Marland Kitchen  History   Social History  . Marital Status: Married    Spouse Name: N/A    Number of Children: 1  . Years of Education: N/A   Occupational History  . teacher     [employer]  .  [place] History Main Topics  . Smoking status: Never Smoker   . Smokeless tobacco: Never Used  . Alcohol Use: No  . Drug Use: No  . Sexual Activity: Yes    Partners: Male     Comment: menarche age 32,premenopausa; G!P1,1st pregnancy age 80   Other Topics Concern  . Not on file   Social History Narrative   Married x 21 years. Production assistant, radio at [employer]. 24 ten year old son   Health Maintenance  Topic Date Due  . Influenza Vaccine  10/14/2014 (Originally 06/21/2014)  . Mammogram  10/14/2014  . Pap Smear  05/21/2017  . Tetanus/tdap  09/19/2021    The following portions of the patient's history were reviewed and updated as appropriate:  She  has a past medical history of Back pain; UTI (urinary tract infection); Status post chemotherapy (1 dose 05/23/12); Status post chemotherapy (1 dose 05/23/12); Breast cancer; History of chemotherapy (03/23/12- 05/10/12); and S/P radiation therapy (07/10/12-08/23/12). She  does not have any pertinent problems on file. She  has past surgical history that includes Spine surgery (01/2011); Foot surgery; Back surgery; Portacath placement (02/10/2012); needle core Biospy (01/09/12); Breast lumpectomy (02/10/12); Regional Resection (03/06/12); Intrauterine device removal (01/24/12); Breast biopsy (01/09/2012); and Axillary Surgery (03/06/12). Her family history includes Diabetes in her mother; Seizures in her son. She  reports that she has never smoked. She has never used smokeless tobacco. She reports that she does not drink alcohol or use illicit  drugs. She has a current medication list which includes the following prescription(s): cholecalciferol, diphenhydramine, and vitamin b-12. Current Outpatient Prescriptions on File Prior to Visit  Medication Sig Dispense Refill  . cholecalciferol (VITAMIN D) 1000 UNITS tablet Take 1,000 Units by mouth daily.      . diphenhydrAMINE (BENADRYL) 25 MG tablet Take 25 mg by mouth every 6 (six) hours as needed for itching.      . vitamin B-12 (CYANOCOBALAMIN) 1000 MCG tablet Take 1,000 mcg by mouth daily.       No current facility-administered medications on file prior to visit.   She is allergic to shellfish-derived products; clarithromycin; ondansetron; and penicillins..  Review of Systems Review of Systems  Constitutional: Negative for activity change, appetite change and fatigue.  HENT: Negative for hearing loss, congestion, tinnitus and ear discharge.  dentist q34m Eyes: Negative for visual disturbance (see optho q1y -- vision corrected to 20/20 with glasses).  Respiratory: Negative for cough, chest tightness and shortness of breath.   Cardiovascular: Negative for chest pain, palpitations and leg swelling.  Gastrointestinal: Negative for abdominal pain, diarrhea, constipation and abdominal distention.  Genitourinary: Negative for urgency, frequency, decreased urine volume and difficulty urinating.  Musculoskeletal: Negative for back pain, arthralgias and gait problem.  Skin: Negative for color change, pallor and rash.  Neurological: Negative for dizziness, light-headedness, numbness and headaches.  Hematological: Negative for adenopathy. Does not bruise/bleed easily.  Psychiatric/Behavioral: Negative for suicidal ideas, confusion, sleep disturbance, self-injury, dysphoric mood, decreased  concentration and agitation.       Objective:    BP 121/83  Pulse 63  Temp(Src) 98 F (36.7 C) (Oral)  Ht 5\' 6"  (1.676 m)  Wt 187 lb 2.7 oz (84.9 kg)  BMI 30.22 kg/m2  SpO2 100% General  appearance: alert, cooperative, appears stated age and no distress Head: Normocephalic, without obvious abnormality, atraumatic Eyes: conjunctivae/corneas clear. PERRL, EOM's intact. Fundi benign. Ears: normal TM's and external ear canals both ears Nose: Nares normal. Septum midline. Mucosa normal. No drainage or sinus tenderness. Throat: lips, mucosa, and tongue normal; teeth and gums normal Neck: no adenopathy, no carotid bruit, no JVD, supple, symmetrical, trachea midline and thyroid not enlarged, symmetric, no tenderness/mass/nodules Back: symmetric, no curvature. ROM normal. No CVA tenderness. Lungs: clear to auscultation bilaterally Breasts: normal appearance, no masses or tenderness Heart: regular rate and rhythm, S1, S2 normal, no murmur, click, rub or gallop Abdomen: soft, non-tender; bowel sounds normal; no masses,  no organomegaly Pelvic: cervix normal in appearance, external genitalia normal, no adnexal masses or tenderness, no cervical motion tenderness, rectovaginal septum normal, uterus normal size, shape, and consistency, vagina normal without discharge and pap done Extremities: extremities normal, atraumatic, no cyanosis or edema Pulses: 2+ and symmetric Skin: Skin color, texture, turgor normal. No rashes or lesions Lymph nodes: Cervical, supraclavicular, and axillary nodes normal. Neurologic: Alert and oriented X 3, normal strength and tone. Normal symmetric reflexes. Normal coordination and gait Psych-- no depression. anxiety      Assessment:    Healthy female exam.      Plan:     1. Left wrist pain  - Ambulatory referral to Sports Medicine  2. Preventative health care ghm utd, check labs - Basic metabolic panel - CBC with Differential - Hepatic function panel - Lipid panel - POCT urinalysis dipstick - TSH  See After Visit Summary for Counseling Recommendations

## 2014-08-14 NOTE — Patient Instructions (Signed)
Preventive Care for Adults A healthy lifestyle and preventive care can promote health and wellness. Preventive health guidelines for women include the following key practices.  A routine yearly physical is a good way to check with your health care provider about your health and preventive screening. It is a chance to share any concerns and updates on your health and to receive a thorough exam.  Visit your dentist for a routine exam and preventive care every 6 months. Brush your teeth twice a day and floss once a day. Good oral hygiene prevents tooth decay and gum disease.  The frequency of eye exams is based on your age, health, family medical history, use of contact lenses, and other factors. Follow your health care provider's recommendations for frequency of eye exams.  Eat a healthy diet. Foods like vegetables, fruits, whole grains, low-fat dairy products, and lean protein foods contain the nutrients you need without too many calories. Decrease your intake of foods high in solid fats, added sugars, and salt. Eat the right amount of calories for you.Get information about a proper diet from your health care provider, if necessary.  Regular physical exercise is one of the most important things you can do for your health. Most adults should get at least 150 minutes of moderate-intensity exercise (any activity that increases your heart rate and causes you to sweat) each week. In addition, most adults need muscle-strengthening exercises on 2 or more days a week.  Maintain a healthy weight. The body mass index (BMI) is a screening tool to identify possible weight problems. It provides an estimate of body fat based on height and weight. Your health care provider can find your BMI and can help you achieve or maintain a healthy weight.For adults 20 years and older:  A BMI below 18.5 is considered underweight.  A BMI of 18.5 to 24.9 is normal.  A BMI of 25 to 29.9 is considered overweight.  A BMI of  30 and above is considered obese.  Maintain normal blood lipids and cholesterol levels by exercising and minimizing your intake of saturated fat. Eat a balanced diet with plenty of fruit and vegetables. Blood tests for lipids and cholesterol should begin at age 76 and be repeated every 5 years. If your lipid or cholesterol levels are high, you are over 50, or you are at high risk for heart disease, you may need your cholesterol levels checked more frequently.Ongoing high lipid and cholesterol levels should be treated with medicines if diet and exercise are not working.  If you smoke, find out from your health care provider how to quit. If you do not use tobacco, do not start.  Lung cancer screening is recommended for adults aged 22-80 years who are at high risk for developing lung cancer because of a history of smoking. A yearly low-dose CT scan of the lungs is recommended for people who have at least a 30-pack-year history of smoking and are a current smoker or have quit within the past 15 years. A pack year of smoking is smoking an average of 1 pack of cigarettes a day for 1 year (for example: 1 pack a day for 30 years or 2 packs a day for 15 years). Yearly screening should continue until the smoker has stopped smoking for at least 15 years. Yearly screening should be stopped for people who develop a health problem that would prevent them from having lung cancer treatment.  If you are pregnant, do not drink alcohol. If you are breastfeeding,  be very cautious about drinking alcohol. If you are not pregnant and choose to drink alcohol, do not have more than 1 drink per day. One drink is considered to be 12 ounces (355 mL) of beer, 5 ounces (148 mL) of wine, or 1.5 ounces (44 mL) of liquor.  Avoid use of street drugs. Do not share needles with anyone. Ask for help if you need support or instructions about stopping the use of drugs.  High blood pressure causes heart disease and increases the risk of  stroke. Your blood pressure should be checked at least every 1 to 2 years. Ongoing high blood pressure should be treated with medicines if weight loss and exercise do not work.  If you are 75-52 years old, ask your health care provider if you should take aspirin to prevent strokes.  Diabetes screening involves taking a blood sample to check your fasting blood sugar level. This should be done once every 3 years, after age 15, if you are within normal weight and without risk factors for diabetes. Testing should be considered at a younger age or be carried out more frequently if you are overweight and have at least 1 risk factor for diabetes.  Breast cancer screening is essential preventive care for women. You should practice "breast self-awareness." This means understanding the normal appearance and feel of your breasts and may include breast self-examination. Any changes detected, no matter how small, should be reported to a health care provider. Women in their 58s and 30s should have a clinical breast exam (CBE) by a health care provider as part of a regular health exam every 1 to 3 years. After age 16, women should have a CBE every year. Starting at age 53, women should consider having a mammogram (breast X-ray test) every year. Women who have a family history of breast cancer should talk to their health care provider about genetic screening. Women at a high risk of breast cancer should talk to their health care providers about having an MRI and a mammogram every year.  Breast cancer gene (BRCA)-related cancer risk assessment is recommended for women who have family members with BRCA-related cancers. BRCA-related cancers include breast, ovarian, tubal, and peritoneal cancers. Having family members with these cancers may be associated with an increased risk for harmful changes (mutations) in the breast cancer genes BRCA1 and BRCA2. Results of the assessment will determine the need for genetic counseling and  BRCA1 and BRCA2 testing.  Routine pelvic exams to screen for cancer are no longer recommended for nonpregnant women who are considered low risk for cancer of the pelvic organs (ovaries, uterus, and vagina) and who do not have symptoms. Ask your health care provider if a screening pelvic exam is right for you.  If you have had past treatment for cervical cancer or a condition that could lead to cancer, you need Pap tests and screening for cancer for at least 20 years after your treatment. If Pap tests have been discontinued, your risk factors (such as having a new sexual partner) need to be reassessed to determine if screening should be resumed. Some women have medical problems that increase the chance of getting cervical cancer. In these cases, your health care provider may recommend more frequent screening and Pap tests.  The HPV test is an additional test that may be used for cervical cancer screening. The HPV test looks for the virus that can cause the cell changes on the cervix. The cells collected during the Pap test can be  tested for HPV. The HPV test could be used to screen women aged 30 years and older, and should be used in women of any age who have unclear Pap test results. After the age of 30, women should have HPV testing at the same frequency as a Pap test.  Colorectal cancer can be detected and often prevented. Most routine colorectal cancer screening begins at the age of 50 years and continues through age 75 years. However, your health care provider may recommend screening at an earlier age if you have risk factors for colon cancer. On a yearly basis, your health care provider may provide home test kits to check for hidden blood in the stool. Use of a small camera at the end of a tube, to directly examine the colon (sigmoidoscopy or colonoscopy), can detect the earliest forms of colorectal cancer. Talk to your health care provider about this at age 50, when routine screening begins. Direct  exam of the colon should be repeated every 5-10 years through age 75 years, unless early forms of pre-cancerous polyps or small growths are found.  People who are at an increased risk for hepatitis B should be screened for this virus. You are considered at high risk for hepatitis B if:  You were born in a country where hepatitis B occurs often. Talk with your health care provider about which countries are considered high risk.  Your parents were born in a high-risk country and you have not received a shot to protect against hepatitis B (hepatitis B vaccine).  You have HIV or AIDS.  You use needles to inject street drugs.  You live with, or have sex with, someone who has hepatitis B.  You get hemodialysis treatment.  You take certain medicines for conditions like cancer, organ transplantation, and autoimmune conditions.  Hepatitis C blood testing is recommended for all people born from 1945 through 1965 and any individual with known risks for hepatitis C.  Practice safe sex. Use condoms and avoid high-risk sexual practices to reduce the spread of sexually transmitted infections (STIs). STIs include gonorrhea, chlamydia, syphilis, trichomonas, herpes, HPV, and human immunodeficiency virus (HIV). Herpes, HIV, and HPV are viral illnesses that have no cure. They can result in disability, cancer, and death.  You should be screened for sexually transmitted illnesses (STIs) including gonorrhea and chlamydia if:  You are sexually active and are younger than 24 years.  You are older than 24 years and your health care provider tells you that you are at risk for this type of infection.  Your sexual activity has changed since you were last screened and you are at an increased risk for chlamydia or gonorrhea. Ask your health care provider if you are at risk.  If you are at risk of being infected with HIV, it is recommended that you take a prescription medicine daily to prevent HIV infection. This is  called preexposure prophylaxis (PrEP). You are considered at risk if:  You are a heterosexual woman, are sexually active, and are at increased risk for HIV infection.  You take drugs by injection.  You are sexually active with a partner who has HIV.  Talk with your health care provider about whether you are at high risk of being infected with HIV. If you choose to begin PrEP, you should first be tested for HIV. You should then be tested every 3 months for as long as you are taking PrEP.  Osteoporosis is a disease in which the bones lose minerals and strength   with aging. This can result in serious bone fractures or breaks. The risk of osteoporosis can be identified using a bone density scan. Women ages 65 years and over and women at risk for fractures or osteoporosis should discuss screening with their health care providers. Ask your health care provider whether you should take a calcium supplement or vitamin D to reduce the rate of osteoporosis.  Menopause can be associated with physical symptoms and risks. Hormone replacement therapy is available to decrease symptoms and risks. You should talk to your health care provider about whether hormone replacement therapy is right for you.  Use sunscreen. Apply sunscreen liberally and repeatedly throughout the day. You should seek shade when your shadow is shorter than you. Protect yourself by wearing long sleeves, pants, a wide-brimmed hat, and sunglasses year round, whenever you are outdoors.  Once a month, do a whole body skin exam, using a mirror to look at the skin on your back. Tell your health care provider of new moles, moles that have irregular borders, moles that are larger than a pencil eraser, or moles that have changed in shape or color.  Stay current with required vaccines (immunizations).  Influenza vaccine. All adults should be immunized every year.  Tetanus, diphtheria, and acellular pertussis (Td, Tdap) vaccine. Pregnant women should  receive 1 dose of Tdap vaccine during each pregnancy. The dose should be obtained regardless of the length of time since the last dose. Immunization is preferred during the 27th-36th week of gestation. An adult who has not previously received Tdap or who does not know her vaccine status should receive 1 dose of Tdap. This initial dose should be followed by tetanus and diphtheria toxoids (Td) booster doses every 10 years. Adults with an unknown or incomplete history of completing a 3-dose immunization series with Td-containing vaccines should begin or complete a primary immunization series including a Tdap dose. Adults should receive a Td booster every 10 years.  Varicella vaccine. An adult without evidence of immunity to varicella should receive 2 doses or a second dose if she has previously received 1 dose. Pregnant females who do not have evidence of immunity should receive the first dose after pregnancy. This first dose should be obtained before leaving the health care facility. The second dose should be obtained 4-8 weeks after the first dose.  Human papillomavirus (HPV) vaccine. Females aged 13-26 years who have not received the vaccine previously should obtain the 3-dose series. The vaccine is not recommended for use in pregnant females. However, pregnancy testing is not needed before receiving a dose. If a female is found to be pregnant after receiving a dose, no treatment is needed. In that case, the remaining doses should be delayed until after the pregnancy. Immunization is recommended for any person with an immunocompromised condition through the age of 26 years if she did not get any or all doses earlier. During the 3-dose series, the second dose should be obtained 4-8 weeks after the first dose. The third dose should be obtained 24 weeks after the first dose and 16 weeks after the second dose.  Zoster vaccine. One dose is recommended for adults aged 60 years or older unless certain conditions are  present.  Measles, mumps, and rubella (MMR) vaccine. Adults born before 1957 generally are considered immune to measles and mumps. Adults born in 1957 or later should have 1 or more doses of MMR vaccine unless there is a contraindication to the vaccine or there is laboratory evidence of immunity to   each of the three diseases. A routine second dose of MMR vaccine should be obtained at least 28 days after the first dose for students attending postsecondary schools, health care workers, or international travelers. People who received inactivated measles vaccine or an unknown type of measles vaccine during 1963-1967 should receive 2 doses of MMR vaccine. People who received inactivated mumps vaccine or an unknown type of mumps vaccine before 1979 and are at high risk for mumps infection should consider immunization with 2 doses of MMR vaccine. For females of childbearing age, rubella immunity should be determined. If there is no evidence of immunity, females who are not pregnant should be vaccinated. If there is no evidence of immunity, females who are pregnant should delay immunization until after pregnancy. Unvaccinated health care workers born before 1957 who lack laboratory evidence of measles, mumps, or rubella immunity or laboratory confirmation of disease should consider measles and mumps immunization with 2 doses of MMR vaccine or rubella immunization with 1 dose of MMR vaccine.  Pneumococcal 13-valent conjugate (PCV13) vaccine. When indicated, a person who is uncertain of her immunization history and has no record of immunization should receive the PCV13 vaccine. An adult aged 19 years or older who has certain medical conditions and has not been previously immunized should receive 1 dose of PCV13 vaccine. This PCV13 should be followed with a dose of pneumococcal polysaccharide (PPSV23) vaccine. The PPSV23 vaccine dose should be obtained at least 8 weeks after the dose of PCV13 vaccine. An adult aged 19  years or older who has certain medical conditions and previously received 1 or more doses of PPSV23 vaccine should receive 1 dose of PCV13. The PCV13 vaccine dose should be obtained 1 or more years after the last PPSV23 vaccine dose.  Pneumococcal polysaccharide (PPSV23) vaccine. When PCV13 is also indicated, PCV13 should be obtained first. All adults aged 65 years and older should be immunized. An adult younger than age 65 years who has certain medical conditions should be immunized. Any person who resides in a nursing home or long-term care facility should be immunized. An adult smoker should be immunized. People with an immunocompromised condition and certain other conditions should receive both PCV13 and PPSV23 vaccines. People with human immunodeficiency virus (HIV) infection should be immunized as soon as possible after diagnosis. Immunization during chemotherapy or radiation therapy should be avoided. Routine use of PPSV23 vaccine is not recommended for American Indians, [place] Natives, or people younger than 65 years unless there are medical conditions that require PPSV23 vaccine. When indicated, people who have unknown immunization and have no record of immunization should receive PPSV23 vaccine. One-time revaccination 5 years after the first dose of PPSV23 is recommended for people aged 19-64 years who have chronic kidney failure, nephrotic syndrome, asplenia, or immunocompromised conditions. People who received 1-2 doses of PPSV23 before age 65 years should receive another dose of PPSV23 vaccine at age 65 years or later if at least 5 years have passed since the previous dose. Doses of PPSV23 are not needed for people immunized with PPSV23 at or after age 65 years.  Meningococcal vaccine. Adults with asplenia or persistent complement component deficiencies should receive 2 doses of quadrivalent meningococcal conjugate (MenACWY-D) vaccine. The doses should be obtained at least 2 months apart.  Microbiologists working with certain meningococcal bacteria, military recruits, people at risk during an outbreak, and people who travel to or live in countries with a high rate of meningitis should be immunized. A first-year college student up through age   21 years who is living in a residence hall should receive a dose if she did not receive a dose on or after her 16th birthday. Adults who have certain high-risk conditions should receive one or more doses of vaccine.  Hepatitis A vaccine. Adults who wish to be protected from this disease, have certain high-risk conditions, work with hepatitis A-infected animals, work in hepatitis A research labs, or travel to or work in countries with a high rate of hepatitis A should be immunized. Adults who were previously unvaccinated and who anticipate close contact with an international adoptee during the first 60 days after arrival in the [place] from a country with a high rate of hepatitis A should be immunized.  Hepatitis B vaccine. Adults who wish to be protected from this disease, have certain high-risk conditions, may be exposed to blood or other infectious body fluids, are household contacts or sex partners of hepatitis B positive people, are clients or workers in certain care facilities, or travel to or work in countries with a high rate of hepatitis B should be immunized.  Haemophilus influenzae type b (Hib) vaccine. A previously unvaccinated person with asplenia or sickle cell disease or having a scheduled splenectomy should receive 1 dose of Hib vaccine. Regardless of previous immunization, a recipient of a hematopoietic stem cell transplant should receive a 3-dose series 6-12 months after her successful transplant. Hib vaccine is not recommended for adults with HIV infection. Preventive Services / Frequency Ages 64 to 68 years  Blood pressure check.** / Every 1 to 2 years.  Lipid and cholesterol check.** / Every 5 years beginning at age  22.  Clinical breast exam.** / Every 3 years for women in their 88s and 53s.  BRCA-related cancer risk assessment.** / For women who have family members with a BRCA-related cancer (breast, ovarian, tubal, or peritoneal cancers).  Pap test.** / Every 2 years from ages 90 through 51. Every 3 years starting at age 21 through age 56 or 3 with a history of 3 consecutive normal Pap tests.  HPV screening.** / Every 3 years from ages 24 through ages 1 to 46 with a history of 3 consecutive normal Pap tests.  Hepatitis C blood test.** / For any individual with known risks for hepatitis C.  Skin self-exam. / Monthly.  Influenza vaccine. / Every year.  Tetanus, diphtheria, and acellular pertussis (Tdap, Td) vaccine.** / Consult your health care provider. Pregnant women should receive 1 dose of Tdap vaccine during each pregnancy. 1 dose of Td every 10 years.  Varicella vaccine.** / Consult your health care provider. Pregnant females who do not have evidence of immunity should receive the first dose after pregnancy.  HPV vaccine. / 3 doses over 6 months, if 72 and younger. The vaccine is not recommended for use in pregnant females. However, pregnancy testing is not needed before receiving a dose.  Measles, mumps, rubella (MMR) vaccine.** / You need at least 1 dose of MMR if you were born in 1957 or later. You may also need a 2nd dose. For females of childbearing age, rubella immunity should be determined. If there is no evidence of immunity, females who are not pregnant should be vaccinated. If there is no evidence of immunity, females who are pregnant should delay immunization until after pregnancy.  Pneumococcal 13-valent conjugate (PCV13) vaccine.** / Consult your health care provider.  Pneumococcal polysaccharide (PPSV23) vaccine.** / 1 to 2 doses if you smoke cigarettes or if you have certain conditions.  Meningococcal vaccine.** /  1 dose if you are age 19 to 21 years and a first-year college  student living in a residence hall, or have one of several medical conditions, you need to get vaccinated against meningococcal disease. You may also need additional booster doses.  Hepatitis A vaccine.** / Consult your health care provider.  Hepatitis B vaccine.** / Consult your health care provider.  Haemophilus influenzae type b (Hib) vaccine.** / Consult your health care provider. Ages 40 to 64 years  Blood pressure check.** / Every 1 to 2 years.  Lipid and cholesterol check.** / Every 5 years beginning at age 20 years.  Lung cancer screening. / Every year if you are aged 55-80 years and have a 30-pack-year history of smoking and currently smoke or have quit within the past 15 years. Yearly screening is stopped once you have quit smoking for at least 15 years or develop a health problem that would prevent you from having lung cancer treatment.  Clinical breast exam.** / Every year after age 40 years.  BRCA-related cancer risk assessment.** / For women who have family members with a BRCA-related cancer (breast, ovarian, tubal, or peritoneal cancers).  Mammogram.** / Every year beginning at age 40 years and continuing for as long as you are in good health. Consult with your health care provider.  Pap test.** / Every 3 years starting at age 30 years through age 65 or 70 years with a history of 3 consecutive normal Pap tests.  HPV screening.** / Every 3 years from ages 30 years through ages 65 to 70 years with a history of 3 consecutive normal Pap tests.  Fecal occult blood test (FOBT) of stool. / Every year beginning at age 50 years and continuing until age 75 years. You may not need to do this test if you get a colonoscopy every 10 years.  Flexible sigmoidoscopy or colonoscopy.** / Every 5 years for a flexible sigmoidoscopy or every 10 years for a colonoscopy beginning at age 50 years and continuing until age 75 years.  Hepatitis C blood test.** / For all people born from 1945 through  1965 and any individual with known risks for hepatitis C.  Skin self-exam. / Monthly.  Influenza vaccine. / Every year.  Tetanus, diphtheria, and acellular pertussis (Tdap/Td) vaccine.** / Consult your health care provider. Pregnant women should receive 1 dose of Tdap vaccine during each pregnancy. 1 dose of Td every 10 years.  Varicella vaccine.** / Consult your health care provider. Pregnant females who do not have evidence of immunity should receive the first dose after pregnancy.  Zoster vaccine.** / 1 dose for adults aged 60 years or older.  Measles, mumps, rubella (MMR) vaccine.** / You need at least 1 dose of MMR if you were born in 1957 or later. You may also need a 2nd dose. For females of childbearing age, rubella immunity should be determined. If there is no evidence of immunity, females who are not pregnant should be vaccinated. If there is no evidence of immunity, females who are pregnant should delay immunization until after pregnancy.  Pneumococcal 13-valent conjugate (PCV13) vaccine.** / Consult your health care provider.  Pneumococcal polysaccharide (PPSV23) vaccine.** / 1 to 2 doses if you smoke cigarettes or if you have certain conditions.  Meningococcal vaccine.** / Consult your health care provider.  Hepatitis A vaccine.** / Consult your health care provider.  Hepatitis B vaccine.** / Consult your health care provider.  Haemophilus influenzae type b (Hib) vaccine.** / Consult your health care provider. Ages 65   years and over  Blood pressure check.** / Every 1 to 2 years.  Lipid and cholesterol check.** / Every 5 years beginning at age 22 years.  Lung cancer screening. / Every year if you are aged 73-80 years and have a 30-pack-year history of smoking and currently smoke or have quit within the past 15 years. Yearly screening is stopped once you have quit smoking for at least 15 years or develop a health problem that would prevent you from having lung cancer  treatment.  Clinical breast exam.** / Every year after age 4 years.  BRCA-related cancer risk assessment.** / For women who have family members with a BRCA-related cancer (breast, ovarian, tubal, or peritoneal cancers).  Mammogram.** / Every year beginning at age 40 years and continuing for as long as you are in good health. Consult with your health care provider.  Pap test.** / Every 3 years starting at age 9 years through age 34 or 91 years with 3 consecutive normal Pap tests. Testing can be stopped between 65 and 70 years with 3 consecutive normal Pap tests and no abnormal Pap or HPV tests in the past 10 years.  HPV screening.** / Every 3 years from ages 57 years through ages 64 or 45 years with a history of 3 consecutive normal Pap tests. Testing can be stopped between 65 and 70 years with 3 consecutive normal Pap tests and no abnormal Pap or HPV tests in the past 10 years.  Fecal occult blood test (FOBT) of stool. / Every year beginning at age 15 years and continuing until age 17 years. You may not need to do this test if you get a colonoscopy every 10 years.  Flexible sigmoidoscopy or colonoscopy.** / Every 5 years for a flexible sigmoidoscopy or every 10 years for a colonoscopy beginning at age 86 years and continuing until age 71 years.  Hepatitis C blood test.** / For all people born from [birth year] through 1966 and any individual with known risks for hepatitis C.  Osteoporosis screening.** / A one-time screening for women ages 83 years and over and women at risk for fractures or osteoporosis.  Skin self-exam. / Monthly.  Influenza vaccine. / Every year.  Tetanus, diphtheria, and acellular pertussis (Tdap/Td) vaccine.** / 1 dose of Td every 10 years.  Varicella vaccine.** / Consult your health care provider.  Zoster vaccine.** / 1 dose for adults aged 61 years or older.  Pneumococcal 13-valent conjugate (PCV13) vaccine.** / Consult your health care provider.  Pneumococcal  polysaccharide (PPSV23) vaccine.** / 1 dose for all adults aged 28 years and older.  Meningococcal vaccine.** / Consult your health care provider.  Hepatitis A vaccine.** / Consult your health care provider.  Hepatitis B vaccine.** / Consult your health care provider.  Haemophilus influenzae type b (Hib) vaccine.** / Consult your health care provider. ** Family history and personal history of risk and conditions may change your health care provider's recommendations. Document Released: 01/03/2002 Document Revised: 03/24/2014 Document Reviewed: 04/04/2011 [facility] Patient Information 2015 [place], [place]. This information is not intended to replace advice given to you by your health care provider. Make sure you discuss any questions you have with your health care provider.

## 2014-08-14 NOTE — Progress Notes (Signed)
Pre visit review using our clinic review tool, if applicable. No additional management support is needed unless otherwise documented below in the visit note. 

## 2014-08-21 ENCOUNTER — Ambulatory Visit (INDEPENDENT_AMBULATORY_CARE_PROVIDER_SITE_OTHER): Payer: BC Managed Care – PPO | Admitting: Family Medicine

## 2014-08-21 ENCOUNTER — Encounter: Payer: Self-pay | Admitting: Family Medicine

## 2014-08-21 ENCOUNTER — Other Ambulatory Visit (INDEPENDENT_AMBULATORY_CARE_PROVIDER_SITE_OTHER): Payer: BC Managed Care – PPO

## 2014-08-21 ENCOUNTER — Ambulatory Visit (INDEPENDENT_AMBULATORY_CARE_PROVIDER_SITE_OTHER)
Admission: RE | Admit: 2014-08-21 | Discharge: 2014-08-21 | Disposition: A | Discharge status: Home / Self Care | Payer: BC Managed Care – PPO | Source: Ambulatory Visit | Attending: Family Medicine | Admitting: Family Medicine

## 2014-08-21 VITALS — BP 106/68 | HR 72 | Ht 67.0 in | Wt 188.0 lb

## 2014-08-21 DIAGNOSIS — M25532 Pain in left wrist: Secondary | ICD-10-CM | POA: Insufficient documentation

## 2014-08-21 NOTE — Progress Notes (Signed)
[facility] [place], [place] [address] Phone: [phone] Subjective:    I'm seeing this patient by the request  of:  Garnet Koyanagi, DO   CC: Left wrist pain  Alisha Chen is a 48 y.o. female coming in with complaint of left wrist pain. Patient does not however any true injury. Patient states started months ago.  Started with mild discomfort, notices a bump minimally painful to direct palpation. Patient denies any numbness or tingling. Patient states that this has been going on for quite some time. Now waking her up at night. Patient states that she would just like to know what it is more than anything else. Patient did have a ganglion cyst on the other side but there was on the dorsal aspect of her wrist and had removal in 1999. Patient denies any fevers or chills or any abnormal weight loss. Patient still able to use her wrist but direct contact for a long period of time can give her a dull aching sensation.     Past medical history, social, surgical and family history all reviewed in electronic medical record.   Review of Systems: No headache, visual changes, nausea, vomiting, diarrhea, constipation, dizziness, abdominal pain, skin rash, fevers, chills, night sweats, weight loss, swollen lymph nodes, body aches, joint swelling, muscle aches, chest pain, shortness of breath, mood changes.   Objective Blood pressure 106/68, pulse 72, height 5\' 7"  (1.702 m), weight 188 lb (85.276 kg), SpO2 99.00%.  General: No apparent distress alert and oriented x3 mood and affect normal, dressed appropriately.  HEENT: Pupils equal, extraocular movements intact  Respiratory: Patient's speak in full sentences and does not appear short of breath  Cardiovascular: No lower extremity edema, non tender, no erythema  Skin: Warm dry intact with no signs of infection or rash on extremities or on axial skeleton.  Abdomen: Soft nontender  Neuro: Cranial  nerves II through XII are intact, neurovascularly intact in all extremities with 2+ DTRs and 2+ pulses.  Lymph: No lymphadenopathy of posterior or anterior cervical chain or axillae bilaterally.  Gait normal with good balance and coordination.  MSK:  Non tender with full range of motion and good stability and symmetric strength and tone of shoulders, elbows,  hip, knee and ankles bilaterally.  Wrist: The left Inspection shows on patient's volar aspect of her wrist just proximal to the scaphoid and over the distal radius there is a soft masslike protrusions and seems to be fairly superficial. Hard to do standing from ROM smooth and normal with good flexion and extension and ulnar/this is not fixed. Soft to palpation. Seems to be compressible. Neurovascularly intact distally with good capillary refill. Good grip strength. deviation that is symmetrical with opposite wrist. Palpation is normal over metacarpals, navicular, lunate, and TFCC; tendons without tenderness/ swelling No snuffbox tenderness. No tenderness over Canal of Guyon. Strength 5/5 in all directions without pain. Negative Finkelstein, tinel's and phalens. Negative Watson's test. Contralateral wrist unremarkable  MSK US performed [id number] This study was ordered, performed, and interpreted by Charlann Boxer D.O.  Wrist: All extensor compartments visualized and tendons all normal in appearance without fraying, tears, or sheath effusions. No effusion seen. TFCC intact. Scapholunate ligament intact. Carpal tunnel visualized and median nerve area normal, flexor tendons all normal in appearance without fraying, tears, or sheath effusions. Patient though does have what appears to be a ganglion cyst. Patient though does have significant collaterals of her radial artery in the area  making it almost impossible to find a direct way for aspiration. Difficult to assess if this is a ganglion cyst secondary to the surrounding hypoechoic changes. Power  doppler signal normal.  IMPRESSION:  Ganglion cyst versus other mass or aneurysm difficult to assess on ultrasound.     Impression and Recommendations:     This case required medical decision making of moderate complexity.

## 2014-08-21 NOTE — Assessment & Plan Note (Signed)
Patient's left wrist she is difficult to diagnose at this time. Most likely the ganglion cyst the patient does have what appears to be significant collaterals from the radial artery that could be encompassing this area. It does not appear to be a mass that does not appear to have its own vascular supply. Patient does have a past medical history of cancer. Patient will get x-rays to rule out any other bony abnormality. Differential also includes lipoma, synovitis but this does not appear likely with ultrasound. This could also be potential aneurysm. Patient is having very minimal pain at this time. Patient was given a brace to try to wear to try and decrease any inflammation of be secondary to overuse. We'll see patient topical anti-inflammatory try. Patient given home exercise program to try and work on strengthening. Patient and will come back in 3 weeks. If continuing to have difficulty or worsening I would consider further imaging with an MRI. And then depending on diagnosis some intervention could be possible.

## 2014-08-21 NOTE — Patient Instructions (Addendum)
Good to see you.  Ice 20 minutes 2 times daily. Usually after activity and before bed. Exercises 3 times a week.  Try pennsaid twice daily Wear brace day and night for 2 weeks.  Try range of motion exercises most days of the week Xrays downstairs today Come back again in 3 weeks to make sure you are doing better or will look into it further or stick a needle into it.

## 2014-09-10 ENCOUNTER — Ambulatory Visit (INDEPENDENT_AMBULATORY_CARE_PROVIDER_SITE_OTHER): Payer: BC Managed Care – PPO | Admitting: Family Medicine

## 2014-09-10 ENCOUNTER — Encounter: Payer: Self-pay | Admitting: Family Medicine

## 2014-09-10 ENCOUNTER — Other Ambulatory Visit (INDEPENDENT_AMBULATORY_CARE_PROVIDER_SITE_OTHER): Payer: BC Managed Care – PPO

## 2014-09-10 VITALS — BP 110/80 | HR 66 | Ht 67.0 in | Wt 185.0 lb

## 2014-09-10 DIAGNOSIS — M25532 Pain in left wrist: Secondary | ICD-10-CM

## 2014-09-10 DIAGNOSIS — M778 Other enthesopathies, not elsewhere classified: Secondary | ICD-10-CM

## 2014-09-10 NOTE — Assessment & Plan Note (Signed)
We did attempt a flexor tendon sheath injection today. Patient had complete resolution of pain immediately. Hopefully that this will be beneficial. If this continues to give her difficulty. If She has any worsening pain or any numbness this will come back to me today her seek medical attention. Would consider doing a MRI of the area to further evaluate to make sure there is nothing else of concern. Patient followup in 2 weeks for further evaluation.  Spent greater than 25 minutes with patient face-to-face and had greater than 50% of counseling including as described above in assessment and plan.

## 2014-09-10 NOTE — Patient Instructions (Signed)
Good to see you Ice in 6 hours.  See me again in 2 weeks to see how you are doing.

## 2014-09-10 NOTE — Progress Notes (Signed)
[facility] [place], [place] [address] Phone: [phone] Subjective:     CC: Left wrist pain followup  [id number] Alisha Chen is a 48 y.o. female coming in with complaint of left wrist pain. Patient does not however any true injury. Patient was seen previously and there was a concern for a ganglion cyst of the left wrist but secondary to the proximity of the radial artery and and collateral circulation we attempted conservative therapy. Patient is to wear a brace, topical anti-inflammatory, as well as home exercises. Patient states that she made only 10% improvement. Patient states that she has good days and some bad days. Patient states that unfortunately the bad days her starting to increase. Not stopping her from any activities at this time.     Past medical history, social, surgical and family history all reviewed in electronic medical record.   Review of Systems: No headache, visual changes, nausea, vomiting, diarrhea, constipation, dizziness, abdominal pain, skin rash, fevers, chills, night sweats, weight loss, swollen lymph nodes, body aches, joint swelling, muscle aches, chest pain, shortness of breath, mood changes.   Objective Blood pressure 110/80, pulse 66, height 5\' 7"  (1.702 m), weight 185 lb (83.915 kg), SpO2 99.00%.  General: No apparent distress alert and oriented x3 mood and affect normal, dressed appropriately.  HEENT: Pupils equal, extraocular movements intact  Respiratory: Patient's speak in full sentences and does not appear short of breath  Cardiovascular: No lower extremity edema, non tender, no erythema  Skin: Warm dry intact with no signs of infection or rash on extremities or on axial skeleton.  Abdomen: Soft nontender  Neuro: Cranial nerves II through XII are intact, neurovascularly intact in all extremities with 2+ DTRs and 2+ pulses.  Lymph: No lymphadenopathy of posterior or anterior cervical chain or  axillae bilaterally.  Gait normal with good balance and coordination.  MSK:  Non tender with full range of motion and good stability and symmetric strength and tone of shoulders, elbows,  hip, knee and ankles bilaterally.  Wrist: The left Inspection shows on patient's volar aspect of her wrist just proximal to the scaphoid and over the distal radius there is a soft masslike protrusions and seems to be fairly superficial.  ROM smooth and normal with good flexion and extension and ulnar/this is not fixed. Mild tenderness to palpation. Seems to be compressible more ropelike than previous.. Neurovascularly intact distally with good capillary refill. Good grip strength. deviation that is symmetrical with opposite wrist. Palpation is normal over metacarpals, navicular, lunate, and TFCC; tendons without tenderness/ swelling No snuffbox tenderness. No tenderness over Canal of Guyon. Strength 5/5 in all directions without pain. Negative Finkelstein, tinel's and phalens. Negative Watson's test. Contralateral wrist unremarkable  MSK US performed [id number] This study was ordered, performed, and interpreted by Charlann Boxer D.O.  Wrist: All extensor compartments visualized and tendons all normal in appearance without fraying, tears, or sheath effusions. No effusion seen. TFCC intact. Scapholunate ligament intact. Carpal tunnel visualized and median nerve area normal,  Patient no longer has the cystic like formation but patient does have appears to be more of a flexor tendon sheath effusion. Patient though does have significant collaterals of her radial artery in the area Power doppler signal normal.  IMPRESSION:  Flexor tendon inflammation  After verbal consent patient was prepped with alcohol swabs and with a 25-gauge from a proximal to distal approach along the flexor tendon sheath patient was injected with 1 cc of 0.5%  Marcaine and 0.5 cc of Kenalog 40 mg/dL within the tendon sheath. We were significantly  proximal to the area of where the collaterals overlap this area. Patient tolerated the procedure well. Good capillary refill of the fingers 5 minutes after the injection. Patient had full function of the wrist and hand. Postinjection instructions given.    Impression and Recommendations:     This case required medical decision making of moderate complexity.

## 2014-09-22 ENCOUNTER — Encounter: Payer: Self-pay | Admitting: Family Medicine

## 2014-09-23 ENCOUNTER — Encounter: Payer: Self-pay | Admitting: Family Medicine

## 2014-09-24 ENCOUNTER — Encounter: Payer: Self-pay | Admitting: Family Medicine

## 2014-09-24 ENCOUNTER — Ambulatory Visit (INDEPENDENT_AMBULATORY_CARE_PROVIDER_SITE_OTHER): Payer: BC Managed Care – PPO | Admitting: Family Medicine

## 2014-09-24 VITALS — BP 120/78 | HR 59 | Ht 67.0 in | Wt 183.0 lb

## 2014-09-24 DIAGNOSIS — M778 Other enthesopathies, not elsewhere classified: Secondary | ICD-10-CM

## 2014-09-24 NOTE — Assessment & Plan Note (Signed)
Patient responded significantly well to the injection previously. Patient is now pain-free which is wonderful. I believe the patient does have an underlying possibly flexor tendon tear and there is some scar tissue formation that is still occurring. 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 We discussed for further confirmation MRI would be needed which can be inexpensive test. Discussing with patient at great length we do not feel that this is necessary at the time and patient is going to continue to monitor. Patient has any worsening pain or anything of that nature she will come back for further evaluation. Patient of course is concerned due to her past medical history and I discussed with her that at any point if she is having worsening pain we will order an x-ray and MRI. Patient though otherwise can follow-up with me in intervals we can continue to measure and make sure that there is no significant change. Patient is having no systemic finding slime not concern for any type of abnormal mass.  Spent greater than 25 minutes with patient face-to-face and had greater than 50% of counseling including as described above in assessment and plan.

## 2014-09-24 NOTE — Progress Notes (Signed)
  [facility] [place], [place] [address] Phone: [phone] Subjective:     CC: Left wrist pain followup  [id number] Alisha Chen is a 48 y.o. female coming in with complaint of left wrist pain. Patient does not however any true injury. Patient was seen and was difficult to assess patient and more of a ganglion cyst versus a flexor tendinitis. We decided that it was more secondary to flexor tendinitis and was given an injection within the tendon sheath. Patient states that this did help about the pain significantly. Patient has not had any pain since the injection. Still has the masslike structure though that she was concerned about in the first place. Does state that it seems to be smaller. Denies any numbness or tingling. States that she is sleeping comfortably at night. Patient would like to know though what is giving her this phenotypic change.    Past medical history, social, surgical and family history all reviewed in electronic medical record.   Review of Systems: No headache, visual changes, nausea, vomiting, diarrhea, constipation, dizziness, abdominal pain, skin rash, fevers, chills, night sweats, weight loss, swollen lymph nodes, body aches, joint swelling, muscle aches, chest pain, shortness of breath, mood changes.   Objective Blood pressure 120/78, pulse 59, height 5\' 7"  (1.702 m), weight 183 lb (83.008 kg), SpO2 99 %.  General: No apparent distress alert and oriented x3 mood and affect normal, dressed appropriately.  HEENT: Pupils equal, extraocular movements intact  Respiratory: Patient's speak in full sentences and does not appear short of breath  Cardiovascular: No lower extremity edema, non tender, no erythema  Skin: Warm dry intact with no signs of infection or rash on extremities or on axial skeleton.  Abdomen: Soft nontender  Neuro: Cranial nerves II through XII are intact, neurovascularly intact in all extremities with  2+ DTRs and 2+ pulses.  Lymph: No lymphadenopathy of posterior or anterior cervical chain or axillae bilaterally.  Gait normal with good balance and coordination.  MSK:  Non tender with full range of motion and good stability and symmetric strength and tone of shoulders, elbows,  hip, knee and ankles bilaterally.  Wrist: The left Inspection shows on patient's volar aspect of her wrist just proximal to the scaphoid and over the distal radius there is a soft masslike protrusions and seems to be fairly superficial distal was less than 75% of the size it was previous to the injection.  ROM smooth and normal with good flexion and extension and ulnar/this is not fixed. Mild tenderness to palpation. Seems to be compressible more ropelike than previous.. Neurovascularly intact distally with good capillary refill. Good grip strength. deviation that is symmetrical with opposite wrist. Palpation is normal over metacarpals, navicular, lunate, and TFCC; tendons without tenderness/ swelling No snuffbox tenderness. No tenderness over Canal of Guyon. Strength 5/5 in all directions without pain. Negative Finkelstein, tinel's and phalens. Negative Watson's test. Contralateral wrist unremarkable   Impression and Recommendations:     This case required medical decision making of moderate complexity.

## 2014-09-24 NOTE — Patient Instructions (Signed)
Good to go Do whatever you want now.  If pain come back come see me again and we will see what's going on.  See me in 3 weeks if needed.  Ice still can help You know where I am if you need me.

## 2014-10-28 ENCOUNTER — Other Ambulatory Visit: Payer: Self-pay | Admitting: Adult Health

## 2014-10-28 DIAGNOSIS — Z9889 Other specified postprocedural states: Secondary | ICD-10-CM

## 2014-10-28 DIAGNOSIS — Z853 Personal history of malignant neoplasm of breast: Secondary | ICD-10-CM

## 2014-11-04 ENCOUNTER — Other Ambulatory Visit: Payer: Self-pay | Admitting: Hematology and Oncology

## 2014-11-04 DIAGNOSIS — Z853 Personal history of malignant neoplasm of breast: Secondary | ICD-10-CM

## 2014-11-04 DIAGNOSIS — Z9889 Other specified postprocedural states: Secondary | ICD-10-CM

## 2014-11-07 ENCOUNTER — Ambulatory Visit
Admission: RE | Admit: 2014-11-07 | Discharge: 2014-11-07 | Disposition: A | Discharge status: Home / Self Care | Payer: BC Managed Care – PPO | Source: Ambulatory Visit | Attending: Adult Health | Admitting: Adult Health

## 2014-11-07 DIAGNOSIS — Z853 Personal history of malignant neoplasm of breast: Secondary | ICD-10-CM

## 2014-11-07 DIAGNOSIS — Z9889 Other specified postprocedural states: Secondary | ICD-10-CM

## 2014-11-18 ENCOUNTER — Telehealth: Payer: Self-pay | Admitting: Hematology and Oncology

## 2014-11-18 NOTE — Telephone Encounter (Signed)
call day - moved 1/25 appt to 2/4. s/w pt she is aware.

## 2014-12-15 ENCOUNTER — Ambulatory Visit: Payer: BC Managed Care – PPO | Admitting: Oncology

## 2014-12-15 ENCOUNTER — Ambulatory Visit: Payer: BC Managed Care – PPO | Admitting: Hematology and Oncology

## 2014-12-15 ENCOUNTER — Other Ambulatory Visit: Payer: BC Managed Care – PPO

## 2014-12-24 ENCOUNTER — Other Ambulatory Visit: Payer: Self-pay | Admitting: *Deleted

## 2014-12-24 DIAGNOSIS — C50419 Malignant neoplasm of upper-outer quadrant of unspecified female breast: Secondary | ICD-10-CM

## 2014-12-25 ENCOUNTER — Telehealth: Payer: Self-pay | Admitting: Hematology and Oncology

## 2014-12-25 ENCOUNTER — Other Ambulatory Visit (HOSPITAL_BASED_OUTPATIENT_CLINIC_OR_DEPARTMENT_OTHER): Payer: BC Managed Care – PPO

## 2014-12-25 ENCOUNTER — Ambulatory Visit (HOSPITAL_BASED_OUTPATIENT_CLINIC_OR_DEPARTMENT_OTHER): Payer: BC Managed Care – PPO | Admitting: Hematology and Oncology

## 2014-12-25 VITALS — BP 120/64 | HR 65 | Temp 98.1°F | Resp 18 | Ht 67.0 in | Wt 188.1 lb

## 2014-12-25 DIAGNOSIS — C50412 Malignant neoplasm of upper-outer quadrant of left female breast: Secondary | ICD-10-CM

## 2014-12-25 DIAGNOSIS — C50419 Malignant neoplasm of upper-outer quadrant of unspecified female breast: Secondary | ICD-10-CM

## 2014-12-25 DIAGNOSIS — C773 Secondary and unspecified malignant neoplasm of axilla and upper limb lymph nodes: Secondary | ICD-10-CM

## 2014-12-25 LAB — CBC WITH DIFFERENTIAL/PLATELET
BASO%: 0.6 % (ref 0.0–2.0)
Basophils Absolute: 0 10*3/uL (ref 0.0–0.1)
EOS%: 2.6 % (ref 0.0–7.0)
Eosinophils Absolute: 0.1 10*3/uL (ref 0.0–0.5)
HEMATOCRIT: 35.9 % (ref 34.8–46.6)
HEMOGLOBIN: 11.7 g/dL (ref 11.6–15.9)
LYMPH%: 32.1 % (ref 14.0–49.7)
MCH: 29 pg (ref 25.1–34.0)
MCHC: 32.6 g/dL (ref 31.5–36.0)
MCV: 89.1 fL (ref 79.5–101.0)
MONO#: 0.3 10*3/uL (ref 0.1–0.9)
MONO%: 5.5 % (ref 0.0–14.0)
NEUT%: 59.2 % (ref 38.4–76.8)
NEUTROS ABS: 3 10*3/uL (ref 1.5–6.5)
PLATELETS: 175 10*3/uL (ref 145–400)
RBC: 4.03 10*6/uL (ref 3.70–5.45)
RDW: 12.6 % (ref 11.2–14.5)
WBC: 5.1 10*3/uL (ref 3.9–10.3)
lymph#: 1.6 10*3/uL (ref 0.9–3.3)

## 2014-12-25 LAB — COMPREHENSIVE METABOLIC PANEL (CC13)
ALT: 19 U/L (ref 0–55)
AST: 19 U/L (ref 5–34)
Albumin: 3.7 g/dL (ref 3.5–5.0)
Alkaline Phosphatase: 116 U/L (ref 40–150)
Anion Gap: 8 mEq/L (ref 3–11)
BUN: 22.5 mg/dL (ref 7.0–26.0)
CO2: 27 mEq/L (ref 22–29)
Calcium: 10.2 mg/dL (ref 8.4–10.4)
Chloride: 105 mEq/L (ref 98–109)
Creatinine: 0.9 mg/dL (ref 0.6–1.1)
EGFR: >90 mL/min/{1.73_m2} (ref >90)
GLUCOSE: 71 mg/dL (ref 70–140)
Potassium: 4.4 mEq/L (ref 3.5–5.1)
Sodium: 140 mEq/L (ref 136–145)
TOTAL PROTEIN: 6.9 g/dL (ref 6.4–8.3)
Total Bilirubin: 0.28 mg/dL (ref 0.20–1.20)

## 2014-12-25 MED ORDER — ANASTROZOLE 1 MG PO TABS: 1.0000 mg | ORAL_TABLET | Freq: Every day | ORAL | Status: DC

## 2014-12-25 NOTE — Assessment & Plan Note (Signed)
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 The primary tumor revealed 2 foci measuring 1.3 and 0.8 cm.with 1/22 LN positive for metastatic disease. She has now completed 4 cycles of FEC 100.followed by Taxol. Could not tolerate tamoxifen for more than 3 weeks.  Recommendation: I recommended that she start anastrozole 1 mg daily because she is now postmenopausal.  Aromatase inhibitor counseling:We discussed the risks and benefits of anti-estrogen therapy with aromatase inhibitors. These include but not limited to insomnia, hot flashes, mood changes, vaginal dryness, bone density loss, and weight gain. Although rare, serious side effects including endometrial cancer, risk of blood clots were also discussed. We strongly believe that the benefits far outweigh the risks. Patient understands these risks and consented to starting treatment. Planned treatment duration is 5 years.  After much thought and discussion, patient agreed to try Arimidex. I will see her back in 3 months for follow-up to discuss tolerability to treatment. I recommended Arimidex because of first strong estrogen progesterone receptor positivity and the fact that she had 1 out of 22 lymph nodes positive for cancer.

## 2014-12-25 NOTE — Progress Notes (Signed)
Patient Care Team: [name], DO as PCP - General (Family Medicine)   DIAGNOSIS: 49 year old female who was originally seen at the multidisciplinary breast clinic on 01/18/2012 for a stage II a (T1 C. 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 Patient is now status post left breast lumpectomy with left axillary lymph node dissection performed on 02/10/2012  PRIOR THERAPY:  #1 patient underwent a left breast lumpectomy that revealed 2 foci of invasive ductal carcinoma measuring 1.3 cm and 0.8 cm grade 2 with adjacent high-grade ductal carcinoma in situ with no evidence of angiolymphatic invasion. The tumor was estrogen receptor +97% progesterone receptor +100% HER-2/neu negative proliferation marker 48% second focus was ER +96% PR +99% HER-2/neu negative with a proliferation marker 74%. 1/22 lymph nodes were examined all of which were positive for metastatic disease  .#2 Patient is now status post 4 cycles of FEC 100 given between 03/23/2012 2 05/10/2012. She completed all of her therapy and tolerated it reasonably well.  #3 she was started on single agent weekly Taxol at 80 mg per meter squared x12 weeks starting 05/23/2012, however discontinued it due to side effects  #4 Radiation therapy 07/10/12 - 08/23/12  5. patient was begun on tamoxifen starting on 09/14/2012. Discontinued after 3 weeks 6. Since patient became postmenopausal, starting anastrozole 1 mg daily 12/25/2014 plan is for 5 years  CHIEF COMPLIANT: follow-up of breast cancer  INTERVAL HISTORY: Alisha Chen is a 49 year old lady with above-mentioned history of left-sided breast cancer treated with lumpectomy followed with systemic chemotherapy and radiation. She tried to take tamoxifen but could not tolerate it because it made her mind very cloudy. She has not been on any antiestrogen therapy for the past couple of years. She comes in today for routine follow-up. She reports that her  gynecologist had informed her that she is not menopausal. She complains of weight gain issues as well as not being able to exercise too much.  REVIEW OF SYSTEMS:   Constitutional: Denies fevers, chills or abnormal weight loss Eyes: Denies blurriness of vision Ears, nose, mouth, throat, and face: Denies mucositis or sore throat Respiratory: Denies cough, dyspnea or wheezes Cardiovascular: Denies palpitation, chest discomfort or lower extremity swelling Gastrointestinal:  Denies nausea, heartburn or change in bowel habits Skin: Denies abnormal skin rashes Lymphatics: Denies new lymphadenopathy or easy bruising Neurological:Denies numbness, tingling or new weaknesses Behavioral/Psych: Mood is stable, no new changes  Breast:  denies any pain or lumps or nodules in either breasts All other systems were reviewed with the patient and are negative.  I have reviewed the past medical history, past surgical history, social history and family history with the patient and they are unchanged from previous note.  ALLERGIES:  is allergic to shellfish-derived products; clarithromycin; ondansetron; and penicillins.  MEDICATIONS:  Current Outpatient Prescriptions  Medication Sig Dispense Refill  . cholecalciferol (VITAMIN D) 1000 UNITS tablet Take 1,000 Units by mouth daily.    . diphenhydrAMINE (BENADRYL) 25 MG tablet Take 25 mg by mouth every 6 (six) hours as needed for itching.    . vitamin B-12 (CYANOCOBALAMIN) 1000 MCG tablet Take 1,000 mcg by mouth daily.    Marland Kitchen anastrozole (ARIMIDEX) 1 MG tablet Take 1 tablet (1 mg total) by mouth daily. 90 tablet 3   No current facility-administered medications for this visit.    PHYSICAL EXAMINATION: ECOG PERFORMANCE STATUS: 0 - Asymptomatic  Filed Vitals:   12/25/14 1400  BP: 120/64  Pulse: 65  Temp: 98.1 F (36.7 C)  Resp: 18   Filed Weights   12/25/14 1400  Weight: 188 lb 1.6 oz (85.322 kg)    GENERAL:alert, no distress and comfortable SKIN:  skin color, texture, turgor are normal, no rashes or significant lesions EYES: normal, Conjunctiva are pink and non-injected, sclera clear OROPHARYNX:no exudate, no erythema and lips, buccal mucosa, and tongue normal  NECK: supple, thyroid normal size, non-tender, without nodularity LYMPH:  no palpable lymphadenopathy in the cervical, axillary or inguinal LUNGS: clear to auscultation and percussion with normal breathing effort HEART: regular rate & rhythm and no murmurs and no lower extremity edema ABDOMEN:abdomen soft, non-tender and normal bowel sounds Musculoskeletal:no cyanosis of digits and no clubbing  NEURO: alert & oriented x 3 with fluent speech, no focal motor/sensory deficits BREAST: No palpable masses or nodules in either right or left breasts. No palpable axillary supraclavicular or infraclavicular adenopathy no breast tenderness or nipple discharge. (exam performed in the presence of a chaperone)  LABORATORY DATA:  I have reviewed the data as listed   Chemistry      Component Value Date/Time   NA 140 12/25/2014 1349   NA 139 08/14/2014 0933   K 4.4 12/25/2014 1349   K 4.6 08/14/2014 0933   CL 104 08/14/2014 0933   CL 105 12/21/2012 1527   CO2 27 12/25/2014 1349   CO2 29 08/14/2014 0933   BUN 22.5 12/25/2014 1349   BUN 15 08/14/2014 0933   CREATININE 0.9 12/25/2014 1349   CREATININE 0.9 08/14/2014 0933      Component Value Date/Time   CALCIUM 10.2 12/25/2014 1349   CALCIUM 10.6* 08/14/2014 0933   ALKPHOS 116 12/25/2014 1349   ALKPHOS 110 08/14/2014 0933   AST 19 12/25/2014 1349   AST 19 08/14/2014 0933   ALT 19 12/25/2014 1349   ALT 18 08/14/2014 0933   BILITOT 0.28 12/25/2014 1349   BILITOT 0.4 08/14/2014 0933       Lab Results  Component Value Date   WBC 5.1 12/25/2014   HGB 11.7 12/25/2014   HCT 35.9 12/25/2014   MCV 89.1 12/25/2014   PLT 175 12/25/2014   NEUTROABS 3.0 12/25/2014   ASSESSMENT & PLAN:  Primary cancer of upper outer quadrant of  left female breast stage II a (T1cN1) invasive ductal carcinoma status post left lumpectomy with excellent lymph node dissection. The primary tumor revealed 2 foci measuring 1.3 and 0.8 cm.with 1/22 LN positive for metastatic disease. She has now completed 4 cycles of FEC 100.followed by Taxol. Could not tolerate tamoxifen for more than 3 weeks.  Recommendation: I recommended that she start anastrozole 1 mg daily because she is now postmenopausal.  Aromatase inhibitor counseling:We discussed the risks and benefits of anti-estrogen therapy with aromatase inhibitors. These include but not limited to insomnia, hot flashes, mood changes, vaginal dryness, bone density loss, and weight gain. Although rare, serious side effects including endometrial cancer, risk of blood clots were also discussed. We strongly believe that the benefits far outweigh the risks. Patient understands these risks and consented to starting treatment. Planned treatment duration is 5 years.  After much thought and discussion, patient agreed to try Arimidex. I will see her back in 3 months for follow-up to discuss tolerability to treatment. I recommended Arimidex because of first strong estrogen progesterone receptor positivity and the fact that she had 1 out of 22 lymph nodes positive for cancer.     No orders of the defined types were placed in this encounter.  The patient has a good understanding of the overall plan. she agrees with it. She will call with any problems that may develop before her next visit here.   Willard Farquharson K, MD    

## 2014-12-25 NOTE — Telephone Encounter (Signed)
per pof to sch pt appt-gave pt copy of sch °

## 2015-03-24 ENCOUNTER — Telehealth: Payer: Self-pay | Admitting: Hematology and Oncology

## 2015-03-24 NOTE — Telephone Encounter (Signed)
Patient called in to reschedule her appoint as she has not started her meds that dr Lindi Adie gave her

## 2015-03-26 ENCOUNTER — Ambulatory Visit: Payer: BC Managed Care – PPO | Admitting: Hematology and Oncology

## 2015-03-27 ENCOUNTER — Ambulatory Visit: Payer: BC Managed Care – PPO | Admitting: Family Medicine

## 2015-03-30 ENCOUNTER — Ambulatory Visit (INDEPENDENT_AMBULATORY_CARE_PROVIDER_SITE_OTHER): Payer: BC Managed Care – PPO | Admitting: Family Medicine

## 2015-03-30 ENCOUNTER — Encounter: Payer: Self-pay | Admitting: Family Medicine

## 2015-03-30 VITALS — BP 110/73 | HR 76 | Temp 98.2°F | Wt 186.2 lb

## 2015-03-30 DIAGNOSIS — J0110 Acute frontal sinusitis, unspecified: Secondary | ICD-10-CM | POA: Diagnosis not present

## 2015-03-30 MED ORDER — DOXYCYCLINE HYCLATE 100 MG PO TABS: 100.0000 mg | ORAL_TABLET | Freq: Two times a day (BID) | ORAL | Status: DC

## 2015-03-30 MED ORDER — FLUTICASONE PROPIONATE 50 MCG/ACT NA SUSP: 2.0000 | Freq: Every day | NASAL | Status: DC

## 2015-03-30 NOTE — Progress Notes (Signed)
Pre visit review using our clinic review tool, if applicable. No additional management support is needed unless otherwise documented below in the visit note. 

## 2015-03-30 NOTE — Progress Notes (Signed)
Subjective:    Patient ID: Alisha Chen, female    DOB: Feb 17, 1966, 49 y.o.   MRN: [id number]  HPI  Patient here for con't sinusitis.  Symptoms started May 1.  Pt taking decongestant with some relief.   Past Medical History  Diagnosis Date  . Back pain   . Hx: UTI (urinary tract infection)   . Status post chemotherapy 1 dose 05/23/12    Stopped after one cycle of Taxol due to Grade 2 Toxicity  . Status post chemotherapy 1 dose 05/23/12    Stopped after one cycle of Taxol due to Grade 2 Toxicity  . Breast cancer     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,dcis  . History of chemotherapy 03/23/12- 05/10/12    s/p 4 cycles of FEC   . S/P radiation therapy 07/10/12-08/23/12    Left Breast: 50 Gy/25 Fractions; Boost: 10 Gy/5 Fractions    Review of Systems  Constitutional: Positive for chills. Negative for fever.  HENT: Positive for congestion, postnasal drip, rhinorrhea and sinus pressure.   Respiratory: Negative for cough, chest tightness, shortness of breath and wheezing.   Cardiovascular: Negative for chest pain, palpitations and leg swelling.  Allergic/Immunologic: Negative for environmental allergies.    Current Outpatient Prescriptions on File Prior to Visit  Medication Sig Dispense Refill  . cholecalciferol (VITAMIN D) 1000 UNITS tablet Take 1,000 Units by mouth daily.    . diphenhydrAMINE (BENADRYL) 25 MG tablet Take 25 mg by mouth every 6 (six) hours as needed for itching.    . vitamin B-12 (CYANOCOBALAMIN) 1000 MCG tablet Take 1,000 mcg by mouth daily.     No current facility-administered medications on file prior to visit.       Objective:    Physical Exam  Constitutional: She is oriented to person, place, and time. She appears well-nourished.  HENT:  Nose: Mucosal edema, rhinorrhea and sinus tenderness present. No nasal deformity. Right sinus exhibits maxillary sinus tenderness and frontal sinus tenderness. Left sinus exhibits maxillary sinus tenderness  and frontal sinus tenderness.  Mouth/Throat: Mucous membranes are normal. Posterior oropharyngeal erythema present. No oropharyngeal exudate.  Neck: Normal range of motion. Neck supple.  Cardiovascular: Normal rate, regular rhythm and normal heart sounds.   No murmur heard. Pulmonary/Chest: Effort normal and breath sounds normal. No respiratory distress. She has no rales.  Lymphadenopathy:    She has cervical adenopathy.  Neurological: She is alert and oriented to person, place, and time.  Skin: Skin is warm. She is not diaphoretic.  Psychiatric: She has a normal mood and affect.    BP 110/73 mmHg  Pulse 76  Temp(Src) 98.2 F (36.8 C) (Oral)  Wt 186 lb 3.2 oz (84.46 kg)  SpO2 97% Wt Readings from Last 3 Encounters:  03/30/15 186 lb 3.2 oz (84.46 kg)  12/25/14 188 lb 1.6 oz (85.322 kg)  09/24/14 183 lb (83.008 kg)     Lab Results  Component Value Date   WBC 5.1 12/25/2014   HGB 11.7 12/25/2014   HCT 35.9 12/25/2014   PLT 175 12/25/2014   GLUCOSE 71 12/25/2014   CHOL 176 08/14/2014   TRIG 48.0 08/14/2014   HDL 55.60 08/14/2014   LDLCALC 111* 08/14/2014   ALT 19 12/25/2014   AST 19 12/25/2014   NA 140 12/25/2014   K 4.4 12/25/2014   CL 104 08/14/2014   CREATININE 0.9 12/25/2014   BUN 22.5 12/25/2014   CO2 27 12/25/2014   TSH 3.50 08/14/2014   INR 1.04  02/10/2011       Assessment & Plan:   Problem List Items Addressed This Visit    Acute sinusitis - Primary    Doxycycline flonase       Relevant Medications   doxycycline (VIBRA-TABS) 100 MG tablet   fluticasone (FLONASE) 50 MCG/ACT nasal spray      I have discontinued Ms. Wisecup's anastrozole. I am also having her start on doxycycline and fluticasone. Additionally, I am having her maintain her cholecalciferol, vitamin B-12, and diphenhydrAMINE.  Meds ordered this encounter  Medications  . doxycycline (VIBRA-TABS) 100 MG tablet    Sig: Take 1 tablet (100 mg total) by mouth 2 (two) times daily.     Dispense:  20 tablet    Refill:  0  . fluticasone (FLONASE) 50 MCG/ACT nasal spray    Sig: Place 2 sprays into both nostrils daily.    Dispense:  16 g    Refill:  [place], DO

## 2015-03-30 NOTE — Assessment & Plan Note (Signed)
Doxycycline flonase

## 2015-03-30 NOTE — Patient Instructions (Signed)

## 2015-04-24 ENCOUNTER — Encounter: Payer: Self-pay | Admitting: Medical

## 2015-04-24 ENCOUNTER — Ambulatory Visit (INDEPENDENT_AMBULATORY_CARE_PROVIDER_SITE_OTHER): Payer: BC Managed Care – PPO | Admitting: Medical

## 2015-04-24 ENCOUNTER — Ambulatory Visit: Payer: BC Managed Care – PPO | Admitting: Medical

## 2015-04-24 VITALS — BP 122/77 | HR 80 | Temp 98.0°F | Ht 67.0 in | Wt 187.8 lb

## 2015-04-24 DIAGNOSIS — G43809 Other migraine, not intractable, without status migrainosus: Secondary | ICD-10-CM

## 2015-04-24 DIAGNOSIS — N390 Urinary tract infection, site not specified: Secondary | ICD-10-CM | POA: Diagnosis not present

## 2015-04-24 DIAGNOSIS — R309 Painful micturition, unspecified: Secondary | ICD-10-CM | POA: Diagnosis not present

## 2015-04-24 DIAGNOSIS — G43909 Migraine, unspecified, not intractable, without status migrainosus: Secondary | ICD-10-CM | POA: Insufficient documentation

## 2015-04-24 MED ORDER — PHENAZOPYRIDINE HCL 200 MG PO TABS: 200.0000 mg | ORAL_TABLET | Freq: Three times a day (TID) | ORAL | Status: DC | PRN

## 2015-04-24 MED ORDER — KETOROLAC TROMETHAMINE 60 MG/2ML IM SOLN
60.0000 mg | Freq: Once | INTRAMUSCULAR | Status: AC
  Administered 2015-04-24: 60 mg via INTRAMUSCULAR

## 2015-04-24 MED ORDER — NITROFURANTOIN MONOHYD MACRO 100 MG PO CAPS: 100.0000 mg | ORAL_CAPSULE | Freq: Two times a day (BID) | ORAL | Status: DC

## 2015-04-24 NOTE — Patient Instructions (Signed)
UTI (urinary tract infection) Your appear to have a urinary tract infection. I am prescribing macrobid antibiotic for the probable infection. Hydrate well. I am sending out a urine culture. During the interim if your signs and symptoms worsen rather than improving please notify us. We will notify your when the culture results are back.  Follow up in 7 days or as needed.   Migraine Give toradol 60 mg im. Today in office. If ha worsens or changes then ED evaluation.

## 2015-04-24 NOTE — Progress Notes (Signed)
Pre visit review using our clinic review tool, if applicable. No additional management support is needed unless otherwise documented below in the visit note. 

## 2015-04-24 NOTE — Progress Notes (Signed)
Subjective:    Patient ID: Alisha Chen, female    DOB: Jun 06, 1966, 49 y.o.   MRN: [id number]  HPI   Pt in today reporting urinary symptoms.(2 days)  Dysuria- yes when starts and stops. Pt took azo. Frequent urination- yes  Hesitancy-no Suprapubic pressure-yes when urinates. Fever-low grade. 1100.5 chills-no Nausea-yes Vomiting-no CVA pain-no History of UTI-occasional. Gross hematuria-no  Also has ha started yesterday. Hx of migraines. Some light sensitive.Rt periobrital and her usual spot. No gross motor or sensory function deficits.  Review of Systems  Constitutional: Negative for fever, chills and fatigue.  HENT: Negative for congestion, drooling, facial swelling, mouth sores and postnasal drip.   Respiratory: Negative for cough, choking, chest tightness and wheezing.   Cardiovascular: Negative for chest pain and palpitations.  Genitourinary: Positive for dysuria and frequency. Negative for flank pain, vaginal bleeding, vaginal pain and pelvic pain.  Musculoskeletal: Negative for back pain.  Neurological: Positive for light-headedness and headaches. Negative for tremors, syncope, weakness and numbness.       Light sensitive.  Hematological: Negative for adenopathy. Does not bruise/bleed easily.  Psychiatric/Behavioral: Negative for behavioral problems and confusion.     Past Medical History  Diagnosis Date  . Back pain   . Hx: UTI (urinary tract infection)   . Status post chemotherapy 1 dose 05/23/12    Stopped after one cycle of Taxol due to Grade 2 Toxicity  . Status post chemotherapy 1 dose 05/23/12    Stopped after one cycle of Taxol due to Grade 2 Toxicity  . Breast cancer     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,dcis  . History of chemotherapy 03/23/12- 05/10/12    s/p 4 cycles of FEC   . S/P radiation therapy 07/10/12-08/23/12    Left Breast: 50 Gy/25 Fractions; Boost: 10 Gy/5 Fractions    History   Social History  . Marital Status:  Married    Spouse Name: N/A  . Number of Children: 1  . Years of Education: N/A   Occupational History  . teacher     [employer]  .  [place] History Main Topics  . Smoking status: Never Smoker   . Smokeless tobacco: Never Used  . Alcohol Use: No  . Drug Use: No  . Sexual Activity:    Partners: Male     Comment: menarche age 71,premenopausa; G!P1,1st pregnancy age 52   Other Topics Concern  . Not on file   Social History Narrative   Married x 21 years. Production assistant, radio at [employer]. 76 ten year old son    Past Surgical History  Procedure Laterality Date  . Spine surgery  01/2011  . Foot surgery      multiple  . Back surgery      LUMBAR DECOMPRESSION  . Portacath placement  02/10/2012    Procedure: INSERTION PORT-A-CATH;  Surgeon: Rolm Bookbinder, MD;  Location: [place];  Service: General;  Laterality: Right;  . Needle core biospy  01/09/12    Left Breast: Invasive Ductal Carcinoma, Lymph Node Axilla: Metastatic Mammary Carcinoma  . Breast lumpectomy  02/10/12    Left Breast: 2 Foci of Invasive Ductal Caand High grade Ductal Carcinoma In Situ, 0/14 nodes Left Axilla Negative : Regional Resection of Lymph Nodes: 0/5 Nodes Negative  . Regional resection  03/06/12    Left Axilla: 1/3 Nodes Metastatic Carcinoma  . Intrauterine device removal  01/24/12  . Breast biopsy  01/09/2012  left breast 3 0'clock/ER?PR =positive,her 2 neg  . Axillary surgery  03/06/12    left, regional resection, 1/3 nodes pos    Family History  Problem Relation Age of Onset  . Diabetes Mother   . Seizures Son     Allergies  Allergen Reactions  . Penicillins Other (See Comments)    Not sure, but was told by mother never to take this medication as a child  . Shellfish-Derived Products Other (See Comments)    Welts  . Clarithromycin Other (See Comments)    REACTION: NAUSEA,WEAK,BITTER TASTE  . Ondansetron Nausea And Vomiting    Headaches     Current Outpatient Prescriptions on File Prior to Visit  Medication Sig Dispense Refill  . cholecalciferol (VITAMIN D) 1000 UNITS tablet Take 1,000 Units by mouth daily.    . diphenhydrAMINE (BENADRYL) 25 MG tablet Take 25 mg by mouth every 6 (six) hours as needed for itching.    . fluticasone (FLONASE) 50 MCG/ACT nasal spray Place 2 sprays into both nostrils daily. 16 g 6  . vitamin B-12 (CYANOCOBALAMIN) 1000 MCG tablet Take 1,000 mcg by mouth daily.     No current facility-administered medications on file prior to visit.    BP 122/77 mmHg  Pulse 80  Temp(Src) 98 F (36.7 C) (Oral)  Ht 5\' 7"  (1.702 m)  Wt 187 lb 12.8 oz (85.186 kg)  BMI 29.41 kg/m2  SpO2 96%       Objective:   Physical Exam  General  Mental Status- Alert. Orientation- Orientation x 4.   Skin General:- Normal. Moisture- Dry. Temperature- Warm.  HEENT Head- normal.  Neck Neck- Supple.  Heart Ausculation-RRR  Lungs Ausculation- Clear, even, unlabored bilaterlly.    Abdomen Palpation/Percussion: Palpation and Percussion of the abdomen reveal- Non Tender, No Rebound tenderness, No Rigidity(guarding), No Palpable abdominal masses and No jar tenderness. No suprapubic tenderness. Liver:-Normal. Spleen:- Normal. Other Characteristics- No Costovertebral angle tenderness- Left or Costovertebral angle tenderness- Right.  Auscultation: Auscultation of the abdomen reveals- Bowel Sounds normal.  Back- no cva tenderness   Neurologic Cranial Nerve exam:- CN III-XII intact(No nystagmus), symmetric smile. Drift Test:- No drift. Finger to Nose:- Normal/Intact Strength:- 5/5 equal and symmetric strength both upper and lower extremities.    Assessment & Plan:

## 2015-04-24 NOTE — Assessment & Plan Note (Signed)
Your appear to have a urinary tract infection . I am prescribing macrobid  antibiotic for the probable infection. Hydrate well. I am sending out a urine culture. During the interim if your signs and symptoms worsen rather than improving please notify us. We will notify your when the culture results are back.  Follow up in 7 days or as needed. 

## 2015-04-24 NOTE — Addendum Note (Signed)
Addended by: Bunnie Domino on: 04/24/2015 01:32 PM   Modules accepted: Orders

## 2015-04-24 NOTE — Assessment & Plan Note (Signed)
Give toradol 60 mg im. Today in office. If ha worsens or changes then ED evaluation.

## 2015-04-27 ENCOUNTER — Telehealth: Payer: Self-pay | Admitting: Medical

## 2015-04-27 LAB — URINE CULTURE

## 2015-04-27 MED ORDER — CIPROFLOXACIN HCL 500 MG PO TABS: 500.0000 mg | ORAL_TABLET | Freq: Two times a day (BID) | ORAL | Status: DC

## 2015-04-27 NOTE — Telephone Encounter (Signed)
rx cipro and dc macrobid due to resistant organism.

## 2015-04-28 NOTE — Telephone Encounter (Signed)
Pt received message about Cipro. She is on the way to pick it up. She should start using it this evening or wait until tomorrow morning? She is now having a pain on the left side. Do you think it is related to UTI? Please call her at [phone].

## 2015-04-28 NOTE — Telephone Encounter (Signed)
Left message on patients answering machine regarding Cipro to pharmacy D/C Macrobid.

## 2015-04-29 NOTE — Telephone Encounter (Signed)
Called patient,states she started Cipro this am. Advised to go to ED if pain continues after taking CIPRO.

## 2015-04-29 NOTE — Telephone Encounter (Signed)
Advise take cipro. Back pain can be associated with uti if infections gets in kidney. Start cipro today. If back pain worsens be seen her or ED if over weekend.

## 2015-05-13 NOTE — Assessment & Plan Note (Signed)
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 The primary tumor revealed 2 foci measuring 1.3 and 0.8 cm.with 1/22 LN positive for metastatic disease. She has completed 4 cycles of FEC 100.followed by Taxol. Could not tolerate tamoxifen for more than 3 weeks. Started on Anastrozole 1 mg daily started 12/25/14  Anastrozole toxicities:   Breast Cancer Surveillance: 1. Breast exam 05/14/15 Normal 2. Mammogram 10/28/14 No abnormalities. Postsurgical changes. Breast Density Category B. I recommended that she get 3-D mammograms for surveillance. Discussed the differences between different breast density categories.

## 2015-05-14 ENCOUNTER — Ambulatory Visit (HOSPITAL_BASED_OUTPATIENT_CLINIC_OR_DEPARTMENT_OTHER): Payer: BC Managed Care – PPO | Admitting: Hematology and Oncology

## 2015-05-14 ENCOUNTER — Telehealth: Payer: Self-pay | Admitting: Hematology and Oncology

## 2015-05-14 ENCOUNTER — Encounter: Payer: Self-pay | Admitting: Hematology and Oncology

## 2015-05-14 VITALS — BP 123/66 | HR 68 | Temp 98.2°F | Resp 18 | Ht 67.0 in | Wt 183.7 lb

## 2015-05-14 DIAGNOSIS — Z853 Personal history of malignant neoplasm of breast: Secondary | ICD-10-CM

## 2015-05-14 DIAGNOSIS — C50412 Malignant neoplasm of upper-outer quadrant of left female breast: Secondary | ICD-10-CM

## 2015-05-14 NOTE — Telephone Encounter (Signed)
Gave avs & calendar for June 2017 °

## 2015-05-14 NOTE — Progress Notes (Signed)
Patient Care Team: [name], DO as PCP - General (Family Medicine)  DIAGNOSIS: Primary cancer of upper outer quadrant of left female breast   Staging form: Breast, AJCC 7th Edition     Clinical: Stage IIA (T1c, N1, cM0) - Signed by Eppie Gibson, MD on 01/18/2012     Pathologic: No stage assigned - Unsigned   SUMMARY OF ONCOLOGIC HISTORY:   Primary cancer of upper outer quadrant of left female breast   03/12/2012 Surgery left breast lumpectomy that revealed 2 foci of invasive ductal carcinoma measuring 1.3 cm (ER87%,PR 100% Ki 40%)and 0.8 cm (Er96%, PR 99% Ki 74%)grade 2 with adjacent high-grade ductal carcinoma in situ with no evidence of angiolymphatic invasion.1/22 LN   03/23/2012 - 05/10/2012 Chemotherapy Adjuvant chemo FEC 100 foll by Taxol X 12   07/10/2012 - 08/23/2012 Radiation Therapy Adj XRT   09/14/2012 - 10/05/2012 Anti-estrogen oral therapy Tamoxifen 20 mg 3 weeks stopped because of intolerance    CHIEF COMPLIANT: Follow-up of breast cancer   INTERVAL HISTORY: BRANAE CRAIL is a 49 year old with above-mentioned history of breast cancer treated with adjuvant chemotherapy and radiation and would not tolerate tamoxifen. She is currently in observation. Last visit we provided her prescription for anastrozole since she was postmenopausal but she did not want to take it. She is trying to stay healthy and lose some weight and stay active.  REVIEW OF SYSTEMS:   Constitutional: Denies fevers, chills or abnormal weight loss Eyes: Denies blurriness of vision Ears, nose, mouth, throat, and face: Denies mucositis or sore throat Respiratory: Denies cough, dyspnea or wheezes Cardiovascular: Denies palpitation, chest discomfort or lower extremity swelling Gastrointestinal:  Denies nausea, heartburn or change in bowel habits Skin: Denies abnormal skin rashes Lymphatics: Denies new lymphadenopathy or easy bruising Neurological:Denies numbness, tingling or new weaknesses Behavioral/Psych:  Mood is stable, no new changes  Breast:  denies any pain or lumps or nodules in either breasts All other systems were reviewed with the patient and are negative.  I have reviewed the past medical history, past surgical history, social history and family history with the patient and they are unchanged from previous note.  ALLERGIES:  is allergic to penicillins; shellfish-derived products; clarithromycin; and ondansetron.  MEDICATIONS:  Current Outpatient Prescriptions  Medication Sig Dispense Refill  . cholecalciferol (VITAMIN D) 1000 UNITS tablet Take 1,000 Units by mouth daily.    . diphenhydrAMINE (BENADRYL) 25 MG tablet Take 25 mg by mouth every 6 (six) hours as needed for itching.    . vitamin B-12 (CYANOCOBALAMIN) 1000 MCG tablet Take 1,000 mcg by mouth daily.     No current facility-administered medications for this visit.    PHYSICAL EXAMINATION: ECOG PERFORMANCE STATUS: 1 - Symptomatic but completely ambulatory  Filed Vitals:   05/14/15 1358  BP: 123/66  Pulse: 68  Temp: 98.2 F (36.8 C)  Resp: 18   Filed Weights   05/14/15 1358  Weight: 183 lb 11.2 oz (83.326 kg)    GENERAL:alert, no distress and comfortable SKIN: skin color, texture, turgor are normal, no rashes or significant lesions EYES: normal, Conjunctiva are pink and non-injected, sclera clear OROPHARYNX:no exudate, no erythema and lips, buccal mucosa, and tongue normal  NECK: supple, thyroid normal size, non-tender, without nodularity LYMPH:  no palpable lymphadenopathy in the cervical, axillary or inguinal LUNGS: clear to auscultation and percussion with normal breathing effort HEART: regular rate & rhythm and no murmurs and no lower extremity edema ABDOMEN:abdomen soft, non-tender and normal bowel sounds Musculoskeletal:no cyanosis of  digits and no clubbing  NEURO: alert & oriented x 3 with fluent speech, no focal motor/sensory deficits BREAST: No palpable masses or nodules in either right or left  breasts. No palpable axillary supraclavicular or infraclavicular adenopathy no breast tenderness or nipple discharge. (exam performed in the presence of a chaperone)  LABORATORY DATA:  I have reviewed the data as listed   Chemistry      Component Value Date/Time   NA 140 12/25/2014 1349   NA 139 08/14/2014 0933   K 4.4 12/25/2014 1349   K 4.6 08/14/2014 0933   CL 104 08/14/2014 0933   CL 105 12/21/2012 1527   CO2 27 12/25/2014 1349   CO2 29 08/14/2014 0933   BUN 22.5 12/25/2014 1349   BUN 15 08/14/2014 0933   CREATININE 0.9 12/25/2014 1349   CREATININE 0.9 08/14/2014 0933      Component Value Date/Time   CALCIUM 10.2 12/25/2014 1349   CALCIUM 10.6* 08/14/2014 0933   ALKPHOS 116 12/25/2014 1349   ALKPHOS 110 08/14/2014 0933   AST 19 12/25/2014 1349   AST 19 08/14/2014 0933   ALT 19 12/25/2014 1349   ALT 18 08/14/2014 0933   BILITOT 0.28 12/25/2014 1349   BILITOT 0.4 08/14/2014 0933       Lab Results  Component Value Date   WBC 5.1 12/25/2014   HGB 11.7 12/25/2014   HCT 35.9 12/25/2014   MCV 89.1 12/25/2014   PLT 175 12/25/2014   NEUTROABS 3.0 12/25/2014   ASSESSMENT & PLAN:  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 The primary tumor revealed 2 foci measuring 1.3 and 0.8 cm.with 1/22 LN positive for metastatic disease. She has completed 4 cycles of FEC 100.followed by Taxol. Could not tolerate tamoxifen for more than 3 weeks. We gave her a prescription for anastrozole but she does not want to take it   Breast Cancer Surveillance: 1. Breast exam 05/14/15 Normal 2. Mammogram 10/28/14 No abnormalities. Postsurgical changes. Breast Density Category B. I recommended that she get 3-D mammograms for surveillance. Discussed the differences between different breast density categories.   Survivorship: Discussed the importance of physical exercise  in decreasing the likelihood of breast cancer recurrence. Recommended 30 mins daily 6 days a week of either brisk walking or cycling or swimming. Encouraged patient to eat more fruits and vegetables and decrease red meat.   Return to clinic in 1 year for follow-up  No orders of the defined types were placed in this encounter.   The patient has a good understanding of the overall plan. she agrees with it. she will call with any problems that may develop before the next visit here.   Rulon Eisenmenger, MD

## 2015-07-14 ENCOUNTER — Telehealth: Payer: Self-pay | Admitting: Family Medicine

## 2015-07-14 NOTE — Telephone Encounter (Signed)
Caller name:Chrisanne Toscano Relationship to patient: Self  Can be reached: [phone] Pharmacy:  Reason for call: pt says that she is returning your call for lab results. Please call back to advise.

## 2015-07-14 NOTE — Telephone Encounter (Signed)
Message left to call the office     KP   Notes Recorded by Ewing Schlein, CMA on 07/14/2015 at 12:00 PM msg left on machine @[phone] [facility]) Notes Recorded by [name], DO on 07/14/2015 at 10:48 AM Normal chest

## 2015-08-20 ENCOUNTER — Other Ambulatory Visit: Payer: Self-pay | Admitting: Hematology and Oncology

## 2015-08-20 DIAGNOSIS — Z853 Personal history of malignant neoplasm of breast: Secondary | ICD-10-CM

## 2015-11-12 ENCOUNTER — Ambulatory Visit
Admission: RE | Admit: 2015-11-12 | Discharge: 2015-11-12 | Disposition: A | Discharge status: Home / Self Care | Payer: BC Managed Care – PPO | Source: Ambulatory Visit | Attending: Hematology and Oncology | Admitting: Hematology and Oncology

## 2015-11-12 DIAGNOSIS — Z853 Personal history of malignant neoplasm of breast: Secondary | ICD-10-CM

## 2016-04-30 ENCOUNTER — Ambulatory Visit (INDEPENDENT_AMBULATORY_CARE_PROVIDER_SITE_OTHER): Payer: BC Managed Care – PPO | Admitting: Osteopathic Medicine

## 2016-04-30 VITALS — BP 102/70 | HR 74 | Temp 97.7°F | Resp 14 | Ht 65.0 in | Wt 190.0 lb

## 2016-04-30 DIAGNOSIS — R51 Headache: Secondary | ICD-10-CM | POA: Diagnosis not present

## 2016-04-30 DIAGNOSIS — R519 Headache, unspecified: Secondary | ICD-10-CM

## 2016-04-30 DIAGNOSIS — J029 Acute pharyngitis, unspecified: Secondary | ICD-10-CM

## 2016-04-30 MED ORDER — KETOROLAC TROMETHAMINE 60 MG/2ML IM SOLN
60.0000 mg | Freq: Once | INTRAMUSCULAR | Status: AC
  Administered 2016-04-30: 60 mg via INTRAMUSCULAR

## 2016-04-30 MED ORDER — LIDOCAINE VISCOUS HCL 2 % MT SOLN: 15.0000 mL | OROMUCOSAL | Status: DC | PRN

## 2016-04-30 MED ORDER — BUTALBITAL-APAP-CAFFEINE 50-325-40 MG PO TABS: 1.0000 | ORAL_TABLET | Freq: Two times a day (BID) | ORAL | Status: DC | PRN

## 2016-04-30 NOTE — Progress Notes (Signed)
HPI: Alisha Chen is a 50 y.o. female who presents to [facility] [place] today for chief complaint of:  Chief Complaint  Patient presents with  . Sore Throat    x 4 days  . Migraine    Started today    SORE THROAT . Location: upper throat on R side  . Context: was eating popcorn few days ago, ended up with tickling and discomfort in the back of the throat, vomited a few times then had severe sore throat  . Quality: sharp . Duration: started Thursday after popcorn, then was better but came back yesterday evening and today . Modifying factors: tried tea, gargle salt water  . Assoc signs/symptoms: no bloody vomit, sore swallowing but nonpainful  HEADACHE  . Location: Over R eye and now L eye . Timing: "frequently"  . Modifying factors: usually take Benadryl or PM excedrin, has been treated with injections at ER and other urgent care, milder today, just started in the waiting room  . Assoc signs/symptoms: very mild nausea      Past medical, social and family history reviewed: Past Medical History  Diagnosis Date  . Back pain   . Hx: UTI (urinary tract infection)   . Status post chemotherapy 1 dose 05/23/12    Stopped after one cycle of Taxol due to Grade 2 Toxicity  . Status post chemotherapy 1 dose 05/23/12    Stopped after one cycle of Taxol due to Grade 2 Toxicity  . Breast cancer ([place])     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,dcis  . History of chemotherapy 03/23/12- 05/10/12    s/p 4 cycles of FEC   . S/P radiation therapy 07/10/12-08/23/12    Left Breast: 50 Gy/25 Fractions; Boost: 10 Gy/5 Fractions   Past Surgical History  Procedure Laterality Date  . Spine surgery  01/2011  . Foot surgery      multiple  . Back surgery      LUMBAR DECOMPRESSION  . Portacath placement  02/10/2012    Procedure: INSERTION PORT-A-CATH;  Surgeon: Rolm Bookbinder, MD;  Location: [facility];  Service: General;  Laterality: Right;  . Needle  core biospy  01/09/12    Left Breast: Invasive Ductal Carcinoma, Lymph Node Axilla: Metastatic Mammary Carcinoma  . Breast lumpectomy  02/10/12    Left Breast: 2 Foci of Invasive Ductal Caand High grade Ductal Carcinoma In Situ, 0/14 nodes Left Axilla Negative : Regional Resection of Lymph Nodes: 0/5 Nodes Negative  . Regional resection  03/06/12    Left Axilla: 1/3 Nodes Metastatic Carcinoma  . Intrauterine device removal  01/24/12  . Breast biopsy  01/09/2012    left breast 3 0'clock/ER?PR =positive,her 2 neg  . Axillary surgery  03/06/12    left, regional resection, 1/3 nodes pos   Social History  Substance Use Topics  . Smoking status: Never Smoker   . Smokeless tobacco: Never Used  . Alcohol Use: No   Family History  Problem Relation Age of Onset  . Diabetes Mother   . Seizures Son     Current Outpatient Prescriptions  Medication Sig Dispense Refill  . cholecalciferol (VITAMIN D) 1000 UNITS tablet Take 1,000 Units by mouth daily.    . vitamin B-12 (CYANOCOBALAMIN) 1000 MCG tablet Take 1,000 mcg by mouth daily.    . diphenhydrAMINE (BENADRYL) 25 MG tablet Take 25 mg by mouth every 6 (six) hours as needed for itching. Reported on 04/30/2016     No  current facility-administered medications for this visit.   Allergies  Allergen Reactions  . Penicillins Other (See Comments)    Not sure, but was told by mother never to take this medication as a child  . Shellfish-Derived Products Other (See Comments)    Welts  . Clarithromycin Other (See Comments)    REACTION: NAUSEA,WEAK,BITTER TASTE  . Ondansetron Nausea And Vomiting    Headaches      Review of Systems: CONSTITUTIONAL:  No  fever, no chills HEAD/EYES/EARS/NOSE/THROAT: (+) headache, no vision change, no hearing change, (+) sore throat, No  sinus pressure - se HPI CARDIAC: No  chest pain, No  pressure RESPIRATORY: No  cough GASTROINTESTINAL: No  nausea, No  vomiting, No  abdominal pain, No  blood in stool, No  diarrhea, No   constipation  SKIN: No  rash/wounds/concerning lesions NEUROLOGIC: No  weakness, No  dizziness, No  slurred speech   Exam:  BP 102/70 mmHg  Pulse 74  Temp(Src) 97.7 F (36.5 C) (Oral)  Resp 14  Ht 5\' 5"  (1.651 m)  Wt 190 lb (86.183 kg)  BMI 31.62 kg/m2  SpO2 97% Constitutional: VS see above. General Appearance: alert, well-developed, well-nourished, NAD Eyes: Normal lids and conjunctive, non-icteric sclera Ears, Nose, Mouth, Throat: MMM, Normal external inspection ears/nares/mouth/lips/gums, TM normal bilaterally. Pharynx no erythema, no exudate. Tonsils surgically absent. No injury/laceration or FB i can see  Neck: No masses, trachea midline. No thyroid enlargement/tenderness/mass appreciated. No lymphadenopathy Respiratory: Normal respiratory effort. no wheeze, no rhonchi, no rales Cardiovascular: S1/S2 normal, no murmur, no rub/gallop auscultated. RRR.   Neurological: No cranial nerve deficit on limited exam. Motor and sensation intact and symmetric, PERRLA EOMI normal neck ROM Skin: warm, dry, intact. No rash/ulcer.   Reviewed 12/2015 CMP normal renal fxn    ASSESSMENT/PLAN:  Sore throat - likely scratch/injury due to popcorn kernel husk, no visible FB, no sign infection, oral lidocaine prn - Plan: Lidocaine HCl 2 % SOLN  Headache, unspecified headache type - not strong indications for migraine though possible variant, trial PRN esgic and consider triptans or preventive therapy if no improvement - Plan: butalbital-acetaminophen-caffeine (FIORICET, ESGIC) 50-325-40 MG tablet, ketorolac (TORADOL) injection 60 mg   Visit summary printed and instructions reviewed with the patient. All questions answered. ER/RTC precautions reviewed.  Return if symptoms worsen or fail to improve.

## 2016-04-30 NOTE — Patient Instructions (Addendum)
  Return if worse or if you are using headache medication frequently. Try your other usual headache remedies fist and save prescription for severe pain to avoid dependence/rebound headache.       IF you received an x-ray today, you will receive an invoice from [facility]. Please contact [facility] at [phone] with questions or concerns regarding your invoice.   IF you received labwork today, you will receive an invoice from [employer]. Please contact [facility] at [phone] with questions or concerns regarding your invoice.   Our billing staff will not be able to assist you with questions regarding bills from these companies.  You will be contacted with the lab results as soon as they are available. The fastest way to get your results is to activate your My Chart account. Instructions are located on the last page of this paperwork. If you have not heard from [place] regarding the results in 2 weeks, please contact this office.

## 2016-05-12 ENCOUNTER — Ambulatory Visit: Payer: BC Managed Care – PPO | Admitting: Hematology and Oncology

## 2016-05-12 NOTE — Assessment & Plan Note (Signed)
Stage II a (T1cN1) invasive ductal carcinoma status post left lumpectomy with excellent lymph node dissection. The primary tumor revealed 2 foci measuring 1.3 and 0.8 cm.with 1/22 LN positive for metastatic disease. She has completed 4 cycles of FEC 100.followed by Taxol. Could not tolerate tamoxifen for more than 3 weeks. We gave her a prescription for anastrozole but she does not want to take it  Breast Cancer Surveillance: 1. Breast exam 05/12/2016 Normal 2. Mammogram 11/12/2015 No abnormalities. Postsurgical changes. Breast Density Category B. I recommended that she get 3-D mammograms for surveillance. Discussed the differences between different breast density categories.  Survivorship: Discussed the importance of physical exercise in decreasing the likelihood of breast cancer recurrence. Recommended 30 mins daily 6 days a week of either brisk walking or cycling or swimming. Encouraged patient to eat more fruits and vegetables and decrease red meat.   Return to clinic in 1 year for follow-up and after that we can graduate her to survivorship clinic

## 2016-05-16 ENCOUNTER — Ambulatory Visit (HOSPITAL_BASED_OUTPATIENT_CLINIC_OR_DEPARTMENT_OTHER): Payer: BC Managed Care – PPO | Admitting: Hematology and Oncology

## 2016-05-16 ENCOUNTER — Encounter: Payer: Self-pay | Admitting: Hematology and Oncology

## 2016-05-16 ENCOUNTER — Telehealth: Payer: Self-pay | Admitting: Hematology and Oncology

## 2016-05-16 VITALS — BP 120/78 | HR 86 | Temp 97.9°F | Resp 18 | Wt 188.5 lb

## 2016-05-16 DIAGNOSIS — Z853 Personal history of malignant neoplasm of breast: Secondary | ICD-10-CM

## 2016-05-16 DIAGNOSIS — C50412 Malignant neoplasm of upper-outer quadrant of left female breast: Secondary | ICD-10-CM

## 2016-05-16 NOTE — Progress Notes (Signed)
Patient Care Team: [name], DO as PCP - General (Family Medicine)  DIAGNOSIS: Primary cancer of upper outer quadrant of left female breast [facility])   Staging form: Breast, AJCC 7th Edition     Clinical: Stage IIA (T1c, N1, cM0) - Signed by Eppie Gibson, MD on 01/18/2012     Pathologic: No stage assigned - Unsigned   SUMMARY OF ONCOLOGIC HISTORY:   Primary cancer of upper outer quadrant of left female breast (Alisha Chen)   03/12/2012 Surgery left breast lumpectomy that revealed 2 foci of invasive ductal carcinoma measuring 1.3 cm (ER87%,PR 100% Ki 40%)and 0.8 cm (Er96%, PR 99% Ki 74%)grade 2 with adjacent high-grade ductal carcinoma in situ with no evidence of angiolymphatic invasion.1/22 LN   03/23/2012 - 05/10/2012 Chemotherapy Adjuvant chemo FEC 100 foll by Taxol X 12   07/10/2012 - 08/23/2012 Radiation Therapy Adj XRT   09/14/2012 - 10/05/2012 Anti-estrogen oral therapy Tamoxifen 20 mg 3 weeks stopped because of intolerance    CHIEF COMPLIANT: Patient is here for surveillance of breast cancer  INTERVAL HISTORY: Alisha Chen is a 50 year old with above-mentioned history left breast cancer who got chemotherapy and could not tolerate tamoxifen and is here for annual follow-up. She reports no new problems or concerns. She is trying to lose weight. Denies any lumps or nodules in the breasts.  REVIEW OF SYSTEMS:   Constitutional: Denies fevers, chills or abnormal weight loss Eyes: Denies blurriness of vision Ears, nose, mouth, throat, and face: Denies mucositis or sore throat Respiratory: Denies cough, dyspnea or wheezes Cardiovascular: Denies palpitation, chest discomfort Gastrointestinal:  Denies nausea, heartburn or change in bowel habits Skin: Denies abnormal skin rashes Lymphatics: Denies new lymphadenopathy or easy bruising Neurological:Denies numbness, tingling or new weaknesses Behavioral/Psych: Mood is stable, no new changes  Extremities: No lower extremity edema Breast:   denies any pain or lumps or nodules in either breasts All other systems were reviewed with the patient and are negative.  I have reviewed the past medical history, past surgical history, social history and family history with the patient and they are unchanged from previous note.  ALLERGIES:  is allergic to penicillins; shellfish-derived products; clarithromycin; and ondansetron.  MEDICATIONS:  Current Outpatient Prescriptions  Medication Sig Dispense Refill  . butalbital-acetaminophen-caffeine (FIORICET, ESGIC) 50-325-40 MG tablet Take 1 tablet by mouth 2 (two) times daily as needed for headache (use sparingly to avoid dependence/rebound headache - max 2 tabs per day, 1 - 2 days per week)). 14 tablet 0  . cholecalciferol (VITAMIN D) 1000 UNITS tablet Take 1,000 Units by mouth daily.    . diphenhydrAMINE (BENADRYL) 25 MG tablet Take 25 mg by mouth every 6 (six) hours as needed for itching. Reported on 04/30/2016    . Lidocaine HCl 2 % SOLN Use as directed 15 mLs in the mouth or throat every 3 (three) hours as needed (mouth/throat pain - gargle and spit). 100 mL 0  . vitamin B-12 (CYANOCOBALAMIN) 1000 MCG tablet Take 1,000 mcg by mouth daily.     No current facility-administered medications for this visit.    PHYSICAL EXAMINATION: ECOG PERFORMANCE STATUS: 0 - Asymptomatic  Filed Vitals:   05/16/16 1042  BP: 120/78  Pulse: 86  Temp: 97.9 F (36.6 C)  Resp: 18   Filed Weights   05/16/16 1042  Weight: 188 lb 8 oz (85.503 kg)    GENERAL:alert, no distress and comfortable SKIN: skin color, texture, turgor are normal, no rashes or significant lesions EYES: normal, Conjunctiva are pink  and non-injected, sclera clear OROPHARYNX:no exudate, no erythema and lips, buccal mucosa, and tongue normal  NECK: supple, thyroid normal size, non-tender, without nodularity LYMPH:  no palpable lymphadenopathy in the cervical, axillary or inguinal LUNGS: clear to auscultation and percussion with  normal breathing effort HEART: regular rate & rhythm and no murmurs and no lower extremity edema ABDOMEN:abdomen soft, non-tender and normal bowel sounds MUSCULOSKELETAL:no cyanosis of digits and no clubbing  NEURO: alert & oriented x 3 with fluent speech, no focal motor/sensory deficits EXTREMITIES: No lower extremity edema BREAST: No palpable masses or nodules in either right or left breasts. No palpable axillary supraclavicular or infraclavicular adenopathy no breast tenderness or nipple discharge. (exam performed in the presence of a chaperone)  LABORATORY DATA:  I have reviewed the data as listed   Chemistry      Component Value Date/Time   NA 140 12/25/2014 1349   NA 139 08/14/2014 0933   K 4.4 12/25/2014 1349   K 4.6 08/14/2014 0933   CL 104 08/14/2014 0933   CL 105 12/21/2012 1527   CO2 27 12/25/2014 1349   CO2 29 08/14/2014 0933   BUN 22.5 12/25/2014 1349   BUN 15 08/14/2014 0933   CREATININE 0.9 12/25/2014 1349   CREATININE 0.9 08/14/2014 0933      Component Value Date/Time   CALCIUM 10.2 12/25/2014 1349   CALCIUM 10.6* 08/14/2014 0933   ALKPHOS 116 12/25/2014 1349   ALKPHOS 110 08/14/2014 0933   AST 19 12/25/2014 1349   AST 19 08/14/2014 0933   ALT 19 12/25/2014 1349   ALT 18 08/14/2014 0933   BILITOT 0.28 12/25/2014 1349   BILITOT 0.4 08/14/2014 0933       Lab Results  Component Value Date   WBC 5.1 12/25/2014   HGB 11.7 12/25/2014   HCT 35.9 12/25/2014   MCV 89.1 12/25/2014   PLT 175 12/25/2014   NEUTROABS 3.0 12/25/2014   ASSESSMENT & PLAN:  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 The primary tumor revealed 2 foci measuring 1.3 and 0.8 cm.with 1/22 LN positive for metastatic disease. She has completed 4 cycles of FEC 100.followed by Taxol. Could not tolerate tamoxifen for more than 3 weeks. We gave her a prescription for  anastrozole but she does not want to take it  Breast Cancer Surveillance: 1. Breast exam 05/16/2016 Normal 2. Mammogram 11/12/2015 benign. Postsurgical changes. Breast Density Category B. I recommended that she get 3-D mammograms for surveillance.  Weight issues: Patient is exercising for an hour every day. In spite of this she is not losing weight. I discussed with her the concept of intermittent fasting and she is interested in it and will consider it.  Return to clinic in 1 year for surveillance and after that we can refer her to survivorship   No orders of the defined types were placed in this encounter.   The patient has a good understanding of the overall plan. she agrees with it. she will call with any problems that may develop before the next visit here.   Rulon Eisenmenger, MD 05/16/2016

## 2016-05-16 NOTE — Telephone Encounter (Signed)
appt made and avs printed °

## 2016-05-16 NOTE — Assessment & Plan Note (Signed)
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 The primary tumor revealed 2 foci measuring 1.3 and 0.8 cm.with 1/22 LN positive for metastatic disease. She has completed 4 cycles of FEC 100.followed by Taxol. Could not tolerate tamoxifen for more than 3 weeks. We gave her a prescription for anastrozole but she does not want to take it  Breast Cancer Surveillance: 1. Breast exam 05/16/2016 Normal 2. Mammogram 11/12/2015 benign. Postsurgical changes. Breast Density Category B. I recommended that she get 3-D mammograms for surveillance.  Return to clinic in 1 year for surveillance and after that we can refer her to survivorship

## 2016-05-30 ENCOUNTER — Ambulatory Visit (INDEPENDENT_AMBULATORY_CARE_PROVIDER_SITE_OTHER): Payer: BC Managed Care – PPO | Admitting: Family Medicine

## 2016-05-30 ENCOUNTER — Other Ambulatory Visit: Payer: Self-pay

## 2016-05-30 ENCOUNTER — Encounter: Payer: Self-pay | Admitting: Family Medicine

## 2016-05-30 VITALS — BP 120/80 | HR 76 | Wt 187.0 lb

## 2016-05-30 DIAGNOSIS — M25532 Pain in left wrist: Secondary | ICD-10-CM | POA: Diagnosis not present

## 2016-05-30 DIAGNOSIS — M778 Other enthesopathies, not elsewhere classified: Secondary | ICD-10-CM

## 2016-05-30 NOTE — Assessment & Plan Note (Signed)
Patient given injection today and tolerated the procedure well. We discussed icing regimen. Patient will wear the brace again on a more regular basis over the course the next 3 weeks. We discussed home exercises with patient states that she has. Discussed differential includes carpal tunnel syndrome but no significant numbness. We discussed ergonomics with typing. Patient come back again in 4 weeks for further evaluation.

## 2016-05-30 NOTE — Patient Instructions (Signed)
Good to see you  Ice 20 minutes 2 times daily. Usually after activity and before bed. Wear brace day and night for 1 week then nightly for 2 weeks.  Stay active Try to type with your elbows above your wrists.

## 2016-05-30 NOTE — Progress Notes (Signed)
[facility] [address], [place] [address] Phone: [phone] Subjective:     CC: Left wrist pain followup  [id number] Alisha Chen is a 50 y.o. female coming in with complaint of left wrist pain. Has been 2 years since we have seen patient. Patient states that she had been doing relatively well until the beginning of last month. Then started having swelling again on the dorsal aspect of the wrist. States that it is slowly improving again. Patient does not know of any true injury. Patient was doing more typing than usual. Patient denies any numbness but states that she was having soreness in the hand as well.  Past Medical History  Diagnosis Date  . Back pain   . Hx: UTI (urinary tract infection)   . Status post chemotherapy 1 dose 05/23/12    Stopped after one cycle of Taxol due to Grade 2 Toxicity  . Status post chemotherapy 1 dose 05/23/12    Stopped after one cycle of Taxol due to Grade 2 Toxicity  . 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 History of chemotherapy 03/23/12- 05/10/12    s/p 4 cycles of FEC   . S/P radiation therapy 07/10/12-08/23/12    Left Breast: 50 Gy/25 Fractions; Boost: 10 Gy/5 Fractions   Past Surgical History  Procedure Laterality Date  . Spine surgery  01/2011  . Foot surgery      multiple  . Back surgery      LUMBAR DECOMPRESSION  . Portacath placement  02/10/2012    Procedure: INSERTION PORT-A-CATH;  Surgeon: Rolm Bookbinder, MD;  Location: [facility];  Service: General;  Laterality: Right;  . Needle core biospy  01/09/12    Left Breast: Invasive Ductal Carcinoma, Lymph Node Axilla: Metastatic Mammary Carcinoma  . Breast lumpectomy  02/10/12    Left Breast: 2 Foci of Invasive Ductal Caand High grade Ductal Carcinoma In Situ, 0/14 nodes Left Axilla Negative : Regional Resection of Lymph Nodes: 0/5 Nodes Negative  . Regional resection  03/06/12    Left  Axilla: 1/3 Nodes Metastatic Carcinoma  . Intrauterine device removal  01/24/12  . Breast biopsy  01/09/2012    left breast 3 0'clock/ER?PR =positive,her 2 neg  . Axillary surgery  03/06/12    left, regional resection, 1/3 nodes pos   Social History  Substance Use Topics  . Smoking status: Never Smoker   . Smokeless tobacco: Never Used  . Alcohol Use: No   Allergies  Allergen Reactions  . Penicillins Other (See Comments)    Not sure, but was told by mother never to take this medication as a child  . Shellfish-Derived Products Other (See Comments)    Welts  . Clarithromycin Other (See Comments)    REACTION: NAUSEA,WEAK,BITTER TASTE  . Ondansetron Nausea And Vomiting    Headaches   Family History  Problem Relation Age of Onset  . Diabetes Mother   . Seizures Son        Past medical history, social, surgical and family history all reviewed in electronic medical record.   Review of Systems: No headache, visual changes, nausea, vomiting, diarrhea, constipation, dizziness, abdominal pain, skin rash, fevers, chills, night sweats, weight loss, swollen lymph nodes, body aches, joint swelling, muscle aches, chest pain, shortness of breath, mood changes.   Objective Blood pressure 120/80, pulse 76, weight 187 lb (84.823 kg).  General: No apparent distress alert and oriented x3  mood and affect normal, dressed appropriately.  HEENT: Pupils equal, extraocular movements intact  Respiratory: Patient's speak in full sentences and does not appear short of breath  Cardiovascular: No lower extremity edema, non tender, no erythema  Skin: Warm dry intact with no signs of infection or rash on extremities or on axial skeleton.  Abdomen: Soft nontender  Neuro: Cranial nerves II through XII are intact, neurovascularly intact in all extremities with 2+ DTRs and 2+ pulses.  Lymph: No lymphadenopathy of posterior or anterior cervical chain or axillae bilaterally.  Gait normal with good balance and  coordination.  MSK:  Non tender with full range of motion and good stability and symmetric strength and tone of shoulders, elbows,  hip, knee and ankles bilaterally.  Wrist: The left Inspection shows on patient's volar aspect of her wrist just proximal to the scaphoid and over the distal radius there is a soft masslike protrusions and seems to be fairly superficial distal, And previous exam ROM smooth and normal with good flexion and extension and ulnar/this is not fixed. Mild tenderness to palpation. Seems to be compressible more ropelike than previous.. Neurovascularly intact distally with good capillary refill. Good grip strength. deviation that is symmetrical with opposite wrist. Palpation is normal over metacarpals, navicular, lunate, and TFCC; tendons without tenderness/ swelling No snuffbox tenderness. No tenderness over Canal of Gu[place]. Strength 5/5 in all directions without pain. Negative Finkelstein, mild positive tinel's and phalens. Negative Watson's test. Contralateral wrist unremarkable  Procedure note After verbal consent patient was prepped with alcohol swabs and with a 25-gauge from a proximal to distal approach along the flexor tendon sheath patient was injected with 1 cc of 0.5% Marcaine and 0.5 cc of Kenalog 40 mg/dL within the tendon sheath. We were significantly proximal to the area of where the collaterals overlap this area. Patient tolerated the procedure well. Good capillary refill of the fingers 5 minutes after the injection. Patient had full function of the wrist and hand. Postinjection instructions given   Impression and Recommendations:     This case required medical decision making of moderate complexity.

## 2016-06-27 ENCOUNTER — Ambulatory Visit (INDEPENDENT_AMBULATORY_CARE_PROVIDER_SITE_OTHER): Payer: BC Managed Care – PPO | Admitting: Obstetrics and Gynecology

## 2016-06-27 ENCOUNTER — Encounter: Payer: Self-pay | Admitting: Obstetrics and Gynecology

## 2016-06-27 VITALS — BP 124/81 | HR 79 | Ht 67.0 in | Wt 187.0 lb

## 2016-06-27 DIAGNOSIS — Z01419 Encounter for gynecological examination (general) (routine) without abnormal findings: Secondary | ICD-10-CM

## 2016-06-27 NOTE — Progress Notes (Signed)
Subjective:     Alisha Chen is a 50 y.o. female G1P1 with BMI 29 who is here for a comprehensive physical exam. The patient reports no problems. She is sexually active using water-based lubricant without issues. Patient has a history of breast cancer s/p left lumpectomy and chemotherapy in 2013. She has been menopausal since 2013. Patient had a normal colonoscopy in 2015 with evidence of diverticulosis. She denies any abdominal/pelvic pain and denies vaginal bleeding/abnormal discharge. She denies any urinary incontinence. She continues her weight loss efforts with intermittent fasting as recommended by her oncologist.  Past Medical History:  Diagnosis Date  . Back pain   . 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 History of chemotherapy 03/23/12- 05/10/12   s/p 4 cycles of FEC   . Hx: UTI (urinary tract infection)   . S/P radiation therapy 07/10/12-08/23/12   Left Breast: 50 Gy/25 Fractions; Boost: 10 Gy/5 Fractions  . Status post chemotherapy 1 dose 05/23/12   Stopped after one cycle of Taxol due to Grade 2 Toxicity  . Status post chemotherapy 1 dose 05/23/12   Stopped after one cycle of Taxol due to Grade 2 Toxicity   Past Surgical History:  Procedure Laterality Date  . AXILLARY SURGERY  03/06/12   left, regional resection, 1/3 nodes pos  . BACK SURGERY     LUMBAR DECOMPRESSION  . BREAST BIOPSY  01/09/2012   left breast 3 0'clock/ER?PR =positive,her 2 neg  . BREAST LUMPECTOMY  02/10/12   Left Breast: 2 Foci of Invasive Ductal Caand High grade Ductal Carcinoma In Situ, 0/14 nodes Left Axilla Negative : Regional Resection of Lymph Nodes: 0/5 Nodes Negative  . FOOT SURGERY     multiple  . INTRAUTERINE DEVICE REMOVAL  01/24/12  . needle core Biospy  01/09/12   Left Breast: Invasive Ductal Carcinoma, Lymph Node Axilla: Metastatic Mammary Carcinoma  . PORTACATH PLACEMENT  02/10/2012   Procedure: INSERTION PORT-A-CATH;  Surgeon: Rolm Bookbinder, MD;   Location: [facility];  Service: General;  Laterality: Right;  . Regional Resection  03/06/12   Left Axilla: 1/3 Nodes Metastatic Carcinoma  . SPINE SURGERY  01/2011   Family History  Problem Relation Age of Onset  . Diabetes Mother   . Seizures Son    Social History   Social History  . Marital status: Married    Spouse name: N/A  . Number of children: 1  . Years of education: N/A   Occupational History  . teacher [employer]  .  [place] History Main Topics  . Smoking status: Never Smoker  . Smokeless tobacco: Never Used  . Alcohol use No  . Drug use: No  . Sexual activity: Yes    Partners: Male     Comment: menarche age 49,premenopausa; G!P1,1st pregnancy age 72   Other Topics Concern  . Not on file   Social History Narrative   Married x 21 years. Production assistant, radio at [employer]. 37 ten year old son   Health Maintenance  Topic Date Due  . HIV Screening  05/17/1980  . INFLUENZA VACCINE  06/21/2016  . MAMMOGRAM  11/11/2016  . PAP SMEAR  05/21/2017  . TETANUS/TDAP  09/19/2021  . COLONOSCOPY  09/09/2024       Review of Systems Pertinent items are noted in HPI.   Objective:  Blood pressure 124/81, pulse 79, height 5\' 7"  (1.702 m),  weight 187 lb (84.8 kg), last menstrual period 02/27/2012.     GENERAL: Well-developed, well-nourished female in no acute distress.  HEENT: Normocephalic, atraumatic. Sclerae anicteric.  NECK: Supple. Normal thyroid.  LUNGS: Clear to auscultation bilaterally.  HEART: Regular rate and rhythm. BREASTS: Symmetric in size. No palpable masses or lymphadenopathy, skin changes, or nipple drainage. ABDOMEN: Soft, nontender, nondistended. No organomegaly. PELVIC: Normal external female genitalia. Vagina is pink and rugated.  Normal discharge. Normal appearing cervix. Uterus is normal in size. No adnexal mass or tenderness. EXTREMITIES: No cyanosis, clubbing, or edema, 2+ distal  pulses.    Assessment:    Healthy female exam.      Plan:    pap smear collected Patient to follow up with oncologist for breast cancer surveillance as planned 3-D mammogram as planned later this year Patient will be contacted with any abnormal results RTC in 1 year or prn See After Visit Summary for Counseling Recommendations

## 2016-06-30 LAB — PAP IG W/ RFLX HPV ASCU: PAP SMEAR COMMENT: 0

## 2016-08-18 ENCOUNTER — Other Ambulatory Visit: Payer: Self-pay | Admitting: Family Medicine

## 2016-08-18 ENCOUNTER — Other Ambulatory Visit: Payer: Self-pay | Admitting: Hematology and Oncology

## 2016-08-18 DIAGNOSIS — Z1231 Encounter for screening mammogram for malignant neoplasm of breast: Secondary | ICD-10-CM

## 2016-08-19 ENCOUNTER — Other Ambulatory Visit: Payer: Self-pay | Admitting: Hematology and Oncology

## 2016-08-19 DIAGNOSIS — Z853 Personal history of malignant neoplasm of breast: Secondary | ICD-10-CM

## 2016-09-20 ENCOUNTER — Ambulatory Visit (INDEPENDENT_AMBULATORY_CARE_PROVIDER_SITE_OTHER): Payer: BC Managed Care – PPO | Admitting: Family Medicine

## 2016-09-20 ENCOUNTER — Encounter: Payer: Self-pay | Admitting: Family Medicine

## 2016-09-20 VITALS — BP 130/88 | HR 68 | Temp 98.6°F | Resp 16 | Ht 67.0 in | Wt 186.8 lb

## 2016-09-20 DIAGNOSIS — J0140 Acute pansinusitis, unspecified: Secondary | ICD-10-CM

## 2016-09-20 MED ORDER — LEVOCETIRIZINE DIHYDROCHLORIDE 5 MG PO TABS: 5.0000 mg | ORAL_TABLET | Freq: Every evening | ORAL | 5 refills | Status: DC

## 2016-09-20 MED ORDER — DOXYCYCLINE HYCLATE 100 MG PO TABS: 100.0000 mg | ORAL_TABLET | Freq: Two times a day (BID) | ORAL | 0 refills | Status: DC

## 2016-09-20 NOTE — Progress Notes (Signed)
Pre visit review using our clinic review tool, if applicable. No additional management support is needed unless otherwise documented below in the visit note. 

## 2016-09-20 NOTE — Patient Instructions (Signed)

## 2016-09-20 NOTE — Progress Notes (Signed)
Patient ID: Alisha Chen, female    DOB: 09-10-1966  Age: 50 y.o. MRN: [id number]    Subjective:  Subjective  HPI Alisha Chen presents for sinusitis x 1 week.  She has been taking sudafed with some relief-- she then started with pnd and sore throat.  Some sinus headache + cough-- dry No fever,  No chills.   + yellow / green mucous  Review of Systems  Constitutional: Positive for chills. Negative for fever.  HENT: Positive for congestion, postnasal drip, rhinorrhea, sinus pressure, sneezing and sore throat.   Respiratory: Positive for cough, chest tightness, shortness of breath and wheezing.   Cardiovascular: Negative for chest pain, palpitations and leg swelling.  Allergic/Immunologic: Negative for environmental allergies.    History Past Medical History:  Diagnosis Date  . Back pain   . 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 History of chemotherapy 03/23/12- 05/10/12   s/p 4 cycles of FEC   . Hx: UTI (urinary tract infection)   . S/P radiation therapy 07/10/12-08/23/12   Left Breast: 50 Gy/25 Fractions; Boost: 10 Gy/5 Fractions  . Status post chemotherapy 1 dose 05/23/12   Stopped after one cycle of Taxol due to Grade 2 Toxicity  . Status post chemotherapy 1 dose 05/23/12   Stopped after one cycle of Taxol due to Grade 2 Toxicity    She has a past surgical history that includes Spine surgery (01/2011); Foot surgery; Back surgery; Portacath placement (02/10/2012); needle core Biospy (01/09/12); Breast lumpectomy (02/10/12); Regional Resection (03/06/12); Intrauterine device removal (01/24/12); Breast biopsy (01/09/2012); and Axillary Surgery (03/06/12).   Her family history includes Diabetes in her mother; Seizures in her son.She reports that she has never smoked. She has never used smokeless tobacco. She reports that she does not drink alcohol or use drugs.  Current Outpatient  Prescriptions on File Prior to Visit  Medication Sig Dispense Refill  . butalbital-acetaminophen-caffeine (FIORICET, ESGIC) 50-325-40 MG tablet Take 1 tablet by mouth 2 (two) times daily as needed for headache (use sparingly to avoid dependence/rebound headache - max 2 tabs per day, 1 - 2 days per week)). 14 tablet 0  . cholecalciferol (VITAMIN D) 1000 UNITS tablet Take 1,000 Units by mouth daily.    . diphenhydrAMINE (BENADRYL) 25 MG tablet Take 25 mg by mouth every 6 (six) hours as needed for itching. Reported on 04/30/2016    . vitamin B-12 (CYANOCOBALAMIN) 1000 MCG tablet Take 1,000 mcg by mouth daily.    . Lidocaine HCl 2 % SOLN Use as directed 15 mLs in the mouth or throat every 3 (three) hours as needed (mouth/throat pain - gargle and spit). (Patient not taking: Reported on 09/20/2016) 100 mL 0   No current facility-administered medications on file prior to visit.      Objective:  Objective  Physical Exam  Constitutional: She is oriented to person, place, and time. She appears well-nourished.  HENT:  Nose: Mucosal edema, rhinorrhea and sinus tenderness present. No nasal deformity. Right sinus exhibits maxillary sinus tenderness and frontal sinus tenderness. Left sinus exhibits maxillary sinus tenderness and frontal sinus tenderness.  Mouth/Throat: Oropharynx is clear and moist and mucous membranes are normal. No oropharyngeal exudate.  Neck: Normal range of motion. Neck supple.  Cardiovascular: Normal rate, regular rhythm and normal heart sounds.  No murmur heard. Pulmonary/Chest: Effort normal and breath sounds normal. No respiratory distress. She has no rales.  Lymphadenopathy:    She has cervical adenopathy.  Neurological: She is alert and oriented to person, place, and time.  Skin: Skin is warm. She is diaphoretic.  Psychiatric: She has a normal mood and affect.  Nursing note and vitals reviewed.  BP 130/88 (BP Location: Right Arm, Patient Position: Sitting, Cuff Size: Large)    Pulse 68   Temp 98.6 F (37 C) (Oral)   Resp 16   Ht 5\' 7"  (1.702 m)   Wt 186 lb 12.8 oz (84.7 kg)   LMP 02/27/2012   SpO2 99%   BMI 29.26 kg/m  Wt Readings from Last 3 Encounters:  09/20/16 186 lb 12.8 oz (84.7 kg)  06/27/16 187 lb (84.8 kg)  05/30/16 187 lb (84.8 kg)     Lab Results  Component Value Date   WBC 5.1 12/25/2014   HGB 11.7 12/25/2014   HCT 35.9 12/25/2014   PLT 175 12/25/2014   GLUCOSE 71 12/25/2014   CHOL 176 08/14/2014   TRIG 48.0 08/14/2014   HDL 55.60 08/14/2014   LDLCALC 111 (H) 08/14/2014   ALT 19 12/25/2014   AST 19 12/25/2014   NA 140 12/25/2014   K 4.4 12/25/2014   CL 104 08/14/2014   CREATININE 0.9 12/25/2014   BUN 22.5 12/25/2014   CO2 27 12/25/2014   TSH 3.50 08/14/2014   INR 1.04 02/10/2011    Mm Diag Breast Tomo Bilateral  Result Date: 11/12/2015 CLINICAL DATA:  Status post left lumpectomy, radiation therapy and chemotherapy for breast cancer in 2013. EXAM: DIGITAL DIAGNOSTIC BILATERAL MAMMOGRAM WITH 3D TOMOSYNTHESIS AND CAD COMPARISON:  Previous exam(s). ACR Breast Density Category b: There are scattered areas of fibroglandular density. FINDINGS: Stable post lumpectomy changes in the posterior aspect of the upper-outer left breast. Stable postradiation changes in the left breast. No interval findings suspicious for malignancy in either breast. Mammographic images were processed with CAD. IMPRESSION: No evidence of malignancy. RECOMMENDATION: Bilateral diagnostic mammogram in 1 year. I have discussed the findings and recommendations with the patient. Results were also provided in writing at the conclusion of the visit. If applicable, a reminder letter will be sent to the patient regarding the next appointment. BI-RADS CATEGORY  2: Benign. Electronically Signed   By: Claudie Revering M.D.   On: 11/12/2015 17:05     Assessment & Plan:  Plan  I am having [name] start on doxycycline and levocetirizine. I am also having her maintain her  cholecalciferol, vitamin B-12, diphenhydrAMINE, butalbital-acetaminophen-caffeine, and Lidocaine HCl.  Meds ordered this encounter  Medications  . doxycycline (VIBRA-TABS) 100 MG tablet    Sig: Take 1 tablet (100 mg total) by mouth 2 (two) times daily.    Dispense:  20 tablet    Refill:  0  . levocetirizine (XYZAL) 5 MG tablet    Sig: Take 1 tablet (5 mg total) by mouth every evening.    Dispense:  30 tablet    Refill:  5    Problem List Items Addressed This Visit      Unprioritized   Acute sinusitis - Primary   Relevant Medications   doxycycline (VIBRA-TABS) 100 MG tablet   levocetirizine (XYZAL) 5 MG tablet    Other Visit Diagnoses   None.     Follow-up: No Follow-up on file.  [name], DO

## 2016-09-21 NOTE — Assessment & Plan Note (Signed)
abx and xyzal Use saline spray rto prn

## 2016-09-27 ENCOUNTER — Telehealth: Payer: Self-pay | Admitting: Family Medicine

## 2016-09-27 NOTE — Telephone Encounter (Signed)
Patient called asking if she should finish the antibiotic that was prescribed at her last appt on 09/20/16. She was prescribed doxycycline (VIBRA-TABS) 100 MG tablet. She states it is giving her an upset stomach. Please advise.    Patient phone: [phone]

## 2016-09-28 NOTE — Telephone Encounter (Signed)
Spoke with patient, patient states that she tried to Rx on an empty stomach and a full stomach but it makes her nauseous. Patient report that she stopped taking the medication. Please advise.

## 2016-09-28 NOTE — Telephone Encounter (Signed)
If her sinus symptoms are resolved she can stop antibiotic. If still having sinus symptoms, let me know and I will call in another rx.

## 2016-10-03 NOTE — Telephone Encounter (Addendum)
Pt states she stopped medication and symptoms have resolved.  She has an appt with Dr. Etter Sjogren tomorrow (10/04/16) for a different issue.

## 2016-10-03 NOTE — Telephone Encounter (Addendum)
Called to follow up with patient. Left a message for call back.  MyChart message was also sent.

## 2016-10-04 ENCOUNTER — Ambulatory Visit (INDEPENDENT_AMBULATORY_CARE_PROVIDER_SITE_OTHER): Payer: BC Managed Care – PPO | Admitting: Family Medicine

## 2016-10-04 ENCOUNTER — Encounter: Payer: Self-pay | Admitting: Family Medicine

## 2016-10-04 VITALS — BP 118/80 | HR 75 | Temp 98.0°F | Resp 16 | Ht 67.0 in | Wt 186.2 lb

## 2016-10-04 DIAGNOSIS — N63 Unspecified lump in unspecified breast: Secondary | ICD-10-CM | POA: Diagnosis not present

## 2016-10-04 NOTE — Progress Notes (Signed)
Patient ID: Alisha Chen, female    DOB: 1966-09-14  Age: 50 y.o. MRN: [id number]    Subjective:  Subjective  HPI MAKIRAH CORAL presents for lump in middle of chest that she notice over weekend No cp, sob   Review of Systems  Constitutional: Negative for activity change, appetite change, fatigue and unexpected weight change.  Respiratory: Negative for cough and shortness of breath.   Cardiovascular: Negative for chest pain and palpitations.  Psychiatric/Behavioral: Negative for behavioral problems and dysphoric mood. The patient is not nervous/anxious.     History Past Medical History:  Diagnosis Date  . Back pain   . 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 History of chemotherapy 03/23/12- 05/10/12   s/p 4 cycles of FEC   . Hx: UTI (urinary tract infection)   . S/P radiation therapy 07/10/12-08/23/12   Left Breast: 50 Gy/25 Fractions; Boost: 10 Gy/5 Fractions  . Status post chemotherapy 1 dose 05/23/12   Stopped after one cycle of Taxol due to Grade 2 Toxicity  . Status post chemotherapy 1 dose 05/23/12   Stopped after one cycle of Taxol due to Grade 2 Toxicity    She has a past surgical history that includes Spine surgery (01/2011); Foot surgery; Back surgery; Portacath placement (02/10/2012); needle core Biospy (01/09/12); Breast lumpectomy (02/10/12); Regional Resection (03/06/12); Intrauterine device removal (01/24/12); Breast biopsy (01/09/2012); and Axillary Surgery (03/06/12).   Her family history includes Diabetes in her mother; Seizures in her son.She reports that she has never smoked. She has never used smokeless tobacco. She reports that she does not drink alcohol or use drugs.  Current Outpatient Prescriptions on File Prior to Visit  Medication Sig Dispense Refill  . butalbital-acetaminophen-caffeine (FIORICET, ESGIC) 50-325-40 MG tablet Take 1 tablet by mouth 2 (two) times daily as needed for headache (use sparingly to avoid  dependence/rebound headache - max 2 tabs per day, 1 - 2 days per week)). 14 tablet 0  . cholecalciferol (VITAMIN D) 1000 UNITS tablet Take 1,000 Units by mouth daily.    . diphenhydrAMINE (BENADRYL) 25 MG tablet Take 25 mg by mouth every 6 (six) hours as needed for itching. Reported on 04/30/2016    . levocetirizine (XYZAL) 5 MG tablet Take 1 tablet (5 mg total) by mouth every evening. 30 tablet 5  . Lidocaine HCl 2 % SOLN Use as directed 15 mLs in the mouth or throat every 3 (three) hours as needed (mouth/throat pain - gargle and spit). 100 mL 0  . vitamin B-12 (CYANOCOBALAMIN) 1000 MCG tablet Take 1,000 mcg by mouth daily.    Marland Kitchen doxycycline (VIBRA-TABS) 100 MG tablet Take 1 tablet (100 mg total) by mouth 2 (two) times daily. (Patient not taking: Reported on 10/04/2016) 20 tablet 0   No current facility-administered medications on file prior to visit.      Objective:  Objective  Physical Exam  Pulmonary/Chest:     BP 118/80 (BP Location: Right Arm, Patient Position: Sitting, Cuff Size: Normal)   Pulse 75   Temp 98 F (36.7 C) (Oral)   Resp 16   Ht 5\' 7"  (1.702 m)   Wt 186 lb 3.2 oz (84.5 kg)   LMP 02/27/2012   SpO2 98%   BMI 29.16 kg/m  Wt Readings from Last 3 Encounters:  10/04/16 186 lb 3.2 oz (84.5 kg)  09/20/16 186 lb 12.8 oz (84.7 kg)  06/27/16 187 lb (84.8 kg)     Lab Results  Component Value Date   WBC 5.1 12/25/2014   HGB 11.7 12/25/2014   HCT 35.9 12/25/2014   PLT 175 12/25/2014   GLUCOSE 71 12/25/2014   CHOL 176 08/14/2014   TRIG 48.0 08/14/2014   HDL 55.60 08/14/2014   LDLCALC 111 (H) 08/14/2014   ALT 19 12/25/2014   AST 19 12/25/2014   NA 140 12/25/2014   K 4.4 12/25/2014   CL 104 08/14/2014   CREATININE 0.9 12/25/2014   BUN 22.5 12/25/2014   CO2 27 12/25/2014   TSH 3.50 08/14/2014   INR 1.04 02/10/2011    Mm Diag Breast Tomo Bilateral  Result Date: 11/12/2015 CLINICAL DATA:  Status post left lumpectomy, radiation therapy and chemotherapy for  breast cancer in 2013. EXAM: DIGITAL DIAGNOSTIC BILATERAL MAMMOGRAM WITH 3D TOMOSYNTHESIS AND CAD COMPARISON:  Previous exam(s). ACR Breast Density Category b: There are scattered areas of fibroglandular density. FINDINGS: Stable post lumpectomy changes in the posterior aspect of the upper-outer left breast. Stable postradiation changes in the left breast. No interval findings suspicious for malignancy in either breast. Mammographic images were processed with CAD. IMPRESSION: No evidence of malignancy. RECOMMENDATION: Bilateral diagnostic mammogram in 1 year. I have discussed the findings and recommendations with the patient. Results were also provided in writing at the conclusion of the visit. If applicable, a reminder letter will be sent to the patient regarding the next appointment. BI-RADS CATEGORY  2: Benign. Electronically Signed   By: Claudie Revering M.D.   On: 11/12/2015 17:05     Assessment & Plan:  Plan  I am having Ms. Edinger maintain her cholecalciferol, vitamin B-12, diphenhydrAMINE, butalbital-acetaminophen-caffeine, Lidocaine HCl, doxycycline, and levocetirizine.  No orders of the defined types were placed in this encounter.   Problem List Items Addressed This Visit    None    Visit Diagnoses    Breast lump in female    -  Primary   Relevant Orders   US BREAST COMPLETE UNI LEFT INC AXILLA   MM DIAG BREAST TOMO UNI LEFT      Follow-up: Return if symptoms worsen or fail to improve.  [name], DO

## 2016-10-04 NOTE — Patient Instructions (Signed)
Breast Cancer, Female Breast cancer is an abnormal growth of tissue (tumor) in the breast that is cancerous (malignant). Unlike noncancerous (benign) tumors, malignant tumors can spread to other parts of your body. The most common type of female breast cancer begins in the milk ducts (ductal carcinoma). Breast cancer is one of the most common types of cancer in women. What are the causes? The exact cause of female breast cancer is unknown. What increases the risk?  Age older than 55 years.  Family history of breast cancer.  Having the BRCA1 and BRCA2 genes.  Personal history of radiation exposure.  Obesity.  Menstrual periods that begin before age 12 years.  Menopause that begins after age 55 years.  Pregnant for the first time at the age of 35 years or older.  Using hormone therapy.  Drinking more than one alcoholic drink per day. What are the signs or symptoms?  A painless lump in your breast.  Changes in the size or shape of your breast.  Breast skin changes, such as puckering or dimpling.  Nipple abnormalities, such as scaling, crustiness, redness, or pulling in (retraction).  Nipple discharge that is bloody or clear. How is this diagnosed? Your health care provider will ask about your medical history. He or she may also perform a number of procedures, such as:  A physical exam. This will involve feeling the tissue around the breast and under the arms.  Taking a sample of nipple discharge. The sample will be examined under a microscope.  Breast X-rays (mammogram), breast ultrasound exams, or an MRI.  Taking a tissue sample (biopsy) from the breast. The sample will be examined under a microscope to look for cancer cells.  Your cancer will be staged to determine its severity and extent. Staging is a careful attempt to find out the size of the tumor, whether the cancer has spread, and if so, to what parts of the body. You may need to have more tests to determine the  stage of your cancer:  Stage 0-The tumor has not spread to other breast tissue.  Stage I-The cancer is only found in the breast. The tumor may be up to  in (2 cm) wide.  Stage II-The cancer has spread to nearby lymph nodes. The tumor may be up to 2 in (5 cm) wide.  Stage III-The cancer has spread to more distant lymph nodes. The tumor may be larger than 2 in (5 cm) wide.  Stage IV-The cancer has spread to other parts of the body, such as the bones, brain, liver, or lungs.  How is this treated? Depending on the type and stage, female breast cancer may be treated with one or more of the following therapies:  Surgery to remove just the tumor (lumpectomy) or the entire breast (mastectomy). Lymph nodes may also be removed.  Radiation therapy, which uses high-energy rays to kill cancer cells.  Chemotherapy, which is the use of drugs to kill cancer cells.  Hormone therapy, which involves taking medicine to adjust the hormone levels in your body. You may take medicine to decrease your estrogen levels. This can help stop cancer cells from growing.  Follow these instructions at home:  Take medicines only as directed by your health care provider.  Maintain a healthy diet.  Consider joining a support group. This may help you learn to cope with the stress of having breast cancer.  Keep all follow-up appointments as directed by your health care provider. Contact a health care provider if:    You have a sudden increase in pain.  You notice a new lump in either breast or under your arm.  You develop swelling in either arm or hand.  You lose weight without trying.  You have a fever.  You notice new fatigue or weakness. Get help right away if:  You have chest pain or trouble breathing.  You faint. This information is not intended to replace advice given to you by your health care provider. Make sure you discuss any questions you have with your health care provider. Document Released:  02/15/2006 Document Revised: 03/17/2016 Document Reviewed: 01/01/2014 Elsevier Interactive Patient Education  2017 Elsevier Inc.  

## 2016-10-04 NOTE — Progress Notes (Signed)
Pre visit review using our clinic review tool, if applicable. No additional management support is needed unless otherwise documented below in the visit note. 

## 2016-10-06 ENCOUNTER — Ambulatory Visit: Payer: BC Managed Care – PPO | Admitting: Family Medicine

## 2016-10-07 ENCOUNTER — Ambulatory Visit
Admission: RE | Admit: 2016-10-07 | Discharge: 2016-10-07 | Disposition: A | Discharge status: Home / Self Care | Payer: BC Managed Care – PPO | Source: Ambulatory Visit | Attending: Family Medicine | Admitting: Family Medicine

## 2016-10-07 ENCOUNTER — Ambulatory Visit
Admission: RE | Admit: 2016-10-07 | Discharge: 2016-10-07 | Disposition: A | Discharge status: Home / Self Care | Payer: BC Managed Care – PPO | Source: Ambulatory Visit | Attending: Hematology and Oncology | Admitting: Hematology and Oncology

## 2016-10-07 ENCOUNTER — Telehealth: Payer: Self-pay

## 2016-10-07 ENCOUNTER — Other Ambulatory Visit: Payer: Self-pay | Admitting: Family Medicine

## 2016-10-07 DIAGNOSIS — N63 Unspecified lump in unspecified breast: Secondary | ICD-10-CM

## 2016-10-07 DIAGNOSIS — Z853 Personal history of malignant neoplasm of breast: Secondary | ICD-10-CM

## 2016-10-07 NOTE — Telephone Encounter (Signed)
Megan for the [facility] called to get a verbal order for pt to receive an bilateral breast exam for mammogram. Gave her the ok and she will send order via epic for PCP sign off. LB

## 2016-11-15 ENCOUNTER — Ambulatory Visit: Payer: BC Managed Care – PPO

## 2016-12-09 ENCOUNTER — Other Ambulatory Visit: Payer: Self-pay | Admitting: Nurse Practitioner

## 2017-01-12 ENCOUNTER — Encounter: Payer: Self-pay | Admitting: Family Medicine

## 2017-01-12 ENCOUNTER — Ambulatory Visit (INDEPENDENT_AMBULATORY_CARE_PROVIDER_SITE_OTHER): Payer: BC Managed Care – PPO | Admitting: Family Medicine

## 2017-01-12 VITALS — BP 126/83 | HR 80 | Temp 99.0°F | Ht 67.0 in | Wt 187.6 lb

## 2017-01-12 DIAGNOSIS — B9689 Other specified bacterial agents as the cause of diseases classified elsewhere: Secondary | ICD-10-CM

## 2017-01-12 DIAGNOSIS — J208 Acute bronchitis due to other specified organisms: Secondary | ICD-10-CM

## 2017-01-12 MED ORDER — AZITHROMYCIN 250 MG PO TABS: ORAL_TABLET | ORAL | 0 refills | Status: DC

## 2017-01-12 MED ORDER — DOXYCYCLINE HYCLATE 100 MG PO TABS: 100.0000 mg | ORAL_TABLET | Freq: Two times a day (BID) | ORAL | 0 refills | Status: DC

## 2017-01-12 NOTE — Progress Notes (Signed)
Pre visit review using our clinic review tool, if applicable. No additional management support is needed unless otherwise documented below in the visit note. 

## 2017-01-12 NOTE — Patient Instructions (Signed)
Flonase (fluticasone); nasal spray that is over the counter. 2 sprays each nostril, once daily. Aim towards the same side eye when you spray.  Continue to push fluids, practice good hand hygiene, and cover your mouth if you cough.  If you start having persistently high fevers, shaking or shortness of breath, seek immediate care.

## 2017-01-12 NOTE — Progress Notes (Signed)
Chief Complaint  Patient presents with  . Fever    Pt reports fever x2 days with dry cough chills and congestion No body aches     SHAMIRACLE WRATTEN here for URI complaints.  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 Back pain   . 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 History of chemotherapy 03/23/12- 05/10/12   s/p 4 cycles of FEC   . Hx: UTI (urinary tract infection)   . S/P radiation therapy 07/10/12-08/23/12   Left Breast: 50 Gy/25 Fractions; Boost: 10 Gy/5 Fractions  . Status post chemotherapy 1 dose 05/23/12   Stopped after one cycle of Taxol due to Grade 2 Toxicity  . Status post chemotherapy 1 dose 05/23/12   Stopped after one cycle of Taxol due to Grade 2 Toxicity   Family History  Problem Relation Age of Onset  . Diabetes Mother   . Seizures Son     BP 126/83 (BP Location: Right Arm, Patient Position: Sitting, Cuff Size: Small)   Pulse 80   Temp 99 F (37.2 C) (Oral)   Ht 5\' 7"  (1.702 m)   Wt 187 lb 9.6 oz (85.1 kg)   LMP 02/27/2012   SpO2 97%   BMI 29.38 kg/m  General: Awake, alert, appears stated age 51: AT, [place], ears patent b/l and TM's neg, nares patent w rhinorrhea appreciate on L, turbinates are enlarged on L, no sinus tenderness, pharynx pink and without exudates, PND appreciated, MMM Neck: No masses or asymmetry Heart: RRR, no murmurs, no bruits Lungs: CTAB, no accessory muscle use Psych: Age appropriate judgment and insight, normal mood and affect  Acute bacterial bronchitis - Plan: azithromycin (ZITHROMAX) 250 MG tablet  Orders as  above. Reviewed side effects of Clarithromycin. Continue to push fluids, practice good hand hygiene, cover mouth when coughing. F/u prn. If starting to experience fevers, shaking, or shortness of breath, seek immediate care. Pt voiced understanding and agreement to the plan.  [place], DO 01/12/17 1:36 PM

## 2017-03-31 ENCOUNTER — Other Ambulatory Visit: Payer: Self-pay | Admitting: General Surgery

## 2017-03-31 DIAGNOSIS — C50912 Malignant neoplasm of unspecified site of left female breast: Secondary | ICD-10-CM

## 2017-03-31 DIAGNOSIS — R591 Generalized enlarged lymph nodes: Secondary | ICD-10-CM

## 2017-04-05 ENCOUNTER — Ambulatory Visit
Admission: RE | Admit: 2017-04-05 | Discharge: 2017-04-05 | Disposition: A | Discharge status: Home / Self Care | Payer: BC Managed Care – PPO | Source: Ambulatory Visit | Attending: General Surgery | Admitting: General Surgery

## 2017-04-05 DIAGNOSIS — C50912 Malignant neoplasm of unspecified site of left female breast: Secondary | ICD-10-CM

## 2017-04-05 DIAGNOSIS — R591 Generalized enlarged lymph nodes: Secondary | ICD-10-CM

## 2017-05-15 ENCOUNTER — Ambulatory Visit: Payer: BC Managed Care – PPO | Admitting: Hematology and Oncology

## 2017-07-10 ENCOUNTER — Ambulatory Visit: Payer: BC Managed Care – PPO | Admitting: Obstetrics and Gynecology

## 2017-08-07 ENCOUNTER — Ambulatory Visit (INDEPENDENT_AMBULATORY_CARE_PROVIDER_SITE_OTHER): Payer: BC Managed Care – PPO | Admitting: Obstetrics and Gynecology

## 2017-08-07 ENCOUNTER — Encounter: Payer: Self-pay | Admitting: Obstetrics and Gynecology

## 2017-08-07 VITALS — BP 116/73 | HR 75 | Wt 192.0 lb

## 2017-08-07 DIAGNOSIS — Z1151 Encounter for screening for human papillomavirus (HPV): Secondary | ICD-10-CM | POA: Diagnosis not present

## 2017-08-07 DIAGNOSIS — Z01419 Encounter for gynecological examination (general) (routine) without abnormal findings: Secondary | ICD-10-CM

## 2017-08-07 DIAGNOSIS — Z124 Encounter for screening for malignant neoplasm of cervix: Secondary | ICD-10-CM

## 2017-08-07 NOTE — Progress Notes (Signed)
Pt presents for annual and pap smear. Declines STD screening. MGM and colonoscopy updated and WNL per pt.

## 2017-08-07 NOTE — Progress Notes (Signed)
Subjective:     Alisha Chen is a 51 y.o. female G1P1 with BMI 30 who is here for a comprehensive physical exam. The patient reports no problems. Patient has been menopausal since 2013. She denies any abnormal discharge or abnormal vaginal bleeding. She denies any pelvic pain. She denies any urinary incontinence. She is sexually active using KY jelly to assist with lubrication with good results.  Past Medical History:  Diagnosis Date  . Back pain   . 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 History of chemotherapy 03/23/12- 05/10/12   s/p 4 cycles of FEC   . Hx: UTI (urinary tract infection)   . S/P radiation therapy 07/10/12-08/23/12   Left Breast: 50 Gy/25 Fractions; Boost: 10 Gy/5 Fractions  . Status post chemotherapy 1 dose 05/23/12   Stopped after one cycle of Taxol due to Grade 2 Toxicity  . Status post chemotherapy 1 dose 05/23/12   Stopped after one cycle of Taxol due to Grade 2 Toxicity   Past Surgical History:  Procedure Laterality Date  . AXILLARY SURGERY  03/06/12   left, regional resection, 1/3 nodes pos  . BACK SURGERY     LUMBAR DECOMPRESSION  . BREAST BIOPSY  01/09/2012   left breast 3 0'clock/ER?PR =positive,her 2 neg  . BREAST LUMPECTOMY  02/10/12   Left Breast: 2 Foci of Invasive Ductal Caand High grade Ductal Carcinoma In Situ, 0/14 nodes Left Axilla Negative : Regional Resection of Lymph Nodes: 0/5 Nodes Negative  . FOOT SURGERY     multiple  . INTRAUTERINE DEVICE REMOVAL  01/24/12  . needle core Biospy  01/09/12   Left Breast: Invasive Ductal Carcinoma, Lymph Node Axilla: Metastatic Mammary Carcinoma  . PORTACATH PLACEMENT  02/10/2012   Procedure: INSERTION PORT-A-CATH;  Surgeon: Rolm Bookbinder, MD;  Location: [facility];  Service: General;  Laterality: Right;  . Regional Resection  03/06/12   Left Axilla: 1/3 Nodes Metastatic Carcinoma  . SPINE SURGERY  01/2011   Family History  Problem Relation Age of Onset   . Diabetes Mother   . Seizures Son     Social History   Social History  . Marital status: Married    Spouse name: N/A  . Number of children: 1  . Years of education: N/A   Occupational History  . 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  [place] History Main Topics  . Smoking status: Never Smoker  . Smokeless tobacco: Never Used  . Alcohol use No  . Drug use: No  . Sexual activity: Yes    Partners: Male     Comment: menarche age 32,premenopausa; G!P1,1st pregnancy age 17   Other Topics Concern  . Not on file   Social History Narrative   Married x 21 years. Production assistant, radio at [employer]. 12 ten year old son   Health Maintenance  Topic Date Due  . INFLUENZA VACCINE  06/21/2017  . HIV Screening  09/20/2017 (Originally [birth year]/1980)  . MAMMOGRAM  10/07/2017  . PAP SMEAR  06/28/2019  . TETANUS/TDAP  09/19/2021  . COLONOSCOPY  09/09/2024    Review of Systems Pertinent items are noted in HPI.   Objective:  Blood pressure 116/73, pulse 75, weight 192 lb (87.1 kg), last menstrual period 02/27/2012.     GENERAL: Well-developed, well-nourished female in no acute distress.  HEENT: Normocephalic, atraumatic. Sclerae anicteric.  NECK: Supple. Normal thyroid.  LUNGS: Clear  to auscultation bilaterally.  HEART: Regular rate and rhythm. BREASTS: Symmetric in size. No palpable masses or lymphadenopathy, skin changes, or nipple drainage. Palpable scar tissue on left breast ABDOMEN: Soft, nontender, nondistended. No organomegaly. PELVIC: Normal external female genitalia. Vagina is pink and rugated.  Normal discharge. Normal appearing cervix. Uterus is normal in size. No adnexal mass or tenderness. EXTREMITIES: No cyanosis, clubbing, or edema, 2+ distal pulses.    Assessment:    Healthy female exam.      Plan:    pap smear collected today Patient with normal mammogram in May 2018 Patient advised to perform self breast and vulva exam  monthly Patient will be contacted with abnormal results RTC in 1 year or prn See After Visit Summary for Counseling Recommendations

## 2017-08-09 LAB — CYTOLOGY - PAP
Diagnosis: NEGATIVE
HPV (WINDOPATH): NOT DETECTED

## 2017-09-12 ENCOUNTER — Other Ambulatory Visit: Payer: Self-pay | Admitting: Hematology and Oncology

## 2017-09-12 ENCOUNTER — Other Ambulatory Visit: Payer: Self-pay

## 2017-09-12 DIAGNOSIS — Z853 Personal history of malignant neoplasm of breast: Secondary | ICD-10-CM

## 2017-09-14 ENCOUNTER — Ambulatory Visit: Payer: BC Managed Care – PPO | Admitting: Hematology and Oncology

## 2017-09-25 ENCOUNTER — Telehealth: Payer: Self-pay | Admitting: Hematology and Oncology

## 2017-09-25 ENCOUNTER — Encounter: Payer: Self-pay | Admitting: Hematology and Oncology

## 2017-09-25 ENCOUNTER — Ambulatory Visit (HOSPITAL_BASED_OUTPATIENT_CLINIC_OR_DEPARTMENT_OTHER): Payer: BC Managed Care – PPO | Admitting: Hematology and Oncology

## 2017-09-25 DIAGNOSIS — Z853 Personal history of malignant neoplasm of breast: Secondary | ICD-10-CM | POA: Diagnosis not present

## 2017-09-25 DIAGNOSIS — C50412 Malignant neoplasm of upper-outer quadrant of left female breast: Secondary | ICD-10-CM

## 2017-09-25 NOTE — Assessment & Plan Note (Signed)
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 The primary tumor revealed 2 foci measuring 1.3 and 0.8 cm.with 1/22 LN positive for metastatic disease. She has completed 4 cycles of FEC 100.followed by Taxol. Could not tolerate tamoxifen for more than 3 weeks. We gave her a prescription for anastrozole but she does not want to take it  Breast Cancer Surveillance: 1. Breast exam 09/25/2017 normal 2. Mammogram scheduled for 10/11/2017  Weight issues: Patient is exercising for an hour every day. In spite of this she is not losing weight. I discussed with her the concept of intermittent fasting and she is interested in it and will consider it.  Return to clinic in 1 year for follow-up with survivorship

## 2017-09-25 NOTE — Telephone Encounter (Signed)
Gave patient avs and calendar with appts per 1/15 los.  °

## 2017-09-25 NOTE — Progress Notes (Signed)
Patient Care Team: Carollee Herter, Alferd Apa, DO as PCP - General (Family Medicine)  DIAGNOSIS:  Encounter Diagnosis  Name Primary?  . Primary cancer of upper outer quadrant of left female breast ([place])     SUMMARY OF ONCOLOGIC HISTORY:   Primary cancer of upper outer quadrant of left female breast ([place])   03/12/2012 Surgery    left breast lumpectomy that revealed 2 foci of invasive ductal carcinoma measuring 1.3 cm (ER87%,PR 100% Ki 40%)and 0.8 cm (Er96%, PR 99% Ki 74%)grade 2 with adjacent high-grade ductal carcinoma in situ with no evidence of angiolymphatic invasion.1/22 LN      03/23/2012 - 05/10/2012 Chemotherapy    Adjuvant chemo FEC 100 foll by Taxol X 12      07/10/2012 - 08/23/2012 Radiation Therapy    Adj XRT      09/14/2012 - 10/05/2012 Anti-estrogen oral therapy    Tamoxifen 20 mg 3 weeks stopped because of intolerance       CHIEF COMPLIANT: Surveillance of breast cancer  INTERVAL HISTORY: Alisha Chen is a 51 year old with above-mentioned history of left breast cancer currently on surveillance but she is previously been treated with lumpectomy followed by adjuvant chemotherapy and radiation.  She took tamoxifen briefly but was stopped because of intolerance.  She is completed 5 and half years since her diagnosis.  She reports to be feeling well denies any lumps or nodules in the breast.  REVIEW OF SYSTEMS:   Constitutional: Denies fevers, chills or abnormal weight loss Eyes: Denies blurriness of vision Ears, nose, mouth, throat, and face: Denies mucositis or sore throat Respiratory: Denies cough, dyspnea or wheezes Cardiovascular: Denies palpitation, chest discomfort Gastrointestinal:  Denies nausea, heartburn or change in bowel habits Skin: Denies abnormal skin rashes Lymphatics: Denies new lymphadenopathy or easy bruising Neurological:Denies numbness, tingling or new weaknesses Behavioral/Psych: Mood is stable, no new changes  Extremities: No lower extremity  edema Breast:  denies any pain or lumps or nodules in either breasts All other systems were reviewed with the patient and are negative.  I have reviewed the past medical history, past surgical history, social history and family history with the patient and they are unchanged from previous note.  ALLERGIES:  is allergic to penicillins; shellfish-derived products; clarithromycin; and ondansetron.  MEDICATIONS:  Current Outpatient Medications  Medication Sig Dispense Refill  . cholecalciferol (VITAMIN D) 1000 UNITS tablet Take 1,000 Units by mouth daily.    . vitamin B-12 (CYANOCOBALAMIN) 1000 MCG tablet Take 1,000 mcg by mouth daily.     No current facility-administered medications for this visit.     PHYSICAL EXAMINATION: ECOG PERFORMANCE STATUS: 0 - Asymptomatic  Vitals:   09/25/17 1116  BP: 121/78  Pulse: 76  Resp: 20  Temp: 97.8 F (36.6 C)  SpO2: 100%   Filed Weights   09/25/17 1116  Weight: 193 lb 11.2 oz (87.9 kg)    GENERAL:alert, no distress and comfortable SKIN: skin color, texture, turgor are normal, no rashes or significant lesions EYES: normal, Conjunctiva are pink and non-injected, sclera clear OROPHARYNX:no exudate, no erythema and lips, buccal mucosa, and tongue normal  NECK: supple, thyroid normal size, non-tender, without nodularity LYMPH:  no palpable lymphadenopathy in the cervical, axillary or inguinal LUNGS: clear to auscultation and percussion with normal breathing effort HEART: regular rate & rhythm and no murmurs and no lower extremity edema ABDOMEN:abdomen soft, non-tender and normal bowel sounds MUSCULOSKELETAL:no cyanosis of digits and no clubbing  NEURO: alert & oriented x 3 with fluent speech,  no focal motor/sensory deficits EXTREMITIES: No lower extremity edema BREAST: No palpable masses or nodules in either right or left breasts. No palpable axillary supraclavicular or infraclavicular adenopathy no breast tenderness or nipple discharge.  (exam performed in the presence of a chaperone)  LABORATORY DATA:  I have reviewed the data as listed   Chemistry      Component Value Date/Time   NA 140 12/25/2014 1349   K 4.4 12/25/2014 1349   CL 104 08/14/2014 0933   CL 105 12/21/2012 1527   CO2 27 12/25/2014 1349   BUN 22.5 12/25/2014 1349   CREATININE 0.9 12/25/2014 1349      Component Value Date/Time   CALCIUM 10.2 12/25/2014 1349   ALKPHOS 116 12/25/2014 1349   AST 19 12/25/2014 1349   ALT 19 12/25/2014 1349   BILITOT 0.28 12/25/2014 1349       Lab Results  Component Value Date   WBC 5.1 12/25/2014   HGB 11.7 12/25/2014   HCT 35.9 12/25/2014   MCV 89.1 12/25/2014   PLT 175 12/25/2014   NEUTROABS 3.0 12/25/2014    ASSESSMENT & PLAN:  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 The primary tumor revealed 2 foci measuring 1.3 and 0.8 cm.with 1/22 LN positive for metastatic disease. She has completed 4 cycles of FEC 100.followed by Taxol. Could not tolerate tamoxifen for more than 3 weeks. We gave her a prescription for anastrozole but she does not want to take it  Breast Cancer Surveillance: 1. Breast exam 09/25/2017 normal 2. Mammogram scheduled for 10/11/2017  Weight issues: Patient is exercising for an hour every day. In spite of this she is not losing weight. I discussed with her the concept of intermittent fasting and she is interested in it and will consider it.  Return to clinic in 1 year for follow-up with survivorship   I spent 25 minutes talking to the patient of which more than half was spent in counseling and coordination of care.  No orders of the defined types were placed in this encounter.  The patient has a good understanding of the overall plan. she agrees with it. she will call with any problems that may develop before the next visit here.   Rulon Eisenmenger,  MD 09/25/17

## 2017-10-16 ENCOUNTER — Ambulatory Visit
Admission: RE | Admit: 2017-10-16 | Discharge: 2017-10-16 | Disposition: A | Discharge status: Home / Self Care | Payer: BC Managed Care – PPO | Source: Ambulatory Visit | Attending: Hematology and Oncology | Admitting: Hematology and Oncology

## 2017-10-16 DIAGNOSIS — Z853 Personal history of malignant neoplasm of breast: Secondary | ICD-10-CM

## 2018-04-09 ENCOUNTER — Encounter: Payer: Self-pay | Admitting: Medical

## 2018-04-09 ENCOUNTER — Ambulatory Visit (INDEPENDENT_AMBULATORY_CARE_PROVIDER_SITE_OTHER): Payer: BC Managed Care – PPO | Admitting: Medical

## 2018-04-09 VITALS — BP 125/79 | HR 72 | Temp 98.0°F | Resp 16 | Ht 67.0 in | Wt 195.8 lb

## 2018-04-09 DIAGNOSIS — M545 Low back pain: Secondary | ICD-10-CM | POA: Diagnosis not present

## 2018-04-09 LAB — POC URINALSYSI DIPSTICK (AUTOMATED)
Bilirubin, UA: NEGATIVE
GLUCOSE UA: NEGATIVE
Ketones, UA: NEGATIVE
Leukocytes, UA: NEGATIVE
NITRITE UA: NEGATIVE
Protein, UA: NEGATIVE
RBC UA: NEGATIVE
Spec Grav, UA: 1.015 (ref 1.010–1.025)
UROBILINOGEN UA: NEGATIVE U/dL — AB
pH, UA: 7.5 (ref 5.0–8.0)

## 2018-04-09 MED ORDER — CYCLOBENZAPRINE HCL 5 MG PO TABS: 5.0000 mg | ORAL_TABLET | Freq: Every day | ORAL | 0 refills | Status: DC

## 2018-04-09 MED ORDER — DICLOFENAC SODIUM 75 MG PO TBEC: 75.0000 mg | DELAYED_RELEASE_TABLET | Freq: Two times a day (BID) | ORAL | 0 refills | Status: DC

## 2018-04-09 NOTE — Progress Notes (Signed)
Subjective:    Patient ID: Alisha Chen, female    DOB: [birth year], 52 y.o.   MRN: [id number]  HPI  Pt in with some recent mid lower back pain for past 3-4 weeks. States pain was lower mid spine first. But over past week moved to her rt side. If she leans back or lays on left side pain in less. No pain shooting to her legs. At times pain can be 7/8 at times. She but other times pain is much less. When uses alleve pain is 1/10.  Hx of remote surgery in 2012.    Review of Systems  Constitutional:       Subjective fever last week.  Respiratory: Negative for cough, choking, chest tightness and wheezing.   Cardiovascular: Negative for chest pain and palpitations.  Gastrointestinal: Negative for abdominal pain.  Genitourinary: Negative for decreased urine volume, difficulty urinating, dysuria, flank pain, hematuria and menstrual problem.  Musculoskeletal: Positive for back pain.  Skin: Negative for rash.  Hematological: Negative for adenopathy. Does not bruise/bleed easily.  Psychiatric/Behavioral: Negative for behavioral problems.    Past Medical History:  Diagnosis Date  . Back pain   . 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 History of chemotherapy 03/23/12- 05/10/12   s/p 4 cycles of FEC   . Hx: UTI (urinary tract infection)   . S/P radiation therapy 07/10/12-08/23/12   Left Breast: 50 Gy/25 Fractions; Boost: 10 Gy/5 Fractions  . Status post chemotherapy 1 dose 05/23/12   Stopped after one cycle of Taxol due to Grade 2 Toxicity  . Status post chemotherapy 1 dose 05/23/12   Stopped after one cycle of Taxol due to Grade 2 Toxicity     Social History   Socioeconomic History  . Marital status: Married    Spouse name: Not on file  . Number of children: 1  . Years of education: Not on file  . Highest education level: Not on file  Occupational History  . 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 Financial resource strain: Not on file  . Food insecurity:    Worry: Not on file    Inability: Not on file  . Transportation needs:    Medical: Not on file    Non-medical: Not on file  Tobacco Use  . Smoking status: Never Smoker  . Smokeless tobacco: Never Used  Substance and Sexual Activity  . Alcohol use: No  . Drug use: No  . Sexual activity: Yes    Partners: Male    Comment: menarche age 49,premenopausa; G!P1,1st pregnancy age 74  Lifestyle  . Physical activity:    Days per week: Not on file    Minutes per session: Not on file  . Stress: Not on file  Relationships  . Social connections:    Talks on phone: Not on file    Gets together: Not on file    Attends religious service: Not on file    Active member of club or organization: Not on file    Attends meetings of clubs or organizations: Not on file    Relationship status: Not on file  . Intimate partner violence:    Fear of current or ex partner: Not on file    Emotionally abused: Not on file    Physically abused: Not on file    Forced sexual activity: Not  on file  Other Topics Concern  . Not on file  Social History Narrative   Married x 21 years. Production assistant, radio at [employer]. 54 ten year old son    Past Surgical History:  Procedure Laterality Date  . AXILLARY SURGERY  03/06/12   left, regional resection, 1/3 nodes pos  . BACK SURGERY     LUMBAR DECOMPRESSION  . BREAST BIOPSY  01/09/2012   left breast 3 0'clock/ER?PR =positive,her 2 neg  . BREAST LUMPECTOMY Left 02/10/12   Left Breast: 2 Foci of Invasive Ductal Caand High grade Ductal Carcinoma In Situ, 0/14 nodes Left Axilla Negative : Regional Resection of Lymph Nodes: 0/5 Nodes Negative  . FOOT SURGERY     multiple  . INTRAUTERINE DEVICE REMOVAL  01/24/12  . needle core Biospy  01/09/12   Left Breast: Invasive Ductal Carcinoma, Lymph Node Axilla: Metastatic Mammary Carcinoma  . PORTACATH PLACEMENT   02/10/2012   Procedure: INSERTION PORT-A-CATH;  Surgeon: Rolm Bookbinder, MD;  Location: [facility];  Service: General;  Laterality: Right;  . Regional Resection  03/06/12   Left Axilla: 1/3 Nodes Metastatic Carcinoma  . SPINE SURGERY  01/2011    Family History  Problem Relation Age of Onset  . Diabetes Mother   . Seizures Son     Allergies  Allergen Reactions  . Penicillins Other (See Comments)    Not sure, but was told by mother never to take this medication as a child  . Shellfish-Derived Products Other (See Comments)    Welts  . Clarithromycin Other (See Comments)    REACTION: NAUSEA,WEAK,BITTER TASTE  . Ondansetron Nausea And Vomiting    Headaches    Current Outpatient Medications on File Prior to Visit  Medication Sig Dispense Refill  . cholecalciferol (VITAMIN D) 1000 UNITS tablet Take 1,000 Units by mouth daily.    . vitamin B-12 (CYANOCOBALAMIN) 1000 MCG tablet Take 1,000 mcg by mouth daily.     No current facility-administered medications on file prior to visit.     BP 125/79   Pulse 72   Temp 98 F (36.7 C) (Oral)   Resp 16   Ht 5\' 7"  (1.702 m)   Wt 195 lb 12.8 oz (88.8 kg)   LMP 02/27/2012   SpO2 100%   BMI 30.67 kg/m       Objective:   Physical Exam  General Appearance- Not in acute distress.    Chest and Lung Exam Auscultation: Breath sounds:-Normal. Clear even and unlabored. Adventitious sounds:- No Adventitious sounds.  Cardiovascular Auscultation:Rythm - Regular, rate and rythm. Heart Sounds -Normal heart sounds.  Abdomen Inspection:-Inspection Normal.  Palpation/Perucssion: Palpation and Percussion of the abdomen reveal- Non Tender, No Rebound tenderness, No rigidity(Guarding) and No Palpable abdominal masses.  Liver:-Normal.  Spleen:- Normal.   Back- Mid lumbar spine tenderness to palpation. Some pain rt paralumbar area. Pain on straight leg lift. Pain on lateral movements and flexion/extension of the  spine.  Lower ext neurologic  L5-S1 sensation intact bilaterally. Normal patellar reflexes bilaterally. No foot drop bilaterally.  No cva tenderness.  Skin- no rash to her back or abdomen.     Assessment & Plan:   Your back pain appears to be mostly muscular type pain.  Your urine does not look infected presently and there is no blood presently.  You mentioned some low-grade fever last week so we did go ahead and send out urine culture.   Your presentation in my opinion is not consistent with kidney stone.  You might benefit from a lumbar x-ray today and that might show up incidental calcium based stone.  If more pain were to occur as I described then CT abdomen/pelvis would be the preferred choice to work-up possible kidney stone.  You had mentioned ultrasound but I do not think that is clinically indicated presently.  Please send me my chart message and let me know if the orthopedist I did x-rays.  If not then I would order lumbar views.  Also if your pain features change I would like to know as that would potentially change plans.  I am providing you with a pre-prescription of both diclofenac and Flexeril.  You have appointment later with the orthopedist.  I want you to see if he agrees with these prescriptions or if he would provide you different treatment regimen.  Follow-up to be determined based on what orthopedist finds.  Mackie Pai, PA-C

## 2018-04-09 NOTE — Patient Instructions (Addendum)
  Your back pain appears to be mostly muscular type pain.  Your urine does not look infected presently and there is no blood presently.  You mentioned some low-grade fever last week so we did go ahead and send out urine culture.   Your presentation in my opinion is not consistent with kidney stone.  You might benefit from a lumbar x-ray today and that might show up incidental calcium based stone.  If more pain were to occur as I described then CT abdomen/pelvis would be the preferred choice to work-up possible kidney stone.  You had mentioned ultrasound but I do not think that is clinically indicated presently.  Please send me my chart message and let me know if the orthopedist I did x-rays.  If not then I would order lumbar views. Also if your pain features change I would like to know as that would potentially change plans.  I am providing you with a pre-prescription of both diclofenac and Flexeril.  You have appointment later with the orthopedist.  I want you to see if he agrees with these prescriptions or if he would provide you different treatment regimen.  Follow-up to be determined based on what orthopedist finds.

## 2018-04-24 ENCOUNTER — Ambulatory Visit (HOSPITAL_COMMUNITY)
Admission: EM | Admit: 2018-04-24 | Discharge: 2018-04-24 | Disposition: A | Discharge status: Home / Self Care | Payer: BC Managed Care – PPO | Attending: Emergency Medicine | Admitting: Emergency Medicine

## 2018-04-24 ENCOUNTER — Other Ambulatory Visit: Payer: Self-pay

## 2018-04-24 ENCOUNTER — Encounter (HOSPITAL_COMMUNITY): Payer: Self-pay | Admitting: Emergency Medicine

## 2018-04-24 ENCOUNTER — Ambulatory Visit (INDEPENDENT_AMBULATORY_CARE_PROVIDER_SITE_OTHER): Payer: BC Managed Care – PPO

## 2018-04-24 DIAGNOSIS — W19XXXA Unspecified fall, initial encounter: Secondary | ICD-10-CM

## 2018-04-24 DIAGNOSIS — M25561 Pain in right knee: Secondary | ICD-10-CM

## 2018-04-24 DIAGNOSIS — S8991XA Unspecified injury of right lower leg, initial encounter: Secondary | ICD-10-CM

## 2018-04-24 MED ORDER — IBUPROFEN 800 MG PO TABS: 800.0000 mg | ORAL_TABLET | Freq: Three times a day (TID) | ORAL | 0 refills | Status: DC

## 2018-04-24 NOTE — ED Provider Notes (Signed)
[facility]    CSN: [id number] Arrival date & time: 04/24/18  1930     History   Chief Complaint Chief Complaint  Patient presents with  . Fall    HPI Alisha Chen is a 52 y.o. female presenting today for evaluation of right knee pain.  Patient fell earlier today and landed on her right knee.  She was trying to go over a half wall and ended up catching her foot and falling.  Since she has had pain since.  Endorsing significant pain with full extension and flexion.  Most comfortable position is at 90 degrees.  She has been weightbearing, but slowly and with a limp.  Applying ice.  HPI  Past Medical History:  Diagnosis Date  . Back pain   . 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 History of chemotherapy 03/23/12- 05/10/12   s/p 4 cycles of FEC   . Hx: UTI (urinary tract infection)   . S/P radiation therapy 07/10/12-08/23/12   Left Breast: 50 Gy/25 Fractions; Boost: 10 Gy/5 Fractions  . Status post chemotherapy 1 dose 05/23/12   Stopped after one cycle of Taxol due to Grade 2 Toxicity  . Status post chemotherapy 1 dose 05/23/12   Stopped after one cycle of Taxol due to Grade 2 Toxicity    Patient Active Problem List   Diagnosis Date Noted  . UTI (urinary tract infection) 04/24/2015  . Migraine 04/24/2015  . Tendinitis of wrist 09/10/2014  . Left wrist pain 08/21/2014  . Primary cancer of upper outer quadrant of left female breast ([place]) 01/12/2012  . CONSTIPATION 09/24/2010  . RECTAL PAIN 09/24/2010  . CHANGE IN BOWELS 09/24/2010  . HIP PAIN, LEFT, CHRONIC 08/23/2010  . WEIGHT LOSS 08/23/2010  . Acute sinusitis 05/26/2008  . EAR PAIN, LEFT 02/13/2008  . URI 02/13/2008    Past Surgical History:  Procedure Laterality Date  . AXILLARY SURGERY  03/06/12   left, regional resection, 1/3 nodes pos  . BACK SURGERY     LUMBAR DECOMPRESSION  . BREAST BIOPSY  01/09/2012   left breast 3 0'clock/ER?PR =positive,her 2 neg  .  BREAST LUMPECTOMY Left 02/10/12   Left Breast: 2 Foci of Invasive Ductal Caand High grade Ductal Carcinoma In Situ, 0/14 nodes Left Axilla Negative : Regional Resection of Lymph Nodes: 0/5 Nodes Negative  . FOOT SURGERY     multiple  . INTRAUTERINE DEVICE REMOVAL  01/24/12  . needle core Biospy  01/09/12   Left Breast: Invasive Ductal Carcinoma, Lymph Node Axilla: Metastatic Mammary Carcinoma  . PORTACATH PLACEMENT  02/10/2012   Procedure: INSERTION PORT-A-CATH;  Surgeon: Rolm Bookbinder, MD;  Location: [facility];  Service: General;  Laterality: Right;  . Regional Resection  03/06/12   Left Axilla: 1/3 Nodes Metastatic Carcinoma  . SPINE SURGERY  01/2011    OB History    Gravida  1   Para  1   Term      Preterm      AB      Living        SAB      TAB      Ectopic      Multiple      Live Births           Obstetric Comments  Menarche age 18, Parity age 78, G34, 50!, Premenopausal         Home Medications    Prior to Admission  medications   Medication Sig Start Date End Date Taking? Authorizing Provider  cholecalciferol (VITAMIN D) 1000 UNITS tablet Take 1,000 Units by mouth daily.   Yes [provider]  vitamin B-12 (CYANOCOBALAMIN) 1000 MCG tablet Take 1,000 mcg by mouth daily.   Yes [provider]  ibuprofen (ADVIL,MOTRIN) 800 MG tablet Take 1 tablet (800 mg total) by mouth 3 (three) times daily. 04/24/18   Wieters, Elesa Hacker, PA-C    Family History Family History  Problem Relation Age of Onset  . Diabetes Mother   . Seizures Son     Social History Social History   Tobacco Use  . Smoking status: Never Smoker  . Smokeless tobacco: Never Used  Substance Use Topics  . Alcohol use: No  . Drug use: No     Allergies   Penicillins; Shellfish-derived products; Clarithromycin; and Ondansetron   Review of Systems Review of Systems  Constitutional: Negative for activity change and appetite change.  Eyes: Negative for  visual disturbance.  Respiratory: Negative for shortness of breath.   Cardiovascular: Negative for chest pain.  Gastrointestinal: Negative for abdominal pain, nausea and vomiting.  Musculoskeletal: Positive for arthralgias, gait problem, joint swelling and myalgias. Negative for back pain, neck pain and neck stiffness.  Skin: Negative for color change and wound.  Neurological: Negative for dizziness, seizures, syncope, weakness, light-headedness, numbness and headaches.     Physical Exam Triage Vital Signs ED Triage Vitals [04/24/18 1941]  Enc Vitals Group     BP 125/85     Pulse Rate 76     Resp 18     Temp 98 F (36.7 C)     Temp Source Oral     SpO2 96 %     Weight      Height      Head Circumference      Peak Flow      Pain Score 5     Pain Loc      Pain Edu?      Excl. in [place]?    No data found.  Updated Vital Signs BP 125/85 (BP Location: Right Arm)   Pulse 76   Temp 98 F (36.7 C) (Oral)   Resp 18   LMP 02/27/2012   SpO2 96%   Visual Acuity Right Eye Distance:   Left Eye Distance:   Bilateral Distance:    Right Eye Near:   Left Eye Near:    Bilateral Near:     Physical Exam  Constitutional: She appears well-developed and well-nourished. No distress.  HENT:  Head: Normocephalic and atraumatic.  Eyes: Conjunctivae are normal.  Neck: Neck supple.  Cardiovascular: Normal rate and regular rhythm.  No murmur heard. Pulmonary/Chest: Effort normal and breath sounds normal. No respiratory distress.  Musculoskeletal: She exhibits no edema.  Abrasion and mild swelling over tibial tubercle of right knee, significant tenderness over this area., nontender to palpation over patella, limited range of motion, will not fully extend actively, is able to passively.  Resisting passive flexion beyond 90.  Negative Lockman, negative anterior drawer/posterior drawer.  No laxity appreciated with varus and valgus stress.  Patient does have softening just inferiorly to patella  on right side compared to left, patient states this is normal for her.  Ambulating with significant antalgia  Neurological: She is alert.  Skin: Skin is warm and dry.  Psychiatric: She has a normal mood and affect.  Nursing note and vitals reviewed.    UC Treatments / Results  Labs (all  labs ordered are listed, but only abnormal results are displayed) Labs Reviewed - No data to display  EKG None  Radiology Dg Knee Complete 4 Views Right  Result Date: 04/24/2018 CLINICAL DATA:  Fall earlier today.  Right knee pain and swelling. EXAM: RIGHT KNEE - COMPLETE 4+ VIEW COMPARISON:  None. FINDINGS: No fracture, joint effusion or malalignment. No suspicious focal osseous lesions. No significant arthropathy. Enthesophyte at the right tibial tubercle. No radiopaque foreign body. IMPRESSION: No fracture, joint effusion or malalignment. Electronically Signed   By: Ilona Sorrel M.D.   On: 04/24/2018 20:11    Procedures Procedures (including critical care time)  Medications Ordered in UC Medications - No data to display  Initial Impression / Assessment and Plan / UC Course  I have reviewed the triage vital signs and the nursing notes.  Pertinent labs & imaging results that were available during my care of the patient were reviewed by me and considered in my medical decision making (see chart for details).     No fracture seen on x-ray, knee feels stable, but patient was resisting largely on exam.  Will provide with crutches, knee brace.  Will have follow-up with orthopedics.  NSAIDs for pain, ice. Discussed strict return precautions. Patient verbalized understanding and is agreeable with plan.  Final Clinical Impressions(s) / UC Diagnoses   Final diagnoses:  Fall, initial encounter  Right knee injury, initial encounter     Discharge Instructions     Use anti-inflammatories for pain/swelling. You may take up to 800 mg Ibuprofen every 8 hours with food. You may supplement Ibuprofen  with Tylenol [id number] mg every 8 hours.   Please use crutches and knee brace.  Please follow-up with orthopedics if symptoms not improving with time, or you are unable to fully extend your knee in the next couple of days.    ED Prescriptions    Medication Sig Dispense Auth. Provider   ibuprofen (ADVIL,MOTRIN) 800 MG tablet Take 1 tablet (800 mg total) by mouth 3 (three) times daily. 21 tablet Wieters, [place] C, PA-C     Controlled Substance Prescriptions [place] Controlled Substance Registry consulted? Not Applicable   Janith Lima, [place] 04/24/18 2031

## 2018-04-24 NOTE — ED Triage Notes (Signed)
The patient presented to the [facility] with a complaint of pain to the right knee secondary to a fall that occurred earlier today.

## 2018-04-24 NOTE — Discharge Instructions (Signed)
Use anti-inflammatories for pain/swelling. You may take up to 800 mg Ibuprofen every 8 hours with food. You may supplement Ibuprofen with Tylenol [phone] mg every 8 hours.   Please use crutches and knee brace.  Please follow-up with orthopedics if symptoms not improving with time, or you are unable to fully extend your knee in the next couple of days.

## 2018-08-08 ENCOUNTER — Ambulatory Visit: Payer: BC Managed Care – PPO | Admitting: Obstetrics & Gynecology

## 2018-08-27 ENCOUNTER — Encounter: Payer: Self-pay | Admitting: Obstetrics and Gynecology

## 2018-08-27 ENCOUNTER — Ambulatory Visit (INDEPENDENT_AMBULATORY_CARE_PROVIDER_SITE_OTHER): Payer: BC Managed Care – PPO | Admitting: Obstetrics and Gynecology

## 2018-08-27 ENCOUNTER — Other Ambulatory Visit: Payer: Self-pay | Admitting: Hematology and Oncology

## 2018-08-27 VITALS — BP 116/78 | HR 74 | Wt 203.0 lb

## 2018-08-27 DIAGNOSIS — Z1151 Encounter for screening for human papillomavirus (HPV): Secondary | ICD-10-CM | POA: Diagnosis not present

## 2018-08-27 DIAGNOSIS — Z853 Personal history of malignant neoplasm of breast: Secondary | ICD-10-CM

## 2018-08-27 DIAGNOSIS — Z124 Encounter for screening for malignant neoplasm of cervix: Secondary | ICD-10-CM

## 2018-08-27 DIAGNOSIS — Z01419 Encounter for gynecological examination (general) (routine) without abnormal findings: Secondary | ICD-10-CM

## 2018-08-27 NOTE — Progress Notes (Signed)
Subjective:     Alisha Chen is a 52 y.o. female P1 postmenopausal for the past 6 year with BMI 31 who is here for a comprehensive physical exam. The patient reports no problems. She is sexually active without complaints. She denies any episodes of vaginal bleeding. She denies any urinary incontinence. She denies any pelvic pain or abnormal discharge  Past Medical History:  Diagnosis Date  . Back pain   . 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 History of chemotherapy 03/23/12- 05/10/12   s/p 4 cycles of FEC   . Hx: UTI (urinary tract infection)   . S/P radiation therapy 07/10/12-08/23/12   Left Breast: 50 Gy/25 Fractions; Boost: 10 Gy/5 Fractions  . Status post chemotherapy 1 dose 05/23/12   Stopped after one cycle of Taxol due to Grade 2 Toxicity  . Status post chemotherapy 1 dose 05/23/12   Stopped after one cycle of Taxol due to Grade 2 Toxicity   Past Surgical History:  Procedure Laterality Date  . AXILLARY SURGERY  03/06/12   left, regional resection, 1/3 nodes pos  . BACK SURGERY     LUMBAR DECOMPRESSION  . BREAST BIOPSY  01/09/2012   left breast 3 0'clock/ER?PR =positive,her 2 neg  . BREAST LUMPECTOMY Left 02/10/12   Left Breast: 2 Foci of Invasive Ductal Caand High grade Ductal Carcinoma In Situ, 0/14 nodes Left Axilla Negative : Regional Resection of Lymph Nodes: 0/5 Nodes Negative  . FOOT SURGERY     multiple  . INTRAUTERINE DEVICE REMOVAL  01/24/12  . needle core Biospy  01/09/12   Left Breast: Invasive Ductal Carcinoma, Lymph Node Axilla: Metastatic Mammary Carcinoma  . PORTACATH PLACEMENT  02/10/2012   Procedure: INSERTION PORT-A-CATH;  Surgeon: Rolm Bookbinder, MD;  Location: [facility];  Service: General;  Laterality: Right;  . Regional Resection  03/06/12   Left Axilla: 1/3 Nodes Metastatic Carcinoma  . SPINE SURGERY  01/2011   Family History  Problem Relation Age of Onset  . Diabetes Mother   . Seizures Son      Social History   Socioeconomic History  . Marital status: Married    Spouse name: Not on file  . Number of children: 1  . Years of education: Not on file  . Highest education level: Not on file  Occupational History  . 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 Financial resource strain: Not on file  . Food insecurity:    Worry: Not on file    Inability: Not on file  . Transportation needs:    Medical: Not on file    Non-medical: Not on file  Tobacco Use  . Smoking status: Never Smoker  . Smokeless tobacco: Never Used  Substance and Sexual Activity  . Alcohol use: No  . Drug use: No  . Sexual activity: Yes    Partners: Male    Birth control/protection: None    Comment: menarche age 35,premenopausa; G!P1,1st pregnancy age 25  Lifestyle  . Physical activity:    Days per week: Not on file    Minutes per session: Not on file  . Stress: Not on file  Relationships  . Social connections:    Talks on phone: Not on file    Gets together: Not on file    Attends religious service: Not on file  Active member of club or organization: Not on file    Attends meetings of clubs or organizations: Not on file    Relationship status: Not on file  . Intimate partner violence:    Fear of current or ex partner: Not on file    Emotionally abused: Not on file    Physically abused: Not on file    Forced sexual activity: Not on file  Other Topics Concern  . Not on file  Social History Narrative   Married x 21 years. 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 HIV Screening  05/17/1980  . INFLUENZA VACCINE  06/21/2018  . MAMMOGRAM  10/16/2018  . PAP SMEAR  08/07/2020  . TETANUS/TDAP  09/19/2021  . COLONOSCOPY  09/09/2024       Review of Systems Pertinent items are noted in HPI.   Objective:  Blood pressure 116/78, pulse 74,  weight 203 lb (92.1 kg), last menstrual period 02/27/2012.     GENERAL: Well-developed, well-nourished female in no acute distress.  HEENT: Normocephalic, atraumatic. Sclerae anicteric.  NECK: Supple. Normal thyroid.  LUNGS: Clear to auscultation bilaterally.  HEART: Regular rate and rhythm. BREASTS: Symmetric in size. No palpable masses or lymphadenopathy, skin changes, or nipple drainage. ABDOMEN: Soft, nontender, nondistended. No organomegaly. PELVIC: Normal external female genitalia. Vagina is pink and rugated.  Normal discharge. Normal appearing cervix. Uterus is normal in size. No adnexal mass or tenderness. EXTREMITIES: No cyanosis, clubbing, or edema, 2+ distal pulses.    Assessment:    Healthy female exam.      Plan:    Pap smear collected Patient with normal pap smear 09/2017. Due for mammogram this year Patient will be contacted with abnormal results RTC in 1 year or prn See After Visit Summary for Counseling Recommendations

## 2018-08-27 NOTE — Progress Notes (Signed)
Patient presents for Annual Exam today.  Last pap: 08/07/2017 WNL  Mammogram:10/16/2017 STD Screening: Declines/  Married  LMP: Menopause since 2013      cc: None per pt .

## 2018-08-30 LAB — CYTOLOGY - PAP
Diagnosis: NEGATIVE
HPV: NOT DETECTED

## 2018-10-08 NOTE — Progress Notes (Signed)
Patient Care Team: Carollee Herter, Alferd Apa, DO as PCP - General (Family Medicine)  DIAGNOSIS:    ICD-10-CM   1. Primary cancer of upper outer quadrant of left female breast ([place]) C50.412     SUMMARY OF ONCOLOGIC HISTORY:   Primary cancer of upper outer quadrant of left female breast ([place])   03/12/2012 Surgery    left breast lumpectomy that revealed 2 foci of invasive ductal carcinoma measuring 1.3 cm (ER87%,PR 100% Ki 40%)and 0.8 cm (Er96%, PR 99% Ki 74%)grade 2 with adjacent high-grade ductal carcinoma in situ with no evidence of angiolymphatic invasion.1/22 LN    03/23/2012 - 05/10/2012 Chemotherapy    Adjuvant chemo FEC 100 foll by Taxol X 12    07/10/2012 - 08/23/2012 Radiation Therapy    Adj XRT    09/14/2012 - 10/05/2012 Anti-estrogen oral therapy    Tamoxifen 20 mg 3 weeks stopped because of intolerance     CHIEF COMPLIANT: Follow up of left breast cancer  INTERVAL HISTORY: Alisha Chen is a 52 y.o. with above-mentioned history of left breast cancer currently on surveillance but she is previously been treated with lumpectomy followed by adjuvant chemotherapy and radiation. She took tamoxifen briefly but was stopped because of intolerance. I last saw her one year ago. On 04/24/18 she went to the ED for right knee pain resulting from a fall, the x-ray was negative for a fracture. Her last mammogram on 10/16/17 showed no evidence of malignancy.   The patient presents to the clinic today alone. She denies any pain or discomfort except right knee pain. She denies any pain or discomfort in her breast. She denies abdominal pain or nausea. She received a breast exam by her gynecologist earlier this year. She reports exercising and attempts to lose weight and expressed interest in the visiting the weight loss clinic. Her next mammogram is scheduled for 10/17/18.    REVIEW OF SYSTEMS:  Constitutional: Denies fevers, chills or abnormal weight loss Eyes: Denies blurriness of vision Ears,  nose, mouth, throat, and face: Denies mucositis or sore throat Respiratory: Denies cough, dyspnea or wheezes Cardiovascular: Denies palpitation, chest discomfort Gastrointestinal:  Denies nausea, heartburn or change in bowel habits Skin: Denies abnormal skin rashes MSK: (+) right knee pain from recent fall Lymphatics: Denies new lymphadenopathy or easy bruising Neurological: Denies numbness, tingling or new weaknesses Behavioral/Psych: Mood is stable, no new changes  Extremities: No lower extremity edema Breast: denies any pain or lumps or nodules in either breasts All other systems were reviewed with the patient and are negative.  I have reviewed the past medical history, past surgical history, social history and family history with the patient and they are unchanged from previous note.  ALLERGIES:  is allergic to penicillins; shellfish-derived products; clarithromycin; and ondansetron.  MEDICATIONS:  Current Outpatient Medications  Medication Sig Dispense Refill  . cholecalciferol (VITAMIN D) 1000 UNITS tablet Take 1,000 Units by mouth daily.    Marland Kitchen ibuprofen (ADVIL,MOTRIN) 800 MG tablet Take 1 tablet (800 mg total) by mouth 3 (three) times daily. 21 tablet 0  . vitamin B-12 (CYANOCOBALAMIN) 1000 MCG tablet Take 1,000 mcg by mouth daily.     No current facility-administered medications for this visit.     PHYSICAL EXAMINATION: ECOG PERFORMANCE STATUS: 0 - Asymptomatic  Vitals:   10/11/18 1115  BP: 128/83  Pulse: 79  Resp: 18  Temp: 97.7 F (36.5 C)  SpO2: 98%   Filed Weights   10/11/18 1115  Weight: 198 lb 14.4 oz (  90.2 kg)    GENERAL:alert, no distress and comfortable SKIN: skin color, texture, turgor are normal, no rashes or significant lesions EYES: normal, Conjunctiva are pink and non-injected, sclera clear OROPHARYNX:no exudate, no erythema and lips, buccal mucosa, and tongue normal  NECK: supple, thyroid normal size, non-tender, without nodularity LYMPH:  no  palpable lymphadenopathy in the cervical, axillary or inguinal LUNGS: clear to auscultation and percussion with normal breathing effort HEART: regular rate & rhythm and no murmurs and no lower extremity edema ABDOMEN:abdomen soft, non-tender and normal bowel sounds MUSCULOSKELETAL:no cyanosis of digits and no clubbing  NEURO: alert & oriented x 3 with fluent speech, no focal motor/sensory deficits EXTREMITIES: No lower extremity edema  LABORATORY DATA:  I have reviewed the data as listed CMP Latest Ref Rng & Units 12/25/2014 08/14/2014 12/13/2013  Glucose 70 - 140 mg/dl 71 78 79  BUN 7.0 - 26.0 mg/dL 22.5 15 19.4  Creatinine 0.6 - 1.1 mg/dL 0.9 0.9 1.2(H)  Sodium 136 - 145 mEq/L 140 139 140  Potassium 3.5 - 5.1 mEq/L 4.4 4.6 4.0  Chloride 96 - 112 mEq/L - 104 -  CO2 22 - 29 mEq/L 27 29 29   Calcium 8.4 - 10.4 mg/dL 10.2 10.6(H) 10.6(H)  Total Protein 6.4 - 8.3 g/dL 6.9 7.6 7.2  Total Bilirubin 0.20 - 1.20 mg/dL 0.28 0.4 0.33  Alkaline Phos 40 - 150 U/L 116 110 108  AST 5 - 34 U/L 19 19 19   ALT 0 - 55 U/L 19 18 20     Lab Results  Component Value Date   WBC 5.1 12/25/2014   HGB 11.7 12/25/2014   HCT 35.9 12/25/2014   MCV 89.1 12/25/2014   PLT 175 12/25/2014   NEUTROABS 3.0 12/25/2014    ASSESSMENT & PLAN:  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 The primary tumor revealed 2 foci measuring 1.3 and 0.8 cm.with 1/22 LN positive for metastatic disease. She has completed 4 cycles of FEC 100.followed by Taxol. Could not tolerate tamoxifen for more than 3 weeks. We gave her a prescription for anastrozole but she does not want to take it  Breast Cancer Surveillance: 1. Breast exam 10/11/2018 normal 2. Mammogram scheduled for 10/17/2018  Weight issues: Patient is exercising for an hour every day. In spite of this she is not losing weight. I discussed with  her the concept of intermittent fasting and she is interested in it and will consider it. I provided her information regarding the weight loss clinic at [facility].  Return to clinic in 1 year for follow-up with  long-term survivorship    No orders of the defined types were placed in this encounter.  The patient has a good understanding of the overall plan. she agrees with it. she will call with any problems that may develop before the next visit here.  Nicholas Lose, MD 10/11/2018   I, Cloyde Reams Dorshimer, am acting as scribe for Nicholas Lose, MD.  I have reviewed the above documentation for accuracy and completeness, and I agree with the above.

## 2018-10-11 ENCOUNTER — Telehealth: Payer: Self-pay | Admitting: Hematology and Oncology

## 2018-10-11 ENCOUNTER — Inpatient Hospital Stay: Payer: BC Managed Care – PPO | Attending: Hematology and Oncology | Admitting: Hematology and Oncology

## 2018-10-11 DIAGNOSIS — Z923 Personal history of irradiation: Secondary | ICD-10-CM

## 2018-10-11 DIAGNOSIS — Z79899 Other long term (current) drug therapy: Secondary | ICD-10-CM | POA: Insufficient documentation

## 2018-10-11 DIAGNOSIS — Z791 Long term (current) use of non-steroidal anti-inflammatories (NSAID): Secondary | ICD-10-CM | POA: Insufficient documentation

## 2018-10-11 DIAGNOSIS — C50412 Malignant neoplasm of upper-outer quadrant of left female breast: Secondary | ICD-10-CM

## 2018-10-11 DIAGNOSIS — Z853 Personal history of malignant neoplasm of breast: Secondary | ICD-10-CM | POA: Insufficient documentation

## 2018-10-11 DIAGNOSIS — Z9221 Personal history of antineoplastic chemotherapy: Secondary | ICD-10-CM | POA: Diagnosis not present

## 2018-10-11 NOTE — Assessment & Plan Note (Signed)
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 The primary tumor revealed 2 foci measuring 1.3 and 0.8 cm.with 1/22 LN positive for metastatic disease. She has completed 4 cycles of FEC 100.followed by Taxol. Could not tolerate tamoxifen for more than 3 weeks. We gave her a prescription for anastrozole but she does not want to take it  Breast Cancer Surveillance: 1. Breast exam 10/11/2018 normal 2. Mammogram scheduled for 10/17/2018  Weight issues: Patient is exercising for an hour every day. In spite of this she is not losing weight. I discussed with her the concept of intermittent fasting and she is interested in it and will consider it.  Return to clinic in 1 year for follow-up with survivorship

## 2018-10-11 NOTE — Telephone Encounter (Signed)
Gave patient avs and calendar.   °

## 2018-10-15 ENCOUNTER — Other Ambulatory Visit: Payer: Self-pay

## 2018-10-15 ENCOUNTER — Other Ambulatory Visit: Payer: Self-pay | Admitting: Hematology and Oncology

## 2018-10-15 DIAGNOSIS — Z853 Personal history of malignant neoplasm of breast: Secondary | ICD-10-CM

## 2018-10-30 ENCOUNTER — Ambulatory Visit
Admission: RE | Admit: 2018-10-30 | Discharge: 2018-10-30 | Disposition: A | Discharge status: Home / Self Care | Payer: BC Managed Care – PPO | Source: Ambulatory Visit | Attending: Hematology and Oncology | Admitting: Hematology and Oncology

## 2018-10-30 ENCOUNTER — Other Ambulatory Visit: Payer: Self-pay | Admitting: Hematology and Oncology

## 2018-10-30 DIAGNOSIS — Z1231 Encounter for screening mammogram for malignant neoplasm of breast: Secondary | ICD-10-CM

## 2018-10-30 DIAGNOSIS — Z853 Personal history of malignant neoplasm of breast: Secondary | ICD-10-CM

## 2018-11-29 ENCOUNTER — Encounter (INDEPENDENT_AMBULATORY_CARE_PROVIDER_SITE_OTHER): Payer: BC Managed Care – PPO

## 2018-12-10 ENCOUNTER — Encounter (INDEPENDENT_AMBULATORY_CARE_PROVIDER_SITE_OTHER): Payer: Self-pay | Admitting: Bariatrics

## 2018-12-10 ENCOUNTER — Ambulatory Visit (INDEPENDENT_AMBULATORY_CARE_PROVIDER_SITE_OTHER): Payer: BC Managed Care – PPO | Admitting: Bariatrics

## 2018-12-10 VITALS — BP 134/84 | HR 66 | Temp 97.7°F | Ht 66.0 in | Wt 201.0 lb

## 2018-12-10 DIAGNOSIS — R5383 Other fatigue: Secondary | ICD-10-CM

## 2018-12-10 DIAGNOSIS — Z6832 Body mass index (BMI) 32.0-32.9, adult: Secondary | ICD-10-CM

## 2018-12-10 DIAGNOSIS — Z1331 Encounter for screening for depression: Secondary | ICD-10-CM

## 2018-12-10 DIAGNOSIS — Z0289 Encounter for other administrative examinations: Secondary | ICD-10-CM

## 2018-12-10 DIAGNOSIS — E559 Vitamin D deficiency, unspecified: Secondary | ICD-10-CM | POA: Diagnosis not present

## 2018-12-10 DIAGNOSIS — E538 Deficiency of other specified B group vitamins: Secondary | ICD-10-CM

## 2018-12-10 DIAGNOSIS — E669 Obesity, unspecified: Secondary | ICD-10-CM

## 2018-12-10 DIAGNOSIS — Z9189 Other specified personal risk factors, not elsewhere classified: Secondary | ICD-10-CM | POA: Diagnosis not present

## 2018-12-10 DIAGNOSIS — R0602 Shortness of breath: Secondary | ICD-10-CM | POA: Diagnosis not present

## 2018-12-10 DIAGNOSIS — E66811 Obesity, class 1: Secondary | ICD-10-CM

## 2018-12-10 DIAGNOSIS — M25562 Pain in left knee: Secondary | ICD-10-CM

## 2018-12-10 DIAGNOSIS — M25561 Pain in right knee: Secondary | ICD-10-CM

## 2018-12-11 LAB — CBC WITH DIFFERENTIAL
BASOS ABS: 0.1 10*3/uL (ref 0.0–0.2)
BASOS: 1 %
EOS (ABSOLUTE): 0.1 10*3/uL (ref 0.0–0.4)
Eos: 2 %
HEMOGLOBIN: 12.4 g/dL (ref 11.1–15.9)
Hematocrit: 37.3 % (ref 34.0–46.6)
Immature Grans (Abs): 0 10*3/uL (ref 0.0–0.1)
Immature Granulocytes: 0 %
LYMPHS ABS: 1.3 10*3/uL (ref 0.7–3.1)
Lymphs: 26 %
MCH: 29 pg (ref 26.6–33.0)
MCHC: 33.2 g/dL (ref 31.5–35.7)
MCV: 87 fL (ref 79–97)
Monocytes Absolute: 0.2 10*3/uL (ref 0.1–0.9)
Monocytes: 5 %
NEUTROS ABS: 3.2 10*3/uL (ref 1.4–7.0)
Neutrophils: 66 %
RBC: 4.27 x10E6/uL (ref 3.77–5.28)
RDW: 12.2 % (ref 11.7–15.4)
WBC: 4.9 10*3/uL (ref 3.4–10.8)

## 2018-12-11 LAB — LIPID PANEL WITH LDL/HDL RATIO
Cholesterol, Total: 202 mg/dL — ABNORMAL HIGH (ref 100–199)
HDL: 57 mg/dL (ref >39)
LDL CALC: 133 mg/dL — AB (ref 0–99)
LDL/HDL RATIO: 2.3 ratio (ref 0.0–3.2)
TRIGLYCERIDES: 61 mg/dL (ref 0–149)
VLDL CHOLESTEROL CAL: 12 mg/dL (ref 5–40)

## 2018-12-11 LAB — VITAMIN B12: Vitamin B-12: 1556 pg/mL — ABNORMAL HIGH (ref 232–1245)

## 2018-12-11 LAB — COMPREHENSIVE METABOLIC PANEL
ALBUMIN: 4.5 g/dL (ref 3.8–4.9)
ALK PHOS: 117 IU/L (ref 39–117)
ALT: 16 IU/L (ref 0–32)
AST: 18 IU/L (ref 0–40)
Albumin/Globulin Ratio: 1.7 (ref 1.2–2.2)
BUN / CREAT RATIO: 19 (ref 9–23)
BUN: 15 mg/dL (ref 6–24)
Bilirubin Total: 0.3 mg/dL (ref 0.0–1.2)
CALCIUM: 10.5 mg/dL — AB (ref 8.7–10.2)
CO2: 23 mmol/L (ref 20–29)
CREATININE: 0.81 mg/dL (ref 0.57–1.00)
Chloride: 100 mmol/L (ref 96–106)
GFR calc Af Amer: 96 mL/min/{1.73_m2} (ref >59)
GFR, EST NON AFRICAN AMERICAN: 83 mL/min/{1.73_m2} (ref >59)
GLOBULIN, TOTAL: 2.7 g/dL (ref 1.5–4.5)
Glucose: 71 mg/dL (ref 65–99)
POTASSIUM: 4.6 mmol/L (ref 3.5–5.2)
SODIUM: 139 mmol/L (ref 134–144)
Total Protein: 7.2 g/dL (ref 6.0–8.5)

## 2018-12-11 LAB — FOLATE: FOLATE: 3.7 ng/mL (ref >3.0)

## 2018-12-11 LAB — INSULIN, RANDOM: INSULIN: 16.6 u[IU]/mL (ref 2.6–24.9)

## 2018-12-11 LAB — T3: T3, Total: 117 ng/dL (ref 71–180)

## 2018-12-11 LAB — TSH: TSH: 4.76 u[IU]/mL — ABNORMAL HIGH (ref 0.450–4.500)

## 2018-12-11 LAB — VITAMIN D 25 HYDROXY (VIT D DEFICIENCY, FRACTURES): Vit D, 25-Hydroxy: 43.1 ng/mL (ref 30.0–100.0)

## 2018-12-11 LAB — HEMOGLOBIN A1C
Est. average glucose Bld gHb Est-mCnc: 94 mg/dL
HEMOGLOBIN A1C: 4.9 % (ref 4.8–5.6)

## 2018-12-11 LAB — T4, FREE: Free T4: 1.09 ng/dL (ref 0.82–1.77)

## 2018-12-11 NOTE — Progress Notes (Signed)
Office: [phone]  /  Fax: [phone]   Dear Dr. Carollee Chen,   Thank you for referring Alisha Chen to our clinic. The following note includes my evaluation and treatment recommendations.  HPI:   Chief Complaint: OBESITY    Alisha Chen has been referred by Alisha Held, DO for consultation regarding her obesity and obesity related comorbidities.    Alisha Chen (MR# [id number]) is a 53 y.o. female who presents on 12/11/2018 for obesity evaluation and treatment. Current BMI is Body mass index is 32.44 kg/m.Marland Kitchen Alisha Chen has been struggling with her weight for many years and has been unsuccessful in either losing weight, maintaining weight loss, or reaching her healthy weight goal.     Alisha Chen attended our information session and states she is currently in the action stage of change and ready to dedicate time achieving and maintaining a healthier weight. Alisha Chen is interested in becoming our patient and working on intensive lifestyle modifications including (but not limited to) diet, exercise and weight loss.     Alisha Chen states her family eats meals together she struggles with family and or coworkers weight loss sabotage her desired weight loss is 31 lbs she started gaining weight after 2013 her heaviest weight ever was 201 lbs. she considers herself a "picky eater"  she craves carbs, sweets and some protein she snacks frequently in the evenings she skips meals frequently she is frequently drinking liquids with calories she frequently makes poor food choices she has problems with excessive hunger  she frequently eats larger portions than normal  she has binge eating behaviors she struggles with emotional eating  she states that she has lactose intolerance she is allergic to shrimp and shellfish   Fatigue Alisha Chen feels her energy is lower than it should be. This has worsened with weight gain and has not worsened recently. Alisha Chen admits to daytime somnolence and she  admits to waking up still tired. Patient is at risk for obstructive sleep apnea. Patent has a history of symptoms of daytime fatigue, morning fatigue and morning headache. Patient generally gets 7 hours of sleep per night, and states they generally have restful sleep. Snoring is present. Apneic episodes are not present. Epworth Sleepiness Score is 7  Dyspnea on exertion Alisha Chen notes increasing shortness of breath with certain activities (climbing stairs) and seems to be worsening over time with weight gain. She notes getting out of breath sooner with certain activities (climbing stairs) than she used to. This has not gotten worse recently. Alisha Chen denies orthopnea.  Vitamin D deficiency Alisha Chen has a diagnosis of vitamin D deficiency. She is currently taking OTC vit D3 and denies nausea, vomiting or muscle weakness.  At risk for osteopenia and osteoporosis Alisha Chen is at higher risk of osteopenia and osteoporosis due to vitamin D deficiency.    B12 Deficiency Alisha Chen has a diagnosis of B12 insufficiency. Alisha Chen is not a vegetarian. She does not have a history of weight loss surgery. She denies paresthesia, nausea or vomiting.  Bilateral Knee Pain Alisha Chen has bilateral knee pain and she also complains of back and neck pain. She has increased pain with exercising on the elliptical.  Depression Screen Alisha Chen's Food and Mood (modified PHQ-9) score was  Depression screen PHQ 2/9 12/10/2018  Decreased Interest 3  Down, Depressed, Hopeless 2  PHQ - 2 Score 5  Altered sleeping 2  Tired, decreased energy 2  Change in appetite 2  Feeling bad or failure about yourself  1  Trouble concentrating 2  Moving  slowly or fidgety/restless 2  PHQ-9 Score 16  Difficult doing work/chores Not difficult at all  Some recent data might be hidden    ASSESSMENT AND PLAN:  Other fatigue - Plan: EKG 12-Lead, Comprehensive metabolic panel, CBC With Differential, Hemoglobin A1c, Insulin, random, Lipid Panel With LDL/HDL Ratio,  Folate, T3, T4, free, TSH  SOB (shortness of breath) on exertion - Plan: Comprehensive metabolic panel, CBC With Differential, Hemoglobin A1c, Insulin, random, Lipid Panel With LDL/HDL Ratio, Folate, T3, T4, free, TSH  Vitamin D deficiency - Plan: VITAMIN D 25 Hydroxy (Vit-D Deficiency, Fractures)  B12 deficiency - Plan: Vitamin B12, Folate  Pain in both knees, unspecified chronicity  Depression screening  At risk for osteoporosis  Class 1 obesity with serious comorbidity and body mass index (BMI) of 32.0 to 32.9 in adult, unspecified obesity type  PLAN:  Fatigue Alisha Chen was informed that her fatigue may be related to obesity, depression or many other causes. Labs will be ordered, and in the meanwhile Alisha Chen has agreed to work on diet, exercise and weight loss to help with fatigue. Proper sleep hygiene was discussed including the need for 7-8 hours of quality sleep each night. A sleep study was not ordered based on symptoms and Epworth score.  Dyspnea on exertion Alisha Chen's shortness of breath appears to be obesity related and exercise induced. She has agreed to work on weight loss and gradually increase exercise to treat her exercise induced shortness of breath. If Krishana follows our instructions and loses weight without improvement of her shortness of breath, we will plan to refer to pulmonology. We will monitor this condition regularly. [name] agrees to this plan.  Vitamin D Deficiency Alisha Chen was informed that low vitamin D levels contributes to fatigue and are associated with obesity, breast, and colon Chen. She will continue to take OTC Vit D3 and will follow up for routine testing of vitamin D, at least 2-3 times per year. She was informed of the risk of over-replacement of vitamin D and agrees to not increase her dose unless she discusses this with [place] first. We will check vitamin D level today and Alisha Chen will follow up at the agreed upon time.  At risk for osteopenia and  osteoporosis Alisha Chen was given extended  (15 minutes) osteoporosis prevention counseling today. Alisha Chen is at risk for osteopenia and osteoporosis due to her vitamin D deficiency. She was encouraged to take her vitamin D and follow her higher calcium diet and increase strengthening exercise to help strengthen her bones and decrease her risk of osteopenia and osteoporosis.  B12 Deficiency Alisha Chen will work on increasing B12 rich foods in her diet. We will check vitamin B12 level and folate today. Alisha Chen will follow up with our clinic in 2 weeks.  Bilateral Knee Pain Alisha Chen will work on weight loss and gradually increase exercise. She will follow up with our clinic in 2 weeks.  Depression Screen Alisha Chen had a strongly positive depression screening. Depression is commonly associated with obesity and often results in emotional eating behaviors. We will monitor this closely and work on CBT to help improve the non-hunger eating patterns. Referral to Psychology may be required if no improvement is seen as she continues in our clinic.  Obesity Alisha Chen is currently in the action stage of change and her goal is to continue with weight loss efforts. I recommend Alisha Chen begin the structured treatment plan as follows:  She has agreed to follow the Category 2 plan Gradie has been instructed to eventually work up to  a goal of 150 minutes of combined cardio and strengthening exercise per week for weight loss and overall health benefits. We discussed the following Behavioral Modification Strategies today: increase H2O intake, keeping healthy foods in the home, increasing lean protein intake, decreasing simple carbohydrates, increasing vegetables, work on triggers and work on meal planning and easy cooking plans   She was informed of the importance of frequent follow up visits to maximize her success with intensive lifestyle modifications for her multiple health conditions. She was informed we would discuss her lab results at  her next visit unless there is a critical issue that needs to be addressed sooner. Orian agreed to keep her next visit at the agreed upon time to discuss these results.  ALLERGIES: Allergies  Allergen Reactions  . Penicillins Other (See Comments)    Not sure, but was told by mother never to take this medication as a child  . Shellfish-Derived Products Other (See Comments)    Welts  . Clarithromycin Other (See Comments)    REACTION: NAUSEA,WEAK,BITTER TASTE  . Ondansetron Nausea And Vomiting    Headaches    MEDICATIONS: Current Outpatient Medications on File Prior to Visit  Medication Sig Dispense Refill  . cholecalciferol (VITAMIN D) 1000 UNITS tablet Take 1,000 Units by mouth daily.    Marland Kitchen ibuprofen (ADVIL,MOTRIN) 800 MG tablet Take 1 tablet (800 mg total) by mouth 3 (three) times daily. 21 tablet 0  . Misc Natural Products (COLON CLEANSE PO) Take by mouth 3 (three) times daily. 3 pills nightly qhs    . vitamin B-12 (CYANOCOBALAMIN) 1000 MCG tablet Take 1,000 mcg by mouth daily.    . celecoxib (CELEBREX) 100 MG capsule Take 100 mg by mouth 2 (two) times daily.     No current facility-administered medications on file prior to visit.     PAST MEDICAL HISTORY: Past Medical History:  Diagnosis Date  . Anemia   . Back pain   . 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 History of chemotherapy 03/23/12- 05/10/12   s/p 4 cycles of FEC   . Hx: UTI (urinary tract infection)   . Knee pain   . Lactose intolerance   . Neck pain   . S/P radiation therapy 07/10/12-08/23/12   Left Breast: 50 Gy/25 Fractions; Boost: 10 Gy/5 Fractions  . Status post chemotherapy 1 dose 05/23/12   Stopped after one cycle of Taxol due to Grade 2 Toxicity  . Status post chemotherapy 1 dose 05/23/12   Stopped after one cycle of Taxol due to Grade 2 Toxicity    PAST SURGICAL HISTORY: Past Surgical History:  Procedure Laterality Date  . AXILLARY SURGERY  03/06/12   left,  regional resection, 1/3 nodes pos  . BACK SURGERY     LUMBAR DECOMPRESSION  . BREAST BIOPSY  01/09/2012   left breast 3 0'clock/ER?PR =positive,her 2 neg  . BREAST LUMPECTOMY Left 02/10/12   Left Breast: 2 Foci of Invasive Ductal Caand High grade Ductal Carcinoma In Situ, 0/14 nodes Left Axilla Negative : Regional Resection of Lymph Nodes: 0/5 Nodes Negative  . FOOT SURGERY     multiple  . INTRAUTERINE DEVICE REMOVAL  01/24/12  . needle core Biospy  01/09/12   Left Breast: Invasive Ductal Carcinoma, Lymph Node Axilla: Metastatic Mammary Carcinoma  . PORTACATH PLACEMENT  02/10/2012   Procedure: INSERTION PORT-A-CATH;  Surgeon: Rolm Bookbinder, MD;  Location: [facility];  Service: General;  Laterality: Right;  . Regional  Resection  03/06/12   Left Axilla: 1/3 Nodes Metastatic Carcinoma  . SPINE SURGERY  01/2011    SOCIAL HISTORY: Social History   Tobacco Use  . Smoking status: Never Smoker  . Smokeless tobacco: Never Used  Substance Use Topics  . Alcohol use: No  . Drug use: No    FAMILY HISTORY: Family History  Problem Relation Age of Onset  . Diabetes Mother   . Sudden death Mother   . Schizophrenia Mother   . Sleep apnea Mother   . Obesity Mother   . Seizures Son     ROS: Review of Systems  Constitutional: Positive for malaise/fatigue.  HENT: Positive for nosebleeds.   Eyes: Positive for redness.       + Vision Changes + Wear Glasses or Contacts + Flashes of Light + Floaters  Respiratory: Positive for shortness of breath (on exertion).   Cardiovascular: Negative for orthopnea.  Gastrointestinal: Negative for nausea and vomiting.  Musculoskeletal: Positive for back pain and neck pain.       Negative for muscle weakness Positive for knee pain bilaterally  Skin: Positive for itching.  Neurological: Positive for headaches. Negative for tingling.    PHYSICAL EXAM: Blood pressure 134/84, pulse 66, temperature 97.7 F (36.5 C), temperature source Oral,  height 5\' 6"  (1.676 m), weight 201 lb (91.2 kg), last menstrual period 02/27/2012, SpO2 100 %. Body mass index is 32.44 kg/m. Physical Exam Vitals signs reviewed.  Constitutional:      Appearance: Normal appearance. She is well-developed. She is obese.  HENT:     Head: Normocephalic and atraumatic.     Nose: Nose normal.     Mouth/Throat:     Comments: Mallampati = 4 Eyes:     General: No scleral icterus.    Extraocular Movements: Extraocular movements intact.  Neck:     Musculoskeletal: Normal range of motion and neck supple.     Thyroid: No thyromegaly.  Cardiovascular:     Rate and Rhythm: Normal rate and regular rhythm.  Pulmonary:     Effort: Pulmonary effort is normal. No respiratory distress.  Abdominal:     Palpations: Abdomen is soft.     Tenderness: There is no abdominal tenderness.  Musculoskeletal: Normal range of motion.     Comments: Range of Motion normal in all 4 extremities  Skin:    General: Skin is warm and dry.  Neurological:     Mental Status: She is alert and oriented to person, place, and time.     Coordination: Coordination normal.  Psychiatric:        Mood and Affect: Mood normal.        Behavior: Behavior normal.     RECENT LABS AND TESTS: BMET    Component Value Date/Time   NA 139 12/10/2018 1151   NA 140 12/25/2014 1349   K 4.6 12/10/2018 1151   K 4.4 12/25/2014 1349   CL 100 12/10/2018 1151   CL 105 12/21/2012 1527   CO2 23 12/10/2018 1151   CO2 27 12/25/2014 1349   GLUCOSE 71 12/10/2018 1151   GLUCOSE 71 12/25/2014 1349   GLUCOSE 75 12/21/2012 1527   BUN 15 12/10/2018 1151   BUN 22.5 12/25/2014 1349   CREATININE 0.81 12/10/2018 1151   CREATININE 0.9 12/25/2014 1349   CALCIUM 10.5 (H) 12/10/2018 1151   CALCIUM 10.2 12/25/2014 1349   GFRNONAA 83 12/10/2018 1151   GFRAA 96 12/10/2018 1151   Lab Results  Component Value Date   HGBA1C  4.9 12/10/2018   Lab Results  Component Value Date   INSULIN 16.6 12/10/2018   CBC     Component Value Date/Time   WBC 4.9 12/10/2018 1151   WBC 5.1 12/25/2014 1348   WBC 4.0 08/14/2014 0933   RBC 4.27 12/10/2018 1151   RBC 4.03 12/25/2014 1348   RBC 4.32 08/14/2014 0933   HGB 12.4 12/10/2018 1151   HGB 11.7 12/25/2014 1348   HCT 37.3 12/10/2018 1151   HCT 35.9 12/25/2014 1348   PLT 175 12/25/2014 1348   MCV 87 12/10/2018 1151   MCV 89.1 12/25/2014 1348   MCH 29.0 12/10/2018 1151   MCH 29.0 12/25/2014 1348   MCH 27.1 02/09/2012 0900   MCHC 33.2 12/10/2018 1151   MCHC 32.6 12/25/2014 1348   MCHC 33.1 08/14/2014 0933   RDW 12.2 12/10/2018 1151   RDW 12.6 12/25/2014 1348   LYMPHSABS 1.3 12/10/2018 1151   LYMPHSABS 1.6 12/25/2014 1348   MONOABS 0.3 12/25/2014 1348   EOSABS 0.1 12/10/2018 1151   BASOSABS 0.1 12/10/2018 1151   BASOSABS 0.0 12/25/2014 1348   Iron/TIBC/Ferritin/ %Sat No results found for: IRON, TIBC, FERRITIN, IRONPCTSAT Lipid Panel     Component Value Date/Time   CHOL 202 (H) 12/10/2018 1151   TRIG 61 12/10/2018 1151   HDL 57 12/10/2018 1151   CHOLHDL 3 08/14/2014 0933   VLDL 9.6 08/14/2014 0933   LDLCALC 133 (H) 12/10/2018 1151   Hepatic Function Panel     Component Value Date/Time   PROT 7.2 12/10/2018 1151   PROT 6.9 12/25/2014 1349   ALBUMIN 4.5 12/10/2018 1151   ALBUMIN 3.7 12/25/2014 1349   AST 18 12/10/2018 1151   AST 19 12/25/2014 1349   ALT 16 12/10/2018 1151   ALT 19 12/25/2014 1349   ALKPHOS 117 12/10/2018 1151   ALKPHOS 116 12/25/2014 1349   BILITOT 0.3 12/10/2018 1151   BILITOT 0.28 12/25/2014 1349   BILIDIR 0.1 08/14/2014 0933      Component Value Date/Time   TSH 4.760 (H) 12/10/2018 1151   TSH 3.50 08/14/2014 0933   TSH 3.14 09/20/2011 1558     Ref. Range 06/10/2013 14:11  Vitamin D, 25-Hydroxy Latest Ref Range: 30 - 89 ng/mL 37    ECG  shows NSR with a rate of 62 BPM INDIRECT CALORIMETER done today shows a VO2 of 209 and a REE of 1457.  Her calculated basal metabolic rate is 2330 thus her basal metabolic  rate is worse than expected.       OBESITY BEHAVIORAL INTERVENTION VISIT  Today's visit was # 1   Starting weight: 201 lbs Starting date: 12/10/2018 Today's weight : 201 lbs  Today's date: 12/10/2018 Total lbs lost to date: 0   ASK: We discussed the diagnosis of obesity with Alisha Chen today and Alisha Chen agreed to give [place] permission to discuss obesity behavioral modification therapy today.  ASSESS: Bronwyn has the diagnosis of obesity and her BMI today is 32.46 Alisha Chen is in the action stage of change   ADVISE: Alisha Chen was educated on the multiple health risks of obesity as well as the benefit of weight loss to improve her health. She was advised of the need for long term treatment and the importance of lifestyle modifications to improve her current health and to decrease her risk of future health problems.  AGREE: Multiple dietary modification options and treatment options were discussed and  Alisha Chen agreed to follow the recommendations documented in the above note.  ARRANGE: Alisha Chen  was educated on the importance of frequent visits to treat obesity as outlined per CMS and USPSTF guidelines and agreed to schedule her next follow up appointment today.  Corey Skains, am acting as Location manager for [employer]. Owens Shark, DO  I have reviewed the above documentation for accuracy and completeness, and I agree with the above. -Jearld Lesch, DO

## 2018-12-12 ENCOUNTER — Encounter (INDEPENDENT_AMBULATORY_CARE_PROVIDER_SITE_OTHER): Payer: Self-pay | Admitting: Bariatrics

## 2018-12-12 DIAGNOSIS — E669 Obesity, unspecified: Secondary | ICD-10-CM | POA: Insufficient documentation

## 2018-12-12 DIAGNOSIS — Z6831 Body mass index (BMI) 31.0-31.9, adult: Secondary | ICD-10-CM

## 2018-12-21 ENCOUNTER — Encounter (INDEPENDENT_AMBULATORY_CARE_PROVIDER_SITE_OTHER): Payer: Self-pay | Admitting: Bariatrics

## 2018-12-24 ENCOUNTER — Ambulatory Visit (INDEPENDENT_AMBULATORY_CARE_PROVIDER_SITE_OTHER): Payer: BC Managed Care – PPO | Admitting: Bariatrics

## 2018-12-24 ENCOUNTER — Encounter: Payer: Self-pay | Admitting: Family Medicine

## 2018-12-24 ENCOUNTER — Encounter (INDEPENDENT_AMBULATORY_CARE_PROVIDER_SITE_OTHER): Payer: Self-pay | Admitting: Bariatrics

## 2018-12-24 VITALS — BP 106/72 | HR 69 | Temp 98.0°F | Ht 66.0 in | Wt 196.0 lb

## 2018-12-24 DIAGNOSIS — K5909 Other constipation: Secondary | ICD-10-CM

## 2018-12-24 DIAGNOSIS — R7989 Other specified abnormal findings of blood chemistry: Secondary | ICD-10-CM | POA: Diagnosis not present

## 2018-12-24 DIAGNOSIS — E559 Vitamin D deficiency, unspecified: Secondary | ICD-10-CM

## 2018-12-24 DIAGNOSIS — E8881 Metabolic syndrome: Secondary | ICD-10-CM | POA: Diagnosis not present

## 2018-12-24 DIAGNOSIS — Z9189 Other specified personal risk factors, not elsewhere classified: Secondary | ICD-10-CM | POA: Diagnosis not present

## 2018-12-24 DIAGNOSIS — E669 Obesity, unspecified: Secondary | ICD-10-CM

## 2018-12-24 DIAGNOSIS — Z6831 Body mass index (BMI) 31.0-31.9, adult: Secondary | ICD-10-CM

## 2018-12-25 ENCOUNTER — Encounter (INDEPENDENT_AMBULATORY_CARE_PROVIDER_SITE_OTHER): Payer: Self-pay | Admitting: Bariatrics

## 2018-12-25 DIAGNOSIS — E8881 Metabolic syndrome: Secondary | ICD-10-CM | POA: Insufficient documentation

## 2018-12-25 NOTE — Progress Notes (Signed)
Office: [phone]  /  Fax: [phone]   HPI:   Chief Complaint: OBESITY Alisha Chen is here to discuss her progress with her obesity treatment plan. She is on the Category 2 plan and is following her eating plan approximately 90 to 95 % of the time. She states she is exercising 0 minutes 0 times per week. Alisha Chen states that she likes the diet, except that it is "a lot of meat". She did not feel hungry with the plan; as long as her plans were consistent and steady. She denies cravings. Her weight is 196 lb (88.9 kg) today and has had a weight loss of 5 pounds over a period of 2 weeks since her last visit. She has lost 5 lbs since starting treatment with [place].  Insulin Resistance Alisha Chen has a diagnosis of insulin resistance based on her elevated fasting insulin level >5. Although Alisha Chen's blood glucose readings are still under good control, insulin resistance puts her at greater risk of metabolic syndrome and diabetes. Her last A1c was at 4.9 and last insulin level was at 16.6 She is not taking metformin currently and continues to work on diet and exercise to decrease risk of diabetes. Alisha Chen denies polyphagia.  At risk for diabetes Alisha Chen is at higher than average risk for developing diabetes due to her obesity and insulin resistance. She currently denies polyuria or polydipsia.  Hypercalcemia (mild) borderline Her levels are barely into the mild hyperkalemia range.    TSH (slightly elevated) Alisha Chen has a slightly elevated TSH of 4.760   Vitamin D deficiency Alisha Chen has a diagnosis of vitamin D deficiency. Her last vitamin D level was at 43.1 She is currently taking OTC vit D and denies nausea, vomiting or muscle weakness.  Constipation Alisha Chen notes constipation for the last few weeks, worse since attempting weight loss. She states BM are less frequent and are not hard and painful. She denies abdominal pain, nausea or vomiting.   ASSESSMENT AND PLAN:  Insulin  resistance  Hypercalcemia  Elevated TSH  Vitamin D deficiency  Other constipation  At risk for diabetes mellitus  Class 1 obesity with serious comorbidity and body mass index (BMI) of 31.0 to 31.9 in adult, unspecified obesity type  PLAN:  Insulin Resistance Alisha Chen will continue to work on weight loss, exercise, increasing lean protein and decreasing simple carbohydrates in her diet to help decrease the risk of diabetes. She was informed that eating too many simple carbohydrates or too many calories at one sitting increases the likelihood of GI side effects. Alisha Chen agreed to follow up with [place] as directed to monitor her progress.  Diabetes risk counseling Alisha Chen was given extended (15 minutes) diabetes prevention counseling today. She is 53 y.o. female and has risk factors for diabetes including obesity and insulin resistance. We discussed intensive lifestyle modifications today with an emphasis on weight loss as well as increasing exercise and decreasing simple carbohydrates in her diet.  Hypercalcemia (mild) borderline We will watch her calcium levels and Alisha Chen will follow up with our clinic at the agreed upon time.  TSH (slightly elevated) We will watch her TSH and no action is needed at this time. Alisha Chen agreed to continue with her weight loss efforts with healthier diet and exercise and follow up as directed.  Vitamin D Deficiency Alisha Chen was informed that low vitamin D levels contributes to fatigue and are associated with obesity, breast, and colon Chen. She will continue to take OTC vitamin D and will follow up for routine testing of vitamin  D, at least 2-3 times per year. She was informed of the risk of over-replacement of vitamin D and agrees to not increase her dose unless she discusses this with [place] first.  Constipation Alisha Chen was informed decrease bowel movement frequency is normal while losing weight, but stools should not be hard or painful. She was advised to increase her H20  intake and work on increasing her fiber intake. High fiber foods were discussed today. She was advised to use FiberCon or MetaFiber, OTC Miralax/Clear Lax once daily.  Obesity Alisha Chen is currently in the action stage of change. As such, her goal is to continue with weight loss efforts She has agreed to follow the Category 2 plan Alisha Chen has been instructed to work up to a goal of 150 minutes of combined cardio and strengthening exercise per week for weight loss and overall health benefits. We discussed the following Behavioral Modification Strategies today: increase H2O intake, no skipping meals, better snacking choices, increasing lean protein intake, decreasing simple carbohydrates, increasing vegetables and work on meal planning and easy cooking plans  Alisha Chen has agreed to follow up with our clinic in 2 weeks. She was informed of the importance of frequent follow up visits to maximize her success with intensive lifestyle modifications for her multiple health conditions.  ALLERGIES: Allergies  Allergen Reactions  . Penicillins Other (See Comments)    Not sure, but was told by mother never to take this medication as a child  . Shellfish-Derived Products Other (See Comments)    Welts  . Clarithromycin Other (See Comments)    REACTION: NAUSEA,WEAK,BITTER TASTE  . Ondansetron Nausea And Vomiting    Headaches    MEDICATIONS: Current Outpatient Medications on File Prior to Visit  Medication Sig Dispense Refill  . celecoxib (CELEBREX) 100 MG capsule Take 100 mg by mouth 2 (two) times daily.    . cholecalciferol (VITAMIN D) 1000 UNITS tablet Take 1,000 Units by mouth daily.    Marland Kitchen ibuprofen (ADVIL,MOTRIN) 800 MG tablet Take 1 tablet (800 mg total) by mouth 3 (three) times daily. 21 tablet 0  . Misc Natural Products (COLON CLEANSE PO) Take by mouth 3 (three) times daily. 3 pills nightly qhs    . vitamin B-12 (CYANOCOBALAMIN) 1000 MCG tablet Take 1,000 mcg by mouth daily.     No current  facility-administered medications on file prior to visit.     PAST MEDICAL HISTORY: Past Medical History:  Diagnosis Date  . Anemia   . Back pain   . 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 History of chemotherapy 03/23/12- 05/10/12   s/p 4 cycles of FEC   . Hx: UTI (urinary tract infection)   . Knee pain   . Lactose intolerance   . Neck pain   . S/P radiation therapy 07/10/12-08/23/12   Left Breast: 50 Gy/25 Fractions; Boost: 10 Gy/5 Fractions  . Status post chemotherapy 1 dose 05/23/12   Stopped after one cycle of Taxol due to Grade 2 Toxicity  . Status post chemotherapy 1 dose 05/23/12   Stopped after one cycle of Taxol due to Grade 2 Toxicity    PAST SURGICAL HISTORY: Past Surgical History:  Procedure Laterality Date  . AXILLARY SURGERY  03/06/12   left, regional resection, 1/3 nodes pos  . BACK SURGERY     LUMBAR DECOMPRESSION  . BREAST BIOPSY  01/09/2012   left breast 3 0'clock/ER?PR =positive,her 2 neg  . BREAST LUMPECTOMY Left 02/10/12   Left  Breast: 2 Foci of Invasive Ductal Caand High grade Ductal Carcinoma In Situ, 0/14 nodes Left Axilla Negative : Regional Resection of Lymph Nodes: 0/5 Nodes Negative  . FOOT SURGERY     multiple  . INTRAUTERINE DEVICE REMOVAL  01/24/12  . needle core Biospy  01/09/12   Left Breast: Invasive Ductal Carcinoma, Lymph Node Axilla: Metastatic Mammary Carcinoma  . PORTACATH PLACEMENT  02/10/2012   Procedure: INSERTION PORT-A-CATH;  Surgeon: Rolm Bookbinder, MD;  Location: [facility];  Service: General;  Laterality: Right;  . Regional Resection  03/06/12   Left Axilla: 1/3 Nodes Metastatic Carcinoma  . SPINE SURGERY  01/2011    SOCIAL HISTORY: Social History   Tobacco Use  . Smoking status: Never Smoker  . Smokeless tobacco: Never Used  Substance Use Topics  . Alcohol use: No  . Drug use: No    FAMILY HISTORY: Family History  Problem Relation Age of Onset  . Diabetes Mother    . Sudden death Mother   . Schizophrenia Mother   . Sleep apnea Mother   . Obesity Mother   . Seizures Son     ROS: Review of Systems  Constitutional: Positive for weight loss.  Gastrointestinal: Positive for constipation. Negative for abdominal pain, nausea and vomiting.  Genitourinary: Negative for frequency.  Musculoskeletal:       Negative for muscle weakness  Endo/Heme/Allergies: Negative for polydipsia.       Negative for polyphagia    PHYSICAL EXAM: Blood pressure 106/72, pulse 69, temperature 98 F (36.7 C), temperature source Oral, height 5\' 6"  (1.676 m), weight 196 lb (88.9 kg), last menstrual period 02/27/2012, SpO2 98 %. Body mass index is 31.64 kg/m. Physical Exam Vitals signs reviewed.  Constitutional:      Appearance: Normal appearance. She is well-developed. She is obese.  Cardiovascular:     Rate and Rhythm: Normal rate.  Pulmonary:     Effort: Pulmonary effort is normal.  Musculoskeletal: Normal range of motion.  Skin:    General: Skin is warm and dry.  Neurological:     Mental Status: She is alert and oriented to person, place, and time.  Psychiatric:        Mood and Affect: Mood normal.        Behavior: Behavior normal.     RECENT LABS AND TESTS: BMET    Component Value Date/Time   NA 139 12/10/2018 1151   NA 140 12/25/2014 1349   K 4.6 12/10/2018 1151   K 4.4 12/25/2014 1349   CL 100 12/10/2018 1151   CL 105 12/21/2012 1527   CO2 23 12/10/2018 1151   CO2 27 12/25/2014 1349   GLUCOSE 71 12/10/2018 1151   GLUCOSE 71 12/25/2014 1349   GLUCOSE 75 12/21/2012 1527   BUN 15 12/10/2018 1151   BUN 22.5 12/25/2014 1349   CREATININE 0.81 12/10/2018 1151   CREATININE 0.9 12/25/2014 1349   CALCIUM 10.5 (H) 12/10/2018 1151   CALCIUM 10.2 12/25/2014 1349   GFRNONAA 83 12/10/2018 1151   GFRAA 96 12/10/2018 1151   Lab Results  Component Value Date   HGBA1C 4.9 12/10/2018   Lab Results  Component Value Date   INSULIN 16.6 12/10/2018    CBC    Component Value Date/Time   WBC 4.9 12/10/2018 1151   WBC 5.1 12/25/2014 1348   WBC 4.0 08/14/2014 0933   RBC 4.27 12/10/2018 1151   RBC 4.03 12/25/2014 1348   RBC 4.32 08/14/2014 0933   HGB 12.4 12/10/2018  1151   HGB 11.7 12/25/2014 1348   HCT 37.3 12/10/2018 1151   HCT 35.9 12/25/2014 1348   PLT 175 12/25/2014 1348   MCV 87 12/10/2018 1151   MCV 89.1 12/25/2014 1348   MCH 29.0 12/10/2018 1151   MCH 29.0 12/25/2014 1348   MCH 27.1 02/09/2012 0900   MCHC 33.2 12/10/2018 1151   MCHC 32.6 12/25/2014 1348   MCHC 33.1 08/14/2014 0933   RDW 12.2 12/10/2018 1151   RDW 12.6 12/25/2014 1348   LYMPHSABS 1.3 12/10/2018 1151   LYMPHSABS 1.6 12/25/2014 1348   MONOABS 0.3 12/25/2014 1348   EOSABS 0.1 12/10/2018 1151   BASOSABS 0.1 12/10/2018 1151   BASOSABS 0.0 12/25/2014 1348   Iron/TIBC/Ferritin/ %Sat No results found for: IRON, TIBC, FERRITIN, IRONPCTSAT Lipid Panel     Component Value Date/Time   CHOL 202 (H) 12/10/2018 1151   TRIG 61 12/10/2018 1151   HDL 57 12/10/2018 1151   CHOLHDL 3 08/14/2014 0933   VLDL 9.6 08/14/2014 0933   LDLCALC 133 (H) 12/10/2018 1151   Hepatic Function Panel     Component Value Date/Time   PROT 7.2 12/10/2018 1151   PROT 6.9 12/25/2014 1349   ALBUMIN 4.5 12/10/2018 1151   ALBUMIN 3.7 12/25/2014 1349   AST 18 12/10/2018 1151   AST 19 12/25/2014 1349   ALT 16 12/10/2018 1151   ALT 19 12/25/2014 1349   ALKPHOS 117 12/10/2018 1151   ALKPHOS 116 12/25/2014 1349   BILITOT 0.3 12/10/2018 1151   BILITOT 0.28 12/25/2014 1349   BILIDIR 0.1 08/14/2014 0933      Component Value Date/Time   TSH 4.760 (H) 12/10/2018 1151   TSH 3.50 08/14/2014 0933   TSH 3.14 09/20/2011 1558     Ref. Range 12/10/2018 11:51  Vitamin D, 25-Hydroxy Latest Ref Range: 30.0 - 100.0 ng/mL 43.1     OBESITY BEHAVIORAL INTERVENTION VISIT  Today's visit was # 2   Starting weight: 201 lbs Starting date: 12/10/2018 Today's weight : 196 lbs Today's date:  12/24/2018 Total lbs lost to date: 5   ASK: We discussed the diagnosis of obesity with Alisha Chen today and Alisha Chen agreed to give [place] permission to discuss obesity behavioral modification therapy today.  ASSESS: Alisha Chen has the diagnosis of obesity and her BMI today is 31.65 Alisha Chen is in the action stage of change   ADVISE: Alisha Chen was educated on the multiple health risks of obesity as well as the benefit of weight loss to improve her health. She was advised of the need for long term treatment and the importance of lifestyle modifications to improve her current health and to decrease her risk of future health problems.  AGREE: Multiple dietary modification options and treatment options were discussed and  Alisha Chen agreed to follow the recommendations documented in the above note.  ARRANGE: Alisha Chen was educated on the importance of frequent visits to treat obesity as outlined per CMS and USPSTF guidelines and agreed to schedule her next follow up appointment today.  Corey Skains, am acting as Location manager for [employer]. Owens Shark, DO  I have reviewed the above documentation for accuracy and completeness, and I agree with the above. -Jearld Lesch, DO

## 2018-12-26 NOTE — Telephone Encounter (Signed)
Pt is aware that PCP is out of office and message sent for when she returns.

## 2018-12-27 ENCOUNTER — Encounter (INDEPENDENT_AMBULATORY_CARE_PROVIDER_SITE_OTHER): Payer: Self-pay | Admitting: Bariatrics

## 2019-01-01 NOTE — Telephone Encounter (Signed)
This was meant for Dr Leafy Ro

## 2019-01-07 ENCOUNTER — Ambulatory Visit (INDEPENDENT_AMBULATORY_CARE_PROVIDER_SITE_OTHER): Payer: BC Managed Care – PPO | Admitting: Bariatrics

## 2019-01-07 ENCOUNTER — Encounter (INDEPENDENT_AMBULATORY_CARE_PROVIDER_SITE_OTHER): Payer: Self-pay | Admitting: Bariatrics

## 2019-01-07 VITALS — BP 104/72 | HR 84 | Temp 97.8°F | Ht 66.0 in | Wt 196.0 lb

## 2019-01-07 DIAGNOSIS — E8881 Metabolic syndrome: Secondary | ICD-10-CM

## 2019-01-07 DIAGNOSIS — Z9189 Other specified personal risk factors, not elsewhere classified: Secondary | ICD-10-CM

## 2019-01-07 DIAGNOSIS — E559 Vitamin D deficiency, unspecified: Secondary | ICD-10-CM

## 2019-01-07 DIAGNOSIS — Z6831 Body mass index (BMI) 31.0-31.9, adult: Secondary | ICD-10-CM | POA: Diagnosis not present

## 2019-01-07 DIAGNOSIS — E669 Obesity, unspecified: Secondary | ICD-10-CM | POA: Diagnosis not present

## 2019-01-08 NOTE — Progress Notes (Signed)
Office: [phone]  /  Fax: [phone]   HPI:   Chief Complaint: OBESITY Alisha Chen is here to discuss her progress with her obesity treatment plan. She is on the Category 2 plan and is following her eating plan approximately 60 to 70 % of the time. She states she is exercising 0 minutes 0 times per week. Alisha Chen is doing well overall, but she did not lose weight since her last visit. She struggles with continuity and staying on track with her diet. Her weight is 196 lb (88.9 kg) today and she has maintained weight over a period of 2 weeks since her last visit. She has lost 5 lbs since starting treatment with [place].  Insulin Resistance Alisha Chen has a diagnosis of insulin resistance based on her elevated fasting insulin level >5. Although Alisha Chen's blood glucose readings are still under good control, insulin resistance puts her at greater risk of metabolic syndrome and diabetes. Her last A1c was at 4.9 and last insulin level was at 16.6 She is not taking medications currently and continues to work on diet and exercise to decrease risk of diabetes.  Vitamin D deficiency Alisha Chen has a diagnosis of vitamin D deficiency. Her last vitamin D level was at 43.1 She is currently taking OTC vit D and denies nausea, vomiting or muscle weakness.  At risk for osteopenia and osteoporosis Alisha Chen is at higher risk of osteopenia and osteoporosis due to vitamin D deficiency.   ASSESSMENT AND PLAN:  Insulin resistance  Vitamin D deficiency  At risk for osteoporosis  Class 1 obesity with serious comorbidity and body mass index (BMI) of 31.0 to 31.9 in adult, unspecified obesity type  PLAN:  Insulin Resistance Alisha Chen will continue to work on weight loss, exercise, increasing lean protein and decreasing simple carbohydrates in her diet to help decrease the risk of diabetes. She was informed that eating too many simple carbohydrates or too many calories at one sitting increases the likelihood of GI side effects.  Alisha Chen  agreed to follow up with [place] as directed to monitor her progress.  Vitamin D Deficiency Alisha Chen was informed that low vitamin D levels contributes to fatigue and are associated with obesity, breast, and colon Chen. She will continue to take OTC Vit D3 @2 ,000 IU daily and will follow up for routine testing of vitamin D, at least 2-3 times per year. She was informed of the risk of over-replacement of vitamin D and agrees to not increase her dose unless she discusses this with [place] first.  At risk for osteopenia and osteoporosis Alisha Chen was given extended  (15 minutes) osteoporosis prevention counseling today. Alisha Chen is at risk for osteopenia and osteoporosis due to her vitamin D deficiency. She was encouraged to take her vitamin D and follow her higher calcium diet and increase strengthening exercise to help strengthen her bones and decrease her risk of osteopenia and osteoporosis.  Obesity Alisha Chen is currently in the action stage of change. As such, her goal is to continue with weight loss efforts She has agreed to follow the Category 2 plan Alisha Chen has been instructed to work up to a goal of 150 minutes of combined cardio and strengthening exercise per week or walking (Elliptical) for 5 to 10 minutes for weight loss and overall health benefits. We discussed the following Behavioral Modification Strategies today: increase H2O intake, no skipping meals, better snacking choices, increasing lean protein intake, decreasing simple carbohydrates, increasing vegetables, decrease eating out and work on meal planning and easy cooking plans Handout for eating  out was provided to patient today. Additional Category 1 and 2 breakfast options were provided to patient today along with additional lunch options.  Alisha Chen has agreed to follow up with our clinic in 2 weeks. She was informed of the importance of frequent follow up visits to maximize her success with intensive lifestyle modifications for her multiple health  conditions.  ALLERGIES: Allergies  Allergen Reactions  . Penicillins Other (See Comments)    Not sure, but was told by mother never to take this medication as a child  . Shellfish-Derived Products Other (See Comments)    Welts  . Clarithromycin Other (See Comments)    REACTION: NAUSEA,WEAK,BITTER TASTE  . Ondansetron Nausea And Vomiting    Headaches    MEDICATIONS: Current Outpatient Medications on File Prior to Visit  Medication Sig Dispense Refill  . celecoxib (CELEBREX) 100 MG capsule Take 100 mg by mouth 2 (two) times daily.    . cholecalciferol (VITAMIN D) 1000 UNITS tablet Take 1,000 Units by mouth daily.    Marland Kitchen ibuprofen (ADVIL,MOTRIN) 800 MG tablet Take 1 tablet (800 mg total) by mouth 3 (three) times daily. 21 tablet 0  . Misc Natural Products (COLON CLEANSE PO) Take by mouth 3 (three) times daily. 3 pills nightly qhs    . vitamin B-12 (CYANOCOBALAMIN) 1000 MCG tablet Take 1,000 mcg by mouth daily.     No current facility-administered medications on file prior to visit.     PAST MEDICAL HISTORY: Past Medical History:  Diagnosis Date  . Anemia   . Back pain   . 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 History of chemotherapy 03/23/12- 05/10/12   s/p 4 cycles of FEC   . Hx: UTI (urinary tract infection)   . Knee pain   . Lactose intolerance   . Neck pain   . S/P radiation therapy 07/10/12-08/23/12   Left Breast: 50 Gy/25 Fractions; Boost: 10 Gy/5 Fractions  . Status post chemotherapy 1 dose 05/23/12   Stopped after one cycle of Taxol due to Grade 2 Toxicity  . Status post chemotherapy 1 dose 05/23/12   Stopped after one cycle of Taxol due to Grade 2 Toxicity    PAST SURGICAL HISTORY: Past Surgical History:  Procedure Laterality Date  . AXILLARY SURGERY  03/06/12   left, regional resection, 1/3 nodes pos  . BACK SURGERY     LUMBAR DECOMPRESSION  . BREAST BIOPSY  01/09/2012   left breast 3 0'clock/ER?PR =positive,her 2 neg  .  BREAST LUMPECTOMY Left 02/10/12   Left Breast: 2 Foci of Invasive Ductal Caand High grade Ductal Carcinoma In Situ, 0/14 nodes Left Axilla Negative : Regional Resection of Lymph Nodes: 0/5 Nodes Negative  . FOOT SURGERY     multiple  . INTRAUTERINE DEVICE REMOVAL  01/24/12  . needle core Biospy  01/09/12   Left Breast: Invasive Ductal Carcinoma, Lymph Node Axilla: Metastatic Mammary Carcinoma  . PORTACATH PLACEMENT  02/10/2012   Procedure: INSERTION PORT-A-CATH;  Surgeon: Rolm Bookbinder, MD;  Location: [facility];  Service: General;  Laterality: Right;  . Regional Resection  03/06/12   Left Axilla: 1/3 Nodes Metastatic Carcinoma  . SPINE SURGERY  01/2011    SOCIAL HISTORY: Social History   Tobacco Use  . Smoking status: Never Smoker  . Smokeless tobacco: Never Used  Substance Use Topics  . Alcohol use: No  . Drug use: No    FAMILY HISTORY: Family History  Problem Relation Age  of Onset  . Diabetes Mother   . Sudden death Mother   . Schizophrenia Mother   . Sleep apnea Mother   . Obesity Mother   . Seizures Son     ROS: Review of Systems  Constitutional: Negative for weight loss.  Gastrointestinal: Negative for nausea and vomiting.  Musculoskeletal:       Negative for muscle weakness    PHYSICAL EXAM: Blood pressure 104/72, pulse 84, temperature 97.8 F (36.6 C), temperature source Oral, height 5\' 6"  (1.676 m), weight 196 lb (88.9 kg), last menstrual period 02/27/2012, SpO2 98 %. Body mass index is 31.64 kg/m. Physical Exam Vitals signs reviewed.  Constitutional:      Appearance: Normal appearance. She is well-developed. She is obese.  Cardiovascular:     Rate and Rhythm: Normal rate.  Pulmonary:     Effort: Pulmonary effort is normal.  Musculoskeletal: Normal range of motion.  Skin:    General: Skin is warm and dry.  Neurological:     Mental Status: She is alert and oriented to person, place, and time.  Psychiatric:        Mood and Affect: Mood  normal.        Behavior: Behavior normal.     RECENT LABS AND TESTS: BMET    Component Value Date/Time   NA 139 12/10/2018 1151   NA 140 12/25/2014 1349   K 4.6 12/10/2018 1151   K 4.4 12/25/2014 1349   CL 100 12/10/2018 1151   CL 105 12/21/2012 1527   CO2 23 12/10/2018 1151   CO2 27 12/25/2014 1349   GLUCOSE 71 12/10/2018 1151   GLUCOSE 71 12/25/2014 1349   GLUCOSE 75 12/21/2012 1527   BUN 15 12/10/2018 1151   BUN 22.5 12/25/2014 1349   CREATININE 0.81 12/10/2018 1151   CREATININE 0.9 12/25/2014 1349   CALCIUM 10.5 (H) 12/10/2018 1151   CALCIUM 10.2 12/25/2014 1349   GFRNONAA 83 12/10/2018 1151   GFRAA 96 12/10/2018 1151   Lab Results  Component Value Date   HGBA1C 4.9 12/10/2018   Lab Results  Component Value Date   INSULIN 16.6 12/10/2018   CBC    Component Value Date/Time   WBC 4.9 12/10/2018 1151   WBC 5.1 12/25/2014 1348   WBC 4.0 08/14/2014 0933   RBC 4.27 12/10/2018 1151   RBC 4.03 12/25/2014 1348   RBC 4.32 08/14/2014 0933   HGB 12.4 12/10/2018 1151   HGB 11.7 12/25/2014 1348   HCT 37.3 12/10/2018 1151   HCT 35.9 12/25/2014 1348   PLT 175 12/25/2014 1348   MCV 87 12/10/2018 1151   MCV 89.1 12/25/2014 1348   MCH 29.0 12/10/2018 1151   MCH 29.0 12/25/2014 1348   MCH 27.1 02/09/2012 0900   MCHC 33.2 12/10/2018 1151   MCHC 32.6 12/25/2014 1348   MCHC 33.1 08/14/2014 0933   RDW 12.2 12/10/2018 1151   RDW 12.6 12/25/2014 1348   LYMPHSABS 1.3 12/10/2018 1151   LYMPHSABS 1.6 12/25/2014 1348   MONOABS 0.3 12/25/2014 1348   EOSABS 0.1 12/10/2018 1151   BASOSABS 0.1 12/10/2018 1151   BASOSABS 0.0 12/25/2014 1348   Iron/TIBC/Ferritin/ %Sat No results found for: IRON, TIBC, FERRITIN, IRONPCTSAT Lipid Panel     Component Value Date/Time   CHOL 202 (H) 12/10/2018 1151   TRIG 61 12/10/2018 1151   HDL 57 12/10/2018 1151   CHOLHDL 3 08/14/2014 0933   VLDL 9.6 08/14/2014 0933   LDLCALC 133 (H) 12/10/2018 1151   Hepatic Function  Panel       Component Value Date/Time   PROT 7.2 12/10/2018 1151   PROT 6.9 12/25/2014 1349   ALBUMIN 4.5 12/10/2018 1151   ALBUMIN 3.7 12/25/2014 1349   AST 18 12/10/2018 1151   AST 19 12/25/2014 1349   ALT 16 12/10/2018 1151   ALT 19 12/25/2014 1349   ALKPHOS 117 12/10/2018 1151   ALKPHOS 116 12/25/2014 1349   BILITOT 0.3 12/10/2018 1151   BILITOT 0.28 12/25/2014 1349   BILIDIR 0.1 08/14/2014 0933      Component Value Date/Time   TSH 4.760 (H) 12/10/2018 1151   TSH 3.50 08/14/2014 0933   TSH 3.14 09/20/2011 1558     Ref. Range 12/10/2018 11:51  Vitamin D, 25-Hydroxy Latest Ref Range: 30.0 - 100.0 ng/mL 43.1    OBESITY BEHAVIORAL INTERVENTION VISIT  Today's visit was # 3   Starting weight: 201 lbs Starting date: 12/10/2018 Today's weight : 196 lbs Today's date: 01/07/2019 Total lbs lost to date: 5   ASK: We discussed the diagnosis of obesity with Alisha Chen today and Alisha Chen agreed to give [place] permission to discuss obesity behavioral modification therapy today.  ASSESS: Alisha Chen has the diagnosis of obesity and her BMI today is 31.65 Alisha Chen is in the action stage of change   ADVISE: Cecelia was educated on the multiple health risks of obesity as well as the benefit of weight loss to improve her health. She was advised of the need for long term treatment and the importance of lifestyle modifications to improve her current health and to decrease her risk of future health problems.  AGREE: Multiple dietary modification options and treatment options were discussed and  Alisha Chen agreed to follow the recommendations documented in the above note.  ARRANGE: Alisha Chen was educated on the importance of frequent visits to treat obesity as outlined per CMS and USPSTF guidelines and agreed to schedule her next follow up appointment today.  Corey Skains, am acting as Location manager for [employer]. Owens Shark, DO  I have reviewed the above documentation for accuracy and completeness, and I agree with  the above. -Jearld Lesch, DO

## 2019-01-23 ENCOUNTER — Ambulatory Visit (INDEPENDENT_AMBULATORY_CARE_PROVIDER_SITE_OTHER): Payer: BC Managed Care – PPO | Admitting: Family Medicine

## 2019-01-23 VITALS — BP 115/59 | HR 66 | Temp 97.6°F | Ht 66.0 in | Wt 195.0 lb

## 2019-01-23 DIAGNOSIS — Z6831 Body mass index (BMI) 31.0-31.9, adult: Secondary | ICD-10-CM

## 2019-01-23 DIAGNOSIS — E8881 Metabolic syndrome: Secondary | ICD-10-CM | POA: Diagnosis not present

## 2019-01-23 DIAGNOSIS — E669 Obesity, unspecified: Secondary | ICD-10-CM

## 2019-01-23 NOTE — Progress Notes (Signed)
Office: [phone]  /  Fax: [phone]   HPI:   Chief Complaint: OBESITY Alisha Chen is here to discuss her progress with her obesity treatment plan. She is on the Category 2 plan and is following her eating plan approximately 50% of the time. She states she is exercising on the elliptical 40 minutes 2 times per week. Alisha Chen reports she is eating all of the food plus extra snack calories. She states she has been eating out a few times per week and also states she is struggling with hunger between 8 and 10 p.m. Her weight is 195 lb (88.5 kg) today and has had a weight loss of 1 pound over a period of 2 weeks since her last visit. She has lost 6 lbs since starting treatment with [place].  Insulin Resistance Alisha Chen has a diagnosis of insulin resistance based on her elevated fasting insulin level >5. Although Alisha Chen's blood glucose readings are still under good control, insulin resistance puts her at greater risk of metabolic syndrome and diabetes. She is not taking metformin currently and continues to work on diet and exercise to decrease risk of diabetes. Alisha Chen reports positive polyphagia at night between 8 and 10 p.m. We discussed metformin. Lab Results  Component Value Date   HGBA1C 4.9 12/10/2018     ASSESSMENT AND PLAN:  Insulin resistance  Class 1 obesity with serious comorbidity and body mass index (BMI) of 31.0 to 31.9 in adult, unspecified obesity type  PLAN:  Insulin Resistance Alisha Chen will continue to work on weight loss, exercise, and decreasing simple carbohydrates in her diet to help decrease the risk of diabetes. We dicussed metformin including benefits and risks. She was informed that eating too many simple carbohydrates or too many calories at one sitting increases the likelihood of GI side effects. Alisha Chen declined metformin at this time. She was advised to move the food around on her plan to have a nighttime snack. Alisha Chen agreed to follow-up with [place] as directed to monitor her  progress.  I spent > than 50% of the 15 minute visit on counseling as documented in the note.  Obesity Alisha Chen is currently in the action stage of change. As such, her goal is to continue with weight loss efforts. She has agreed to follow the Category 2 plan with lunch options. We also discussed protein exchanges for 2 ounces of protein. Alisha Chen has been instructed to decrease the duration of her exercise to 30 minutes to avoid excess hunger. We discussed the following Behavioral Modification Strategies today: ways to avoid night time snacking, better snacking choices, and planning for success.  Alisha Chen has agreed to follow-up with our clinic in 4 weeks with [place] and will see our Registered Dietitian in 2 weeks. She was informed of the importance of frequent follow up visits to maximize her success with intensive lifestyle modifications for her multiple health conditions.  ALLERGIES: Allergies  Allergen Reactions   Penicillins Other (See Comments)    Not sure, but was told by mother never to take this medication as a child   Shellfish-Derived Products Other (See Comments)    Welts   Clarithromycin Other (See Comments)    REACTION: NAUSEA,WEAK,BITTER TASTE   Ondansetron Nausea And Vomiting    Headaches    MEDICATIONS: Current Outpatient Medications on File Prior to Visit  Medication Sig Dispense Refill   celecoxib (CELEBREX) 100 MG capsule Take 100 mg by mouth 2 (two) times daily.     cholecalciferol (VITAMIN D) 1000 UNITS tablet Take 1,000 Units  by mouth daily.     ibuprofen (ADVIL,MOTRIN) 800 MG tablet Take 1 tablet (800 mg total) by mouth 3 (three) times daily. 21 tablet 0   Misc Natural Products (COLON CLEANSE PO) Take by mouth 3 (three) times daily. 3 pills nightly qhs     vitamin B-12 (CYANOCOBALAMIN) 1000 MCG tablet Take 1,000 mcg by mouth daily.     No current facility-administered medications on file prior to visit.     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,dcis   History of chemotherapy 03/23/12- 05/10/12   s/p 4 cycles of FEC    Hx: UTI (urinary tract infection)    Knee pain    Lactose intolerance    Neck pain    S/P radiation therapy 07/10/12-08/23/12   Left Breast: 50 Gy/25 Fractions; Boost: 10 Gy/5 Fractions   Status post chemotherapy 1 dose 05/23/12   Stopped after one cycle of Taxol due to Grade 2 Toxicity   Status post chemotherapy 1 dose 05/23/12   Stopped after one cycle of Taxol due to Grade 2 Toxicity    PAST SURGICAL HISTORY: Past Surgical History:  Procedure Laterality Date   AXILLARY SURGERY  03/06/12   left, regional resection, 1/3 nodes pos   BACK SURGERY     LUMBAR DECOMPRESSION   BREAST BIOPSY  01/09/2012   left breast 3 0'clock/ER?PR =positive,her 2 neg   BREAST LUMPECTOMY Left 02/10/12   Left Breast: 2 Foci of Invasive Ductal Caand High grade Ductal Carcinoma In Situ, 0/14 nodes Left Axilla Negative : Regional Resection of Lymph Nodes: 0/5 Nodes Negative   FOOT SURGERY     multiple   INTRAUTERINE DEVICE REMOVAL  01/24/12   needle core Biospy  01/09/12   Left Breast: Invasive Ductal Carcinoma, Lymph Node Axilla: Metastatic Mammary Carcinoma   PORTACATH PLACEMENT  02/10/2012   Procedure: INSERTION PORT-A-CATH;  Surgeon: Rolm Bookbinder, MD;  Location: [facility];  Service: General;  Laterality: Right;   Regional Resection  03/06/12   Left Axilla: 1/3 Nodes Metastatic Carcinoma   SPINE SURGERY  01/2011    SOCIAL HISTORY: Social History   Tobacco Use   Smoking status: Never Smoker   Smokeless tobacco: Never Used  Substance Use Topics   Alcohol use: No   Drug use: No    FAMILY HISTORY: Family History  Problem Relation Age of Onset   Diabetes Mother    Sudden death Mother    Schizophrenia Mother    Sleep apnea Mother    Obesity Mother    Seizures Son     ROS: Review of Systems  Constitutional: Positive for weight loss.  Endo/Heme/Allergies:       Positive for polyphagia at night. Negative for hypoglycemia.   PHYSICAL EXAM: Blood pressure (!) 115/59, pulse 66, temperature 97.6 F (36.4 C), temperature source Oral, height 5\' 6"  (1.676 m), weight 195 lb (88.5 kg), last menstrual period 02/27/2012, SpO2 96 %. Body mass index is 31.47 kg/m. Physical Exam Vitals signs reviewed.  Constitutional:      Appearance: Normal appearance. She is obese.  Cardiovascular:     Rate and Rhythm: Normal rate.     Pulses: Normal pulses.  Pulmonary:     Effort: Pulmonary effort is normal.     Breath sounds: Normal breath sounds.  Musculoskeletal: Normal range of motion.  Skin:  General: Skin is warm and dry.  Neurological:     Mental Status: She is alert and oriented to person, place, and time.  Psychiatric:        Behavior: Behavior normal.   RECENT LABS AND TESTS: BMET    Component Value Date/Time   NA 139 12/10/2018 1151   NA 140 12/25/2014 1349   K 4.6 12/10/2018 1151   K 4.4 12/25/2014 1349   CL 100 12/10/2018 1151   CL 105 12/21/2012 1527   CO2 23 12/10/2018 1151   CO2 27 12/25/2014 1349   GLUCOSE 71 12/10/2018 1151   GLUCOSE 71 12/25/2014 1349   GLUCOSE 75 12/21/2012 1527   BUN 15 12/10/2018 1151   BUN 22.5 12/25/2014 1349   CREATININE 0.81 12/10/2018 1151   CREATININE 0.9 12/25/2014 1349   CALCIUM 10.5 (H) 12/10/2018 1151   CALCIUM 10.2 12/25/2014 1349   GFRNONAA 83 12/10/2018 1151   GFRAA 96 12/10/2018 1151   Lab Results  Component Value Date   HGBA1C 4.9 12/10/2018   Lab Results  Component Value Date   INSULIN 16.6 12/10/2018   CBC    Component Value Date/Time   WBC 4.9 12/10/2018 1151   WBC 5.1 12/25/2014 1348   WBC 4.0 08/14/2014 0933   RBC 4.27 12/10/2018 1151   RBC 4.03 12/25/2014 1348   RBC 4.32 08/14/2014 0933   HGB 12.4 12/10/2018 1151   HGB 11.7 12/25/2014 1348   HCT 37.3 12/10/2018 1151    HCT 35.9 12/25/2014 1348   PLT 175 12/25/2014 1348   MCV 87 12/10/2018 1151   MCV 89.1 12/25/2014 1348   MCH 29.0 12/10/2018 1151   MCH 29.0 12/25/2014 1348   MCH 27.1 02/09/2012 0900   MCHC 33.2 12/10/2018 1151   MCHC 32.6 12/25/2014 1348   MCHC 33.1 08/14/2014 0933   RDW 12.2 12/10/2018 1151   RDW 12.6 12/25/2014 1348   LYMPHSABS 1.3 12/10/2018 1151   LYMPHSABS 1.6 12/25/2014 1348   MONOABS 0.3 12/25/2014 1348   EOSABS 0.1 12/10/2018 1151   BASOSABS 0.1 12/10/2018 1151   BASOSABS 0.0 12/25/2014 1348   Iron/TIBC/Ferritin/ %Sat No results found for: IRON, TIBC, FERRITIN, IRONPCTSAT Lipid Panel     Component Value Date/Time   CHOL 202 (H) 12/10/2018 1151   TRIG 61 12/10/2018 1151   HDL 57 12/10/2018 1151   CHOLHDL 3 08/14/2014 0933   VLDL 9.6 08/14/2014 0933   LDLCALC 133 (H) 12/10/2018 1151   Hepatic Function Panel     Component Value Date/Time   PROT 7.2 12/10/2018 1151   PROT 6.9 12/25/2014 1349   ALBUMIN 4.5 12/10/2018 1151   ALBUMIN 3.7 12/25/2014 1349   AST 18 12/10/2018 1151   AST 19 12/25/2014 1349   ALT 16 12/10/2018 1151   ALT 19 12/25/2014 1349   ALKPHOS 117 12/10/2018 1151   ALKPHOS 116 12/25/2014 1349   BILITOT 0.3 12/10/2018 1151   BILITOT 0.28 12/25/2014 1349   BILIDIR 0.1 08/14/2014 0933      Component Value Date/Time   TSH 4.760 (H) 12/10/2018 1151   TSH 3.50 08/14/2014 0933   TSH 3.14 09/20/2011 1558    Ref. Range 12/10/2018 11:51  Vitamin D, 25-Hydroxy Latest Ref Range: 30.0 - 100.0 ng/mL 43.1   OBESITY BEHAVIORAL INTERVENTION VISIT  Today's visit was #4  Starting weight: 201 lbs Starting date: 12/10/2018 Today's weight: 195 lbs Today's date: 01/23/2019 Total lbs lost to date: 6    01/23/2019  Height 5\' 6"  (1.676 m)  Weight 195 lb (88.5 kg)  BMI (Calculated) 31.49  BLOOD PRESSURE - SYSTOLIC 845  BLOOD PRESSURE - DIASTOLIC 59   Body Fat % 36 %  Total Body Water (lbs) 74 lbs   ASK: We discussed the diagnosis of obesity with Alisha Chen today and Alisha Chen agreed to give [place] permission to discuss obesity behavioral modification therapy today.  ASSESS: Alisha Chen has the diagnosis of obesity and her BMI today is 31.49. Alisha Chen is in the action stage of change.   ADVISE: Alisha Chen was educated on the multiple health risks of obesity as well as the benefit of weight loss to improve her health. She was advised of the need for long term treatment and the importance of lifestyle modifications to improve her current health and to decrease her risk of future health problems.  AGREE: Multiple dietary modification options and treatment options were discussed and  Alisha Chen agreed to follow the recommendations documented in the above note.  ARRANGE: Alisha Chen was educated on the importance of frequent visits to treat obesity as outlined per CMS and USPSTF guidelines and agreed to schedule her next follow up appointment today.  IMichaelene Song, am acting as Location manager for [employer], FNP-C.  I have reviewed the above documentation for accuracy and completeness, and I agree with the above.  - Dawn Whitmire, FNP-C.

## 2019-01-24 ENCOUNTER — Encounter (INDEPENDENT_AMBULATORY_CARE_PROVIDER_SITE_OTHER): Payer: Self-pay | Admitting: Family Medicine

## 2019-02-07 ENCOUNTER — Ambulatory Visit (INDEPENDENT_AMBULATORY_CARE_PROVIDER_SITE_OTHER): Payer: BC Managed Care – PPO | Admitting: Dietician

## 2019-02-14 ENCOUNTER — Encounter (INDEPENDENT_AMBULATORY_CARE_PROVIDER_SITE_OTHER): Payer: Self-pay

## 2019-02-20 ENCOUNTER — Encounter (INDEPENDENT_AMBULATORY_CARE_PROVIDER_SITE_OTHER): Payer: Self-pay | Admitting: Family Medicine

## 2019-02-20 ENCOUNTER — Other Ambulatory Visit: Payer: Self-pay

## 2019-02-20 ENCOUNTER — Ambulatory Visit (INDEPENDENT_AMBULATORY_CARE_PROVIDER_SITE_OTHER): Payer: BC Managed Care – PPO | Admitting: Family Medicine

## 2019-02-20 DIAGNOSIS — Z6831 Body mass index (BMI) 31.0-31.9, adult: Secondary | ICD-10-CM

## 2019-02-20 DIAGNOSIS — E8881 Metabolic syndrome: Secondary | ICD-10-CM

## 2019-02-20 DIAGNOSIS — E669 Obesity, unspecified: Secondary | ICD-10-CM

## 2019-02-20 NOTE — Progress Notes (Signed)
Office: [phone]  /  Fax: [phone] TeleHealth Visit:  Alisha Chen has verbally consented to this TeleHealth visit today. The patient is located at home, the provider is located at the [employer] [facility] office. The participants in this visit include the listed provider and patient. The visit was conducted today via FaceTime.  HPI:   Chief Complaint: OBESITY Alisha Chen is here to discuss her progress with her obesity treatment plan. She is on the Category 2 plan with lunch options and is following her eating plan approximately 30% of the time. She states she is exercising on the elliptical 30 minutes 1 time per week. Alisha Chen has struggled to stick to the plan and states she is snacking too much. She weighed 199.4 lbs today at home. She has gained weight since being home more due to COVID 19. She reports eating food that her 54 year old is eating such as take-out fried chicken. We were unable to weigh the patient today for this TeleHealth visit. She feels as if she has gained weight since her last visit. She has lost 6 lbs since starting treatment with [place].  Insulin Resistance Alisha Chen has a diagnosis of insulin resistance based on her elevated fasting insulin level >5. Although Alisha Chen's blood glucose readings are still under good control, insulin resistance puts her at greater risk of metabolic syndrome and diabetes. She is not taking metformin and declined it after discussion. She continues to work on diet and exercise to decrease risk of diabetes. She does report polyphagia right after breakfast. Lab Results  Component Value Date   HGBA1C 4.9 12/10/2018     ASSESSMENT AND PLAN:  Insulin resistance  Class 1 obesity with serious comorbidity and body mass index (BMI) of 31.0 to 31.9 in adult, unspecified obesity type  PLAN:  Insulin Resistance Alisha Chen will continue to work on weight loss, exercise, and decreasing simple carbohydrates in her diet to help decrease the risk of  diabetes. We dicussed metformin including benefits and risks. Alisha Chen is not taking metformin and prescription was not written today. Alisha Chen agreed to follow-up with [place] as directed to monitor her progress.  I spent > than 50% of the 15 minute visit on counseling as documented in the note.  Obesity Alisha Chen is currently in the action stage of change. As such, her goal is to continue with weight loss efforts. She has agreed to follow the Category 2 plan with lunch options. Alisha Chen has been instructed to continue exercising using the elliptical 30 minutes 2-3 times per week. We discussed the following Behavioral Modification Strategies today: increasing lean protein intake, decrease eating out, work on meal planning and easy cooking plans, and planning for success.  Alisha Chen has agreed to follow-up with our clinic in 4 weeks. She was informed of the importance of frequent follow-up visits to maximize her success with intensive lifestyle modifications for her multiple health conditions.  ALLERGIES: Allergies  Allergen Reactions  . Penicillins Other (See Comments)    Not sure, but was told by mother never to take this medication as a child  . Shellfish-Derived Products Other (See Comments)    Welts  . Clarithromycin Other (See Comments)    REACTION: NAUSEA,WEAK,BITTER TASTE  . Ondansetron Nausea And Vomiting    Headaches    MEDICATIONS: Current Outpatient Medications on File Prior to Visit  Medication Sig Dispense Refill  . celecoxib (CELEBREX) 100 MG capsule Take 100 mg by mouth 2 (two) times daily.    . cholecalciferol (VITAMIN D) 1000 UNITS tablet  Take 1,000 Units by mouth daily.    Marland Kitchen ibuprofen (ADVIL,MOTRIN) 800 MG tablet Take 1 tablet (800 mg total) by mouth 3 (three) times daily. 21 tablet 0  . Misc Natural Products (COLON CLEANSE PO) Take by mouth 3 (three) times daily. 3 pills nightly qhs    . vitamin B-12 (CYANOCOBALAMIN) 1000 MCG tablet Take 1,000 mcg by mouth daily.     No current  facility-administered medications on file prior to visit.     PAST MEDICAL HISTORY: Past Medical History:  Diagnosis Date  . Anemia   . Back pain   . 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 History of chemotherapy 03/23/12- 05/10/12   s/p 4 cycles of FEC   . Hx: UTI (urinary tract infection)   . Knee pain   . Lactose intolerance   . Neck pain   . S/P radiation therapy 07/10/12-08/23/12   Left Breast: 50 Gy/25 Fractions; Boost: 10 Gy/5 Fractions  . Status post chemotherapy 1 dose 05/23/12   Stopped after one cycle of Taxol due to Grade 2 Toxicity  . Status post chemotherapy 1 dose 05/23/12   Stopped after one cycle of Taxol due to Grade 2 Toxicity    PAST SURGICAL HISTORY: Past Surgical History:  Procedure Laterality Date  . AXILLARY SURGERY  03/06/12   left, regional resection, 1/3 nodes pos  . BACK SURGERY     LUMBAR DECOMPRESSION  . BREAST BIOPSY  01/09/2012   left breast 3 0'clock/ER?PR =positive,her 2 neg  . BREAST LUMPECTOMY Left 02/10/12   Left Breast: 2 Foci of Invasive Ductal Caand High grade Ductal Carcinoma In Situ, 0/14 nodes Left Axilla Negative : Regional Resection of Lymph Nodes: 0/5 Nodes Negative  . FOOT SURGERY     multiple  . INTRAUTERINE DEVICE REMOVAL  01/24/12  . needle core Biospy  01/09/12   Left Breast: Invasive Ductal Carcinoma, Lymph Node Axilla: Metastatic Mammary Carcinoma  . PORTACATH PLACEMENT  02/10/2012   Procedure: INSERTION PORT-A-CATH;  Surgeon: Rolm Bookbinder, MD;  Location: [facility];  Service: General;  Laterality: Right;  . Regional Resection  03/06/12   Left Axilla: 1/3 Nodes Metastatic Carcinoma  . SPINE SURGERY  01/2011    SOCIAL HISTORY: Social History   Tobacco Use  . Smoking status: Never Smoker  . Smokeless tobacco: Never Used  Substance Use Topics  . Alcohol use: No  . Drug use: No    FAMILY HISTORY: Family History  Problem Relation Age of Onset  . Diabetes Mother    . Sudden death Mother   . Schizophrenia Mother   . Sleep apnea Mother   . Obesity Mother   . Seizures Son    ROS: Review of Systems  Endo/Heme/Allergies:       Positive for polyphagia after breakfast.   PHYSICAL EXAM: Pt in no acute distress  RECENT LABS AND TESTS: BMET    Component Value Date/Time   NA 139 12/10/2018 1151   NA 140 12/25/2014 1349   K 4.6 12/10/2018 1151   K 4.4 12/25/2014 1349   CL 100 12/10/2018 1151   CL 105 12/21/2012 1527   CO2 23 12/10/2018 1151   CO2 27 12/25/2014 1349   GLUCOSE 71 12/10/2018 1151   GLUCOSE 71 12/25/2014 1349   GLUCOSE 75 12/21/2012 1527   BUN 15 12/10/2018 1151   BUN 22.5 12/25/2014 1349   CREATININE 0.81 12/10/2018 1151   CREATININE 0.9 12/25/2014 1349  CALCIUM 10.5 (H) 12/10/2018 1151   CALCIUM 10.2 12/25/2014 1349   GFRNONAA 83 12/10/2018 1151   GFRAA 96 12/10/2018 1151   Lab Results  Component Value Date   HGBA1C 4.9 12/10/2018   Lab Results  Component Value Date   INSULIN 16.6 12/10/2018   CBC    Component Value Date/Time   WBC 4.9 12/10/2018 1151   WBC 5.1 12/25/2014 1348   WBC 4.0 08/14/2014 0933   RBC 4.27 12/10/2018 1151   RBC 4.03 12/25/2014 1348   RBC 4.32 08/14/2014 0933   HGB 12.4 12/10/2018 1151   HGB 11.7 12/25/2014 1348   HCT 37.3 12/10/2018 1151   HCT 35.9 12/25/2014 1348   PLT 175 12/25/2014 1348   MCV 87 12/10/2018 1151   MCV 89.1 12/25/2014 1348   MCH 29.0 12/10/2018 1151   MCH 29.0 12/25/2014 1348   MCH 27.1 02/09/2012 0900   MCHC 33.2 12/10/2018 1151   MCHC 32.6 12/25/2014 1348   MCHC 33.1 08/14/2014 0933   RDW 12.2 12/10/2018 1151   RDW 12.6 12/25/2014 1348   LYMPHSABS 1.3 12/10/2018 1151   LYMPHSABS 1.6 12/25/2014 1348   MONOABS 0.3 12/25/2014 1348   EOSABS 0.1 12/10/2018 1151   BASOSABS 0.1 12/10/2018 1151   BASOSABS 0.0 12/25/2014 1348   Iron/TIBC/Ferritin/ %Sat No results found for: IRON, TIBC, FERRITIN, IRONPCTSAT Lipid Panel     Component Value Date/Time   CHOL  202 (H) 12/10/2018 1151   TRIG 61 12/10/2018 1151   HDL 57 12/10/2018 1151   CHOLHDL 3 08/14/2014 0933   VLDL 9.6 08/14/2014 0933   LDLCALC 133 (H) 12/10/2018 1151   Hepatic Function Panel     Component Value Date/Time   PROT 7.2 12/10/2018 1151   PROT 6.9 12/25/2014 1349   ALBUMIN 4.5 12/10/2018 1151   ALBUMIN 3.7 12/25/2014 1349   AST 18 12/10/2018 1151   AST 19 12/25/2014 1349   ALT 16 12/10/2018 1151   ALT 19 12/25/2014 1349   ALKPHOS 117 12/10/2018 1151   ALKPHOS 116 12/25/2014 1349   BILITOT 0.3 12/10/2018 1151   BILITOT 0.28 12/25/2014 1349   BILIDIR 0.1 08/14/2014 0933      Component Value Date/Time   TSH 4.760 (H) 12/10/2018 1151   TSH 3.50 08/14/2014 0933   TSH 3.14 09/20/2011 1558   Results for ANYAH, SWALLOW (MRN [id number]) as of 02/20/2019 17:23  Ref. Range 12/10/2018 11:51  Vitamin D, 25-Hydroxy Latest Ref Range: 30.0 - 100.0 ng/mL 43.1    I, Michaelene Song, am acting as Location manager for [employer], FNP-C.  I have reviewed the above documentation for accuracy and completeness, and I agree with the above.  - Tiandra Swoveland, FNP-C.

## 2019-02-21 ENCOUNTER — Encounter (INDEPENDENT_AMBULATORY_CARE_PROVIDER_SITE_OTHER): Payer: Self-pay | Admitting: Family Medicine

## 2019-03-06 ENCOUNTER — Ambulatory Visit (INDEPENDENT_AMBULATORY_CARE_PROVIDER_SITE_OTHER): Payer: BC Managed Care – PPO | Admitting: Dietician

## 2019-03-06 ENCOUNTER — Ambulatory Visit (INDEPENDENT_AMBULATORY_CARE_PROVIDER_SITE_OTHER): Payer: BC Managed Care – PPO | Admitting: Bariatrics

## 2019-03-06 ENCOUNTER — Encounter (INDEPENDENT_AMBULATORY_CARE_PROVIDER_SITE_OTHER): Payer: Self-pay | Admitting: Bariatrics

## 2019-03-06 ENCOUNTER — Other Ambulatory Visit: Payer: Self-pay

## 2019-03-06 DIAGNOSIS — F3289 Other specified depressive episodes: Secondary | ICD-10-CM | POA: Diagnosis not present

## 2019-03-06 DIAGNOSIS — Z6831 Body mass index (BMI) 31.0-31.9, adult: Secondary | ICD-10-CM

## 2019-03-06 DIAGNOSIS — E8881 Metabolic syndrome: Secondary | ICD-10-CM

## 2019-03-06 DIAGNOSIS — R7989 Other specified abnormal findings of blood chemistry: Secondary | ICD-10-CM | POA: Diagnosis not present

## 2019-03-06 DIAGNOSIS — E669 Obesity, unspecified: Secondary | ICD-10-CM | POA: Diagnosis not present

## 2019-03-07 NOTE — Progress Notes (Signed)
Office: [phone]  /  Fax: [phone] TeleHealth Visit:  Alisha Chen has verbally consented to this TeleHealth visit today. The patient is located at home, the provider is located at the [employer] [facility] office. The participants in this visit include the listed provider and patient and any and all parties involved. The visit was conducted today via FaceTime.  HPI:   Chief Complaint: OBESITY Alisha Chen is here to discuss her progress with her obesity treatment plan. She is on the Category 2 plan and is following her eating plan approximately 40 to 50 % of the time. She states she is exercising on the elliptical for 10 to 30 minutes 3 times per week. Alisha Chen does not think that she has lost weight. She thinks that she has either maintained or gained weight. Alisha Chen is working from home. She has had some stress eating. Alisha Chen is doing better with the water. We were unable to weigh the patient today for this TeleHealth visit. She feels as if she has maintained or gained weight since her last visit.   Insulin Resistance Alisha Chen has a diagnosis of mild insulin resistance based on her elevated fasting insulin level >5. Although Alisha Chen's blood glucose readings are still under good control, insulin resistance puts her at greater risk of metabolic syndrome and diabetes. Her last A1c was at 4.9 and last insulin level was at 16.6 Her hunger is controlled for the most part. She is not taking medications currently and continues to work on diet and exercise to decrease risk of diabetes.  Elevated TSH  Alisha Chen has a diagnosis of elevated TSH. Her last TSH level was at 4.760. She is not on medications. Alisha Chen denies any signs or symptoms and she denies heat or cold intolerance.  Depression with emotional eating behaviors Alisha Chen is struggles with emotional eating and using food for comfort to the extent that it is negatively impacting her health. She often snacks when she is not hungry. Alisha Chen sometimes feels  she is out of control and then feels guilty that she made poor food choices. Alisha Chen is still hesitant about medications. She has been working on behavior modification techniques to help reduce her emotional eating and has been somewhat successful. She shows no sign of suicidal or homicidal ideations.  Depression screen [facility] 2/9 12/10/2018 04/30/2016  Decreased Interest 3 0  Down, Depressed, Hopeless 2 0  PHQ - 2 Score 5 0  Altered sleeping 2 -  Tired, decreased energy 2 -  Change in appetite 2 -  Feeling bad or failure about yourself  1 -  Trouble concentrating 2 -  Moving slowly or fidgety/restless 2 -  PHQ-9 Score 16 -  Difficult doing work/chores Not difficult at all -  Some recent data might be hidden    ASSESSMENT AND PLAN:  Insulin resistance  Elevated TSH  Other depression - with emotional eating  Class 1 obesity with serious comorbidity and body mass index (BMI) of 31.0 to 31.9 in adult, unspecified obesity type  PLAN:  Insulin Resistance Alisha Chen will continue to work on weight loss, exercise, increasing lean protein and decreasing simple carbohydrates in her diet to help decrease the risk of diabetes. She was informed that eating too many simple carbohydrates or too many calories at one sitting increases the likelihood of GI side effects. [name] agreed to follow up with [place] as directed to monitor her progress.  Elevated TSH  We will recheck thyroid panel in the near future and Alisha Chen will follow up as  directed.  Depression with Emotional Eating Behaviors We discussed behavior modification techniques today to help Alisha Chen deal with her emotional eating and depression. We discussed Wellbutrin in detail. She denies absolute and relative contraindications. Alisha Chen agreed to follow up as directed.  Obesity Alisha Chen is currently in the action stage of change. As such, her goal is to continue with weight loss efforts She has agreed to follow the Category 2 plan Alisha Chen will continue  exercises for weight loss and overall health benefits. We discussed the following Behavioral Modification Strategies today: increase H2O intake, no skipping meals, keeping healthy foods in the home, increasing lean protein intake, decreasing simple carbohydrates, increasing vegetables, decrease eating out and work on meal planning and easy cooking plans Alisha Chen will weigh herself at home before visits. She will space out her protein throughout the day.  Alisha Chen has agreed to follow up with our clinic in 2 weeks. She was informed of the importance of frequent follow up visits to maximize her success with intensive lifestyle modifications for her multiple health conditions.  ALLERGIES: Allergies  Allergen Reactions  . Penicillins Other (See Comments)    Not sure, but was told by mother never to take this medication as a child  . Shellfish-Derived Products Other (See Comments)    Welts  . Clarithromycin Other (See Comments)    REACTION: NAUSEA,WEAK,BITTER TASTE  . Ondansetron Nausea And Vomiting    Headaches    MEDICATIONS: Current Outpatient Medications on File Prior to Visit  Medication Sig Dispense Refill  . celecoxib (CELEBREX) 100 MG capsule Take 100 mg by mouth 2 (two) times daily.    . cholecalciferol (VITAMIN D) 1000 UNITS tablet Take 1,000 Units by mouth daily.    Marland Kitchen ibuprofen (ADVIL,MOTRIN) 800 MG tablet Take 1 tablet (800 mg total) by mouth 3 (three) times daily. 21 tablet 0  . Misc Natural Products (COLON CLEANSE PO) Take by mouth 3 (three) times daily. 3 pills nightly qhs    . vitamin B-12 (CYANOCOBALAMIN) 1000 MCG tablet Take 1,000 mcg by mouth daily.     No current facility-administered medications on file prior to visit.     PAST MEDICAL HISTORY: Past Medical History:  Diagnosis Date  . Anemia   . Back pain   . 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 History of chemotherapy 03/23/12- 05/10/12   s/p 4 cycles of FEC   . Hx: UTI  (urinary tract infection)   . Knee pain   . Lactose intolerance   . Neck pain   . S/P radiation therapy 07/10/12-08/23/12   Left Breast: 50 Gy/25 Fractions; Boost: 10 Gy/5 Fractions  . Status post chemotherapy 1 dose 05/23/12   Stopped after one cycle of Taxol due to Grade 2 Toxicity  . Status post chemotherapy 1 dose 05/23/12   Stopped after one cycle of Taxol due to Grade 2 Toxicity    PAST SURGICAL HISTORY: Past Surgical History:  Procedure Laterality Date  . AXILLARY SURGERY  03/06/12   left, regional resection, 1/3 nodes pos  . BACK SURGERY     LUMBAR DECOMPRESSION  . BREAST BIOPSY  01/09/2012   left breast 3 0'clock/ER?PR =positive,her 2 neg  . BREAST LUMPECTOMY Left 02/10/12   Left Breast: 2 Foci of Invasive Ductal Caand High grade Ductal Carcinoma In Situ, 0/14 nodes Left Axilla Negative : Regional Resection of Lymph Nodes: 0/5 Nodes Negative  . FOOT SURGERY     multiple  . INTRAUTERINE DEVICE REMOVAL  01/24/12  . needle core Biospy  01/09/12   Left Breast: Invasive Ductal Carcinoma, Lymph Node Axilla: Metastatic Mammary Carcinoma  . PORTACATH PLACEMENT  02/10/2012   Procedure: INSERTION PORT-A-CATH;  Surgeon: Rolm Bookbinder, MD;  Location: [place];  Service: General;  Laterality: Right;  . Regional Resection  03/06/12   Left Axilla: 1/3 Nodes Metastatic Carcinoma  . SPINE SURGERY  01/2011    SOCIAL HISTORY: Social History   Tobacco Use  . Smoking status: Never Smoker  . Smokeless tobacco: Never Used  Substance Use Topics  . Alcohol use: No  . Drug use: No    FAMILY HISTORY: Family History  Problem Relation Age of Onset  . Diabetes Mother   . Sudden death Mother   . Schizophrenia Mother   . Sleep apnea Mother   . Obesity Mother   . Seizures Son     ROS: Review of Systems  Constitutional: Negative for malaise/fatigue and weight loss.  Cardiovascular: Negative for palpitations.  Endo/Heme/Allergies:       Negative for heat or cold intolerance   Psychiatric/Behavioral: Positive for depression. Negative for suicidal ideas.    PHYSICAL EXAM: Pt in no acute distress  RECENT LABS AND TESTS: BMET    Component Value Date/Time   NA 139 12/10/2018 1151   NA 140 12/25/2014 1349   K 4.6 12/10/2018 1151   K 4.4 12/25/2014 1349   CL 100 12/10/2018 1151   CL 105 12/21/2012 1527   CO2 23 12/10/2018 1151   CO2 27 12/25/2014 1349   GLUCOSE 71 12/10/2018 1151   GLUCOSE 71 12/25/2014 1349   GLUCOSE 75 12/21/2012 1527   BUN 15 12/10/2018 1151   BUN 22.5 12/25/2014 1349   CREATININE 0.81 12/10/2018 1151   CREATININE 0.9 12/25/2014 1349   CALCIUM 10.5 (H) 12/10/2018 1151   CALCIUM 10.2 12/25/2014 1349   GFRNONAA 83 12/10/2018 1151   GFRAA 96 12/10/2018 1151   Lab Results  Component Value Date   HGBA1C 4.9 12/10/2018   Lab Results  Component Value Date   INSULIN 16.6 12/10/2018   CBC    Component Value Date/Time   WBC 4.9 12/10/2018 1151   WBC 5.1 12/25/2014 1348   WBC 4.0 08/14/2014 0933   RBC 4.27 12/10/2018 1151   RBC 4.03 12/25/2014 1348   RBC 4.32 08/14/2014 0933   HGB 12.4 12/10/2018 1151   HGB 11.7 12/25/2014 1348   HCT 37.3 12/10/2018 1151   HCT 35.9 12/25/2014 1348   PLT 175 12/25/2014 1348   MCV 87 12/10/2018 1151   MCV 89.1 12/25/2014 1348   MCH 29.0 12/10/2018 1151   MCH 29.0 12/25/2014 1348   MCH 27.1 02/09/2012 0900   MCHC 33.2 12/10/2018 1151   MCHC 32.6 12/25/2014 1348   MCHC 33.1 08/14/2014 0933   RDW 12.2 12/10/2018 1151   RDW 12.6 12/25/2014 1348   LYMPHSABS 1.3 12/10/2018 1151   LYMPHSABS 1.6 12/25/2014 1348   MONOABS 0.3 12/25/2014 1348   EOSABS 0.1 12/10/2018 1151   BASOSABS 0.1 12/10/2018 1151   BASOSABS 0.0 12/25/2014 1348   Iron/TIBC/Ferritin/ %Sat No results found for: IRON, TIBC, FERRITIN, IRONPCTSAT Lipid Panel     Component Value Date/Time   CHOL 202 (H) 12/10/2018 1151   TRIG 61 12/10/2018 1151   HDL 57 12/10/2018 1151   CHOLHDL 3 08/14/2014 0933   VLDL 9.6 08/14/2014  0933   LDLCALC 133 (H) 12/10/2018 1151   Hepatic Function Panel     Component Value  Date/Time   PROT 7.2 12/10/2018 1151   PROT 6.9 12/25/2014 1349   ALBUMIN 4.5 12/10/2018 1151   ALBUMIN 3.7 12/25/2014 1349   AST 18 12/10/2018 1151   AST 19 12/25/2014 1349   ALT 16 12/10/2018 1151   ALT 19 12/25/2014 1349   ALKPHOS 117 12/10/2018 1151   ALKPHOS 116 12/25/2014 1349   BILITOT 0.3 12/10/2018 1151   BILITOT 0.28 12/25/2014 1349   BILIDIR 0.1 08/14/2014 0933      Component Value Date/Time   TSH 4.760 (H) 12/10/2018 1151   TSH 3.50 08/14/2014 0933   TSH 3.14 09/20/2011 1558     Ref. Range 12/10/2018 11:51  Vitamin D, 25-Hydroxy Latest Ref Range: 30.0 - 100.0 ng/mL 43.1    I, Doreene Nest, am acting as Location manager for [employer]. Owens Shark, DO  I have reviewed the above documentation for accuracy and completeness, and I agree with the above. -Jearld Lesch, DO

## 2019-03-08 ENCOUNTER — Ambulatory Visit (INDEPENDENT_AMBULATORY_CARE_PROVIDER_SITE_OTHER): Payer: BC Managed Care – PPO | Admitting: Family Medicine

## 2019-03-08 ENCOUNTER — Other Ambulatory Visit: Payer: Self-pay

## 2019-03-08 ENCOUNTER — Encounter: Payer: Self-pay | Admitting: Family Medicine

## 2019-03-08 DIAGNOSIS — R42 Dizziness and giddiness: Secondary | ICD-10-CM

## 2019-03-08 DIAGNOSIS — R51 Headache: Secondary | ICD-10-CM | POA: Diagnosis not present

## 2019-03-08 DIAGNOSIS — R519 Headache, unspecified: Secondary | ICD-10-CM

## 2019-03-08 MED ORDER — TRAMADOL HCL 50 MG PO TABS: 50.0000 mg | ORAL_TABLET | Freq: Three times a day (TID) | ORAL | 0 refills | Status: AC | PRN

## 2019-03-08 NOTE — Telephone Encounter (Signed)
Appointment scheduled.

## 2019-03-08 NOTE — Progress Notes (Signed)
Virtual Visit via Video Note  I connected with Alisha Chen on 03/08/19 at  3:45 PM EDT by a video enabled telemedicine application and verified that I am speaking with the correct person using two identifiers.   I discussed the limitations of evaluation and management by telemedicine and the availability of in person appointments. The patient expressed understanding and agreed to proceed.  History of Present Illness: Pt is home c/o migraines lately and yesterday woke up between 3-5am and was very light headed / dizzy---pt has been taking tylenol and it helps pt does have an aura and is able to stop headache with tylenol but its occurring 2-3 x a week   Headache is over r eye--- always    She has no congestion , no fever  She has had some blurry vision but not now No n/v   Observations/Objective: Temp 98.6   rr normal    Unable to get any more vitals   Assessment and Plan:  1. Intractable headache, unspecified chronicity pattern, unspecified headache type Pt did not want a preventative at this time Due to worsening headaches and hx cancer will check MRI brain  And refer to neuro-- at her request - MR Brain Wo Contrast; Future - Ambulatory referral to Neurology - CBC with Differential/Platelet - Comprehensive metabolic panel - Sedimentation rate - traMADol (ULTRAM) 50 MG tablet; Take 1 tablet (50 mg total) by mouth every 8 (eight) hours as needed for up to 5 days.  Dispense: 15 tablet; Refill: 0  2. Dizziness Improved today Check labs  - CBC with Differential/Platelet - Comprehensive metabolic panel - Sedimentation rate Follow Up Instructions:    I discussed the assessment and treatment plan with the patient. The patient was provided an opportunity to ask questions and all were answered. The patient agreed with the plan and demonstrated an understanding of the instructions.   The patient was advised to call back or seek an in-person evaluation if the symptoms worsen or if the  condition fails to improve as anticipated.  I provided 25 minutes of non-face-to-face time during this encounter.   [name], DO

## 2019-03-11 ENCOUNTER — Encounter (INDEPENDENT_AMBULATORY_CARE_PROVIDER_SITE_OTHER): Payer: Self-pay | Admitting: Bariatrics

## 2019-03-11 NOTE — Telephone Encounter (Signed)
Please advise 

## 2019-03-13 NOTE — Telephone Encounter (Signed)
Please review

## 2019-03-19 NOTE — Progress Notes (Signed)
Patient declined virtual visit.  She wants to reschedule until after her MRI is performed.

## 2019-03-20 ENCOUNTER — Encounter (INDEPENDENT_AMBULATORY_CARE_PROVIDER_SITE_OTHER): Payer: Self-pay | Admitting: Family Medicine

## 2019-03-20 ENCOUNTER — Telehealth: Payer: BC Managed Care – PPO | Admitting: Neurology

## 2019-03-20 ENCOUNTER — Other Ambulatory Visit: Payer: Self-pay

## 2019-03-20 ENCOUNTER — Ambulatory Visit (INDEPENDENT_AMBULATORY_CARE_PROVIDER_SITE_OTHER): Payer: BC Managed Care – PPO | Admitting: Family Medicine

## 2019-03-20 ENCOUNTER — Telehealth: Payer: Self-pay

## 2019-03-20 DIAGNOSIS — Z6831 Body mass index (BMI) 31.0-31.9, adult: Secondary | ICD-10-CM | POA: Diagnosis not present

## 2019-03-20 DIAGNOSIS — E8881 Metabolic syndrome: Secondary | ICD-10-CM | POA: Diagnosis not present

## 2019-03-20 DIAGNOSIS — E669 Obesity, unspecified: Secondary | ICD-10-CM

## 2019-03-20 DIAGNOSIS — E559 Vitamin D deficiency, unspecified: Secondary | ICD-10-CM

## 2019-03-20 DIAGNOSIS — E88819 Insulin resistance, unspecified: Secondary | ICD-10-CM

## 2019-03-20 DIAGNOSIS — E66811 Obesity, class 1: Secondary | ICD-10-CM

## 2019-03-20 MED ORDER — METFORMIN HCL 500 MG PO TABS: 500.0000 mg | ORAL_TABLET | Freq: Every day | ORAL | 0 refills | Status: DC

## 2019-03-20 NOTE — Telephone Encounter (Signed)
Contacted Pt to confirm information prior to virtual visit. Pt has an MRI scheduled for July, she would like to wait until after the MRI for this visit.

## 2019-03-21 ENCOUNTER — Encounter (INDEPENDENT_AMBULATORY_CARE_PROVIDER_SITE_OTHER): Payer: Self-pay | Admitting: Family Medicine

## 2019-03-21 DIAGNOSIS — E559 Vitamin D deficiency, unspecified: Secondary | ICD-10-CM | POA: Insufficient documentation

## 2019-03-21 NOTE — Progress Notes (Signed)
Office: [phone]  /  Fax: [phone] TeleHealth Visit:  Alisha Chen has verbally consented to this TeleHealth visit today. The patient is located at home, the provider is located at the [employer] [facility] office. The participants in this visit include the listed provider and patient. The visit was conducted today via FaceTime.  HPI:   Chief Complaint: OBESITY Alisha Chen is here to discuss her progress with her obesity treatment plan. She is on the Category 2 plan and is following her eating plan approximately 50% of the time. She states she is exercising 0 minutes 0 times per week. Alisha Chen feels she has maintained her weight and reports her weight was 198 lbs on 03/19/2019. She has been snacking more and not sticking to the diet well. She does not do well on the weekends on the plan.  We were unable to weigh the patient today for this TeleHealth visit. She feels as if she has maintained her weight since her last visit. She has lost 6 lbs since starting treatment with Alisha Chen.  Insulin Resistance Alisha Chen has a diagnosis of insulin resistance based on her elevated fasting insulin level >5. Although [name]'s blood glucose readings are still under good control, insulin resistance puts her at greater risk of metabolic syndrome and diabetes. She is not taking metformin currently and continues to work on diet and exercise to decrease risk of diabetes. Alisha Chen states she struggles with cravings and hunger. Her cravings occur after breakfast or dinner. She states she is open to trying metformin.  Vitamin D deficiency Alisha Chen has a diagnosis of Vitamin D deficiency, which is not at goal. She is currently taking OTC Vit D 1000 IU daily and denies nausea, vomiting or muscle weakness.  ASSESSMENT AND PLAN:  Insulin resistance - Plan: metFORMIN (GLUCOPHAGE) 500 MG tablet  Vitamin D deficiency  Class 1 obesity with serious comorbidity and body mass index (BMI) of 31.0 to 31.9 in adult, unspecified  obesity type  PLAN:  Insulin Resistance Alisha Chen will continue to work on weight loss, exercise, and decreasing simple carbohydrates in her diet to help decrease the risk of diabetes. We dicussed metformin including benefits and risks. She was informed that eating too many simple carbohydrates or too many calories at one sitting increases the likelihood of GI side effects. Alisha Chen is open to trying metformin and was given a prescription for metformin 500 mg QD with meal #30 with 0 refills. She was advised she could take this with any meal. She agrees to follow-up with our clinic in 2 weeks.  Vitamin D Deficiency Alisha Chen was informed that low Vitamin D levels contributes to fatigue and are associated with obesity, breast, and colon cancer. She agrees to continue taking OTC Vit D and will follow-up for routine testing of Vitamin D in 1-2 months. She was informed of the risk of over-replacement of Vitamin D and agrees to not increase her dose unless she discusses this with Alisha Chen first. Alisha Chen agrees to follow-up with our clinic in 2 weeks.  Obesity Alisha Chen is currently in the action stage of change. As such, her goal is to continue with weight loss efforts. She has agreed to follow the Category 2 plan. Alisha Chen was instructed to weigh only once weekly. We discussed the following Behavioral Modification Strategies today: ways to avoid night time snacking, dealing with family or coworker sabotage, and planning for success. Alisha Chen has not been prescribed exercise at this time. [name] has agreed to follow-up with our clinic in 2 weeks. She was  informed of the importance of frequent follow-up visits to maximize her success with intensive lifestyle modifications for her multiple health conditions.  ALLERGIES: Allergies  Allergen Reactions   Penicillins Other (See Comments)    Not sure, but was told by mother never to take this medication as a child   Shellfish-Derived Products Other (See Comments)    Welts    Clarithromycin Other (See Comments)    REACTION: NAUSEA,WEAK,BITTER TASTE   Ondansetron Nausea And Vomiting    Headaches    MEDICATIONS: Current Outpatient Medications on File Prior to Visit  Medication Sig Dispense Refill   celecoxib (CELEBREX) 100 MG capsule Take 100 mg by mouth 2 (two) times daily.     cholecalciferol (VITAMIN D) 1000 UNITS tablet Take 1,000 Units by mouth daily.     ibuprofen (ADVIL,MOTRIN) 800 MG tablet Take 1 tablet (800 mg total) by mouth 3 (three) times daily. 21 tablet 0   Misc Natural Products (COLON CLEANSE PO) Take by mouth 3 (three) times daily. 3 pills nightly qhs     vitamin B-12 (CYANOCOBALAMIN) 1000 MCG tablet Take 1,000 mcg by mouth daily.     No current facility-administered medications on file prior to visit.     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,dcis   History of chemotherapy 03/23/12- 05/10/12   s/p 4 cycles of FEC    Hx: UTI (urinary tract infection)    Knee pain    Lactose intolerance    Neck pain    S/P radiation therapy 07/10/12-08/23/12   Left Breast: 50 Gy/25 Fractions; Boost: 10 Gy/5 Fractions   Status post chemotherapy 1 dose 05/23/12   Stopped after one cycle of Taxol due to Grade 2 Toxicity   Status post chemotherapy 1 dose 05/23/12   Stopped after one cycle of Taxol due to Grade 2 Toxicity    PAST SURGICAL HISTORY: Past Surgical History:  Procedure Laterality Date   AXILLARY SURGERY  03/06/12   left, regional resection, 1/3 nodes pos   BACK SURGERY     LUMBAR DECOMPRESSION   BREAST BIOPSY  01/09/2012   left breast 3 0'clock/ER?PR =positive,her 2 neg   BREAST LUMPECTOMY Left 02/10/12   Left Breast: 2 Foci of Invasive Ductal Caand High grade Ductal Carcinoma In Situ, 0/14 nodes Left Axilla Negative : Regional Resection of Lymph Nodes: 0/5 Nodes Negative   FOOT SURGERY     multiple    INTRAUTERINE DEVICE REMOVAL  01/24/12   needle core Biospy  01/09/12   Left Breast: Invasive Ductal Carcinoma, Lymph Node Axilla: Metastatic Mammary Carcinoma   PORTACATH PLACEMENT  02/10/2012   Procedure: INSERTION PORT-A-CATH;  Surgeon: Rolm Bookbinder, MD;  Location: [facility];  Service: General;  Laterality: Right;   Regional Resection  03/06/12   Left Axilla: 1/3 Nodes Metastatic Carcinoma   SPINE SURGERY  01/2011    SOCIAL HISTORY: Social History   Tobacco Use   Smoking status: Never Smoker   Smokeless tobacco: Never Used  Substance Use Topics   Alcohol use: No   Drug use: No    FAMILY HISTORY: Family History  Problem Relation Age of Onset   Diabetes Mother    Sudden death Mother    Schizophrenia Mother    Sleep apnea Mother    Obesity Mother    Seizures Son    ROS: Review of  Systems  Gastrointestinal: Negative for nausea and vomiting.  Musculoskeletal:       Negative for muscle weakness.   PHYSICAL EXAM: Pt in no acute distress  RECENT LABS AND TESTS: BMET    Component Value Date/Time   NA 139 12/10/2018 1151   NA 140 12/25/2014 1349   K 4.6 12/10/2018 1151   K 4.4 12/25/2014 1349   CL 100 12/10/2018 1151   CL 105 12/21/2012 1527   CO2 23 12/10/2018 1151   CO2 27 12/25/2014 1349   GLUCOSE 71 12/10/2018 1151   GLUCOSE 71 12/25/2014 1349   GLUCOSE 75 12/21/2012 1527   BUN 15 12/10/2018 1151   BUN 22.5 12/25/2014 1349   CREATININE 0.81 12/10/2018 1151   CREATININE 0.9 12/25/2014 1349   CALCIUM 10.5 (H) 12/10/2018 1151   CALCIUM 10.2 12/25/2014 1349   GFRNONAA 83 12/10/2018 1151   GFRAA 96 12/10/2018 1151   Lab Results  Component Value Date   HGBA1C 4.9 12/10/2018   Lab Results  Component Value Date   INSULIN 16.6 12/10/2018   CBC    Component Value Date/Time   WBC 4.9 12/10/2018 1151   WBC 5.1 12/25/2014 1348   WBC 4.0 08/14/2014 0933   RBC 4.27 12/10/2018 1151   RBC 4.03 12/25/2014 1348   RBC 4.32  08/14/2014 0933   HGB 12.4 12/10/2018 1151   HGB 11.7 12/25/2014 1348   HCT 37.3 12/10/2018 1151   HCT 35.9 12/25/2014 1348   PLT 175 12/25/2014 1348   MCV 87 12/10/2018 1151   MCV 89.1 12/25/2014 1348   MCH 29.0 12/10/2018 1151   MCH 29.0 12/25/2014 1348   MCH 27.1 02/09/2012 0900   MCHC 33.2 12/10/2018 1151   MCHC 32.6 12/25/2014 1348   MCHC 33.1 08/14/2014 0933   RDW 12.2 12/10/2018 1151   RDW 12.6 12/25/2014 1348   LYMPHSABS 1.3 12/10/2018 1151   LYMPHSABS 1.6 12/25/2014 1348   MONOABS 0.3 12/25/2014 1348   EOSABS 0.1 12/10/2018 1151   BASOSABS 0.1 12/10/2018 1151   BASOSABS 0.0 12/25/2014 1348   Iron/TIBC/Ferritin/ %Sat No results found for: IRON, TIBC, FERRITIN, IRONPCTSAT Lipid Panel     Component Value Date/Time   CHOL 202 (H) 12/10/2018 1151   TRIG 61 12/10/2018 1151   HDL 57 12/10/2018 1151   CHOLHDL 3 08/14/2014 0933   VLDL 9.6 08/14/2014 0933   LDLCALC 133 (H) 12/10/2018 1151   Hepatic Function Panel     Component Value Date/Time   PROT 7.2 12/10/2018 1151   PROT 6.9 12/25/2014 1349   ALBUMIN 4.5 12/10/2018 1151   ALBUMIN 3.7 12/25/2014 1349   AST 18 12/10/2018 1151   AST 19 12/25/2014 1349   ALT 16 12/10/2018 1151   ALT 19 12/25/2014 1349   ALKPHOS 117 12/10/2018 1151   ALKPHOS 116 12/25/2014 1349   BILITOT 0.3 12/10/2018 1151   BILITOT 0.28 12/25/2014 1349   BILIDIR 0.1 08/14/2014 0933      Component Value Date/Time   TSH 4.760 (H) 12/10/2018 1151   TSH 3.50 08/14/2014 0933   TSH 3.14 09/20/2011 1558   Results for ESTEFANIE, CORNFORTH (MRN [id number]) as of 03/21/2019 08:47  Ref. Range 12/10/2018 11:51  Vitamin D, 25-Hydroxy Latest Ref Range: 30.0 - 100.0 ng/mL 43.1   I, Michaelene Song, am acting as Location manager for [employer], FNP-C.  I have reviewed the above documentation for accuracy and completeness, and I agree with the above.  - Chino Sardo, FNP-C.

## 2019-04-03 ENCOUNTER — Other Ambulatory Visit: Payer: Self-pay

## 2019-04-03 ENCOUNTER — Ambulatory Visit (INDEPENDENT_AMBULATORY_CARE_PROVIDER_SITE_OTHER): Payer: BC Managed Care – PPO | Admitting: Family Medicine

## 2019-04-03 DIAGNOSIS — E8881 Metabolic syndrome: Secondary | ICD-10-CM

## 2019-04-03 DIAGNOSIS — E559 Vitamin D deficiency, unspecified: Secondary | ICD-10-CM

## 2019-04-03 DIAGNOSIS — Z6831 Body mass index (BMI) 31.0-31.9, adult: Secondary | ICD-10-CM | POA: Diagnosis not present

## 2019-04-03 DIAGNOSIS — E669 Obesity, unspecified: Secondary | ICD-10-CM

## 2019-04-03 MED ORDER — METFORMIN HCL 500 MG PO TABS: 500.0000 mg | ORAL_TABLET | Freq: Every day | ORAL | 0 refills | Status: DC

## 2019-04-04 ENCOUNTER — Encounter (INDEPENDENT_AMBULATORY_CARE_PROVIDER_SITE_OTHER): Payer: Self-pay | Admitting: Family Medicine

## 2019-04-04 NOTE — Progress Notes (Signed)
Office: [phone]  /  Fax: [phone] TeleHealth Visit:  Alisha Chen has verbally consented to this TeleHealth visit today. The patient is located at home, the provider is located at the [employer] [facility] office. The participants in this visit include the listed provider and patient and any and all parties involved. The visit was conducted today via FaceTime.  HPI:   Chief Complaint: OBESITY Alisha Chen is here to discuss her progress with her obesity treatment plan. She is on the Category 2 plan and is following her eating plan approximately 60 % of the time. She states she is exercising on the elliptical for 10 to 20 minutes 2 times per week. Alisha Chen weighs 197 pounds today. She has lost 1 pound. Sometimes she eats more that the allotted 200 extra calories. She does feel she is doing much better on the plan than she was two weeks ago. We were unable to weigh the patient today for this TeleHealth visit. She feels as if she has lost weight since her last visit. She has lost 7 lbs since starting treatment with [place].  Insulin Resistance Alisha Chen has a diagnosis of insulin resistance based on her elevated fasting insulin level >5. Although Alisha Chen's blood glucose readings are still under good control, insulin resistance puts her at greater risk of metabolic syndrome and diabetes. She started taking metformin at the last visit. She is tolerating it well, though her stools are a bit loose. She feels it is helping with hunger and cravings. Alisha Chen continues to work on diet and exercise to decrease risk of diabetes. Lab Results  Component Value Date   HGBA1C 4.9 12/10/2018    Vitamin D deficiency Alisha Chen has a diagnosis of vitamin D deficiency. Her last vitamin D level was at 43.1 on 12/10/18 and was not at goal. She is currently taking vit D and denies nausea, vomiting or muscle weakness.  ASSESSMENT AND PLAN:  Insulin resistance - Plan: metFORMIN (GLUCOPHAGE) 500 MG tablet  Vitamin D  deficiency  Class 1 obesity with serious comorbidity and body mass index (BMI) of 31.0 to 31.9 in adult, unspecified obesity type  PLAN:  Insulin Resistance Alisha Chen will continue to work on weight loss, exercise, and decreasing simple carbohydrates in her diet to help decrease the risk of diabetes. She was informed that eating too many simple carbohydrates or too many calories at one sitting increases the likelihood of GI side effects. Alisha Chen agreed to continue metformin 500 mg once in the morning with breakfast #30 with no refills and follow up with [place] as directed to monitor her progress.  Vitamin D Deficiency Alisha Chen was informed that low vitamin D levels contributes to fatigue and are associated with obesity, breast, and colon cancer. She will continue to take OTC vitamin D and will follow up for routine testing of vitamin D, at least 2-3 times per year. She was informed of the risk of over-replacement of vitamin D and agrees to not increase her dose unless she discusses this with [place] first.  Obesity Alisha Chen is currently in the action stage of change. As such, her goal is to continue with weight loss efforts She has agreed to follow the Category 2 plan Alisha Chen will continue her current exercise regimen and she will increase frequency to 3 days per week for weight loss and overall health benefits. We discussed the following Behavioral Modification Strategies today: planning for success, increasing lean protein intake and decreasing simple carbohydrates   Alisha Chen has agreed to follow up with our  clinic in 2 weeks. She was informed of the importance of frequent follow up visits to maximize her success with intensive lifestyle modifications for her multiple health conditions.  ALLERGIES: Allergies  Allergen Reactions  . Penicillins Other (See Comments)    Not sure, but was told by mother never to take this medication as a child  . Shellfish-Derived Products Other (See Comments)    Welts  .  Clarithromycin Other (See Comments)    REACTION: NAUSEA,WEAK,BITTER TASTE  . Ondansetron Nausea And Vomiting    Headaches    MEDICATIONS: Current Outpatient Medications on File Prior to Visit  Medication Sig Dispense Refill  . celecoxib (CELEBREX) 100 MG capsule Take 100 mg by mouth 2 (two) times daily.    . cholecalciferol (VITAMIN D) 1000 UNITS tablet Take 1,000 Units by mouth daily.    Marland Kitchen ibuprofen (ADVIL,MOTRIN) 800 MG tablet Take 1 tablet (800 mg total) by mouth 3 (three) times daily. 21 tablet 0  . Misc Natural Products (COLON CLEANSE PO) Take by mouth 3 (three) times daily. 3 pills nightly qhs    . vitamin B-12 (CYANOCOBALAMIN) 1000 MCG tablet Take 1,000 mcg by mouth daily.     No current facility-administered medications on file prior to visit.     PAST MEDICAL HISTORY: Past Medical History:  Diagnosis Date  . Anemia   . Back pain   . 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 History of chemotherapy 03/23/12- 05/10/12   s/p 4 cycles of FEC   . Hx: UTI (urinary tract infection)   . Knee pain   . Lactose intolerance   . Neck pain   . S/P radiation therapy 07/10/12-08/23/12   Left Breast: 50 Gy/25 Fractions; Boost: 10 Gy/5 Fractions  . Status post chemotherapy 1 dose 05/23/12   Stopped after one cycle of Taxol due to Grade 2 Toxicity  . Status post chemotherapy 1 dose 05/23/12   Stopped after one cycle of Taxol due to Grade 2 Toxicity    PAST SURGICAL HISTORY: Past Surgical History:  Procedure Laterality Date  . AXILLARY SURGERY  03/06/12   left, regional resection, 1/3 nodes pos  . BACK SURGERY     LUMBAR DECOMPRESSION  . BREAST BIOPSY  01/09/2012   left breast 3 0'clock/ER?PR =positive,her 2 neg  . BREAST LUMPECTOMY Left 02/10/12   Left Breast: 2 Foci of Invasive Ductal Caand High grade Ductal Carcinoma In Situ, 0/14 nodes Left Axilla Negative : Regional Resection of Lymph Nodes: 0/5 Nodes Negative  . FOOT SURGERY     multiple  .  INTRAUTERINE DEVICE REMOVAL  01/24/12  . needle core Biospy  01/09/12   Left Breast: Invasive Ductal Carcinoma, Lymph Node Axilla: Metastatic Mammary Carcinoma  . PORTACATH PLACEMENT  02/10/2012   Procedure: INSERTION PORT-A-CATH;  Surgeon: Rolm Bookbinder, MD;  Location: [facility];  Service: General;  Laterality: Right;  . Regional Resection  03/06/12   Left Axilla: 1/3 Nodes Metastatic Carcinoma  . SPINE SURGERY  01/2011    SOCIAL HISTORY: Social History   Tobacco Use  . Smoking status: Never Smoker  . Smokeless tobacco: Never Used  Substance Use Topics  . Alcohol use: No  . Drug use: No    FAMILY HISTORY: Family History  Problem Relation Age of Onset  . Diabetes Mother   . Sudden death Mother   . Schizophrenia Mother   . Sleep apnea Mother   . Obesity Mother   . Seizures  Son     ROS: Review of Systems  Constitutional: Positive for weight loss.  Gastrointestinal: Positive for diarrhea. Negative for nausea and vomiting.  Musculoskeletal:       Negative for muscle weakness    PHYSICAL EXAM: Pt in no acute distress  RECENT LABS AND TESTS: BMET    Component Value Date/Time   NA 139 12/10/2018 1151   NA 140 12/25/2014 1349   K 4.6 12/10/2018 1151   K 4.4 12/25/2014 1349   CL 100 12/10/2018 1151   CL 105 12/21/2012 1527   CO2 23 12/10/2018 1151   CO2 27 12/25/2014 1349   GLUCOSE 71 12/10/2018 1151   GLUCOSE 71 12/25/2014 1349   GLUCOSE 75 12/21/2012 1527   BUN 15 12/10/2018 1151   BUN 22.5 12/25/2014 1349   CREATININE 0.81 12/10/2018 1151   CREATININE 0.9 12/25/2014 1349   CALCIUM 10.5 (H) 12/10/2018 1151   CALCIUM 10.2 12/25/2014 1349   GFRNONAA 83 12/10/2018 1151   GFRAA 96 12/10/2018 1151   Lab Results  Component Value Date   HGBA1C 4.9 12/10/2018   Lab Results  Component Value Date   INSULIN 16.6 12/10/2018   CBC    Component Value Date/Time   WBC 4.9 12/10/2018 1151   WBC 5.1 12/25/2014 1348   WBC 4.0 08/14/2014 0933   RBC  4.27 12/10/2018 1151   RBC 4.03 12/25/2014 1348   RBC 4.32 08/14/2014 0933   HGB 12.4 12/10/2018 1151   HGB 11.7 12/25/2014 1348   HCT 37.3 12/10/2018 1151   HCT 35.9 12/25/2014 1348   PLT 175 12/25/2014 1348   MCV 87 12/10/2018 1151   MCV 89.1 12/25/2014 1348   MCH 29.0 12/10/2018 1151   MCH 29.0 12/25/2014 1348   MCH 27.1 02/09/2012 0900   MCHC 33.2 12/10/2018 1151   MCHC 32.6 12/25/2014 1348   MCHC 33.1 08/14/2014 0933   RDW 12.2 12/10/2018 1151   RDW 12.6 12/25/2014 1348   LYMPHSABS 1.3 12/10/2018 1151   LYMPHSABS 1.6 12/25/2014 1348   MONOABS 0.3 12/25/2014 1348   EOSABS 0.1 12/10/2018 1151   BASOSABS 0.1 12/10/2018 1151   BASOSABS 0.0 12/25/2014 1348   Iron/TIBC/Ferritin/ %Sat No results found for: IRON, TIBC, FERRITIN, IRONPCTSAT Lipid Panel     Component Value Date/Time   CHOL 202 (H) 12/10/2018 1151   TRIG 61 12/10/2018 1151   HDL 57 12/10/2018 1151   CHOLHDL 3 08/14/2014 0933   VLDL 9.6 08/14/2014 0933   LDLCALC 133 (H) 12/10/2018 1151   Hepatic Function Panel     Component Value Date/Time   PROT 7.2 12/10/2018 1151   PROT 6.9 12/25/2014 1349   ALBUMIN 4.5 12/10/2018 1151   ALBUMIN 3.7 12/25/2014 1349   AST 18 12/10/2018 1151   AST 19 12/25/2014 1349   ALT 16 12/10/2018 1151   ALT 19 12/25/2014 1349   ALKPHOS 117 12/10/2018 1151   ALKPHOS 116 12/25/2014 1349   BILITOT 0.3 12/10/2018 1151   BILITOT 0.28 12/25/2014 1349   BILIDIR 0.1 08/14/2014 0933      Component Value Date/Time   TSH 4.760 (H) 12/10/2018 1151   TSH 3.50 08/14/2014 0933   TSH 3.14 09/20/2011 1558     Ref. Range 12/10/2018 11:51  Vitamin D, 25-Hydroxy Latest Ref Range: 30.0 - 100.0 ng/mL 43.1    I, Doreene Nest, am acting as Location manager for [employer], FNP-C.  I have reviewed the above documentation for accuracy and completeness, and I agree with the above.  -  [employer], FNP-C.

## 2019-04-17 ENCOUNTER — Ambulatory Visit (INDEPENDENT_AMBULATORY_CARE_PROVIDER_SITE_OTHER): Payer: BC Managed Care – PPO | Admitting: Family Medicine

## 2019-04-17 ENCOUNTER — Other Ambulatory Visit: Payer: Self-pay

## 2019-04-17 ENCOUNTER — Encounter (INDEPENDENT_AMBULATORY_CARE_PROVIDER_SITE_OTHER): Payer: Self-pay | Admitting: Family Medicine

## 2019-04-17 DIAGNOSIS — Z6831 Body mass index (BMI) 31.0-31.9, adult: Secondary | ICD-10-CM | POA: Diagnosis not present

## 2019-04-17 DIAGNOSIS — E669 Obesity, unspecified: Secondary | ICD-10-CM

## 2019-04-17 DIAGNOSIS — E8881 Metabolic syndrome: Secondary | ICD-10-CM | POA: Diagnosis not present

## 2019-04-17 NOTE — Progress Notes (Signed)
Office: [phone]  /  Fax: [phone] TeleHealth Visit:  Alisha Chen has verbally consented to this TeleHealth visit today. The patient is located at home, the provider is located at the [employer] [facility] office. The participants in this visit include the listed provider and patient. The visit was conducted today via telephone call (we were unable to connect withFaceTime).  HPI:   Chief Complaint: OBESITY Alisha Chen is here to discuss her progress with her obesity treatment plan. She is on the Category 2 plan and is following her eating plan approximately 70% of the time. She states she is walking 60 minutes 2 times per week and exercising on the elliptical 20-30 minutes 3 times per week. Alisha Chen states she has increased her exercise frequency. She reports weighing 197 lbs. today.  We were unable to weigh the patient today for this TeleHealth visit. She feels as if she has maintained her weight since her last visit. She has lost 6 lbs since starting treatment with [place].  Insulin Resistance Alisha Chen has a diagnosis of insulin resistance based on her elevated fasting insulin level >5. Although Alisha Chen's blood glucose readings are still under good control, insulin resistance puts her at greater risk of metabolic syndrome and diabetes. Alisha Chen started metformin about a month ago and is tolerating it well without side effects. She continues to work on diet and exercise to decrease risk of diabetes. No polyphagia. Lab Results  Component Value Date   HGBA1C 4.9 12/10/2018    ASSESSMENT AND PLAN:  Insulin resistance  Class 1 obesity with serious comorbidity and body mass index (BMI) of 31.0 to 31.9 in adult, unspecified obesity type  PLAN:  Insulin Resistance Alisha Chen will continue to work on weight loss, exercise, and decreasing simple carbohydrates in her diet to help decrease the risk of diabetes. Alisha Chen will continue metformin and follow-up with [place] as directed to monitor her progress.  Alisha Chen  spent > than 50% of the 15 minute visit on counseling as documented in the note.  Obesity Alisha Chen is currently in the action stage of change. As such, her goal is to continue with weight loss efforts. She has agreed to follow the Category 2 plan or journal 1150-1250 calories per day and 80-85 grams of protein. Handouts were sent to the patient via MyChart on Journaling and Eating Out. She may journal only at dinner or the complete day. Meal breakdown of calories and protein was provided. Alisha Chen has been instructed to continue her current exercise regimen for weight loss and overall health benefits. We discussed the following Behavioral Modification Strategies today: work on meal planning and easy cooking plans, planning for success, and keep a strict food journal.  Alisha Chen has agreed to follow-up with our clinic in 2 weeks. She was informed of the importance of frequent follow-up visits to maximize her success with intensive lifestyle modifications for her multiple health conditions.  ALLERGIES: Allergies  Allergen Reactions  . Penicillins Other (See Comments)    Not sure, but was told by mother never to take this medication as a child  . Shellfish-Derived Products Other (See Comments)    Welts  . Clarithromycin Other (See Comments)    REACTION: NAUSEA,WEAK,BITTER TASTE  . Ondansetron Nausea And Vomiting    Headaches    MEDICATIONS: Current Outpatient Medications on File Prior to Visit  Medication Sig Dispense Refill  . celecoxib (CELEBREX) 100 MG capsule Take 100 mg by mouth 2 (two) times daily.    . cholecalciferol (VITAMIN D) 1000 UNITS  tablet Take 1,000 Units by mouth daily.    Marland Kitchen ibuprofen (ADVIL,MOTRIN) 800 MG tablet Take 1 tablet (800 mg total) by mouth 3 (three) times daily. 21 tablet 0  . metFORMIN (GLUCOPHAGE) 500 MG tablet Take 1 tablet (500 mg total) by mouth daily with breakfast. 30 tablet 0  . Misc Natural Products (COLON CLEANSE PO) Take by mouth 3 (three) times daily. 3  pills nightly qhs    . vitamin B-12 (CYANOCOBALAMIN) 1000 MCG tablet Take 1,000 mcg by mouth daily.     No current facility-administered medications on file prior to visit.     PAST MEDICAL HISTORY: Past Medical History:  Diagnosis Date  . Anemia   . Back pain   . 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 History of chemotherapy 03/23/12- 05/10/12   s/p 4 cycles of FEC   . Hx: UTI (urinary tract infection)   . Knee pain   . Lactose intolerance   . Neck pain   . S/P radiation therapy 07/10/12-08/23/12   Left Breast: 50 Gy/25 Fractions; Boost: 10 Gy/5 Fractions  . Status post chemotherapy 1 dose 05/23/12   Stopped after one cycle of Taxol due to Grade 2 Toxicity  . Status post chemotherapy 1 dose 05/23/12   Stopped after one cycle of Taxol due to Grade 2 Toxicity    PAST SURGICAL HISTORY: Past Surgical History:  Procedure Laterality Date  . AXILLARY SURGERY  03/06/12   left, regional resection, 1/3 nodes pos  . BACK SURGERY     LUMBAR DECOMPRESSION  . BREAST BIOPSY  01/09/2012   left breast 3 0'clock/ER?PR =positive,her 2 neg  . BREAST LUMPECTOMY Left 02/10/12   Left Breast: 2 Foci of Invasive Ductal Caand High grade Ductal Carcinoma In Situ, 0/14 nodes Left Axilla Negative : Regional Resection of Lymph Nodes: 0/5 Nodes Negative  . FOOT SURGERY     multiple  . INTRAUTERINE DEVICE REMOVAL  01/24/12  . needle core Biospy  01/09/12   Left Breast: Invasive Ductal Carcinoma, Lymph Node Axilla: Metastatic Mammary Carcinoma  . PORTACATH PLACEMENT  02/10/2012   Procedure: INSERTION PORT-A-CATH;  Surgeon: Rolm Bookbinder, MD;  Location: [place];  Service: General;  Laterality: Right;  . Regional Resection  03/06/12   Left Axilla: 1/3 Nodes Metastatic Carcinoma  . SPINE SURGERY  01/2011    SOCIAL HISTORY: Social History   Tobacco Use  . Smoking status: Never Smoker  . Smokeless tobacco: Never Used  Substance Use Topics  . Alcohol  use: No  . Drug use: No    FAMILY HISTORY: Family History  Problem Relation Age of Onset  . Diabetes Mother   . Sudden death Mother   . Schizophrenia Mother   . Sleep apnea Mother   . Obesity Mother   . Seizures Son    ROS: Review of Systems  Endo/Heme/Allergies:       Negative for polyphagia.   PHYSICAL EXAM: Pt in no acute distress  RECENT LABS AND TESTS: BMET    Component Value Date/Time   NA 139 12/10/2018 1151   NA 140 12/25/2014 1349   K 4.6 12/10/2018 1151   K 4.4 12/25/2014 1349   CL 100 12/10/2018 1151   CL 105 12/21/2012 1527   CO2 23 12/10/2018 1151   CO2 27 12/25/2014 1349   GLUCOSE 71 12/10/2018 1151   GLUCOSE 71 12/25/2014 1349   GLUCOSE 75 12/21/2012 1527   BUN 15 12/10/2018 1151  BUN 22.5 12/25/2014 1349   CREATININE 0.81 12/10/2018 1151   CREATININE 0.9 12/25/2014 1349   CALCIUM 10.5 (H) 12/10/2018 1151   CALCIUM 10.2 12/25/2014 1349   GFRNONAA 83 12/10/2018 1151   GFRAA 96 12/10/2018 1151   Lab Results  Component Value Date   HGBA1C 4.9 12/10/2018   Lab Results  Component Value Date   INSULIN 16.6 12/10/2018   CBC    Component Value Date/Time   WBC 4.9 12/10/2018 1151   WBC 5.1 12/25/2014 1348   WBC 4.0 08/14/2014 0933   RBC 4.27 12/10/2018 1151   RBC 4.03 12/25/2014 1348   RBC 4.32 08/14/2014 0933   HGB 12.4 12/10/2018 1151   HGB 11.7 12/25/2014 1348   HCT 37.3 12/10/2018 1151   HCT 35.9 12/25/2014 1348   PLT 175 12/25/2014 1348   MCV 87 12/10/2018 1151   MCV 89.1 12/25/2014 1348   MCH 29.0 12/10/2018 1151   MCH 29.0 12/25/2014 1348   MCH 27.1 02/09/2012 0900   MCHC 33.2 12/10/2018 1151   MCHC 32.6 12/25/2014 1348   MCHC 33.1 08/14/2014 0933   RDW 12.2 12/10/2018 1151   RDW 12.6 12/25/2014 1348   LYMPHSABS 1.3 12/10/2018 1151   LYMPHSABS 1.6 12/25/2014 1348   MONOABS 0.3 12/25/2014 1348   EOSABS 0.1 12/10/2018 1151   BASOSABS 0.1 12/10/2018 1151   BASOSABS 0.0 12/25/2014 1348   Iron/TIBC/Ferritin/ %Sat No  results found for: IRON, TIBC, FERRITIN, IRONPCTSAT Lipid Panel     Component Value Date/Time   CHOL 202 (H) 12/10/2018 1151   TRIG 61 12/10/2018 1151   HDL 57 12/10/2018 1151   CHOLHDL 3 08/14/2014 0933   VLDL 9.6 08/14/2014 0933   LDLCALC 133 (H) 12/10/2018 1151   Hepatic Function Panel     Component Value Date/Time   PROT 7.2 12/10/2018 1151   PROT 6.9 12/25/2014 1349   ALBUMIN 4.5 12/10/2018 1151   ALBUMIN 3.7 12/25/2014 1349   AST 18 12/10/2018 1151   AST 19 12/25/2014 1349   ALT 16 12/10/2018 1151   ALT 19 12/25/2014 1349   ALKPHOS 117 12/10/2018 1151   ALKPHOS 116 12/25/2014 1349   BILITOT 0.3 12/10/2018 1151   BILITOT 0.28 12/25/2014 1349   BILIDIR 0.1 08/14/2014 0933      Component Value Date/Time   TSH 4.760 (H) 12/10/2018 1151   TSH 3.50 08/14/2014 0933   TSH 3.14 09/20/2011 1558   Results for INNA, TISDELL (MRN [id number]) as of 04/17/2019 15:21  Ref. Range 12/10/2018 11:51  Vitamin D, 25-Hydroxy Latest Ref Range: 30.0 - 100.0 ng/mL 43.1    Alisha Chen, Michaelene Song, am acting as Location manager for [employer], FNP-C.  Alisha Chen have reviewed the above documentation for accuracy and completeness, and Alisha Chen agree with the above.  -  , FNP-C.

## 2019-04-18 ENCOUNTER — Encounter (INDEPENDENT_AMBULATORY_CARE_PROVIDER_SITE_OTHER): Payer: Self-pay | Admitting: Family Medicine

## 2019-04-30 ENCOUNTER — Encounter (INDEPENDENT_AMBULATORY_CARE_PROVIDER_SITE_OTHER): Payer: Self-pay | Admitting: Family Medicine

## 2019-04-30 ENCOUNTER — Ambulatory Visit (INDEPENDENT_AMBULATORY_CARE_PROVIDER_SITE_OTHER): Payer: BC Managed Care – PPO | Admitting: Family Medicine

## 2019-04-30 ENCOUNTER — Other Ambulatory Visit: Payer: Self-pay

## 2019-04-30 DIAGNOSIS — Z6831 Body mass index (BMI) 31.0-31.9, adult: Secondary | ICD-10-CM | POA: Diagnosis not present

## 2019-04-30 DIAGNOSIS — E559 Vitamin D deficiency, unspecified: Secondary | ICD-10-CM

## 2019-04-30 DIAGNOSIS — E669 Obesity, unspecified: Secondary | ICD-10-CM

## 2019-04-30 DIAGNOSIS — E8881 Metabolic syndrome: Secondary | ICD-10-CM | POA: Diagnosis not present

## 2019-04-30 MED ORDER — METFORMIN HCL 500 MG PO TABS: 500.0000 mg | ORAL_TABLET | Freq: Every day | ORAL | 0 refills | Status: DC

## 2019-04-30 NOTE — Progress Notes (Signed)
Office: [phone]  /  Fax: [phone] TeleHealth Visit:  Alisha Chen has verbally consented to this TeleHealth visit today. The patient is located at home, the provider is located at the [employer] [facility] office. The participants in this visit include the listed provider and patient. The visit was conducted today via face time.  HPI:   Chief Complaint: OBESITY Alisha Chen is here to discuss her progress with her obesity treatment plan. She is on the keep a food journal with 1150-1250 calories and 80-85 grams of protein daily or follow the Category 2 plan and is following her eating plan approximately 60-70 % of the time. She states she is walking and riding the bike for 60 minutes 3 times per week. Alisha Chen is weighing at 198 lbs today. She is up 1 lb from her last telehealth visit. Journaling was added at last visit, and she feels she is a bit too free with eating and does not stay on the plan. However, she wants to continue to journal because she has now gotten used to it.  We were unable to weigh the patient today for this TeleHealth visit. She feels as if she has gained 1 lb since her last visit. She has lost 6 lbs since starting treatment with [place].  Insulin Resistance Alisha Chen has a diagnosis of insulin resistance based on her elevated fasting insulin level >5. Although [name]'s blood glucose readings are still under good control, insulin resistance puts her at greater risk of metabolic syndrome and diabetes. She denies polyphagia and does not always take metformin. She continues to work on diet and exercise to decrease risk of diabetes.  Vitamin D Deficiency Alisha Chen has a diagnosis of vitamin D deficiency. She is currently taking prescription Vit D, but level is not quite at goal. Last Vit D level was 43.1 on 12/10/2018. She denies nausea, vomiting or muscle weakness.  ASSESSMENT AND PLAN:  Insulin resistance - Plan: metFORMIN (GLUCOPHAGE) 500 MG tablet  Vitamin D deficiency  Class  1 obesity with serious comorbidity and body mass index (BMI) of 31.0 to 31.9 in adult, unspecified obesity type  PLAN:  Insulin Resistance Alisha Chen will continue to work on weight loss, exercise, and decreasing simple carbohydrates in her diet to help decrease the risk of diabetes. We dicussed metformin including benefits and risks. She was informed that eating too many simple carbohydrates or too many calories at one sitting increases the likelihood of GI side effects. Alisha Chen agrees to continue taking metformin 500 mg q AM with breakfast #30 and we will refill for 1 month. Alisha Chen agrees to follow up with our clinic in 2 to 3 weeks as directed to monitor her progress.  Vitamin D Deficiency Alisha Chen was informed that low vitamin D levels contributes to fatigue and are associated with obesity, breast, and colon cancer. [name] agrees to continue taking prescription Vit D 1,000 IU daily and will follow up for routine testing of vitamin D, at least 2-3 times per year. She was informed of the risk of over-replacement of vitamin D and agrees to not increase her dose unless she discusses this with [place] first. We will recheck Vit D level at her next in person office visit. Alisha Chen agrees to follow up with our clinic in 2 to 3 weeks.  Obesity Alisha Chen is currently in the action stage of change. As such, her goal is to continue with weight loss efforts She has agreed to keep a food journal with 1150-1250 calories and 80-85 grams of protein  daily or follow the Category 2 plan Alisha Chen has been instructed to work up to a goal of 150 minutes of combined cardio and strengthening exercise per week or continue current regimen for weight loss and overall health benefits. We discussed the following Behavioral Modification Strategies today: planning for success and keep a strict food journal   Alisha Chen has agreed to follow up with our clinic in 2 to 3 weeks. She was informed of the importance of frequent follow up visits to maximize her  success with intensive lifestyle modifications for her multiple health conditions.  ALLERGIES: Allergies  Allergen Reactions  . Penicillins Other (See Comments)    Not sure, but was told by mother never to take this medication as a child  . Shellfish-Derived Products Other (See Comments)    Welts  . Clarithromycin Other (See Comments)    REACTION: NAUSEA,WEAK,BITTER TASTE  . Ondansetron Nausea And Vomiting    Headaches    MEDICATIONS: Current Outpatient Medications on File Prior to Visit  Medication Sig Dispense Refill  . celecoxib (CELEBREX) 100 MG capsule Take 100 mg by mouth 2 (two) times daily.    . cholecalciferol (VITAMIN D) 1000 UNITS tablet Take 1,000 Units by mouth daily.    Marland Kitchen ibuprofen (ADVIL,MOTRIN) 800 MG tablet Take 1 tablet (800 mg total) by mouth 3 (three) times daily. 21 tablet 0  . Misc Natural Products (COLON CLEANSE PO) Take by mouth 3 (three) times daily. 3 pills nightly qhs    . vitamin B-12 (CYANOCOBALAMIN) 1000 MCG tablet Take 1,000 mcg by mouth daily.     No current facility-administered medications on file prior to visit.     PAST MEDICAL HISTORY: Past Medical History:  Diagnosis Date  . Anemia   . Back pain   . 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 History of chemotherapy 03/23/12- 05/10/12   s/p 4 cycles of FEC   . Hx: UTI (urinary tract infection)   . Knee pain   . Lactose intolerance   . Neck pain   . S/P radiation therapy 07/10/12-08/23/12   Left Breast: 50 Gy/25 Fractions; Boost: 10 Gy/5 Fractions  . Status post chemotherapy 1 dose 05/23/12   Stopped after one cycle of Taxol due to Grade 2 Toxicity  . Status post chemotherapy 1 dose 05/23/12   Stopped after one cycle of Taxol due to Grade 2 Toxicity    PAST SURGICAL HISTORY: Past Surgical History:  Procedure Laterality Date  . AXILLARY SURGERY  03/06/12   left, regional resection, 1/3 nodes pos  . BACK SURGERY     LUMBAR DECOMPRESSION  . BREAST  BIOPSY  01/09/2012   left breast 3 0'clock/ER?PR =positive,her 2 neg  . BREAST LUMPECTOMY Left 02/10/12   Left Breast: 2 Foci of Invasive Ductal Caand High grade Ductal Carcinoma In Situ, 0/14 nodes Left Axilla Negative : Regional Resection of Lymph Nodes: 0/5 Nodes Negative  . FOOT SURGERY     multiple  . INTRAUTERINE DEVICE REMOVAL  01/24/12  . needle core Biospy  01/09/12   Left Breast: Invasive Ductal Carcinoma, Lymph Node Axilla: Metastatic Mammary Carcinoma  . PORTACATH PLACEMENT  02/10/2012   Procedure: INSERTION PORT-A-CATH;  Surgeon: Rolm Bookbinder, MD;  Location: [place];  Service: General;  Laterality: Right;  . Regional Resection  03/06/12   Left Axilla: 1/3 Nodes Metastatic Carcinoma  . SPINE SURGERY  01/2011    SOCIAL HISTORY: Social History   Tobacco Use  .  Smoking status: Never Smoker  . Smokeless tobacco: Never Used  Substance Use Topics  . Alcohol use: No  . Drug use: No    FAMILY HISTORY: Family History  Problem Relation Age of Onset  . Diabetes Mother   . Sudden death Mother   . Schizophrenia Mother   . Sleep apnea Mother   . Obesity Mother   . Seizures Son     ROS: Review of Systems  Constitutional: Negative for weight loss.  Gastrointestinal: Negative for nausea and vomiting.  Musculoskeletal:       Negative muscle weakness  Endo/Heme/Allergies:       Negative polyphagia    PHYSICAL EXAM: Pt in no acute distress  RECENT LABS AND TESTS: BMET    Component Value Date/Time   NA 139 12/10/2018 1151   NA 140 12/25/2014 1349   K 4.6 12/10/2018 1151   K 4.4 12/25/2014 1349   CL 100 12/10/2018 1151   CL 105 12/21/2012 1527   CO2 23 12/10/2018 1151   CO2 27 12/25/2014 1349   GLUCOSE 71 12/10/2018 1151   GLUCOSE 71 12/25/2014 1349   GLUCOSE 75 12/21/2012 1527   BUN 15 12/10/2018 1151   BUN 22.5 12/25/2014 1349   CREATININE 0.81 12/10/2018 1151   CREATININE 0.9 12/25/2014 1349   CALCIUM 10.5 (H) 12/10/2018 1151   CALCIUM  10.2 12/25/2014 1349   GFRNONAA 83 12/10/2018 1151   GFRAA 96 12/10/2018 1151   Lab Results  Component Value Date   HGBA1C 4.9 12/10/2018   Lab Results  Component Value Date   INSULIN 16.6 12/10/2018   CBC    Component Value Date/Time   WBC 4.9 12/10/2018 1151   WBC 5.1 12/25/2014 1348   WBC 4.0 08/14/2014 0933   RBC 4.27 12/10/2018 1151   RBC 4.03 12/25/2014 1348   RBC 4.32 08/14/2014 0933   HGB 12.4 12/10/2018 1151   HGB 11.7 12/25/2014 1348   HCT 37.3 12/10/2018 1151   HCT 35.9 12/25/2014 1348   PLT 175 12/25/2014 1348   MCV 87 12/10/2018 1151   MCV 89.1 12/25/2014 1348   MCH 29.0 12/10/2018 1151   MCH 29.0 12/25/2014 1348   MCH 27.1 02/09/2012 0900   MCHC 33.2 12/10/2018 1151   MCHC 32.6 12/25/2014 1348   MCHC 33.1 08/14/2014 0933   RDW 12.2 12/10/2018 1151   RDW 12.6 12/25/2014 1348   LYMPHSABS 1.3 12/10/2018 1151   LYMPHSABS 1.6 12/25/2014 1348   MONOABS 0.3 12/25/2014 1348   EOSABS 0.1 12/10/2018 1151   BASOSABS 0.1 12/10/2018 1151   BASOSABS 0.0 12/25/2014 1348   Iron/TIBC/Ferritin/ %Sat No results found for: IRON, TIBC, FERRITIN, IRONPCTSAT Lipid Panel     Component Value Date/Time   CHOL 202 (H) 12/10/2018 1151   TRIG 61 12/10/2018 1151   HDL 57 12/10/2018 1151   CHOLHDL 3 08/14/2014 0933   VLDL 9.6 08/14/2014 0933   LDLCALC 133 (H) 12/10/2018 1151   Hepatic Function Panel     Component Value Date/Time   PROT 7.2 12/10/2018 1151   PROT 6.9 12/25/2014 1349   ALBUMIN 4.5 12/10/2018 1151   ALBUMIN 3.7 12/25/2014 1349   AST 18 12/10/2018 1151   AST 19 12/25/2014 1349   ALT 16 12/10/2018 1151   ALT 19 12/25/2014 1349   ALKPHOS 117 12/10/2018 1151   ALKPHOS 116 12/25/2014 1349   BILITOT 0.3 12/10/2018 1151   BILITOT 0.28 12/25/2014 1349   BILIDIR 0.1 08/14/2014 0933      Component  Value Date/Time   TSH 4.760 (H) 12/10/2018 1151   TSH 3.50 08/14/2014 0933   TSH 3.14 09/20/2011 1558      I, Trixie Dredge, am acting as Location manager  for [employer], FNP-C  I have reviewed the above documentation for accuracy and completeness, and I agree with the above.  - Jebadiah Imperato, FNP-C.

## 2019-05-02 ENCOUNTER — Encounter (INDEPENDENT_AMBULATORY_CARE_PROVIDER_SITE_OTHER): Payer: Self-pay | Admitting: Family Medicine

## 2019-05-11 ENCOUNTER — Encounter (INDEPENDENT_AMBULATORY_CARE_PROVIDER_SITE_OTHER): Payer: Self-pay | Admitting: Family Medicine

## 2019-05-12 NOTE — Telephone Encounter (Signed)
Please advise 

## 2019-05-13 ENCOUNTER — Encounter: Payer: Self-pay | Admitting: Family Medicine

## 2019-05-13 ENCOUNTER — Other Ambulatory Visit: Payer: Self-pay

## 2019-05-13 ENCOUNTER — Ambulatory Visit (INDEPENDENT_AMBULATORY_CARE_PROVIDER_SITE_OTHER): Payer: BC Managed Care – PPO | Admitting: Family Medicine

## 2019-05-13 ENCOUNTER — Telehealth: Payer: Self-pay | Admitting: Family Medicine

## 2019-05-13 ENCOUNTER — Ambulatory Visit: Payer: Self-pay | Admitting: Family Medicine

## 2019-05-13 VITALS — Temp 100.7°F | Ht 66.0 in | Wt 198.0 lb

## 2019-05-13 DIAGNOSIS — R509 Fever, unspecified: Secondary | ICD-10-CM | POA: Diagnosis not present

## 2019-05-13 DIAGNOSIS — Z20822 Contact with and (suspected) exposure to covid-19: Secondary | ICD-10-CM

## 2019-05-13 DIAGNOSIS — G43909 Migraine, unspecified, not intractable, without status migrainosus: Secondary | ICD-10-CM

## 2019-05-13 MED ORDER — RIZATRIPTAN BENZOATE 5 MG PO TABS: 5.0000 mg | ORAL_TABLET | ORAL | 0 refills | Status: DC | PRN

## 2019-05-13 NOTE — Progress Notes (Signed)
Virtual Visit via Video Note  I connected with CHAELI JUDY on 05/13/19 at  2:45 PM EDT by a video enabled telemedicine application and verified that I am speaking with the correct person using two identifiers.  Location: Patient: home  Provider: office   I discussed the limitations of evaluation and management by telemedicine and the availability of in person appointments. The patient expressed understanding and agreed to proceed.  History of Present Illness: Pt is home c/o migraine and fever 100.8 +occas wet cough -- not productive  Her son has a cough as well  No other symptoms    Past Medical History:  Diagnosis Date  . Anemia   . Back pain   . 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 History of chemotherapy 03/23/12- 05/10/12   s/p 4 cycles of FEC   . Hx: UTI (urinary tract infection)   . Knee pain   . Lactose intolerance   . Neck pain   . S/P radiation therapy 07/10/12-08/23/12   Left Breast: 50 Gy/25 Fractions; Boost: 10 Gy/5 Fractions  . Status post chemotherapy 1 dose 05/23/12   Stopped after one cycle of Taxol due to Grade 2 Toxicity  . Status post chemotherapy 1 dose 05/23/12   Stopped after one cycle of Taxol due to Grade 2 Toxicity   Current Outpatient Medications on File Prior to Visit  Medication Sig Dispense Refill  . celecoxib (CELEBREX) 100 MG capsule Take 100 mg by mouth 2 (two) times daily.    . cholecalciferol (VITAMIN D) 1000 UNITS tablet Take 1,000 Units by mouth daily.    Marland Kitchen ibuprofen (ADVIL,MOTRIN) 800 MG tablet Take 1 tablet (800 mg total) by mouth 3 (three) times daily. 21 tablet 0  . metFORMIN (GLUCOPHAGE) 500 MG tablet Take 1 tablet (500 mg total) by mouth daily with breakfast. 30 tablet 0  . Misc Natural Products (COLON CLEANSE PO) Take by mouth 3 (three) times daily. 3 pills nightly qhs    . vitamin B-12 (CYANOCOBALAMIN) 1000 MCG tablet Take 1,000 mcg by mouth daily.     No current facility-administered  medications on file prior to visit.     Observations/Objective: Vitals:   05/13/19 1319  Temp: (!) 100.7 F (38.2 C)  no other vitals obtained    Assessment and Plan: 1. Migraine without status migrainosus, not intractable, unspecified migraine type Drink plenty of fluid  Try maxalt  If no relief or worsens--- go to ER  - rizatriptan (MAXALT) 5 MG tablet; Take 1 tablet (5 mg total) by mouth as needed for migraine. May repeat in 2 hours if needed  Dispense: 10 tablet; Refill: 0  2. Fever, unspecified fever cause covid testing ordered for her and her son  Go to ER if symptoms worsen   Follow Up Instructions:    I discussed the assessment and treatment plan with the patient. The patient was provided an opportunity to ask questions and all were answered. The patient agreed with the plan and demonstrated an understanding of the instructions.   The patient was advised to call back or seek an in-person evaluation if the symptoms worsen or if the condition fails to improve as anticipated.  I provided 25 minutes of non-face-to-face time during this encounter.   [name], DO

## 2019-05-13 NOTE — Telephone Encounter (Signed)
Spoke with patient- scheduled her for appointment tomorrow morning for COVID 19 testing at [facility] building at 8:45 am.  Testing protocol reviewed with patient, she expressed understanding.

## 2019-05-13 NOTE — Telephone Encounter (Signed)
Pt with fever and migraine

## 2019-05-13 NOTE — Telephone Encounter (Signed)
Virtual visit scheduled.  

## 2019-05-13 NOTE — Progress Notes (Deleted)
Patient ID: Alisha Chen, female    DOB: Jun 06, 1966  Age: 53 y.o. MRN: [id number]    Subjective:  Subjective  HPI Alisha Chen presents for migraine and fever  Review of Systems  History Past Medical History:  Diagnosis Date  . Anemia   . Back pain   . 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 History of chemotherapy 03/23/12- 05/10/12   s/p 4 cycles of FEC   . Hx: UTI (urinary tract infection)   . Knee pain   . Lactose intolerance   . Neck pain   . S/P radiation therapy 07/10/12-08/23/12   Left Breast: 50 Gy/25 Fractions; Boost: 10 Gy/5 Fractions  . Status post chemotherapy 1 dose 05/23/12   Stopped after one cycle of Taxol due to Grade 2 Toxicity  . Status post chemotherapy 1 dose 05/23/12   Stopped after one cycle of Taxol due to Grade 2 Toxicity    She has a past surgical history that includes Spine surgery (01/2011); Foot surgery; Back surgery; Portacath placement (02/10/2012); needle core Biospy (01/09/12); Regional Resection (03/06/12); Intrauterine device removal (01/24/12); Axillary Surgery (03/06/12); Breast lumpectomy (Left, 02/10/12); and Breast biopsy (01/09/2012).   Her family history includes Diabetes in her mother; Obesity in her mother; Schizophrenia in her mother; Seizures in her son; Sleep apnea in her mother; Sudden death in her mother.She reports that she has never smoked. She has never used smokeless tobacco. She reports that she does not drink alcohol or use drugs.  Current Outpatient Medications on File Prior to Visit  Medication Sig Dispense Refill  . celecoxib (CELEBREX) 100 MG capsule Take 100 mg by mouth 2 (two) times daily.    . cholecalciferol (VITAMIN D) 1000 UNITS tablet Take 1,000 Units by mouth daily.    Marland Kitchen ibuprofen (ADVIL,MOTRIN) 800 MG tablet Take 1 tablet (800 mg total) by mouth 3 (three) times daily. 21 tablet 0  . metFORMIN (GLUCOPHAGE) 500 MG tablet Take 1 tablet (500 mg total) by mouth daily with breakfast.  30 tablet 0  . Misc Natural Products (COLON CLEANSE PO) Take by mouth 3 (three) times daily. 3 pills nightly qhs    . vitamin B-12 (CYANOCOBALAMIN) 1000 MCG tablet Take 1,000 mcg by mouth daily.     No current facility-administered medications on file prior to visit.      Objective:  Objective  Physical Exam Temp (!) 100.7 F (38.2 C) (Oral)   Ht 5\' 6"  (1.676 m)   Wt 198 lb (89.8 kg)   LMP 02/27/2012   BMI 31.96 kg/m  Wt Readings from Last 3 Encounters:  05/13/19 198 lb (89.8 kg)  01/23/19 195 lb (88.5 kg)  01/07/19 196 lb (88.9 kg)     Lab Results  Component Value Date   WBC 4.9 12/10/2018   HGB 12.4 12/10/2018   HCT 37.3 12/10/2018   PLT 175 12/25/2014   GLUCOSE 71 12/10/2018   CHOL 202 (H) 12/10/2018   TRIG 61 12/10/2018   HDL 57 12/10/2018   LDLCALC 133 (H) 12/10/2018   ALT 16 12/10/2018   AST 18 12/10/2018   NA 139 12/10/2018   K 4.6 12/10/2018   CL 100 12/10/2018   CREATININE 0.81 12/10/2018   BUN 15 12/10/2018   CO2 23 12/10/2018   TSH 4.760 (H) 12/10/2018   INR 1.04 02/10/2011   HGBA1C 4.9 12/10/2018    Mm Diag Breast Tomo Bilateral  Result Date: 10/30/2018 CLINICAL DATA:  53 year old female  status post malignant left lumpectomy with chemotherapy and radiation in 2013. No current symptoms. EXAM: DIGITAL DIAGNOSTIC BILATERAL MAMMOGRAM WITH CAD AND TOMO COMPARISON:  Previous exam(s). ACR Breast Density Category b: There are scattered areas of fibroglandular density. FINDINGS: Stable posttreatment changes are demonstrated on the left. No new or suspicious findings are identified in either breast. The parenchymal pattern is stable. Mammographic images were processed with CAD. IMPRESSION: 1. No mammographic evidence of malignancy in either breast. 2. Stable left breast posttreatment changes. RECOMMENDATION: Screening mammogram in one year.(Code:[id number]-B-01Y) I have discussed the findings and recommendations with the patient. Results were also provided in writing at  the conclusion of the visit. If applicable, a reminder letter will be sent to the patient regarding the next appointment. BI-RADS CATEGORY  2: Benign. Electronically Signed   By: Kristopher Oppenheim M.D.   On: 10/30/2018 11:01     Assessment & Plan:  Plan  I am having Alisha Chen maintain her cholecalciferol, vitamin B-12, ibuprofen, Misc Natural Products (COLON CLEANSE PO), celecoxib, and metFORMIN.  No orders of the defined types were placed in this encounter.   Problem List Items Addressed This Visit    None      Follow-up: No follow-ups on file.  [name], DO

## 2019-05-13 NOTE — Telephone Encounter (Signed)
Pt. Reports she started having a migraine and fever yesterday. Temp. Today is 101.6. No other symptoms. Warm transfer to [facility] in the practice for a virtual visit.  Answer Assessment - Initial Assessment Questions 1. COVID-19 DIAGNOSIS: "Who made your Coronavirus (COVID-19) diagnosis?" "Was it confirmed by a positive lab test?" If not diagnosed by a HCP, ask "Are there lots of cases (community spread) where you live?" (See public health department website, if unsure)     No test 2. ONSET: "When did the COVID-19 symptoms start?"      Started yesterday 3. WORST SYMPTOM: "What is your worst symptom?" (e.g., cough, fever, shortness of breath, muscle aches)     Migraines  4. COUGH: "Do you have a cough?" If so, ask: "How bad is the cough?"       No 5. FEVER: "Do you have a fever?" If so, ask: "What is your temperature, how was it measured, and when did it start?"     101.6  This morning 6. RESPIRATORY STATUS: "Describe your breathing?" (e.g., shortness of breath, wheezing, unable to speak)      No 7. BETTER-SAME-WORSE: "Are you getting better, staying the same or getting worse compared to yesterday?"  If getting worse, ask, "In what way?"     Worse 8. HIGH RISK DISEASE: "Do you have any chronic medical problems?" (e.g., asthma, heart or lung disease, weak immune system, etc.)     No 9. PREGNANCY: "Is there any chance you are pregnant?" "When was your last menstrual period?"     No 10. OTHER SYMPTOMS: "Do you have any other symptoms?"  (e.g., chills, fatigue, headache, loss of smell or taste, muscle pain, sore throat)       Migraine  Protocols used: CORONAVIRUS (COVID-19) DIAGNOSED OR SUSPECTED-A-AH

## 2019-05-14 ENCOUNTER — Other Ambulatory Visit (INDEPENDENT_AMBULATORY_CARE_PROVIDER_SITE_OTHER): Payer: Self-pay | Admitting: Family Medicine

## 2019-05-14 ENCOUNTER — Encounter: Payer: Self-pay | Admitting: Family Medicine

## 2019-05-14 ENCOUNTER — Other Ambulatory Visit: Payer: BC Managed Care – PPO

## 2019-05-14 DIAGNOSIS — Z20822 Contact with and (suspected) exposure to covid-19: Secondary | ICD-10-CM

## 2019-05-14 DIAGNOSIS — E8881 Metabolic syndrome: Secondary | ICD-10-CM

## 2019-05-14 NOTE — Telephone Encounter (Signed)
Yes she can take tylenol Any other symptoms-?  Still just fever and headache?

## 2019-05-15 ENCOUNTER — Other Ambulatory Visit: Payer: Self-pay | Admitting: Family Medicine

## 2019-05-15 NOTE — Telephone Encounter (Signed)
Unfortunately we can not do imaging until covid test is back If the headache is severe-- she should go to the er -- they can check her safely if needed

## 2019-05-17 ENCOUNTER — Telehealth: Payer: Self-pay | Admitting: *Deleted

## 2019-05-17 LAB — NOVEL CORONAVIRUS, NAA: SARS-CoV-2, NAA: DETECTED — AB

## 2019-05-17 NOTE — Telephone Encounter (Signed)
Pt called to get results of COVID test; pt notified of positive result; she says that she is feeling well and does not have a fever; pt instructed to continue to  Self quarantine until the following is met: - at least 10 days since symptoms onset - AND 3 consecutive days fever free without antipyretics (acetaminophen [Tylenol] or ibuprofen [Advil]) - AND improvement in respiratory symptoms Patient Instructions .  Marland Kitchen Instruct the patient to wear a mask around people and follow good infection prevention techniques. . Patient to should only leave home to seek medical care. . Instruct patient to send family for food, prescriptions or medicines; or use delivery service.  . If the patient must leave the home, they must wear a mask in public. . Instruct patient to limit contact with immediate family members or caregivers in the home, and use mask, social distancing, and handwashing to decrease risk to patients. o Please continue good preventive care measures, including frequent hand washing, avoid touching your face, cover coughs/sneezes with tissue or into elbow, stay out of crowds and keep a 6-foot distance from others.   . Instruct patient and family to clean hard surfaces touched by patient frequently with household cleaning products  The pt verbalized understanding; she would like to schedule a follow up appointment with Dr Etter Sjogren; [facility] not available by phone to schedule appoint or be given lab results; will route to office for notification; the pt can be contacted at [phone];

## 2019-05-17 NOTE — Telephone Encounter (Signed)
Will need virtual visit only. Positive COVID 19 test.

## 2019-05-17 NOTE — Telephone Encounter (Signed)
Notified Sherri at [facility] of pt request.

## 2019-05-17 NOTE — Telephone Encounter (Signed)
Faxed positive COVID result to [place] [facility]. @ [phone].

## 2019-05-17 NOTE — Telephone Encounter (Signed)
Left message on voicemail of Alisha Luna, RN at [facility] to inform her of positive result; message given that positive results need to be faxed to [phone].Marland Kitchen

## 2019-05-20 ENCOUNTER — Ambulatory Visit (INDEPENDENT_AMBULATORY_CARE_PROVIDER_SITE_OTHER): Payer: BC Managed Care – PPO | Admitting: Family Medicine

## 2019-05-20 ENCOUNTER — Encounter: Payer: Self-pay | Admitting: Family Medicine

## 2019-05-20 ENCOUNTER — Other Ambulatory Visit: Payer: Self-pay

## 2019-05-20 VITALS — Temp 98.3°F

## 2019-05-20 DIAGNOSIS — R05 Cough: Secondary | ICD-10-CM

## 2019-05-20 DIAGNOSIS — C50412 Malignant neoplasm of upper-outer quadrant of left female breast: Secondary | ICD-10-CM | POA: Diagnosis not present

## 2019-05-20 DIAGNOSIS — R059 Cough, unspecified: Secondary | ICD-10-CM

## 2019-05-20 DIAGNOSIS — G43809 Other migraine, not intractable, without status migrainosus: Secondary | ICD-10-CM | POA: Diagnosis not present

## 2019-05-20 DIAGNOSIS — U071 COVID-19: Secondary | ICD-10-CM | POA: Diagnosis not present

## 2019-05-20 MED ORDER — PROMETHAZINE-DM 6.25-15 MG/5ML PO SYRP: 5.0000 mL | ORAL_SOLUTION | Freq: Four times a day (QID) | ORAL | 0 refills | Status: DC | PRN

## 2019-05-20 NOTE — Progress Notes (Signed)
Virtual Visit via Video Note  I connected with Alisha Chen on 05/20/19 at 10:45 AM EDT by a video enabled telemedicine application and verified that I am speaking with the correct person using two identifiers.  Location: Patient: home  Provider: office    I discussed the limitations of evaluation and management by telemedicine and the availability of in person appointments. The patient expressed understanding and agreed to proceed.  History of Present Illness: Pt is home with son and husband and tested +for covid  Pt still getting occasional headache but no fevers --- she is feeling better    Observations/Objective: No vitals obtained  Pt in NAD  Assessment and Plan: 1. Cough Not productive  Pt did not want abx covid + - promethazine-dextromethorphan (PROMETHAZINE-DM) 6.25-15 MG/5ML syrup; Take 5 mLs by mouth 4 (four) times daily as needed.  Dispense: 118 mL; Refill: 0  2. COVID-19 virus detected  - MyChart COVID-19 home monitoring program; Future - Temperature monitoring; Future  3. Other migraine without status migrainosus, not intractable maxalt helping     Follow Up Instructions:    I discussed the assessment and treatment plan with the patient. The patient was provided an opportunity to ask questions and all were answered. The patient agreed with the plan and demonstrated an understanding of the instructions.   The patient was advised to call back or seek an in-person evaluation if the symptoms worsen or if the condition fails to improve as anticipated.  I provided 15 minutes of non-face-to-face time during this encounter.   [name], DO

## 2019-05-20 NOTE — Assessment & Plan Note (Signed)
Pt symptoms improving  Pt quarantined as directed  If symptoms do not completely resolve she may need to be retested

## 2019-05-20 NOTE — Assessment & Plan Note (Signed)
Improving with maxalt  rto prn

## 2019-05-21 ENCOUNTER — Telehealth (INDEPENDENT_AMBULATORY_CARE_PROVIDER_SITE_OTHER): Payer: BC Managed Care – PPO | Admitting: Family Medicine

## 2019-05-26 ENCOUNTER — Encounter (INDEPENDENT_AMBULATORY_CARE_PROVIDER_SITE_OTHER): Payer: Self-pay | Admitting: Family Medicine

## 2019-05-29 ENCOUNTER — Telehealth: Payer: Self-pay

## 2019-05-29 NOTE — Telephone Encounter (Signed)
Forms received. Dr. Etter Sjogren not in office until Monday 06/03/2019

## 2019-05-31 ENCOUNTER — Other Ambulatory Visit: Payer: Self-pay | Admitting: Family Medicine

## 2019-06-03 ENCOUNTER — Telehealth: Payer: Self-pay

## 2019-06-03 DIAGNOSIS — Z0279 Encounter for issue of other medical certificate: Secondary | ICD-10-CM

## 2019-06-03 NOTE — Telephone Encounter (Signed)
Forms faxed. Received confirmation. Copies made for scan, billing, and original in folder.

## 2019-06-04 ENCOUNTER — Telehealth: Payer: Self-pay | Admitting: Family Medicine

## 2019-06-04 DIAGNOSIS — Z20822 Contact with and (suspected) exposure to covid-19: Secondary | ICD-10-CM

## 2019-06-04 NOTE — Telephone Encounter (Signed)
Please advise 

## 2019-06-04 NOTE — Telephone Encounter (Signed)
+  covid---  Needs retest

## 2019-06-04 NOTE — Telephone Encounter (Signed)
Pt states she left message at practice regarding covid testing for herself and son. States husband tested positive and is going tomorrow for retest.  Requesting orders for her son [place] as well.  States he is also a pt of Dr. Carollee Herter.  CB# [phone]

## 2019-06-05 ENCOUNTER — Other Ambulatory Visit: Payer: Self-pay | Admitting: Family Medicine

## 2019-06-05 ENCOUNTER — Ambulatory Visit: Payer: Self-pay | Admitting: *Deleted

## 2019-06-05 ENCOUNTER — Other Ambulatory Visit: Payer: Self-pay

## 2019-06-05 DIAGNOSIS — G43909 Migraine, unspecified, not intractable, without status migrainosus: Secondary | ICD-10-CM

## 2019-06-05 DIAGNOSIS — Z20822 Contact with and (suspected) exposure to covid-19: Secondary | ICD-10-CM

## 2019-06-05 NOTE — Telephone Encounter (Signed)
My family is having another COVID-19 test done today so we can get back to work.     I had a short term disability form I submitted to be filled out.   I was wondering if it was ready.  Also for my husband Alisha Chen.

## 2019-06-05 NOTE — Telephone Encounter (Signed)
Spoke with patient.  Advised her that an appointment is no longer needed at the testing site.  She plans to go to [facility] building today between 8a-3:45 pm for COVID 19 retest.  Testing protocol reviewed.  Order placed.

## 2019-06-06 ENCOUNTER — Telehealth (INDEPENDENT_AMBULATORY_CARE_PROVIDER_SITE_OTHER): Payer: BC Managed Care – PPO | Admitting: Bariatrics

## 2019-06-07 ENCOUNTER — Encounter (INDEPENDENT_AMBULATORY_CARE_PROVIDER_SITE_OTHER): Payer: Self-pay | Admitting: Bariatrics

## 2019-06-09 LAB — NOVEL CORONAVIRUS, NAA: SARS-CoV-2, NAA: NOT DETECTED

## 2019-06-10 ENCOUNTER — Encounter: Payer: Self-pay | Admitting: Family Medicine

## 2019-06-10 ENCOUNTER — Telehealth: Payer: Self-pay

## 2019-06-10 ENCOUNTER — Other Ambulatory Visit: Payer: BC Managed Care – PPO

## 2019-06-10 NOTE — Telephone Encounter (Signed)
Per pt. Request, mailed recent [place] results to home address.

## 2019-06-11 ENCOUNTER — Other Ambulatory Visit (INDEPENDENT_AMBULATORY_CARE_PROVIDER_SITE_OTHER): Payer: Self-pay | Admitting: Family Medicine

## 2019-06-11 DIAGNOSIS — E8881 Metabolic syndrome: Secondary | ICD-10-CM

## 2019-06-13 ENCOUNTER — Telehealth: Payer: Self-pay | Admitting: Family Medicine

## 2019-06-13 NOTE — Telephone Encounter (Signed)
Pt need to speak to nurse concerning her taking another covid test and she need a note to specific this. Pt want a call back to discuss what need to be in the note,

## 2019-06-18 ENCOUNTER — Other Ambulatory Visit: Payer: Self-pay

## 2019-06-18 ENCOUNTER — Telehealth (INDEPENDENT_AMBULATORY_CARE_PROVIDER_SITE_OTHER): Payer: BC Managed Care – PPO | Admitting: Bariatrics

## 2019-06-18 ENCOUNTER — Encounter (INDEPENDENT_AMBULATORY_CARE_PROVIDER_SITE_OTHER): Payer: Self-pay | Admitting: Bariatrics

## 2019-06-18 DIAGNOSIS — Z6834 Body mass index (BMI) 34.0-34.9, adult: Secondary | ICD-10-CM | POA: Diagnosis not present

## 2019-06-18 DIAGNOSIS — E669 Obesity, unspecified: Secondary | ICD-10-CM

## 2019-06-18 DIAGNOSIS — E559 Vitamin D deficiency, unspecified: Secondary | ICD-10-CM

## 2019-06-18 DIAGNOSIS — E8881 Metabolic syndrome: Secondary | ICD-10-CM

## 2019-06-18 NOTE — Telephone Encounter (Signed)
Results faxed to [phone] stating lab was not rapid

## 2019-06-18 NOTE — Telephone Encounter (Signed)
Pt called stating the treatment note that was written for her made it seem like a rapid test was provided for her. Please advise.   [phone] one [place] they are needing verification that it was not a rapid test but that she had to wait for results.  Pt is requesting this be done today if possible.

## 2019-06-19 NOTE — Progress Notes (Signed)
Office: [phone]  /  Fax: [phone] TeleHealth Visit:  Alisha Chen has verbally consented to this TeleHealth visit today. The patient is located at home, the provider is located at the [employer] [facility] office. The participants in this visit include the listed provider and patient and any and all parties involved. The visit was conducted today via FaceTime.  HPI:   Chief Complaint: OBESITY Alisha Chen is here to discuss her progress with her obesity treatment plan. She is on the keep a food journal with 1150 to 1250 calories and 80 to 85 grams of protein daily or the Category 2 plan and is following her eating plan approximately 70 % of the time. She states she is walking 3 hours 5 to 7 times per week and exercising on the elliptical 60 minutes 1 to 2 times per week. Alisha Chen states that she is up 1 pound (weight 199 lbs). She states that she had COVID-19 and recovering. She has had negative tests for COVID x2. Her activity is up, and she is doing well with her water intake. We were unable to weigh the patient today for this TeleHealth visit. She feels as if she has gained weight since her last visit. She has lost 2 lbs since starting treatment with [place].  Insulin Resistance Alisha Chen has a diagnosis of insulin resistance based on her elevated fasting insulin level >5. Although Janann's blood glucose readings are still under good control, insulin resistance puts her at greater risk of metabolic syndrome and diabetes. Her last A1c was at 4.9 and last insulin level was at 16.6 She is taking metformin; she stopped, but she re-started. Alisha Chen continues to work on diet and exercise to decrease risk of diabetes.  Vitamin D deficiency Alisha Chen has a diagnosis of vitamin D deficiency. Her last vitamin D level was at 43.1 She is currently taking OTC vit D and denies nausea, vomiting or muscle weakness.  ASSESSMENT AND PLAN:  Insulin resistance  Vitamin D deficiency  Class 1 obesity with serious  comorbidity and body mass index (BMI) of 34.0 to 34.9 in adult, unspecified obesity type  PLAN:  Insulin Resistance Alisha Chen will continue to work on weight loss, exercise, and decreasing simple carbohydrates in her diet to help decrease the risk of diabetes. We dicussed metformin including benefits and risks. She was informed that eating too many simple carbohydrates or too many calories at one sitting increases the likelihood of GI side effects. Alisha Chen will continue metformin for now and prescription was not written today. Alisha Chen agreed to follow up with [place] as directed to monitor her progress.  Vitamin D Deficiency Alisha Chen was informed that low vitamin D levels contributes to fatigue and are associated with obesity, breast, and colon cancer. She will continue OTC vitamin D and will follow up for routine testing of vitamin D, at least 2-3 times per year. She was informed of the risk of over-replacement of vitamin D and agrees to not increase her dose unless she discusses this with [place] first.  Obesity Alisha Chen is currently in the action stage of change. As such, her goal is to continue with weight loss efforts She has agreed to keep a food journal with 1150 to 1200 calories and 80 to 85 grams protein daily or follow the Category 2 plan Alisha Chen will continue activity for weight loss and overall health benefits. We discussed the following Behavioral Modification Strategies today: increase H2O intake, no skipping meals, keeping healthy foods in the home, increasing lean protein intake, decreasing  simple carbohydrates, increasing vegetables, decrease eating out and work on meal planning and intentional eating Alisha Chen will be consistent with calories (protein).  Alisha Chen has agreed to follow up with our clinic in 2 weeks. She was informed of the importance of frequent follow up visits to maximize her success with intensive lifestyle modifications for her multiple health conditions.  ALLERGIES: Allergies  Allergen  Reactions  . Penicillins Other (See Comments)    Not sure, but was told by mother never to take this medication as a child  . Shellfish-Derived Products Other (See Comments)    Welts  . Clarithromycin Other (See Comments)    REACTION: NAUSEA,WEAK,BITTER TASTE  . Ondansetron Nausea And Vomiting    Headaches    MEDICATIONS: Current Outpatient Medications on File Prior to Visit  Medication Sig Dispense Refill  . celecoxib (CELEBREX) 100 MG capsule Take 100 mg by mouth 2 (two) times daily.    . cholecalciferol (VITAMIN D) 1000 UNITS tablet Take 1,000 Units by mouth daily.    Alisha Chen Kitchen ibuprofen (ADVIL,MOTRIN) 800 MG tablet Take 1 tablet (800 mg total) by mouth 3 (three) times daily. 21 tablet 0  . metFORMIN (GLUCOPHAGE) 500 MG tablet Take 1 tablet (500 mg total) by mouth daily with breakfast. 30 tablet 0  . Misc Natural Products (COLON CLEANSE PO) Take by mouth 3 (three) times daily. 3 pills nightly qhs    . promethazine-dextromethorphan (PROMETHAZINE-DM) 6.25-15 MG/5ML syrup Take 5 mLs by mouth 4 (four) times daily as needed. 118 mL 0  . rizatriptan (MAXALT) 5 MG tablet TAKE 1 TABLET BY MOUTH AS NEEDED FOR MIGRAINE. MAY REPEAT IN 2 HOURS IF NEEDED 10 tablet 0  . vitamin B-12 (CYANOCOBALAMIN) 1000 MCG tablet Take 1,000 mcg by mouth daily.     No current facility-administered medications on file prior to visit.     PAST MEDICAL HISTORY: Past Medical History:  Diagnosis Date  . Anemia   . Back pain   . 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 History of chemotherapy 03/23/12- 05/10/12   s/p 4 cycles of FEC   . Hx: UTI (urinary tract infection)   . Knee pain   . Lactose intolerance   . Neck pain   . S/P radiation therapy 07/10/12-08/23/12   Left Breast: 50 Gy/25 Fractions; Boost: 10 Gy/5 Fractions  . Status post chemotherapy 1 dose 05/23/12   Stopped after one cycle of Taxol due to Grade 2 Toxicity  . Status post chemotherapy 1 dose 05/23/12   Stopped  after one cycle of Taxol due to Grade 2 Toxicity    PAST SURGICAL HISTORY: Past Surgical History:  Procedure Laterality Date  . AXILLARY SURGERY  03/06/12   left, regional resection, 1/3 nodes pos  . BACK SURGERY     LUMBAR DECOMPRESSION  . BREAST BIOPSY  01/09/2012   left breast 3 0'clock/ER?PR =positive,her 2 neg  . BREAST LUMPECTOMY Left 02/10/12   Left Breast: 2 Foci of Invasive Ductal Caand High grade Ductal Carcinoma In Situ, 0/14 nodes Left Axilla Negative : Regional Resection of Lymph Nodes: 0/5 Nodes Negative  . FOOT SURGERY     multiple  . INTRAUTERINE DEVICE REMOVAL  01/24/12  . needle core Biospy  01/09/12   Left Breast: Invasive Ductal Carcinoma, Lymph Node Axilla: Metastatic Mammary Carcinoma  . PORTACATH PLACEMENT  02/10/2012   Procedure: INSERTION PORT-A-CATH;  Surgeon: Rolm Bookbinder, MD;  Location: [facility];  Service: General;  Laterality: Right;  .  Regional Resection  03/06/12   Left Axilla: 1/3 Nodes Metastatic Carcinoma  . SPINE SURGERY  01/2011    SOCIAL HISTORY: Social History   Tobacco Use  . Smoking status: Never Smoker  . Smokeless tobacco: Never Used  Substance Use Topics  . Alcohol use: No  . Drug use: No    FAMILY HISTORY: Family History  Problem Relation Age of Onset  . Diabetes Mother   . Sudden death Mother   . Schizophrenia Mother   . Sleep apnea Mother   . Obesity Mother   . Seizures Son     ROS: Review of Systems  Constitutional: Negative for weight loss.  Gastrointestinal: Negative for nausea and vomiting.  Musculoskeletal:       Negative for muscle weakness    PHYSICAL EXAM: Pt in no acute distress  RECENT LABS AND TESTS: BMET    Component Value Date/Time   NA 139 12/10/2018 1151   NA 140 12/25/2014 1349   K 4.6 12/10/2018 1151   K 4.4 12/25/2014 1349   CL 100 12/10/2018 1151   CL 105 12/21/2012 1527   CO2 23 12/10/2018 1151   CO2 27 12/25/2014 1349   GLUCOSE 71 12/10/2018 1151   GLUCOSE 71  12/25/2014 1349   GLUCOSE 75 12/21/2012 1527   BUN 15 12/10/2018 1151   BUN 22.5 12/25/2014 1349   CREATININE 0.81 12/10/2018 1151   CREATININE 0.9 12/25/2014 1349   CALCIUM 10.5 (H) 12/10/2018 1151   CALCIUM 10.2 12/25/2014 1349   GFRNONAA 83 12/10/2018 1151   GFRAA 96 12/10/2018 1151   Lab Results  Component Value Date   HGBA1C 4.9 12/10/2018   Lab Results  Component Value Date   INSULIN 16.6 12/10/2018   CBC    Component Value Date/Time   WBC 4.9 12/10/2018 1151   WBC 5.1 12/25/2014 1348   WBC 4.0 08/14/2014 0933   RBC 4.27 12/10/2018 1151   RBC 4.03 12/25/2014 1348   RBC 4.32 08/14/2014 0933   HGB 12.4 12/10/2018 1151   HGB 11.7 12/25/2014 1348   HCT 37.3 12/10/2018 1151   HCT 35.9 12/25/2014 1348   PLT 175 12/25/2014 1348   MCV 87 12/10/2018 1151   MCV 89.1 12/25/2014 1348   MCH 29.0 12/10/2018 1151   MCH 29.0 12/25/2014 1348   MCH 27.1 02/09/2012 0900   MCHC 33.2 12/10/2018 1151   MCHC 32.6 12/25/2014 1348   MCHC 33.1 08/14/2014 0933   RDW 12.2 12/10/2018 1151   RDW 12.6 12/25/2014 1348   LYMPHSABS 1.3 12/10/2018 1151   LYMPHSABS 1.6 12/25/2014 1348   MONOABS 0.3 12/25/2014 1348   EOSABS 0.1 12/10/2018 1151   BASOSABS 0.1 12/10/2018 1151   BASOSABS 0.0 12/25/2014 1348   Iron/TIBC/Ferritin/ %Sat No results found for: IRON, TIBC, FERRITIN, IRONPCTSAT Lipid Panel     Component Value Date/Time   CHOL 202 (H) 12/10/2018 1151   TRIG 61 12/10/2018 1151   HDL 57 12/10/2018 1151   CHOLHDL 3 08/14/2014 0933   VLDL 9.6 08/14/2014 0933   LDLCALC 133 (H) 12/10/2018 1151   Hepatic Function Panel     Component Value Date/Time   PROT 7.2 12/10/2018 1151   PROT 6.9 12/25/2014 1349   ALBUMIN 4.5 12/10/2018 1151   ALBUMIN 3.7 12/25/2014 1349   AST 18 12/10/2018 1151   AST 19 12/25/2014 1349   ALT 16 12/10/2018 1151   ALT 19 12/25/2014 1349   ALKPHOS 117 12/10/2018 1151   ALKPHOS 116 12/25/2014 1349  BILITOT 0.3 12/10/2018 1151   BILITOT 0.28 12/25/2014  1349   BILIDIR 0.1 08/14/2014 0933      Component Value Date/Time   TSH 4.760 (H) 12/10/2018 1151   TSH 3.50 08/14/2014 0933   TSH 3.14 09/20/2011 1558     Ref. Range 12/10/2018 11:51  Vitamin D, 25-Hydroxy Latest Ref Range: 30.0 - 100.0 ng/mL 43.1    I, Doreene Nest, am acting as Location manager for [employer]. Owens Shark, DO   I have reviewed the above documentation for accuracy and completeness, and I agree with the above. -Jearld Lesch, DO

## 2019-06-20 ENCOUNTER — Ambulatory Visit (INDEPENDENT_AMBULATORY_CARE_PROVIDER_SITE_OTHER): Payer: BC Managed Care – PPO | Admitting: Bariatrics

## 2019-06-25 ENCOUNTER — Encounter: Payer: Self-pay | Admitting: Family Medicine

## 2019-07-02 ENCOUNTER — Other Ambulatory Visit: Payer: Self-pay

## 2019-07-02 ENCOUNTER — Telehealth (INDEPENDENT_AMBULATORY_CARE_PROVIDER_SITE_OTHER): Payer: BC Managed Care – PPO | Admitting: Bariatrics

## 2019-07-02 DIAGNOSIS — E669 Obesity, unspecified: Secondary | ICD-10-CM

## 2019-07-02 DIAGNOSIS — E8881 Metabolic syndrome: Secondary | ICD-10-CM

## 2019-07-02 DIAGNOSIS — Z6834 Body mass index (BMI) 34.0-34.9, adult: Secondary | ICD-10-CM | POA: Diagnosis not present

## 2019-07-02 DIAGNOSIS — E559 Vitamin D deficiency, unspecified: Secondary | ICD-10-CM | POA: Diagnosis not present

## 2019-07-04 NOTE — Progress Notes (Signed)
Office: [phone]  /  Fax: [phone] TeleHealth Visit:  Alisha Chen has verbally consented to this TeleHealth visit today. The patient is located at home, the provider is located at the [employer] [facility] office. The participants in this visit include the listed provider and patient and any and all parties involved. The visit was conducted today via FaceTime.  HPI:   Chief Complaint: OBESITY Alisha Chen is here to discuss her progress with her obesity treatment plan. She is on the keep a food journal with 1150 to 1250 calories and 80 to 85 grams of protein daily plan and is following her eating plan approximately 60 to 70 % of the time. She states she is walking 60 minutes 5 times per week. Alisha Chen states that she has lost 1/2 pound since her last visit (weight 198 lbs 07/02/19). She has been under some stress. Alisha Chen is doing well with her journaling and she is drinking adequate water. We were unable to weigh the patient today for this TeleHealth visit. She feels as if she has lost weight since her last visit. She has lost 3 lbs since starting treatment with [place].  Insulin Resistance Alisha Chen has a diagnosis of insulin resistance based on her elevated fasting insulin level >5. Although Alisha Chen's blood glucose readings are still under good control, insulin resistance puts her at greater risk of metabolic syndrome and diabetes. Her last A1c was at 4.7 and last insulin level was at 16.6 She is taking metformin currently and continues to work on diet and exercise to decrease risk of diabetes.  Vitamin D deficiency Alisha Chen has a diagnosis of vitamin D deficiency. She is currently taking OTC vit D and denies nausea, vomiting or muscle weakness.  ASSESSMENT AND PLAN:  Insulin resistance  Vitamin D deficiency  Class 1 obesity with serious comorbidity and body mass index (BMI) of 34.0 to 34.9 in adult, unspecified obesity type  PLAN:  Insulin Resistance Alisha Chen will continue to work on weight  loss, exercise, and decreasing simple carbohydrates in her diet to help decrease the risk of diabetes. We dicussed metformin including benefits and risks. She was informed that eating too many simple carbohydrates or too many calories at one sitting increases the likelihood of GI side effects. Alisha Chen will continue metformin for now and prescription was not written today. Alisha Chen agreed to follow up with [place] as directed to monitor her progress.  Vitamin D Deficiency Alisha Chen was informed that low vitamin D levels contributes to fatigue and are associated with obesity, breast, and colon cancer. Alisha Chen will continue to take OTC Vit D and she will follow up for routine testing of vitamin D, at least 2-3 times per year. She was informed of the risk of over-replacement of vitamin D and agrees to not increase her dose unless she discusses this with [place] first.  Obesity Alisha Chen is currently in the action stage of change. As such, her goal is to continue with weight loss efforts She has agreed to keep a food journal with 1150 to 1250 calories and 80 to 85 protein daily. She will ignore any additional calories that are allotted on her Fitbit in relation to exercise.  Alisha Chen will continue her exercise regimen for weight loss and overall health benefits, and she will stay consistent. We discussed the following Behavioral Modification Strategies today: planning for success, increase H2O intake,no skipping meals, increasing lean protein intake, decreasing simple carbohydrates, increasing vegetables, decrease eating out and work on meal planning and intentional eating Alisha Chen will ignore  any exercise; do not increase calories with increased exercise.  Alisha Chen has agreed to follow up with our clinic in 2 weeks. She was informed of the importance of frequent follow up visits to maximize her success with intensive lifestyle modifications for her multiple health conditions.  ALLERGIES: Allergies  Allergen Reactions  . Penicillins  Other (See Comments)    Not sure, but was told by mother never to take this medication as a child  . Shellfish-Derived Products Other (See Comments)    Welts  . Clarithromycin Other (See Comments)    REACTION: NAUSEA,WEAK,BITTER TASTE  . Ondansetron Nausea And Vomiting    Headaches    MEDICATIONS: Current Outpatient Medications on File Prior to Visit  Medication Sig Dispense Refill  . celecoxib (CELEBREX) 100 MG capsule Take 100 mg by mouth 2 (two) times daily.    . cholecalciferol (VITAMIN D) 1000 UNITS tablet Take 1,000 Units by mouth daily.    Marland Kitchen ibuprofen (ADVIL,MOTRIN) 800 MG tablet Take 1 tablet (800 mg total) by mouth 3 (three) times daily. 21 tablet 0  . metFORMIN (GLUCOPHAGE) 500 MG tablet Take 1 tablet (500 mg total) by mouth daily with breakfast. 30 tablet 0  . Misc Natural Products (COLON CLEANSE PO) Take by mouth 3 (three) times daily. 3 pills nightly qhs    . promethazine-dextromethorphan (PROMETHAZINE-DM) 6.25-15 MG/5ML syrup Take 5 mLs by mouth 4 (four) times daily as needed. 118 mL 0  . rizatriptan (MAXALT) 5 MG tablet TAKE 1 TABLET BY MOUTH AS NEEDED FOR MIGRAINE. MAY REPEAT IN 2 HOURS IF NEEDED 10 tablet 0  . vitamin B-12 (CYANOCOBALAMIN) 1000 MCG tablet Take 1,000 mcg by mouth daily.     No current facility-administered medications on file prior to visit.     PAST MEDICAL HISTORY: Past Medical History:  Diagnosis Date  . Anemia   . Back pain   . 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 History of chemotherapy 03/23/12- 05/10/12   s/p 4 cycles of FEC   . Hx: UTI (urinary tract infection)   . Knee pain   . Lactose intolerance   . Neck pain   . S/P radiation therapy 07/10/12-08/23/12   Left Breast: 50 Gy/25 Fractions; Boost: 10 Gy/5 Fractions  . Status post chemotherapy 1 dose 05/23/12   Stopped after one cycle of Taxol due to Grade 2 Toxicity  . Status post chemotherapy 1 dose 05/23/12   Stopped after one cycle of Taxol due  to Grade 2 Toxicity    PAST SURGICAL HISTORY: Past Surgical History:  Procedure Laterality Date  . AXILLARY SURGERY  03/06/12   left, regional resection, 1/3 nodes pos  . BACK SURGERY     LUMBAR DECOMPRESSION  . BREAST BIOPSY  01/09/2012   left breast 3 0'clock/ER?PR =positive,her 2 neg  . BREAST LUMPECTOMY Left 02/10/12   Left Breast: 2 Foci of Invasive Ductal Caand High grade Ductal Carcinoma In Situ, 0/14 nodes Left Axilla Negative : Regional Resection of Lymph Nodes: 0/5 Nodes Negative  . FOOT SURGERY     multiple  . INTRAUTERINE DEVICE REMOVAL  01/24/12  . needle core Biospy  01/09/12   Left Breast: Invasive Ductal Carcinoma, Lymph Node Axilla: Metastatic Mammary Carcinoma  . PORTACATH PLACEMENT  02/10/2012   Procedure: INSERTION PORT-A-CATH;  Surgeon: Rolm Bookbinder, MD;  Location: [place];  Service: General;  Laterality: Right;  . Regional Resection  03/06/12   Left Axilla: 1/3 Nodes Metastatic Carcinoma  .  SPINE SURGERY  01/2011    SOCIAL HISTORY: Social History   Tobacco Use  . Smoking status: Never Smoker  . Smokeless tobacco: Never Used  Substance Use Topics  . Alcohol use: No  . Drug use: No    FAMILY HISTORY: Family History  Problem Relation Age of Onset  . Diabetes Mother   . Sudden death Mother   . Schizophrenia Mother   . Sleep apnea Mother   . Obesity Mother   . Seizures Son     ROS: Review of Systems  Constitutional: Positive for weight loss.  Gastrointestinal: Negative for nausea and vomiting.  Musculoskeletal:       Negative for muscle weakness    PHYSICAL EXAM: Pt in no acute distress  RECENT LABS AND TESTS: BMET    Component Value Date/Time   NA 139 12/10/2018 1151   NA 140 12/25/2014 1349   K 4.6 12/10/2018 1151   K 4.4 12/25/2014 1349   CL 100 12/10/2018 1151   CL 105 12/21/2012 1527   CO2 23 12/10/2018 1151   CO2 27 12/25/2014 1349   GLUCOSE 71 12/10/2018 1151   GLUCOSE 71 12/25/2014 1349   GLUCOSE 75  12/21/2012 1527   BUN 15 12/10/2018 1151   BUN 22.5 12/25/2014 1349   CREATININE 0.81 12/10/2018 1151   CREATININE 0.9 12/25/2014 1349   CALCIUM 10.5 (H) 12/10/2018 1151   CALCIUM 10.2 12/25/2014 1349   GFRNONAA 83 12/10/2018 1151   GFRAA 96 12/10/2018 1151   Lab Results  Component Value Date   HGBA1C 4.9 12/10/2018   Lab Results  Component Value Date   INSULIN 16.6 12/10/2018   CBC    Component Value Date/Time   WBC 4.9 12/10/2018 1151   WBC 5.1 12/25/2014 1348   WBC 4.0 08/14/2014 0933   RBC 4.27 12/10/2018 1151   RBC 4.03 12/25/2014 1348   RBC 4.32 08/14/2014 0933   HGB 12.4 12/10/2018 1151   HGB 11.7 12/25/2014 1348   HCT 37.3 12/10/2018 1151   HCT 35.9 12/25/2014 1348   PLT 175 12/25/2014 1348   MCV 87 12/10/2018 1151   MCV 89.1 12/25/2014 1348   MCH 29.0 12/10/2018 1151   MCH 29.0 12/25/2014 1348   MCH 27.1 02/09/2012 0900   MCHC 33.2 12/10/2018 1151   MCHC 32.6 12/25/2014 1348   MCHC 33.1 08/14/2014 0933   RDW 12.2 12/10/2018 1151   RDW 12.6 12/25/2014 1348   LYMPHSABS 1.3 12/10/2018 1151   LYMPHSABS 1.6 12/25/2014 1348   MONOABS 0.3 12/25/2014 1348   EOSABS 0.1 12/10/2018 1151   BASOSABS 0.1 12/10/2018 1151   BASOSABS 0.0 12/25/2014 1348   Iron/TIBC/Ferritin/ %Sat No results found for: IRON, TIBC, FERRITIN, IRONPCTSAT Lipid Panel     Component Value Date/Time   CHOL 202 (H) 12/10/2018 1151   TRIG 61 12/10/2018 1151   HDL 57 12/10/2018 1151   CHOLHDL 3 08/14/2014 0933   VLDL 9.6 08/14/2014 0933   LDLCALC 133 (H) 12/10/2018 1151   Hepatic Function Panel     Component Value Date/Time   PROT 7.2 12/10/2018 1151   PROT 6.9 12/25/2014 1349   ALBUMIN 4.5 12/10/2018 1151   ALBUMIN 3.7 12/25/2014 1349   AST 18 12/10/2018 1151   AST 19 12/25/2014 1349   ALT 16 12/10/2018 1151   ALT 19 12/25/2014 1349   ALKPHOS 117 12/10/2018 1151   ALKPHOS 116 12/25/2014 1349   BILITOT 0.3 12/10/2018 1151   BILITOT 0.28 12/25/2014 1349   BILIDIR 0.1  08/14/2014  0933      Component Value Date/Time   TSH 4.760 (H) 12/10/2018 1151   TSH 3.50 08/14/2014 0933   TSH 3.14 09/20/2011 1558     Ref. Range 12/10/2018 11:51  Vitamin D, 25-Hydroxy Latest Ref Range: 30.0 - 100.0 ng/mL 43.1    I, Doreene Nest, am acting as Location manager for [employer]. Owens Shark, DO  I have reviewed the above documentation for accuracy and completeness, and I agree with the above. -Jearld Lesch, DO

## 2019-07-11 ENCOUNTER — Other Ambulatory Visit: Payer: Self-pay | Admitting: Family Medicine

## 2019-07-11 DIAGNOSIS — G43909 Migraine, unspecified, not intractable, without status migrainosus: Secondary | ICD-10-CM

## 2019-07-16 ENCOUNTER — Encounter (INDEPENDENT_AMBULATORY_CARE_PROVIDER_SITE_OTHER): Payer: Self-pay | Admitting: Family Medicine

## 2019-07-16 ENCOUNTER — Telehealth (INDEPENDENT_AMBULATORY_CARE_PROVIDER_SITE_OTHER): Payer: BC Managed Care – PPO | Admitting: Family Medicine

## 2019-07-16 ENCOUNTER — Telehealth (INDEPENDENT_AMBULATORY_CARE_PROVIDER_SITE_OTHER): Payer: BC Managed Care – PPO | Admitting: Bariatrics

## 2019-07-16 ENCOUNTER — Other Ambulatory Visit: Payer: Self-pay

## 2019-07-16 DIAGNOSIS — E559 Vitamin D deficiency, unspecified: Secondary | ICD-10-CM

## 2019-07-16 DIAGNOSIS — E669 Obesity, unspecified: Secondary | ICD-10-CM | POA: Diagnosis not present

## 2019-07-16 DIAGNOSIS — Z6834 Body mass index (BMI) 34.0-34.9, adult: Secondary | ICD-10-CM | POA: Diagnosis not present

## 2019-07-16 NOTE — Progress Notes (Signed)
Office: [phone]  /  Fax: [phone] TeleHealth Visit:  Alisha Chen has verbally consented to this TeleHealth visit today. The patient is located at work, the provider is located at the [employer] [facility] office. The participants in this visit include the listed provider and patient. The visit was conducted today via FaceTime.  HPI:   Chief Complaint: OBESITY Alisha Chen is here to discuss her progress with her obesity treatment plan. She is on the Category 2 plan and is following her eating plan approximately 50% of the time. She states she is walking 60 minutes 3 times weekly. Kylaya reports that she tends to self-sabotage when she loses weight and be more lax with her diet. She is weighing daily which causes frustration due to weight fluctuations.. We were unable to weigh the patient today for this TeleHealth visit. She feels as if she has lost 8 lbs since her last visit. She has lost 6 lbs since starting treatment with [place].  Vitamin D deficiency Alisha Chen has a diagnosis of Vitamin D deficiency, which is not at goal. Last Vitamin D was 43.1 on 12/10/2018. She is currently taking prescription Vit D and denies nausea, vomiting or muscle weakness.  ASSESSMENT AND PLAN:  Vitamin D deficiency  Class 1 obesity with serious comorbidity and body mass index (BMI) of 34.0 to 34.9 in adult, unspecified obesity type  PLAN:  Vitamin D Deficiency Alisha Chen was informed that low Vitamin D levels contributes to fatigue and are associated with obesity, breast, and colon cancer. She agrees to continue to take prescription Vit D and will follow-up for routine testing of Vitamin D, at least 2-3 times per year. She was informed of the risk of over-replacement of Vitamin D and agrees to not increase her dose unless she discusses this with [place] first. Alisha Chen agrees to follow-up with our clinic in 2 weeks.  Obesity Alisha Chen is currently in the action stage of change. As such, her goal is to continue with  weight loss efforts. She has agreed to keep a food journal with 1150-1250 calories and 80-85 grams of protein. I suggested to Alisha Chen to have visits with Dr. Mallie Mussel and she will consider this. Alisha Chen was instructed to weigh once weekly only.  Alisha Chen has been instructed to continue her current exercise regimen for weight loss and overall health benefits. We discussed the following Behavioral Modification Strategies today: increasing lean protein intake, planning for success, and keep a strict food journal.  Alisha Chen has agreed to follow-up with our clinic in 2 weeks. She was informed of the importance of frequent follow-up visits to maximize her success with intensive lifestyle modifications for her multiple health conditions.  ALLERGIES: Allergies  Allergen Reactions  . Penicillins Other (See Comments)    Not sure, but was told by mother never to take this medication as a child  . Shellfish-Derived Products Other (See Comments)    Welts  . Clarithromycin Other (See Comments)    REACTION: NAUSEA,WEAK,BITTER TASTE  . Ondansetron Nausea And Vomiting    Headaches    MEDICATIONS: Current Outpatient Medications on File Prior to Visit  Medication Sig Dispense Refill  . celecoxib (CELEBREX) 100 MG capsule Take 100 mg by mouth 2 (two) times daily.    . cholecalciferol (VITAMIN D) 1000 UNITS tablet Take 1,000 Units by mouth daily.    Marland Kitchen ibuprofen (ADVIL,MOTRIN) 800 MG tablet Take 1 tablet (800 mg total) by mouth 3 (three) times daily. 21 tablet 0  . metFORMIN (GLUCOPHAGE) 500 MG tablet Take  1 tablet (500 mg total) by mouth daily with breakfast. 30 tablet 0  . Misc Natural Products (COLON CLEANSE PO) Take by mouth 3 (three) times daily. 3 pills nightly qhs    . promethazine-dextromethorphan (PROMETHAZINE-DM) 6.25-15 MG/5ML syrup Take 5 mLs by mouth 4 (four) times daily as needed. 118 mL 0  . rizatriptan (MAXALT) 5 MG tablet TAKE 1 TABLET BY MOUTH AS NEEDED FOR MIGRAINE. MAY REPEAT IN 2 HOURS IF NEEDED 10  tablet 1  . vitamin B-12 (CYANOCOBALAMIN) 1000 MCG tablet Take 1,000 mcg by mouth daily.     No current facility-administered medications on file prior to visit.     PAST MEDICAL HISTORY: Past Medical History:  Diagnosis Date  . Anemia   . Back pain   . 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 History of chemotherapy 03/23/12- 05/10/12   s/p 4 cycles of FEC   . Hx: UTI (urinary tract infection)   . Knee pain   . Lactose intolerance   . Neck pain   . S/P radiation therapy 07/10/12-08/23/12   Left Breast: 50 Gy/25 Fractions; Boost: 10 Gy/5 Fractions  . Status post chemotherapy 1 dose 05/23/12   Stopped after one cycle of Taxol due to Grade 2 Toxicity  . Status post chemotherapy 1 dose 05/23/12   Stopped after one cycle of Taxol due to Grade 2 Toxicity    PAST SURGICAL HISTORY: Past Surgical History:  Procedure Laterality Date  . AXILLARY SURGERY  03/06/12   left, regional resection, 1/3 nodes pos  . BACK SURGERY     LUMBAR DECOMPRESSION  . BREAST BIOPSY  01/09/2012   left breast 3 0'clock/ER?PR =positive,her 2 neg  . BREAST LUMPECTOMY Left 02/10/12   Left Breast: 2 Foci of Invasive Ductal Caand High grade Ductal Carcinoma In Situ, 0/14 nodes Left Axilla Negative : Regional Resection of Lymph Nodes: 0/5 Nodes Negative  . FOOT SURGERY     multiple  . INTRAUTERINE DEVICE REMOVAL  01/24/12  . needle core Biospy  01/09/12   Left Breast: Invasive Ductal Carcinoma, Lymph Node Axilla: Metastatic Mammary Carcinoma  . PORTACATH PLACEMENT  02/10/2012   Procedure: INSERTION PORT-A-CATH;  Surgeon: Rolm Bookbinder, MD;  Location: [facility];  Service: General;  Laterality: Right;  . Regional Resection  03/06/12   Left Axilla: 1/3 Nodes Metastatic Carcinoma  . SPINE SURGERY  01/2011    SOCIAL HISTORY: Social History   Tobacco Use  . Smoking status: Never Smoker  . Smokeless tobacco: Never Used  Substance Use Topics  . Alcohol use: No   . Drug use: No    FAMILY HISTORY: Family History  Problem Relation Age of Onset  . Diabetes Mother   . Sudden death Mother   . Schizophrenia Mother   . Sleep apnea Mother   . Obesity Mother   . Seizures Son    ROS: Review of Systems  Gastrointestinal: Negative for nausea and vomiting.  Musculoskeletal:       Negative for muscle weakness.   PHYSICAL EXAM: Pt in no acute distress  RECENT LABS AND TESTS: BMET    Component Value Date/Time   NA 139 12/10/2018 1151   NA 140 12/25/2014 1349   K 4.6 12/10/2018 1151   K 4.4 12/25/2014 1349   CL 100 12/10/2018 1151   CL 105 12/21/2012 1527   CO2 23 12/10/2018 1151   CO2 27 12/25/2014 1349   GLUCOSE 71 12/10/2018 1151  GLUCOSE 71 12/25/2014 1349   GLUCOSE 75 12/21/2012 1527   BUN 15 12/10/2018 1151   BUN 22.5 12/25/2014 1349   CREATININE 0.81 12/10/2018 1151   CREATININE 0.9 12/25/2014 1349   CALCIUM 10.5 (H) 12/10/2018 1151   CALCIUM 10.2 12/25/2014 1349   GFRNONAA 83 12/10/2018 1151   GFRAA 96 12/10/2018 1151   Lab Results  Component Value Date   HGBA1C 4.9 12/10/2018   Lab Results  Component Value Date   INSULIN 16.6 12/10/2018   CBC    Component Value Date/Time   WBC 4.9 12/10/2018 1151   WBC 5.1 12/25/2014 1348   WBC 4.0 08/14/2014 0933   RBC 4.27 12/10/2018 1151   RBC 4.03 12/25/2014 1348   RBC 4.32 08/14/2014 0933   HGB 12.4 12/10/2018 1151   HGB 11.7 12/25/2014 1348   HCT 37.3 12/10/2018 1151   HCT 35.9 12/25/2014 1348   PLT 175 12/25/2014 1348   MCV 87 12/10/2018 1151   MCV 89.1 12/25/2014 1348   MCH 29.0 12/10/2018 1151   MCH 29.0 12/25/2014 1348   MCH 27.1 02/09/2012 0900   MCHC 33.2 12/10/2018 1151   MCHC 32.6 12/25/2014 1348   MCHC 33.1 08/14/2014 0933   RDW 12.2 12/10/2018 1151   RDW 12.6 12/25/2014 1348   LYMPHSABS 1.3 12/10/2018 1151   LYMPHSABS 1.6 12/25/2014 1348   MONOABS 0.3 12/25/2014 1348   EOSABS 0.1 12/10/2018 1151   BASOSABS 0.1 12/10/2018 1151   BASOSABS 0.0  12/25/2014 1348   Iron/TIBC/Ferritin/ %Sat No results found for: IRON, TIBC, FERRITIN, IRONPCTSAT Lipid Panel     Component Value Date/Time   CHOL 202 (H) 12/10/2018 1151   TRIG 61 12/10/2018 1151   HDL 57 12/10/2018 1151   CHOLHDL 3 08/14/2014 0933   VLDL 9.6 08/14/2014 0933   LDLCALC 133 (H) 12/10/2018 1151   Hepatic Function Panel     Component Value Date/Time   PROT 7.2 12/10/2018 1151   PROT 6.9 12/25/2014 1349   ALBUMIN 4.5 12/10/2018 1151   ALBUMIN 3.7 12/25/2014 1349   AST 18 12/10/2018 1151   AST 19 12/25/2014 1349   ALT 16 12/10/2018 1151   ALT 19 12/25/2014 1349   ALKPHOS 117 12/10/2018 1151   ALKPHOS 116 12/25/2014 1349   BILITOT 0.3 12/10/2018 1151   BILITOT 0.28 12/25/2014 1349   BILIDIR 0.1 08/14/2014 0933      Component Value Date/Time   TSH 4.760 (H) 12/10/2018 1151   TSH 3.50 08/14/2014 0933   TSH 3.14 09/20/2011 1558   Results for ADALINA, DARCEY (MRN [id number]) as of 07/16/2019 16:41  Ref. Range 12/10/2018 11:51  Vitamin D, 25-Hydroxy Latest Ref Range: 30.0 - 100.0 ng/mL 43.1   I, Michaelene Song, am acting as Location manager for [employer], FNP  I have reviewed the above documentation for accuracy and completeness, and I agree with the above.  - Sharice Harriss, FNP-C.

## 2019-07-30 ENCOUNTER — Telehealth (INDEPENDENT_AMBULATORY_CARE_PROVIDER_SITE_OTHER): Payer: BC Managed Care – PPO | Admitting: Family Medicine

## 2019-07-30 ENCOUNTER — Encounter (INDEPENDENT_AMBULATORY_CARE_PROVIDER_SITE_OTHER): Payer: Self-pay | Admitting: Family Medicine

## 2019-07-30 ENCOUNTER — Other Ambulatory Visit: Payer: Self-pay

## 2019-07-30 DIAGNOSIS — E8881 Metabolic syndrome: Secondary | ICD-10-CM

## 2019-07-30 DIAGNOSIS — E669 Obesity, unspecified: Secondary | ICD-10-CM

## 2019-07-30 DIAGNOSIS — Z6831 Body mass index (BMI) 31.0-31.9, adult: Secondary | ICD-10-CM

## 2019-07-30 DIAGNOSIS — E88819 Insulin resistance, unspecified: Secondary | ICD-10-CM

## 2019-07-31 NOTE — Progress Notes (Signed)
Office: [phone]  /  Fax: [phone] TeleHealth Visit:  Alisha Chen has verbally consented to this TeleHealth visit today. The patient is located at home, the provider is located at the [employer] [facility] office. The participants in this visit include the listed provider and patient. The visit was conducted today via face time.  HPI:   Chief Complaint: OBESITY Alisha Chen is here to discuss her progress with her obesity treatment plan. She is on the keep a food journal with 1150-1250 calories and 80-85 grams of protein daily and is following her eating plan approximately 60-70 % of the time. She states she is on the elliptical and walking for 1-3 minutes 3-4 times per week. [name] has gained 3 lbs since her last visit. She is sometimes only getting 70 grams of protein daily. She is drinking plenty of water. Some days she walks for 3 hours. She reports her weight today at 201 lbs.  We were unable to weigh the patient today for this TeleHealth visit. She feels as if she has gained 3 lbs since her last visit. She has lost 6 lbs since starting treatment with [place].  Insulin Resistance Alisha Chen has a diagnosis of insulin resistance based on her elevated fasting insulin level >5. Although Alisha Chen's blood glucose readings are still under good control, insulin resistance puts her at greater risk of metabolic syndrome and diabetes. She is taking metformin q AM. She denies polyphagia, but she does get hungry on the weekends. She continues to work on diet and exercise to decrease risk of diabetes. Lab Results  Component Value Date   HGBA1C 4.9 12/10/2018    ASSESSMENT AND PLAN:  Insulin resistance  Class 1 obesity with serious comorbidity and body mass index (BMI) of 34.0 to 34.9 in adult, unspecified obesity type  PLAN:  Insulin Resistance Alisha Chen will continue to work on weight loss, exercise, and decreasing simple carbohydrates in her diet to help decrease the risk of diabetes. We dicussed  metformin including benefits and risks. She was informed that eating too many simple carbohydrates or too many calories at one sitting increases the likelihood of GI side effects. Alisha Chen agrees to continue taking metformin, and she agrees to follow up with our clinic in 2 weeks as directed to monitor her progress.  Obesity Alisha Chen is currently in the action stage of change. As such, her goal is to continue with weight loss efforts She has agreed to keep a food journal with 1150-1250 calories and 80-85 grams of protein daily or follow the Category 2 plan Alisha Chen is to try to get 20 grams of protein in at breakfast, 30 grams of protein at lunch, and 30 grams of protein at dinner. She may add in extra protein shake if she is hungry after walking 3 hours. Alisha Chen has been instructed to work up to a goal of 150 minutes of combined cardio and strengthening exercise per week or continue current regimen for weight loss and overall health benefits. We discussed the following Behavioral Modification Strategies today: increasing lean protein intake, better snacking choices, planning for success, and keep a strict food journal   Alisha Chen has agreed to follow up with our clinic in 2 weeks. She was informed of the importance of frequent follow up visits to maximize her success with intensive lifestyle modifications for her multiple health conditions.  ALLERGIES: Allergies  Allergen Reactions  . Penicillins Other (See Comments)    Not sure, but was told by mother never to take this medication as  a child  . Shellfish-Derived Products Other (See Comments)    Welts  . Clarithromycin Other (See Comments)    REACTION: NAUSEA,WEAK,BITTER TASTE  . Ondansetron Nausea And Vomiting    Headaches    MEDICATIONS: Current Outpatient Medications on File Prior to Visit  Medication Sig Dispense Refill  . celecoxib (CELEBREX) 100 MG capsule Take 100 mg by mouth 2 (two) times daily.    . cholecalciferol (VITAMIN D) 1000 UNITS  tablet Take 1,000 Units by mouth daily.    Marland Kitchen ibuprofen (ADVIL,MOTRIN) 800 MG tablet Take 1 tablet (800 mg total) by mouth 3 (three) times daily. 21 tablet 0  . metFORMIN (GLUCOPHAGE) 500 MG tablet Take 1 tablet (500 mg total) by mouth daily with breakfast. 30 tablet 0  . Misc Natural Products (COLON CLEANSE PO) Take by mouth 3 (three) times daily. 3 pills nightly qhs    . promethazine-dextromethorphan (PROMETHAZINE-DM) 6.25-15 MG/5ML syrup Take 5 mLs by mouth 4 (four) times daily as needed. 118 mL 0  . rizatriptan (MAXALT) 5 MG tablet TAKE 1 TABLET BY MOUTH AS NEEDED FOR MIGRAINE. MAY REPEAT IN 2 HOURS IF NEEDED 10 tablet 1  . vitamin B-12 (CYANOCOBALAMIN) 1000 MCG tablet Take 1,000 mcg by mouth daily.     No current facility-administered medications on file prior to visit.     PAST MEDICAL HISTORY: Past Medical History:  Diagnosis Date  . Anemia   . Back pain   . 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 History of chemotherapy 03/23/12- 05/10/12   s/p 4 cycles of FEC   . Hx: UTI (urinary tract infection)   . Knee pain   . Lactose intolerance   . Neck pain   . S/P radiation therapy 07/10/12-08/23/12   Left Breast: 50 Gy/25 Fractions; Boost: 10 Gy/5 Fractions  . Status post chemotherapy 1 dose 05/23/12   Stopped after one cycle of Taxol due to Grade 2 Toxicity  . Status post chemotherapy 1 dose 05/23/12   Stopped after one cycle of Taxol due to Grade 2 Toxicity    PAST SURGICAL HISTORY: Past Surgical History:  Procedure Laterality Date  . AXILLARY SURGERY  03/06/12   left, regional resection, 1/3 nodes pos  . BACK SURGERY     LUMBAR DECOMPRESSION  . BREAST BIOPSY  01/09/2012   left breast 3 0'clock/ER?PR =positive,her 2 neg  . BREAST LUMPECTOMY Left 02/10/12   Left Breast: 2 Foci of Invasive Ductal Caand High grade Ductal Carcinoma In Situ, 0/14 nodes Left Axilla Negative : Regional Resection of Lymph Nodes: 0/5 Nodes Negative  . FOOT SURGERY      multiple  . INTRAUTERINE DEVICE REMOVAL  01/24/12  . needle core Biospy  01/09/12   Left Breast: Invasive Ductal Carcinoma, Lymph Node Axilla: Metastatic Mammary Carcinoma  . PORTACATH PLACEMENT  02/10/2012   Procedure: INSERTION PORT-A-CATH;  Surgeon: Rolm Bookbinder, MD;  Location: [facility];  Service: General;  Laterality: Right;  . Regional Resection  03/06/12   Left Axilla: 1/3 Nodes Metastatic Carcinoma  . SPINE SURGERY  01/2011    SOCIAL HISTORY: Social History   Tobacco Use  . Smoking status: Never Smoker  . Smokeless tobacco: Never Used  Substance Use Topics  . Alcohol use: No  . Drug use: No    FAMILY HISTORY: Family History  Problem Relation Age of Onset  . Diabetes Mother   . Sudden death Mother   . Schizophrenia Mother   .  Sleep apnea Mother   . Obesity Mother   . Seizures Son     ROS: Review of Systems  Constitutional: Negative for weight loss.  Endo/Heme/Allergies:       Negative polyphagia    PHYSICAL EXAM: Pt in no acute distress  RECENT LABS AND TESTS: BMET    Component Value Date/Time   NA 139 12/10/2018 1151   NA 140 12/25/2014 1349   K 4.6 12/10/2018 1151   K 4.4 12/25/2014 1349   CL 100 12/10/2018 1151   CL 105 12/21/2012 1527   CO2 23 12/10/2018 1151   CO2 27 12/25/2014 1349   GLUCOSE 71 12/10/2018 1151   GLUCOSE 71 12/25/2014 1349   GLUCOSE 75 12/21/2012 1527   BUN 15 12/10/2018 1151   BUN 22.5 12/25/2014 1349   CREATININE 0.81 12/10/2018 1151   CREATININE 0.9 12/25/2014 1349   CALCIUM 10.5 (H) 12/10/2018 1151   CALCIUM 10.2 12/25/2014 1349   GFRNONAA 83 12/10/2018 1151   GFRAA 96 12/10/2018 1151   Lab Results  Component Value Date   HGBA1C 4.9 12/10/2018   Lab Results  Component Value Date   INSULIN 16.6 12/10/2018   CBC    Component Value Date/Time   WBC 4.9 12/10/2018 1151   WBC 5.1 12/25/2014 1348   WBC 4.0 08/14/2014 0933   RBC 4.27 12/10/2018 1151   RBC 4.03 12/25/2014 1348   RBC 4.32  08/14/2014 0933   HGB 12.4 12/10/2018 1151   HGB 11.7 12/25/2014 1348   HCT 37.3 12/10/2018 1151   HCT 35.9 12/25/2014 1348   PLT 175 12/25/2014 1348   MCV 87 12/10/2018 1151   MCV 89.1 12/25/2014 1348   MCH 29.0 12/10/2018 1151   MCH 29.0 12/25/2014 1348   MCH 27.1 02/09/2012 0900   MCHC 33.2 12/10/2018 1151   MCHC 32.6 12/25/2014 1348   MCHC 33.1 08/14/2014 0933   RDW 12.2 12/10/2018 1151   RDW 12.6 12/25/2014 1348   LYMPHSABS 1.3 12/10/2018 1151   LYMPHSABS 1.6 12/25/2014 1348   MONOABS 0.3 12/25/2014 1348   EOSABS 0.1 12/10/2018 1151   BASOSABS 0.1 12/10/2018 1151   BASOSABS 0.0 12/25/2014 1348   Iron/TIBC/Ferritin/ %Sat No results found for: IRON, TIBC, FERRITIN, IRONPCTSAT Lipid Panel     Component Value Date/Time   CHOL 202 (H) 12/10/2018 1151   TRIG 61 12/10/2018 1151   HDL 57 12/10/2018 1151   CHOLHDL 3 08/14/2014 0933   VLDL 9.6 08/14/2014 0933   LDLCALC 133 (H) 12/10/2018 1151   Hepatic Function Panel     Component Value Date/Time   PROT 7.2 12/10/2018 1151   PROT 6.9 12/25/2014 1349   ALBUMIN 4.5 12/10/2018 1151   ALBUMIN 3.7 12/25/2014 1349   AST 18 12/10/2018 1151   AST 19 12/25/2014 1349   ALT 16 12/10/2018 1151   ALT 19 12/25/2014 1349   ALKPHOS 117 12/10/2018 1151   ALKPHOS 116 12/25/2014 1349   BILITOT 0.3 12/10/2018 1151   BILITOT 0.28 12/25/2014 1349   BILIDIR 0.1 08/14/2014 0933      Component Value Date/Time   TSH 4.760 (H) 12/10/2018 1151   TSH 3.50 08/14/2014 0933   TSH 3.14 09/20/2011 1558      I, Trixie Dredge, am acting as Location manager for [employer], FNP-C  I have reviewed the above documentation for accuracy and completeness, and I agree with the above.  - Maui Britten, FNP-C.

## 2019-08-13 ENCOUNTER — Encounter (INDEPENDENT_AMBULATORY_CARE_PROVIDER_SITE_OTHER): Payer: Self-pay | Admitting: Family Medicine

## 2019-08-13 ENCOUNTER — Other Ambulatory Visit: Payer: Self-pay

## 2019-08-13 ENCOUNTER — Telehealth (INDEPENDENT_AMBULATORY_CARE_PROVIDER_SITE_OTHER): Payer: BC Managed Care – PPO | Admitting: Family Medicine

## 2019-08-13 DIAGNOSIS — Z6831 Body mass index (BMI) 31.0-31.9, adult: Secondary | ICD-10-CM

## 2019-08-13 DIAGNOSIS — E88819 Insulin resistance, unspecified: Secondary | ICD-10-CM

## 2019-08-13 DIAGNOSIS — E669 Obesity, unspecified: Secondary | ICD-10-CM | POA: Diagnosis not present

## 2019-08-13 DIAGNOSIS — E8881 Metabolic syndrome: Secondary | ICD-10-CM

## 2019-08-15 NOTE — Progress Notes (Signed)
Office: [phone]  /  Fax: [phone] TeleHealth Visit:  [name] has verbally consented to this TeleHealth visit today. The patient is located at home, the provider is located at the [employer] [facility] office. The participants in this visit include the listed provider and patient and any and all parties involved. The visit was conducted today via FaceTime.  HPI:   Chief Complaint: OBESITY Alisha Chen is here to discuss her progress with her obesity treatment plan. She is on the  keep a food journal with 1150 to 1250 calories and 80 to 85 grams of protein  and is following her eating plan approximately 80 to 85 % of the time. She states she is exercising on the Elliptical and walking 1 to 3 hours 3 times per week. Alisha Chen is not always getting her protein in or hitting her calorie goal however she does journal consistently. Alisha Chen is eating out at breakfast. We were unable to weigh the patient today for this TeleHealth visit. She feels as if she has gained weight since her last visit (weight 199.8 08/13/19). She has lost 6 lbs since starting treatment with [place].  Insulin Resistance Alisha Chen has a diagnosis of insulin resistance based on her elevated fasting insulin level >5. Although Alisha Chen's blood glucose readings are still under good control, insulin resistance puts her at greater risk of metabolic syndrome and diabetes. She is not taking metformin consistently. Alisha Chen continues to work on diet and exercise to decrease risk of diabetes. Alisha Chen admits polyphagia.  ASSESSMENT AND PLAN:  Insulin resistance  Class 1 obesity with serious comorbidity and body mass index (BMI) of 31.0 to 31.9 in adult, unspecified obesity type  PLAN:  Insulin Resistance Alisha Chen will continue to work on weight loss, exercise, and decreasing simple carbohydrates in her diet to help decrease the risk of diabetes. We dicussed metformin including benefits and risks. She was informed that eating too many simple  carbohydrates or too many calories at one sitting increases the likelihood of GI side effects. Alisha Chen will take metformin with lunch for the afternoon polyphagia. Alisha Chen agreed to follow up with [place] as directed to monitor her progress.  Obesity Alisha Chen is currently in the action stage of change. As such, her goal is to continue with weight loss efforts She has agreed to keep a food journal with 1150 to 1250 calories and 80 to 85 grams of protein daily Alisha Chen has been instructed to work up to a goal of 150 minutes of combined cardio and strengthening exercise per week for weight loss and overall health benefits. We discussed the following Behavioral Modification Strategies today: planning for success, increase H2O intake, keeping healthy foods in the home, keep a strict food journal, increasing lean protein intake and work on meal planning and easy cooking plans  Alisha Chen has agreed to follow up with our clinic in 2 weeks. She was informed of the importance of frequent follow up visits to maximize her success with intensive lifestyle modifications for her multiple health conditions.  ALLERGIES: Allergies  Allergen Reactions   Penicillins Other (See Comments)    Not sure, but was told by mother never to take this medication as a child   Shellfish-Derived Products Other (See Comments)    Welts   Clarithromycin Other (See Comments)    REACTION: NAUSEA,WEAK,BITTER TASTE   Ondansetron Nausea And Vomiting    Headaches    MEDICATIONS: Current Outpatient Medications on File Prior to Visit  Medication Sig Dispense Refill   celecoxib (CELEBREX) 100  MG capsule Take 100 mg by mouth 2 (two) times daily.     cholecalciferol (VITAMIN D) 1000 UNITS tablet Take 1,000 Units by mouth daily.     ibuprofen (ADVIL,MOTRIN) 800 MG tablet Take 1 tablet (800 mg total) by mouth 3 (three) times daily. 21 tablet 0   metFORMIN (GLUCOPHAGE) 500 MG tablet Take 1 tablet (500 mg total) by mouth daily with breakfast. 30  tablet 0   Misc Natural Products (COLON CLEANSE PO) Take by mouth 3 (three) times daily. 3 pills nightly qhs     promethazine-dextromethorphan (PROMETHAZINE-DM) 6.25-15 MG/5ML syrup Take 5 mLs by mouth 4 (four) times daily as needed. 118 mL 0   rizatriptan (MAXALT) 5 MG tablet TAKE 1 TABLET BY MOUTH AS NEEDED FOR MIGRAINE. MAY REPEAT IN 2 HOURS IF NEEDED 10 tablet 1   vitamin B-12 (CYANOCOBALAMIN) 1000 MCG tablet Take 1,000 mcg by mouth daily.     No current facility-administered medications on file prior to visit.     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,dcis   History of chemotherapy 03/23/12- 05/10/12   s/p 4 cycles of FEC    Hx: UTI (urinary tract infection)    Knee pain    Lactose intolerance    Neck pain    S/P radiation therapy 07/10/12-08/23/12   Left Breast: 50 Gy/25 Fractions; Boost: 10 Gy/5 Fractions   Status post chemotherapy 1 dose 05/23/12   Stopped after one cycle of Taxol due to Grade 2 Toxicity   Status post chemotherapy 1 dose 05/23/12   Stopped after one cycle of Taxol due to Grade 2 Toxicity    PAST SURGICAL HISTORY: Past Surgical History:  Procedure Laterality Date   AXILLARY SURGERY  03/06/12   left, regional resection, 1/3 nodes pos   BACK SURGERY     LUMBAR DECOMPRESSION   BREAST BIOPSY  01/09/2012   left breast 3 0'clock/ER?PR =positive,her 2 neg   BREAST LUMPECTOMY Left 02/10/12   Left Breast: 2 Foci of Invasive Ductal Caand High grade Ductal Carcinoma In Situ, 0/14 nodes Left Axilla Negative : Regional Resection of Lymph Nodes: 0/5 Nodes Negative   FOOT SURGERY     multiple   INTRAUTERINE DEVICE REMOVAL  01/24/12   needle core Biospy  01/09/12   Left Breast: Invasive Ductal Carcinoma, Lymph Node Axilla: Metastatic Mammary Carcinoma   PORTACATH PLACEMENT  02/10/2012   Procedure: INSERTION PORT-A-CATH;  Surgeon:  Rolm Bookbinder, MD;  Location: [facility];  Service: General;  Laterality: Right;   Regional Resection  03/06/12   Left Axilla: 1/3 Nodes Metastatic Carcinoma   SPINE SURGERY  01/2011    SOCIAL HISTORY: Social History   Tobacco Use   Smoking status: Never Smoker   Smokeless tobacco: Never Used  Substance Use Topics   Alcohol use: No   Drug use: No    FAMILY HISTORY: Family History  Problem Relation Age of Onset   Diabetes Mother    Sudden death Mother    Schizophrenia Mother    Sleep apnea Mother    Obesity Mother    Seizures Son     ROS: Review of Systems  Constitutional: Negative for weight loss.  Endo/Heme/Allergies:       Positive for polyphagia    PHYSICAL EXAM: Pt in no acute distress  RECENT LABS AND TESTS: BMET  Component Value Date/Time   NA 139 12/10/2018 1151   NA 140 12/25/2014 1349   K 4.6 12/10/2018 1151   K 4.4 12/25/2014 1349   CL 100 12/10/2018 1151   CL 105 12/21/2012 1527   CO2 23 12/10/2018 1151   CO2 27 12/25/2014 1349   GLUCOSE 71 12/10/2018 1151   GLUCOSE 71 12/25/2014 1349   GLUCOSE 75 12/21/2012 1527   BUN 15 12/10/2018 1151   BUN 22.5 12/25/2014 1349   CREATININE 0.81 12/10/2018 1151   CREATININE 0.9 12/25/2014 1349   CALCIUM 10.5 (H) 12/10/2018 1151   CALCIUM 10.2 12/25/2014 1349   GFRNONAA 83 12/10/2018 1151   GFRAA 96 12/10/2018 1151   Lab Results  Component Value Date   HGBA1C 4.9 12/10/2018   Lab Results  Component Value Date   INSULIN 16.6 12/10/2018   CBC    Component Value Date/Time   WBC 4.9 12/10/2018 1151   WBC 5.1 12/25/2014 1348   WBC 4.0 08/14/2014 0933   RBC 4.27 12/10/2018 1151   RBC 4.03 12/25/2014 1348   RBC 4.32 08/14/2014 0933   HGB 12.4 12/10/2018 1151   HGB 11.7 12/25/2014 1348   HCT 37.3 12/10/2018 1151   HCT 35.9 12/25/2014 1348   PLT 175 12/25/2014 1348   MCV 87 12/10/2018 1151   MCV 89.1 12/25/2014 1348   MCH 29.0 12/10/2018 1151   MCH 29.0  12/25/2014 1348   MCH 27.1 02/09/2012 0900   MCHC 33.2 12/10/2018 1151   MCHC 32.6 12/25/2014 1348   MCHC 33.1 08/14/2014 0933   RDW 12.2 12/10/2018 1151   RDW 12.6 12/25/2014 1348   LYMPHSABS 1.3 12/10/2018 1151   LYMPHSABS 1.6 12/25/2014 1348   MONOABS 0.3 12/25/2014 1348   EOSABS 0.1 12/10/2018 1151   BASOSABS 0.1 12/10/2018 1151   BASOSABS 0.0 12/25/2014 1348   Iron/TIBC/Ferritin/ %Sat No results found for: IRON, TIBC, FERRITIN, IRONPCTSAT Lipid Panel     Component Value Date/Time   CHOL 202 (H) 12/10/2018 1151   TRIG 61 12/10/2018 1151   HDL 57 12/10/2018 1151   CHOLHDL 3 08/14/2014 0933   VLDL 9.6 08/14/2014 0933   LDLCALC 133 (H) 12/10/2018 1151   Hepatic Function Panel     Component Value Date/Time   PROT 7.2 12/10/2018 1151   PROT 6.9 12/25/2014 1349   ALBUMIN 4.5 12/10/2018 1151   ALBUMIN 3.7 12/25/2014 1349   AST 18 12/10/2018 1151   AST 19 12/25/2014 1349   ALT 16 12/10/2018 1151   ALT 19 12/25/2014 1349   ALKPHOS 117 12/10/2018 1151   ALKPHOS 116 12/25/2014 1349   BILITOT 0.3 12/10/2018 1151   BILITOT 0.28 12/25/2014 1349   BILIDIR 0.1 08/14/2014 0933      Component Value Date/Time   TSH 4.760 (H) 12/10/2018 1151   TSH 3.50 08/14/2014 0933   TSH 3.14 09/20/2011 1558     Ref. Range 12/10/2018 11:51  Vitamin D, 25-Hydroxy Latest Ref Range: 30.0 - 100.0 ng/mL 43.1    I, Alisha Chen, am acting as Location manager for [employer], FNP-C  I have reviewed the above documentation for accuracy and completeness, and I agree with the above.  - Alisha Denise, FNP-C.

## 2019-08-27 ENCOUNTER — Telehealth (INDEPENDENT_AMBULATORY_CARE_PROVIDER_SITE_OTHER): Payer: BC Managed Care – PPO | Admitting: Family Medicine

## 2019-08-27 ENCOUNTER — Other Ambulatory Visit: Payer: Self-pay

## 2019-08-27 DIAGNOSIS — F3289 Other specified depressive episodes: Secondary | ICD-10-CM

## 2019-08-27 DIAGNOSIS — E669 Obesity, unspecified: Secondary | ICD-10-CM | POA: Diagnosis not present

## 2019-08-27 DIAGNOSIS — F32A Depression, unspecified: Secondary | ICD-10-CM | POA: Insufficient documentation

## 2019-08-27 DIAGNOSIS — Z6831 Body mass index (BMI) 31.0-31.9, adult: Secondary | ICD-10-CM | POA: Diagnosis not present

## 2019-08-27 DIAGNOSIS — F329 Major depressive disorder, single episode, unspecified: Secondary | ICD-10-CM | POA: Insufficient documentation

## 2019-08-28 NOTE — Progress Notes (Signed)
Office: [phone]  /  Fax: [phone] TeleHealth Visit:  Alisha Chen has verbally consented to this TeleHealth visit today. The patient is located at work, the provider is located at the [employer] [facility] office. The participants in this visit include the listed provider and patient and any and all parties involved. The visit was conducted today via FaceTime.  HPI:   Chief Complaint: OBESITY Alisha Chen is here to discuss her progress with her obesity treatment plan. She is on the keep a food journal with 1150 to 1250 calories and 80 to 85 grams of protein daily plan and is following her eating plan approximately 70 % of the time. She states she is exercising on the elliptical and walking 60 to 120 minutes 3 times per week. [name] weighed 199 pounds today at home. She reports that she has gained weight. She lost down to 197.2 pounds but then got off track. Alisha Chen still eats out breakfast but she is making better choices. She struggles with consistency. Alisha Chen tends to make poor choices. She tends to eat too many carbohydrates and calories and she is not meeting her protein goals. We were unable to weigh the patient today for this TeleHealth visit. She feels as if she has gained weight since her last visit (199 lbs 08/27/19). She has lost 6 lbs since starting treatment with [place].  Depression with emotional eating behaviors Alisha Chen has noticed increased stress eating recently. She feel she self sabotages at times. Alisha Chen struggles with emotional eating and using food for comfort to the extent that it is negatively impacting her health. She often snacks when she is not hungry. Alisha Chen sometimes feels she is out of control and then feels guilty that she made poor food choices. She has been working on behavior modification techniques to help reduce her emotional eating and has been somewhat successful. She shows no sign of suicidal or homicidal ideations.  ASSESSMENT AND PLAN:  Other depression -  with emotional eating  Class 1 obesity with serious comorbidity and body mass index (BMI) of 31.0 to 31.9 in adult, unspecified obesity type  PLAN:  Depression with Emotional Eating Behaviors We discussed behavior modification techniques today to help Alisha Chen deal with her emotional eating and depression. She declined to see Dr. Mallie Mussel. We discussed Bupropion and patient declined. Alisha Chen agrees to follow up with our clinic in 2 weeks.  Obesity Alisha Chen is currently in the action stage of change. As such, her goal is to continue with weight loss efforts She has agreed to keep a food journal with 1150 to 1250 calories and 80 to 85 grams of protein daily [name] will continue walking and exercising on the elliptical 60 to 120 minutes, 3 times per week for weight loss and overall health benefits. We discussed the following Behavioral Modification Strategies today: planning for success, increasing lean protein intake, decreasing simple carbohydrates and emotional eating strategies  Alisha Chen has agreed to follow up with our clinic in 2 weeks. She was informed of the importance of frequent follow up visits to maximize her success with intensive lifestyle modifications for her multiple health conditions.  ALLERGIES: Allergies  Allergen Reactions  . Penicillins Other (See Comments)    Not sure, but was told by mother never to take this medication as a child  . Shellfish-Derived Products Other (See Comments)    Welts  . Clarithromycin Other (See Comments)    REACTION: NAUSEA,WEAK,BITTER TASTE  . Ondansetron Nausea And Vomiting    Headaches  MEDICATIONS: Current Outpatient Medications on File Prior to Visit  Medication Sig Dispense Refill  . celecoxib (CELEBREX) 100 MG capsule Take 100 mg by mouth 2 (two) times daily.    . cholecalciferol (VITAMIN D) 1000 UNITS tablet Take 1,000 Units by mouth daily.    Marland Kitchen ibuprofen (ADVIL,MOTRIN) 800 MG tablet Take 1 tablet (800 mg total) by mouth 3 (three) times  daily. 21 tablet 0  . metFORMIN (GLUCOPHAGE) 500 MG tablet Take 1 tablet (500 mg total) by mouth daily with breakfast. 30 tablet 0  . Misc Natural Products (COLON CLEANSE PO) Take by mouth 3 (three) times daily. 3 pills nightly qhs    . promethazine-dextromethorphan (PROMETHAZINE-DM) 6.25-15 MG/5ML syrup Take 5 mLs by mouth 4 (four) times daily as needed. 118 mL 0  . rizatriptan (MAXALT) 5 MG tablet TAKE 1 TABLET BY MOUTH AS NEEDED FOR MIGRAINE. MAY REPEAT IN 2 HOURS IF NEEDED 10 tablet 1  . vitamin B-12 (CYANOCOBALAMIN) 1000 MCG tablet Take 1,000 mcg by mouth daily.     No current facility-administered medications on file prior to visit.     PAST MEDICAL HISTORY: Past Medical History:  Diagnosis Date  . Anemia   . Back pain   . 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 History of chemotherapy 03/23/12- 05/10/12   s/p 4 cycles of FEC   . Hx: UTI (urinary tract infection)   . Knee pain   . Lactose intolerance   . Neck pain   . S/P radiation therapy 07/10/12-08/23/12   Left Breast: 50 Gy/25 Fractions; Boost: 10 Gy/5 Fractions  . Status post chemotherapy 1 dose 05/23/12   Stopped after one cycle of Taxol due to Grade 2 Toxicity  . Status post chemotherapy 1 dose 05/23/12   Stopped after one cycle of Taxol due to Grade 2 Toxicity    PAST SURGICAL HISTORY: Past Surgical History:  Procedure Laterality Date  . AXILLARY SURGERY  03/06/12   left, regional resection, 1/3 nodes pos  . BACK SURGERY     LUMBAR DECOMPRESSION  . BREAST BIOPSY  01/09/2012   left breast 3 0'clock/ER?PR =positive,her 2 neg  . BREAST LUMPECTOMY Left 02/10/12   Left Breast: 2 Foci of Invasive Ductal Caand High grade Ductal Carcinoma In Situ, 0/14 nodes Left Axilla Negative : Regional Resection of Lymph Nodes: 0/5 Nodes Negative  . FOOT SURGERY     multiple  . INTRAUTERINE DEVICE REMOVAL  01/24/12  . needle core Biospy  01/09/12   Left Breast: Invasive Ductal Carcinoma, Lymph Node  Axilla: Metastatic Mammary Carcinoma  . PORTACATH PLACEMENT  02/10/2012   Procedure: INSERTION PORT-A-CATH;  Surgeon: Rolm Bookbinder, MD;  Location: [facility];  Service: General;  Laterality: Right;  . Regional Resection  03/06/12   Left Axilla: 1/3 Nodes Metastatic Carcinoma  . SPINE SURGERY  01/2011    SOCIAL HISTORY: Social History   Tobacco Use  . Smoking status: Never Smoker  . Smokeless tobacco: Never Used  Substance Use Topics  . Alcohol use: No  . Drug use: No    FAMILY HISTORY: Family History  Problem Relation Age of Onset  . Diabetes Mother   . Sudden death Mother   . Schizophrenia Mother   . Sleep apnea Mother   . Obesity Mother   . Seizures Son     ROS: Review of Systems  Constitutional: Negative for weight loss.  Psychiatric/Behavioral: Positive for depression. Negative for suicidal ideas.  PHYSICAL EXAM: Pt in no acute distress  RECENT LABS AND TESTS: BMET    Component Value Date/Time   NA 139 12/10/2018 1151   NA 140 12/25/2014 1349   K 4.6 12/10/2018 1151   K 4.4 12/25/2014 1349   CL 100 12/10/2018 1151   CL 105 12/21/2012 1527   CO2 23 12/10/2018 1151   CO2 27 12/25/2014 1349   GLUCOSE 71 12/10/2018 1151   GLUCOSE 71 12/25/2014 1349   GLUCOSE 75 12/21/2012 1527   BUN 15 12/10/2018 1151   BUN 22.5 12/25/2014 1349   CREATININE 0.81 12/10/2018 1151   CREATININE 0.9 12/25/2014 1349   CALCIUM 10.5 (H) 12/10/2018 1151   CALCIUM 10.2 12/25/2014 1349   GFRNONAA 83 12/10/2018 1151   GFRAA 96 12/10/2018 1151   Lab Results  Component Value Date   HGBA1C 4.9 12/10/2018   Lab Results  Component Value Date   INSULIN 16.6 12/10/2018   CBC    Component Value Date/Time   WBC 4.9 12/10/2018 1151   WBC 5.1 12/25/2014 1348   WBC 4.0 08/14/2014 0933   RBC 4.27 12/10/2018 1151   RBC 4.03 12/25/2014 1348   RBC 4.32 08/14/2014 0933   HGB 12.4 12/10/2018 1151   HGB 11.7 12/25/2014 1348   HCT 37.3 12/10/2018 1151   HCT 35.9  12/25/2014 1348   PLT 175 12/25/2014 1348   MCV 87 12/10/2018 1151   MCV 89.1 12/25/2014 1348   MCH 29.0 12/10/2018 1151   MCH 29.0 12/25/2014 1348   MCH 27.1 02/09/2012 0900   MCHC 33.2 12/10/2018 1151   MCHC 32.6 12/25/2014 1348   MCHC 33.1 08/14/2014 0933   RDW 12.2 12/10/2018 1151   RDW 12.6 12/25/2014 1348   LYMPHSABS 1.3 12/10/2018 1151   LYMPHSABS 1.6 12/25/2014 1348   MONOABS 0.3 12/25/2014 1348   EOSABS 0.1 12/10/2018 1151   BASOSABS 0.1 12/10/2018 1151   BASOSABS 0.0 12/25/2014 1348   Iron/TIBC/Ferritin/ %Sat No results found for: IRON, TIBC, FERRITIN, IRONPCTSAT Lipid Panel     Component Value Date/Time   CHOL 202 (H) 12/10/2018 1151   TRIG 61 12/10/2018 1151   HDL 57 12/10/2018 1151   CHOLHDL 3 08/14/2014 0933   VLDL 9.6 08/14/2014 0933   LDLCALC 133 (H) 12/10/2018 1151   Hepatic Function Panel     Component Value Date/Time   PROT 7.2 12/10/2018 1151   PROT 6.9 12/25/2014 1349   ALBUMIN 4.5 12/10/2018 1151   ALBUMIN 3.7 12/25/2014 1349   AST 18 12/10/2018 1151   AST 19 12/25/2014 1349   ALT 16 12/10/2018 1151   ALT 19 12/25/2014 1349   ALKPHOS 117 12/10/2018 1151   ALKPHOS 116 12/25/2014 1349   BILITOT 0.3 12/10/2018 1151   BILITOT 0.28 12/25/2014 1349   BILIDIR 0.1 08/14/2014 0933      Component Value Date/Time   TSH 4.760 (H) 12/10/2018 1151   TSH 3.50 08/14/2014 0933   TSH 3.14 09/20/2011 1558      Ref. Range 12/10/2018 11:51  Vitamin D, 25-Hydroxy Latest Ref Range: 30.0 - 100.0 ng/mL 43.1    I, Doreene Nest, am acting as Location manager for [employer], FNP-C  I have reviewed the above documentation for accuracy and completeness, and I agree with the above.  - Dawn Whitmire, FNP-C.

## 2019-08-30 ENCOUNTER — Encounter (INDEPENDENT_AMBULATORY_CARE_PROVIDER_SITE_OTHER): Payer: Self-pay | Admitting: Family Medicine

## 2019-08-30 ENCOUNTER — Other Ambulatory Visit: Payer: Self-pay | Admitting: Hematology and Oncology

## 2019-08-30 DIAGNOSIS — Z1231 Encounter for screening mammogram for malignant neoplasm of breast: Secondary | ICD-10-CM

## 2019-09-02 NOTE — Telephone Encounter (Signed)
Please advise 

## 2019-09-11 ENCOUNTER — Encounter (INDEPENDENT_AMBULATORY_CARE_PROVIDER_SITE_OTHER): Payer: Self-pay | Admitting: Physician Assistant

## 2019-09-11 ENCOUNTER — Ambulatory Visit (INDEPENDENT_AMBULATORY_CARE_PROVIDER_SITE_OTHER): Payer: BC Managed Care – PPO | Admitting: Family Medicine

## 2019-09-11 ENCOUNTER — Other Ambulatory Visit: Payer: Self-pay

## 2019-09-11 ENCOUNTER — Encounter (INDEPENDENT_AMBULATORY_CARE_PROVIDER_SITE_OTHER): Payer: Self-pay | Admitting: Family Medicine

## 2019-09-11 DIAGNOSIS — Z6831 Body mass index (BMI) 31.0-31.9, adult: Secondary | ICD-10-CM

## 2019-09-11 DIAGNOSIS — E669 Obesity, unspecified: Secondary | ICD-10-CM | POA: Diagnosis not present

## 2019-09-11 DIAGNOSIS — E8881 Metabolic syndrome: Secondary | ICD-10-CM

## 2019-09-11 DIAGNOSIS — E88819 Insulin resistance, unspecified: Secondary | ICD-10-CM

## 2019-09-16 NOTE — Progress Notes (Signed)
Office: [phone]  /  Fax: [phone] TeleHealth Visit:  Alisha Chen has verbally consented to this TeleHealth visit today. The patient is located at home, the provider is located at the [employer] [facility] office. The participants in this visit include the listed provider and patient and any and all parties involved. The visit was conducted today via FaceTime.  HPI:   Chief Complaint: OBESITY Alisha Chen is here to discuss her progress with her obesity treatment plan. She is on the Category 2 plan and is following her eating plan approximately 60 % of the time. She states she is walking and exercising on the elliptical 1 to 3 hours, 3 times per week. Alisha Chen did well for a week, but then she went on a trip and got off track. She has gained 1 pound. She had been using all of her calories at breakfast and lunch and then skipping dinner. She is meeting her protein goals. We discussed the low carb plan and she may try this. We were unable to weigh the patient today for this TeleHealth visit. She feels as if she has gained weight since her last visit (weight 199 lbs, date not reported). She has lost 6 lbs since starting treatment with [place].  Insulin Resistance Alisha Chen has a diagnosis of insulin resistance based on her elevated fasting insulin level >5. Although Alisha Chen's blood glucose readings are still under good control, insulin resistance puts her at greater risk of metabolic syndrome and diabetes. Alisha Chen is taking metformin currently and she continues to work on diet and exercise to decrease risk of diabetes. Alisha Chen admits to polyphagia in the afternoon. Lab Results  Component Value Date   HGBA1C 4.9 12/10/2018    ASSESSMENT AND PLAN:  Insulin resistance  Class 1 obesity with serious comorbidity and body mass index (BMI) of 31.0 to 31.9 in adult, unspecified obesity type  PLAN:  Insulin Resistance Alisha Chen will continue to work on weight loss, exercise, and decreasing simple  carbohydrates in her diet to help decrease the risk of diabetes. We dicussed metformin including benefits and risks. She was informed that eating too many simple carbohydrates or too many calories at one sitting increases the likelihood of GI side effects. Alisha Chen will take metformin as directed with lunch and follow up with [place] as directed to monitor her progress.  Obesity Alisha Chen is currently in the action stage of change. As such, her goal is to continue with weight loss efforts She has agreed to keep a food journal with 1150 to 1250 calories and 80 grams of protein daily or follow a lower carbohydrate, vegetable and lean protein rich diet plan Alisha Chen will continue her current exercise regimen for weight loss and overall health benefits. We discussed the following Behavioral Modification Strategies today: planning for success and keeping healthy foods in the home. I advised her than she can have a non-starchy vegetable salad with 10 cal dressing if she does not have calories to use for dinner. Ideally, she will stretch calories over three meals.  Handouts for the Category 2 and low carb plan were sent to patient via MyChart.  Alisha Chen has agreed to follow up with our clinic in 2 weeks. She was informed of the importance of frequent follow up visits to maximize her success with intensive lifestyle modifications for her multiple health conditions.  ALLERGIES: Allergies  Allergen Reactions   Penicillins Other (See Comments)    Not sure, but was told by mother never to take this medication as a child  Shellfish-Derived Products Other (See Comments)    Welts   Clarithromycin Other (See Comments)    REACTION: NAUSEA,WEAK,BITTER TASTE   Ondansetron Nausea And Vomiting    Headaches    MEDICATIONS: Current Outpatient Medications on File Prior to Visit  Medication Sig Dispense Refill   celecoxib (CELEBREX) 100 MG capsule Take 100 mg by mouth 2 (two) times daily.     cholecalciferol (VITAMIN D)  1000 UNITS tablet Take 1,000 Units by mouth daily.     ibuprofen (ADVIL,MOTRIN) 800 MG tablet Take 1 tablet (800 mg total) by mouth 3 (three) times daily. 21 tablet 0   metFORMIN (GLUCOPHAGE) 500 MG tablet Take 1 tablet (500 mg total) by mouth daily with breakfast. 30 tablet 0   Misc Natural Products (COLON CLEANSE PO) Take by mouth 3 (three) times daily. 3 pills nightly qhs     promethazine-dextromethorphan (PROMETHAZINE-DM) 6.25-15 MG/5ML syrup Take 5 mLs by mouth 4 (four) times daily as needed. 118 mL 0   rizatriptan (MAXALT) 5 MG tablet TAKE 1 TABLET BY MOUTH AS NEEDED FOR MIGRAINE. MAY REPEAT IN 2 HOURS IF NEEDED 10 tablet 1   vitamin B-12 (CYANOCOBALAMIN) 1000 MCG tablet Take 1,000 mcg by mouth daily.     No current facility-administered medications on file prior to visit.     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,dcis   History of chemotherapy 03/23/12- 05/10/12   s/p 4 cycles of FEC    Hx: UTI (urinary tract infection)    Knee pain    Lactose intolerance    Neck pain    S/P radiation therapy 07/10/12-08/23/12   Left Breast: 50 Gy/25 Fractions; Boost: 10 Gy/5 Fractions   Status post chemotherapy 1 dose 05/23/12   Stopped after one cycle of Taxol due to Grade 2 Toxicity   Status post chemotherapy 1 dose 05/23/12   Stopped after one cycle of Taxol due to Grade 2 Toxicity    PAST SURGICAL HISTORY: Past Surgical History:  Procedure Laterality Date   AXILLARY SURGERY  03/06/12   left, regional resection, 1/3 nodes pos   BACK SURGERY     LUMBAR DECOMPRESSION   BREAST BIOPSY  01/09/2012   left breast 3 0'clock/ER?PR =positive,her 2 neg   BREAST LUMPECTOMY Left 02/10/12   Left Breast: 2 Foci of Invasive Ductal Caand High grade Ductal Carcinoma In Situ, 0/14 nodes Left Axilla Negative : Regional Resection of Lymph Nodes: 0/5 Nodes Negative   FOOT  SURGERY     multiple   INTRAUTERINE DEVICE REMOVAL  01/24/12   needle core Biospy  01/09/12   Left Breast: Invasive Ductal Carcinoma, Lymph Node Axilla: Metastatic Mammary Carcinoma   PORTACATH PLACEMENT  02/10/2012   Procedure: INSERTION PORT-A-CATH;  Surgeon: Rolm Bookbinder, MD;  Location: [facility];  Service: General;  Laterality: Right;   Regional Resection  03/06/12   Left Axilla: 1/3 Nodes Metastatic Carcinoma   SPINE SURGERY  01/2011    SOCIAL HISTORY: Social History   Tobacco Use   Smoking status: Never Smoker   Smokeless tobacco: Never Used  Substance Use Topics   Alcohol use: No   Drug use: No    FAMILY HISTORY: Family History  Problem Relation Age of Onset   Diabetes Mother    Sudden death Mother    Schizophrenia Mother    Sleep apnea Mother  Obesity Mother    Seizures Son     ROS: Review of Systems  Constitutional: Negative for weight loss.  Endo/Heme/Allergies:       Positive for polyphagia    PHYSICAL EXAM: Pt in no acute distress  RECENT LABS AND TESTS: BMET    Component Value Date/Time   NA 139 12/10/2018 1151   NA 140 12/25/2014 1349   K 4.6 12/10/2018 1151   K 4.4 12/25/2014 1349   CL 100 12/10/2018 1151   CL 105 12/21/2012 1527   CO2 23 12/10/2018 1151   CO2 27 12/25/2014 1349   GLUCOSE 71 12/10/2018 1151   GLUCOSE 71 12/25/2014 1349   GLUCOSE 75 12/21/2012 1527   BUN 15 12/10/2018 1151   BUN 22.5 12/25/2014 1349   CREATININE 0.81 12/10/2018 1151   CREATININE 0.9 12/25/2014 1349   CALCIUM 10.5 (H) 12/10/2018 1151   CALCIUM 10.2 12/25/2014 1349   GFRNONAA 83 12/10/2018 1151   GFRAA 96 12/10/2018 1151   Lab Results  Component Value Date   HGBA1C 4.9 12/10/2018   Lab Results  Component Value Date   INSULIN 16.6 12/10/2018   CBC    Component Value Date/Time   WBC 4.9 12/10/2018 1151   WBC 5.1 12/25/2014 1348   WBC 4.0 08/14/2014 0933   RBC 4.27 12/10/2018 1151   RBC 4.03 12/25/2014 1348    RBC 4.32 08/14/2014 0933   HGB 12.4 12/10/2018 1151   HGB 11.7 12/25/2014 1348   HCT 37.3 12/10/2018 1151   HCT 35.9 12/25/2014 1348   PLT 175 12/25/2014 1348   MCV 87 12/10/2018 1151   MCV 89.1 12/25/2014 1348   MCH 29.0 12/10/2018 1151   MCH 29.0 12/25/2014 1348   MCH 27.1 02/09/2012 0900   MCHC 33.2 12/10/2018 1151   MCHC 32.6 12/25/2014 1348   MCHC 33.1 08/14/2014 0933   RDW 12.2 12/10/2018 1151   RDW 12.6 12/25/2014 1348   LYMPHSABS 1.3 12/10/2018 1151   LYMPHSABS 1.6 12/25/2014 1348   MONOABS 0.3 12/25/2014 1348   EOSABS 0.1 12/10/2018 1151   BASOSABS 0.1 12/10/2018 1151   BASOSABS 0.0 12/25/2014 1348   Iron/TIBC/Ferritin/ %Sat No results found for: IRON, TIBC, FERRITIN, IRONPCTSAT Lipid Panel     Component Value Date/Time   CHOL 202 (H) 12/10/2018 1151   TRIG 61 12/10/2018 1151   HDL 57 12/10/2018 1151   CHOLHDL 3 08/14/2014 0933   VLDL 9.6 08/14/2014 0933   LDLCALC 133 (H) 12/10/2018 1151   Hepatic Function Panel     Component Value Date/Time   PROT 7.2 12/10/2018 1151   PROT 6.9 12/25/2014 1349   ALBUMIN 4.5 12/10/2018 1151   ALBUMIN 3.7 12/25/2014 1349   AST 18 12/10/2018 1151   AST 19 12/25/2014 1349   ALT 16 12/10/2018 1151   ALT 19 12/25/2014 1349   ALKPHOS 117 12/10/2018 1151   ALKPHOS 116 12/25/2014 1349   BILITOT 0.3 12/10/2018 1151   BILITOT 0.28 12/25/2014 1349   BILIDIR 0.1 08/14/2014 0933      Component Value Date/Time   TSH 4.760 (H) 12/10/2018 1151   TSH 3.50 08/14/2014 0933   TSH 3.14 09/20/2011 1558     Ref. Range 12/10/2018 11:51  Vitamin D, 25-Hydroxy Latest Ref Range: 30.0 - 100.0 ng/mL 43.1    I, Doreene Nest, am acting as Location manager for [employer], FNP-C.  I have reviewed the above documentation for accuracy and completeness, and I agree with the above.  - Birt Reinoso, FNP-C.

## 2019-09-17 ENCOUNTER — Encounter (INDEPENDENT_AMBULATORY_CARE_PROVIDER_SITE_OTHER): Payer: Self-pay | Admitting: Family Medicine

## 2019-09-17 NOTE — Addendum Note (Signed)
Addended by: Georgianne Fick on: 09/17/2019 11:02 AM   Modules accepted: Level of Service

## 2019-09-30 ENCOUNTER — Other Ambulatory Visit: Payer: Self-pay

## 2019-09-30 ENCOUNTER — Encounter (INDEPENDENT_AMBULATORY_CARE_PROVIDER_SITE_OTHER): Payer: Self-pay | Admitting: Family Medicine

## 2019-09-30 ENCOUNTER — Telehealth (INDEPENDENT_AMBULATORY_CARE_PROVIDER_SITE_OTHER): Payer: BC Managed Care – PPO | Admitting: Physician Assistant

## 2019-09-30 ENCOUNTER — Ambulatory Visit (INDEPENDENT_AMBULATORY_CARE_PROVIDER_SITE_OTHER): Payer: BC Managed Care – PPO | Admitting: Family Medicine

## 2019-09-30 DIAGNOSIS — E8881 Metabolic syndrome: Secondary | ICD-10-CM | POA: Diagnosis not present

## 2019-09-30 DIAGNOSIS — E669 Obesity, unspecified: Secondary | ICD-10-CM

## 2019-09-30 DIAGNOSIS — E88819 Insulin resistance, unspecified: Secondary | ICD-10-CM

## 2019-09-30 DIAGNOSIS — E559 Vitamin D deficiency, unspecified: Secondary | ICD-10-CM | POA: Diagnosis not present

## 2019-09-30 DIAGNOSIS — Z6831 Body mass index (BMI) 31.0-31.9, adult: Secondary | ICD-10-CM | POA: Diagnosis not present

## 2019-09-30 MED ORDER — METFORMIN HCL 500 MG PO TABS: 500.0000 mg | ORAL_TABLET | Freq: Three times a day (TID) | ORAL | 0 refills | Status: DC

## 2019-10-01 ENCOUNTER — Encounter (INDEPENDENT_AMBULATORY_CARE_PROVIDER_SITE_OTHER): Payer: Self-pay | Admitting: Family Medicine

## 2019-10-01 NOTE — Progress Notes (Signed)
Office: [phone]  /  Fax: [phone] TeleHealth Visit:  Alisha Chen has verbally consented to this TeleHealth visit today. The patient is located at work, the provider is located at the [employer] [facility] office. The participants in this visit include the listed provider and patient and any and all parties involved. The visit was conducted today via FaceTime.  HPI:   Chief Complaint: OBESITY Akua is here to discuss her progress with her obesity treatment plan. She is on the keep a food journal with 1150 to 1250 calories and 80 grams of protein daily plan and she is following her eating plan approximately 60 to 70 % of the time. She states she is walking 30 to 60 minutes 3 times per week. Jesabel reports she has gained a pound and her weight is 199 pounds today at home. She did not try the low carb plan. She does not always get her protein in and she tends to snack too much. She goes over 1250 calories many days, but she feels she balances this extra with exercise. We were unable to weigh the patient today for this TeleHealth visit. She feels as if she has gained weight since her last visit. She has lost 6 lbs since starting treatment with [place].  Insulin Resistance Justa has a diagnosis of insulin resistance based on her elevated fasting insulin level >5. Although Nekeya's blood glucose readings are still under good control, insulin resistance puts her at greater risk of metabolic syndrome and diabetes. She had stopped the metformin, because she felt it affected the taste of her food; however she has restarted it today. Aliza continues to work on diet and exercise to decrease risk of diabetes. Liannah admits polyphagia. Lab Results  Component Value Date   HGBA1C 4.9 12/10/2018    Vitamin D deficiency Muriah has a diagnosis of vitamin D deficiency. Her vitamin D level is nearly at goal, but it has not been checked since January 2020. She is on OTC vit D3 1,000 IU daily. Yanissa denies  nausea, vomiting or muscle weakness.  ASSESSMENT AND PLAN:  Insulin resistance - Plan: metFORMIN (GLUCOPHAGE) 500 MG tablet  Vitamin D deficiency  Class 1 obesity with serious comorbidity and body mass index (BMI) of 31.0 to 31.9 in adult, unspecified obesity type  PLAN:  Insulin Resistance Kiaria will continue to work on weight loss, exercise, and decreasing simple carbohydrates in her diet to help decrease the risk of diabetes. We discussed metformin including benefits and risks. She was informed that eating too many simple carbohydrates or too many calories at one sitting increases the likelihood of GI side effects. Anara asked about increasing the dose of metformin and a new prescription for metformin was written today for 500 mg three times daily with meals #90 with no refills. Romeka agreed to follow up with [place] as directed to monitor her progress.  Vitamin D Deficiency Rhaya was informed that low vitamin D levels contributes to fatigue and are associated with obesity, breast, and colon cancer. Ranasia will continue to take OTC Vit D3 @1 ,000 IU daily and she will follow up for routine testing of vitamin D, at least 2-3 times per year. She was informed of the risk of over-replacement of vitamin D and agrees to not increase her dose unless she discusses this with [place] first. We will check vitamin D level at the next in-office visit.  Obesity Vannette is currently in the action stage of change. As such, her goal is to continue  with weight loss efforts She has agreed to keep a food journal with 1150 to 1250 calories and 80 grams of protein daily Mileydi will continue walking and exercising with the elliptical for 30 to 60 minutes, 3 times per week for weight loss and overall health benefits. We discussed the following Behavioral Modification Strategies today: planning for success, keep a strict food journal, increasing lean protein intake and decreasing simple carbohydrates   Marcellia will try to keep  calories at 1200 per day.  Kesley has agreed to follow up with our clinic in 3 weeks. She was informed of the importance of frequent follow up visits to maximize her success with intensive lifestyle modifications for her multiple health conditions.  ALLERGIES: Allergies  Allergen Reactions  . Penicillins Other (See Comments)    Not sure, but was told by mother never to take this medication as a child  . Shellfish-Derived Products Other (See Comments)    Welts  . Clarithromycin Other (See Comments)    REACTION: NAUSEA,WEAK,BITTER TASTE  . Ondansetron Nausea And Vomiting    Headaches    MEDICATIONS: Current Outpatient Medications on File Prior to Visit  Medication Sig Dispense Refill  . celecoxib (CELEBREX) 100 MG capsule Take 100 mg by mouth 2 (two) times daily.    . cholecalciferol (VITAMIN D) 1000 UNITS tablet Take 1,000 Units by mouth daily.    Marland Kitchen ibuprofen (ADVIL,MOTRIN) 800 MG tablet Take 1 tablet (800 mg total) by mouth 3 (three) times daily. 21 tablet 0  . Misc Natural Products (COLON CLEANSE PO) Take by mouth 3 (three) times daily. 3 pills nightly qhs    . promethazine-dextromethorphan (PROMETHAZINE-DM) 6.25-15 MG/5ML syrup Take 5 mLs by mouth 4 (four) times daily as needed. 118 mL 0  . rizatriptan (MAXALT) 5 MG tablet TAKE 1 TABLET BY MOUTH AS NEEDED FOR MIGRAINE. MAY REPEAT IN 2 HOURS IF NEEDED 10 tablet 1  . vitamin B-12 (CYANOCOBALAMIN) 1000 MCG tablet Take 1,000 mcg by mouth daily.     No current facility-administered medications on file prior to visit.     PAST MEDICAL HISTORY: Past Medical History:  Diagnosis Date  . Anemia   . Back pain   . 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 History of chemotherapy 03/23/12- 05/10/12   s/p 4 cycles of FEC   . Hx: UTI (urinary tract infection)   . Knee pain   . Lactose intolerance   . Neck pain   . S/P radiation therapy 07/10/12-08/23/12   Left Breast: 50 Gy/25 Fractions; Boost: 10  Gy/5 Fractions  . Status post chemotherapy 1 dose 05/23/12   Stopped after one cycle of Taxol due to Grade 2 Toxicity  . Status post chemotherapy 1 dose 05/23/12   Stopped after one cycle of Taxol due to Grade 2 Toxicity    PAST SURGICAL HISTORY: Past Surgical History:  Procedure Laterality Date  . AXILLARY SURGERY  03/06/12   left, regional resection, 1/3 nodes pos  . BACK SURGERY     LUMBAR DECOMPRESSION  . BREAST BIOPSY  01/09/2012   left breast 3 0'clock/ER?PR =positive,her 2 neg  . BREAST LUMPECTOMY Left 02/10/12   Left Breast: 2 Foci of Invasive Ductal Caand High grade Ductal Carcinoma In Situ, 0/14 nodes Left Axilla Negative : Regional Resection of Lymph Nodes: 0/5 Nodes Negative  . FOOT SURGERY     multiple  . INTRAUTERINE DEVICE REMOVAL  01/24/12  . needle core Biospy  01/09/12  Left Breast: Invasive Ductal Carcinoma, Lymph Node Axilla: Metastatic Mammary Carcinoma  . PORTACATH PLACEMENT  02/10/2012   Procedure: INSERTION PORT-A-CATH;  Surgeon: Rolm Bookbinder, MD;  Location: [place];  Service: General;  Laterality: Right;  . Regional Resection  03/06/12   Left Axilla: 1/3 Nodes Metastatic Carcinoma  . SPINE SURGERY  01/2011    SOCIAL HISTORY: Social History   Tobacco Use  . Smoking status: Never Smoker  . Smokeless tobacco: Never Used  Substance Use Topics  . Alcohol use: No  . Drug use: No    FAMILY HISTORY: Family History  Problem Relation Age of Onset  . Diabetes Mother   . Sudden death Mother   . Schizophrenia Mother   . Sleep apnea Mother   . Obesity Mother   . Seizures Son     ROS: Review of Systems  Constitutional: Negative for weight loss.  Gastrointestinal: Negative for nausea and vomiting.  Musculoskeletal:       Negative for muscle weakness  Endo/Heme/Allergies:       Positive for polyphagia    PHYSICAL EXAM: Pt in no acute distress  RECENT LABS AND TESTS: BMET    Component Value Date/Time   NA 139 12/10/2018 1151   NA  140 12/25/2014 1349   K 4.6 12/10/2018 1151   K 4.4 12/25/2014 1349   CL 100 12/10/2018 1151   CL 105 12/21/2012 1527   CO2 23 12/10/2018 1151   CO2 27 12/25/2014 1349   GLUCOSE 71 12/10/2018 1151   GLUCOSE 71 12/25/2014 1349   GLUCOSE 75 12/21/2012 1527   BUN 15 12/10/2018 1151   BUN 22.5 12/25/2014 1349   CREATININE 0.81 12/10/2018 1151   CREATININE 0.9 12/25/2014 1349   CALCIUM 10.5 (H) 12/10/2018 1151   CALCIUM 10.2 12/25/2014 1349   GFRNONAA 83 12/10/2018 1151   GFRAA 96 12/10/2018 1151   Lab Results  Component Value Date   HGBA1C 4.9 12/10/2018   Lab Results  Component Value Date   INSULIN 16.6 12/10/2018   CBC    Component Value Date/Time   WBC 4.9 12/10/2018 1151   WBC 5.1 12/25/2014 1348   WBC 4.0 08/14/2014 0933   RBC 4.27 12/10/2018 1151   RBC 4.03 12/25/2014 1348   RBC 4.32 08/14/2014 0933   HGB 12.4 12/10/2018 1151   HGB 11.7 12/25/2014 1348   HCT 37.3 12/10/2018 1151   HCT 35.9 12/25/2014 1348   PLT 175 12/25/2014 1348   MCV 87 12/10/2018 1151   MCV 89.1 12/25/2014 1348   MCH 29.0 12/10/2018 1151   MCH 29.0 12/25/2014 1348   MCH 27.1 02/09/2012 0900   MCHC 33.2 12/10/2018 1151   MCHC 32.6 12/25/2014 1348   MCHC 33.1 08/14/2014 0933   RDW 12.2 12/10/2018 1151   RDW 12.6 12/25/2014 1348   LYMPHSABS 1.3 12/10/2018 1151   LYMPHSABS 1.6 12/25/2014 1348   MONOABS 0.3 12/25/2014 1348   EOSABS 0.1 12/10/2018 1151   BASOSABS 0.1 12/10/2018 1151   BASOSABS 0.0 12/25/2014 1348   Iron/TIBC/Ferritin/ %Sat No results found for: IRON, TIBC, FERRITIN, IRONPCTSAT Lipid Panel     Component Value Date/Time   CHOL 202 (H) 12/10/2018 1151   TRIG 61 12/10/2018 1151   HDL 57 12/10/2018 1151   CHOLHDL 3 08/14/2014 0933   VLDL 9.6 08/14/2014 0933   LDLCALC 133 (H) 12/10/2018 1151   Hepatic Function Panel     Component Value Date/Time   PROT 7.2 12/10/2018 1151   PROT 6.9  12/25/2014 1349   ALBUMIN 4.5 12/10/2018 1151   ALBUMIN 3.7 12/25/2014 1349   AST  18 12/10/2018 1151   AST 19 12/25/2014 1349   ALT 16 12/10/2018 1151   ALT 19 12/25/2014 1349   ALKPHOS 117 12/10/2018 1151   ALKPHOS 116 12/25/2014 1349   BILITOT 0.3 12/10/2018 1151   BILITOT 0.28 12/25/2014 1349   BILIDIR 0.1 08/14/2014 0933      Component Value Date/Time   TSH 4.760 (H) 12/10/2018 1151   TSH 3.50 08/14/2014 0933   TSH 3.14 09/20/2011 1558     Ref. Range 12/10/2018 11:51  Vitamin D, 25-Hydroxy Latest Ref Range: 30.0 - 100.0 ng/mL 43.1    I, Doreene Nest, am acting as Location manager for [employer], FNP-C  I have reviewed the above documentation for accuracy and completeness, and I agree with the above.  - Asuncion Tapscott, FNP-C.

## 2019-10-01 NOTE — Telephone Encounter (Signed)
Please Advise

## 2019-10-07 ENCOUNTER — Other Ambulatory Visit: Payer: Self-pay

## 2019-10-07 ENCOUNTER — Encounter: Payer: Self-pay | Admitting: Obstetrics and Gynecology

## 2019-10-07 ENCOUNTER — Ambulatory Visit (INDEPENDENT_AMBULATORY_CARE_PROVIDER_SITE_OTHER): Payer: BC Managed Care – PPO | Admitting: Obstetrics and Gynecology

## 2019-10-07 VITALS — BP 128/83 | HR 71 | Wt 201.0 lb

## 2019-10-07 DIAGNOSIS — Z124 Encounter for screening for malignant neoplasm of cervix: Secondary | ICD-10-CM

## 2019-10-07 DIAGNOSIS — Z1151 Encounter for screening for human papillomavirus (HPV): Secondary | ICD-10-CM

## 2019-10-07 DIAGNOSIS — Z01419 Encounter for gynecological examination (general) (routine) without abnormal findings: Secondary | ICD-10-CM | POA: Diagnosis not present

## 2019-10-07 NOTE — Progress Notes (Signed)
Subjective:     Alisha Chen is a 53 y.o. female P2 postmenopausal with BMI 32  who is here for a comprehensive physical exam. The patient reports no problems. She denies any abnormal vaginal discharge, vaginal bleeding, or pelvic pain. She is sexually active without complaints. She denies any urinary incontinence  Past Medical History:  Diagnosis Date  . Anemia   . Back pain   . 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 History of chemotherapy 03/23/12- 05/10/12   s/p 4 cycles of FEC   . Hx: UTI (urinary tract infection)   . Knee pain   . Lactose intolerance   . Neck pain   . S/P radiation therapy 07/10/12-08/23/12   Left Breast: 50 Gy/25 Fractions; Boost: 10 Gy/5 Fractions  . Status post chemotherapy 1 dose 05/23/12   Stopped after one cycle of Taxol due to Grade 2 Toxicity  . Status post chemotherapy 1 dose 05/23/12   Stopped after one cycle of Taxol due to Grade 2 Toxicity   Past Surgical History:  Procedure Laterality Date  . AXILLARY SURGERY  03/06/12   left, regional resection, 1/3 nodes pos  . BACK SURGERY     LUMBAR DECOMPRESSION  . BREAST BIOPSY  01/09/2012   left breast 3 0'clock/ER?PR =positive,her 2 neg  . BREAST LUMPECTOMY Left 02/10/12   Left Breast: 2 Foci of Invasive Ductal Caand High grade Ductal Carcinoma In Situ, 0/14 nodes Left Axilla Negative : Regional Resection of Lymph Nodes: 0/5 Nodes Negative  . FOOT SURGERY     multiple  . INTRAUTERINE DEVICE REMOVAL  01/24/12  . needle core Biospy  01/09/12   Left Breast: Invasive Ductal Carcinoma, Lymph Node Axilla: Metastatic Mammary Carcinoma  . PORTACATH PLACEMENT  02/10/2012   Procedure: INSERTION PORT-A-CATH;  Surgeon: Rolm Bookbinder, MD;  Location: [place];  Service: General;  Laterality: Right;  . Regional Resection  03/06/12   Left Axilla: 1/3 Nodes Metastatic Carcinoma  . SPINE SURGERY  01/2011   Family History  Problem Relation Age of Onset  .  Diabetes Mother   . Sudden death Mother   . Schizophrenia Mother   . Sleep apnea Mother   . Obesity Mother   . Seizures Son     Social History   Socioeconomic History  . Marital status: Married    Spouse name: Louie Casa  . Number of children: 1  . Years of education: Not on file  . Highest education level: Not on file  Occupational History  . Occupation: Pharmacist, hospital, Art gallery manager    Employer: [employer]    Comment: [employer] scientific officer: [employer]  . Financial resource strain: Not on file  . Food insecurity    Worry: Not on file    Inability: Not on file  . Transportation needs    Medical: Not on file    Non-medical: Not on file  Tobacco Use  . Smoking status: Never Smoker  . Smokeless tobacco: Never Used  Substance and Sexual Activity  . Alcohol use: No  . Drug use: No  . Sexual activity: Yes    Partners: Male    Birth control/protection: None    Comment: menarche age 53,premenopausa; G!P1,1st pregnancy age 87  Lifestyle  . Physical activity    Days per week: Not on file    Minutes per session: Not on file  . Stress: Not on file  Relationships  .  Social Herbalist on phone: Not on file    Gets together: Not on file    Attends religious service: Not on file    Active member of club or organization: Not on file    Attends meetings of clubs or organizations: Not on file    Relationship status: Not on file  . Intimate partner violence    Fear of current or ex partner: Not on file    Emotionally abused: Not on file    Physically abused: Not on file    Forced sexual activity: Not on file  Other Topics Concern  . Not on file  Social History Narrative   Married x 21 years. Production assistant, radio at [employer]. 57 ten year old son   Health Maintenance  Topic Date Due  . HIV Screening  05/17/1980  . INFLUENZA VACCINE  06/22/2019  . MAMMOGRAM  10/31/2019  . PAP SMEAR-Modifier  08/27/2021  . TETANUS/TDAP  09/19/2021  .  COLONOSCOPY  09/09/2024       Review of Systems Pertinent items are noted in HPI.   Objective:  Blood pressure 128/83, pulse 71, weight 201 lb (91.2 kg), last menstrual period 02/27/2012.     GENERAL: Well-developed, well-nourished female in no acute distress.  HEENT: Normocephalic, atraumatic. Sclerae anicteric.  NECK: Supple. Normal thyroid.  LUNGS: Clear to auscultation bilaterally.  HEART: Regular rate and rhythm. BREASTS: Symmetric in size. No palpable masses or lymphadenopathy, skin changes, or nipple drainage. ABDOMEN: Soft, nontender, nondistended. No organomegaly. PELVIC: Normal external female genitalia. Vagina is pink and rugated.  Normal discharge. Normal appearing cervix. Uterus is normal in size. No adnexal mass or tenderness. EXTREMITIES: No cyanosis, clubbing, or edema, 2+ distal pulses.    Assessment:    Healthy female exam.      Plan:    Pap smear collected Normal screening mammogram 10/2018- scheduled 10/2019 Patient will be contacted with abnormal results Patient to return in a fasting state for health maintenance labs RTC prn or in a year See After Visit Summary for Counseling Recommendations

## 2019-10-11 LAB — CYTOLOGY - PAP
Adequacy: ABSENT
Comment: NEGATIVE
Diagnosis: NEGATIVE
High risk HPV: NEGATIVE

## 2019-10-15 ENCOUNTER — Other Ambulatory Visit: Payer: BC Managed Care – PPO

## 2019-10-18 ENCOUNTER — Encounter: Payer: Self-pay | Admitting: Adult Health

## 2019-10-21 ENCOUNTER — Encounter: Payer: Self-pay | Admitting: Adult Health

## 2019-10-21 ENCOUNTER — Other Ambulatory Visit: Payer: Self-pay

## 2019-10-21 ENCOUNTER — Other Ambulatory Visit: Payer: BC Managed Care – PPO

## 2019-10-21 ENCOUNTER — Telehealth: Payer: Self-pay | Admitting: Adult Health

## 2019-10-21 DIAGNOSIS — Z01419 Encounter for gynecological examination (general) (routine) without abnormal findings: Secondary | ICD-10-CM

## 2019-10-21 NOTE — Telephone Encounter (Signed)
I talk with patient regarding change from 12/14 to 12/21

## 2019-10-22 ENCOUNTER — Encounter (INDEPENDENT_AMBULATORY_CARE_PROVIDER_SITE_OTHER): Payer: Self-pay | Admitting: Family Medicine

## 2019-10-22 ENCOUNTER — Telehealth (INDEPENDENT_AMBULATORY_CARE_PROVIDER_SITE_OTHER): Payer: BC Managed Care – PPO | Admitting: Family Medicine

## 2019-10-22 DIAGNOSIS — Z6831 Body mass index (BMI) 31.0-31.9, adult: Secondary | ICD-10-CM

## 2019-10-22 DIAGNOSIS — E669 Obesity, unspecified: Secondary | ICD-10-CM | POA: Diagnosis not present

## 2019-10-22 DIAGNOSIS — E8881 Metabolic syndrome: Secondary | ICD-10-CM | POA: Diagnosis not present

## 2019-10-22 DIAGNOSIS — E559 Vitamin D deficiency, unspecified: Secondary | ICD-10-CM

## 2019-10-22 LAB — COMPREHENSIVE METABOLIC PANEL
ALT: 16 IU/L (ref 0–32)
AST: 18 IU/L (ref 0–40)
Albumin/Globulin Ratio: 1.7 (ref 1.2–2.2)
Albumin: 4.6 g/dL (ref 3.8–4.9)
Alkaline Phosphatase: 121 IU/L — ABNORMAL HIGH (ref 39–117)
BUN/Creatinine Ratio: 19 (ref 9–23)
BUN: 17 mg/dL (ref 6–24)
Bilirubin Total: <0.2 mg/dL (ref 0.0–1.2)
CO2: 25 mmol/L (ref 20–29)
Calcium: 11.1 mg/dL — ABNORMAL HIGH (ref 8.7–10.2)
Chloride: 104 mmol/L (ref 96–106)
Creatinine, Ser: 0.88 mg/dL (ref 0.57–1.00)
GFR calc Af Amer: 86 mL/min/{1.73_m2} (ref >59)
GFR calc non Af Amer: 75 mL/min/{1.73_m2} (ref >59)
Globulin, Total: 2.7 g/dL (ref 1.5–4.5)
Glucose: 82 mg/dL (ref 65–99)
Potassium: 4.6 mmol/L (ref 3.5–5.2)
Sodium: 141 mmol/L (ref 134–144)
Total Protein: 7.3 g/dL (ref 6.0–8.5)

## 2019-10-22 LAB — LIPID PANEL
Chol/HDL Ratio: 4 ratio (ref 0.0–4.4)
Cholesterol, Total: 215 mg/dL — ABNORMAL HIGH (ref 100–199)
HDL: 54 mg/dL (ref >39)
LDL Chol Calc (NIH): 140 mg/dL — ABNORMAL HIGH (ref 0–99)
Triglycerides: 115 mg/dL (ref 0–149)
VLDL Cholesterol Cal: 21 mg/dL (ref 5–40)

## 2019-10-22 LAB — HEMOGLOBIN A1C
Est. average glucose Bld gHb Est-mCnc: 91 mg/dL
Hgb A1c MFr Bld: 4.8 % (ref 4.8–5.6)

## 2019-10-22 LAB — CBC
Hematocrit: 38.8 % (ref 34.0–46.6)
Hemoglobin: 12.6 g/dL (ref 11.1–15.9)
MCH: 28.4 pg (ref 26.6–33.0)
MCHC: 32.5 g/dL (ref 31.5–35.7)
MCV: 87 fL (ref 79–97)
Platelets: 244 10*3/uL (ref 150–450)
RBC: 4.44 x10E6/uL (ref 3.77–5.28)
RDW: 12 % (ref 11.7–15.4)
WBC: 5.7 10*3/uL (ref 3.4–10.8)

## 2019-10-22 LAB — TSH: TSH: 7.1 u[IU]/mL — ABNORMAL HIGH (ref 0.450–4.500)

## 2019-10-22 NOTE — Progress Notes (Signed)
Office: [phone]  /  Fax: [phone] TeleHealth Visit:  Alisha Chen has verbally consented to this TeleHealth visit today. The patient is located at home, the provider is located at the [employer] [facility] office. The participants in this visit include the listed provider and patient. The visit was conducted today via Face Time.  HPI:   Chief Complaint: OBESITY Alisha Chen is here to discuss her progress with her obesity treatment plan. She is keeping a food journal with 1150 to 1250 calories and 80 grams of protein and is following her eating plan approximately 70 % of the time. She states she is using the elliptical 60 minutes 1 to 3 times per week. Alisha Chen has adhered to the plan fairly well over the past few weeks. She journals consistently and mostly stays around 1200 to 1300 daily. Alisha Chen gets her protein in without difficulty. She was off the plan for one meal on Thanksgiving Day.  We were unable to weigh the patient today for this TeleHealth visit. She feels as if she has lost weight since her last visit. She has lost 6 lbs since starting treatment with [place].  Insulin Resistance Alisha Chen has a diagnosis of insulin resistance based on her elevated fasting insulin level >5. Although Alisha Chen's blood glucose readings are still under good control, insulin resistance puts her at greater risk of metabolic syndrome and diabetes. Alisha Chen is prescribed metformin TID, but only takes it BID. She has had occasional loose stools with metformin. Alisha Chen continues to work on diet and exercise to decrease risk of diabetes. Alisha Chen notes polyphagia at times.  Vitamin D Deficiency Alisha Chen has a diagnosis of vitamin D deficiency. She is currently on OTC vit D 1,000 IU daily, but is not at goal. Her last vitamin D level was 43.1 on 12/10/18. Alisha Chen denies nausea, vomiting, or muscle weakness.  ASSESSMENT AND PLAN:  Insulin resistance  Vitamin D deficiency  Class 1 obesity with serious comorbidity and body mass  index (BMI) of 31.0 to 31.9 in adult, unspecified obesity type  PLAN:  Insulin Resistance Alisha Chen will continue to work on weight loss, exercise, and decreasing simple carbohydrates in her diet to help decrease the risk of diabetes. She was informed that eating too many simple carbohydrates or too many calories at one sitting increases the likelihood of GI side effects. [name] agreed to continue taking metformin and Alisha Chen agreed to follow up with [place] as directed to monitor her progress.   Vitamin D Deficiency Alisha Chen was informed that low vitamin D levels contribute to fatigue and are associated with obesity, breast, and colon cancer. Alisha Chen agrees to continue to take OTC Vit D @1 ,000 IU every day with no refills and will follow up for routine testing of vitamin D, at least 2-3 times per year. She was informed of the risk of over-replacement of vitamin D and agrees to not increase her dose unless she discusses this with [place] first. We will check her vitamin D level at her next in office visit. Alisha Chen agrees to follow up in 2 weeks as directed.  Obesity Alisha Chen is currently in the action stage of change. As such, her goal is to continue with weight loss efforts. She has agreed to keep a food journal with 1150 to 1250 calories and 80 grams of protein daily. Alisha Chen has been instructed to work up to a goal of 150 minutes of combined cardio and strengthening exercise per week for weight loss and overall health benefits. We discussed the following Behavioral Modification  Stratagies today: decreasing simple carbohydrates, planning for success, and keeping a strict food journal.  We discussed various medication options to help Alisha Chen with her weight loss efforts and we both agreed to begin Saxenda 3.0 mg SQ daily #5 pens with nanoneedles x 100 with no refills. Alisha Chen is so take 0.6 mg daily. She denies personal or family history of thyroid cancer, history of pancreatitis, or current cholelithiasis. She was informed of  side effects.  Alisha Chen has agreed to follow up with our clinic in 2 weeks. She was informed of the importance of frequent follow up visits to maximize her success with intensive lifestyle modifications for her multiple health conditions.  ALLERGIES: Allergies  Allergen Reactions  . Penicillins Other (See Comments)    Not sure, but was told by mother never to take this medication as a child  . Shellfish-Derived Products Other (See Comments)    Welts  . Clarithromycin Other (See Comments)    REACTION: NAUSEA,WEAK,BITTER TASTE  . Ondansetron Nausea And Vomiting    Headaches    MEDICATIONS: Current Outpatient Medications on File Prior to Visit  Medication Sig Dispense Refill  . celecoxib (CELEBREX) 100 MG capsule Take 100 mg by mouth 2 (two) times daily.    . cholecalciferol (VITAMIN D) 1000 UNITS tablet Take 1,000 Units by mouth daily.    Marland Kitchen ibuprofen (ADVIL,MOTRIN) 800 MG tablet Take 1 tablet (800 mg total) by mouth 3 (three) times daily. 21 tablet 0  . metFORMIN (GLUCOPHAGE) 500 MG tablet Take 1 tablet (500 mg total) by mouth 3 (three) times daily with meals. 90 tablet 0  . Misc Natural Products (COLON CLEANSE PO) Take by mouth 3 (three) times daily. 3 pills nightly qhs    . promethazine-dextromethorphan (PROMETHAZINE-DM) 6.25-15 MG/5ML syrup Take 5 mLs by mouth 4 (four) times daily as needed. 118 mL 0  . rizatriptan (MAXALT) 5 MG tablet TAKE 1 TABLET BY MOUTH AS NEEDED FOR MIGRAINE. MAY REPEAT IN 2 HOURS IF NEEDED 10 tablet 1  . vitamin B-12 (CYANOCOBALAMIN) 1000 MCG tablet Take 1,000 mcg by mouth daily.     No current facility-administered medications on file prior to visit.     PAST MEDICAL HISTORY: Past Medical History:  Diagnosis Date  . Anemia   . Back pain   . Breast cancer ([place])    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,dcis  . History of chemotherapy 03/23/12- 05/10/12   s/p 4 cycles of FEC   . Hx: UTI (urinary tract infection)   . Knee pain   . Lactose  intolerance   . Neck pain   . S/P radiation therapy 07/10/12-08/23/12   Left Breast: 50 Gy/25 Fractions; Boost: 10 Gy/5 Fractions  . Status post chemotherapy 1 dose 05/23/12   Stopped after one cycle of Taxol due to Grade 2 Toxicity  . Status post chemotherapy 1 dose 05/23/12   Stopped after one cycle of Taxol due to Grade 2 Toxicity    PAST SURGICAL HISTORY: Past Surgical History:  Procedure Laterality Date  . AXILLARY SURGERY  03/06/12   left, regional resection, 1/3 nodes pos  . BACK SURGERY     LUMBAR DECOMPRESSION  . BREAST BIOPSY  01/09/2012   left breast 3 0'clock/ER?PR =positive,her 2 neg  . BREAST LUMPECTOMY Left 02/10/12   Left Breast: 2 Foci of Invasive Ductal Caand High grade Ductal Carcinoma In Situ, 0/14 nodes Left Axilla Negative : Regional Resection of Lymph Nodes: 0/5 Nodes Negative  . FOOT SURGERY  multiple  . INTRAUTERINE DEVICE REMOVAL  01/24/12  . needle core Biospy  01/09/12   Left Breast: Invasive Ductal Carcinoma, Lymph Node Axilla: Metastatic Mammary Carcinoma  . PORTACATH PLACEMENT  02/10/2012   Procedure: INSERTION PORT-A-CATH;  Surgeon: Rolm Bookbinder, MD;  Location: [facility];  Service: General;  Laterality: Right;  . Regional Resection  03/06/12   Left Axilla: 1/3 Nodes Metastatic Carcinoma  . SPINE SURGERY  01/2011    SOCIAL HISTORY: Social History   Tobacco Use  . Smoking status: Never Smoker  . Smokeless tobacco: Never Used  Substance Use Topics  . Alcohol use: No  . Drug use: No    FAMILY HISTORY: Family History  Problem Relation Age of Onset  . Diabetes Mother   . Sudden death Mother   . Schizophrenia Mother   . Sleep apnea Mother   . Obesity Mother   . Seizures Son     ROS: Review of Systems  Gastrointestinal: Negative for nausea and vomiting.  Musculoskeletal:       Negative for muscle weakness.  Endo/Heme/Allergies:       Positive for polyphagia.    PHYSICAL EXAM: Pt in no acute distress  RECENT LABS AND  TESTS: BMET    Component Value Date/Time   NA 141 10/21/2019 1610   NA 140 12/25/2014 1349   K 4.6 10/21/2019 1610   K 4.4 12/25/2014 1349   CL 104 10/21/2019 1610   CL 105 12/21/2012 1527   CO2 25 10/21/2019 1610   CO2 27 12/25/2014 1349   GLUCOSE 82 10/21/2019 1610   GLUCOSE 71 12/25/2014 1349   GLUCOSE 75 12/21/2012 1527   BUN 17 10/21/2019 1610   BUN 22.5 12/25/2014 1349   CREATININE 0.88 10/21/2019 1610   CREATININE 0.9 12/25/2014 1349   CALCIUM 11.1 (H) 10/21/2019 1610   CALCIUM 10.2 12/25/2014 1349   GFRNONAA 75 10/21/2019 1610   GFRAA 86 10/21/2019 1610   Lab Results  Component Value Date   HGBA1C 4.8 10/21/2019   HGBA1C 4.9 12/10/2018   Lab Results  Component Value Date   INSULIN 16.6 12/10/2018   CBC    Component Value Date/Time   WBC 5.7 10/21/2019 1610   WBC 5.1 12/25/2014 1348   WBC 4.0 08/14/2014 0933   RBC 4.44 10/21/2019 1610   RBC 4.03 12/25/2014 1348   RBC 4.32 08/14/2014 0933   HGB 12.6 10/21/2019 1610   HGB 11.7 12/25/2014 1348   HCT 38.8 10/21/2019 1610   HCT 35.9 12/25/2014 1348   PLT 244 10/21/2019 1610   MCV 87 10/21/2019 1610   MCV 89.1 12/25/2014 1348   MCH 28.4 10/21/2019 1610   MCH 29.0 12/25/2014 1348   MCH 27.1 02/09/2012 0900   MCHC 32.5 10/21/2019 1610   MCHC 32.6 12/25/2014 1348   MCHC 33.1 08/14/2014 0933   RDW 12.0 10/21/2019 1610   RDW 12.6 12/25/2014 1348   LYMPHSABS 1.3 12/10/2018 1151   LYMPHSABS 1.6 12/25/2014 1348   MONOABS 0.3 12/25/2014 1348   EOSABS 0.1 12/10/2018 1151   BASOSABS 0.1 12/10/2018 1151   BASOSABS 0.0 12/25/2014 1348   Iron/TIBC/Ferritin/ %Sat No results found for: IRON, TIBC, FERRITIN, IRONPCTSAT Lipid Panel     Component Value Date/Time   CHOL 215 (H) 10/21/2019 1610   TRIG 115 10/21/2019 1610   HDL 54 10/21/2019 1610   CHOLHDL 4.0 10/21/2019 1610   CHOLHDL 3 08/14/2014 0933   VLDL 9.6 08/14/2014 0933   LDLCALC 140 (H) 10/21/2019 1610  Hepatic Function Panel     Component Value  Date/Time   PROT 7.3 10/21/2019 1610   PROT 6.9 12/25/2014 1349   ALBUMIN 4.6 10/21/2019 1610   ALBUMIN 3.7 12/25/2014 1349   AST 18 10/21/2019 1610   AST 19 12/25/2014 1349   ALT 16 10/21/2019 1610   ALT 19 12/25/2014 1349   ALKPHOS 121 (H) 10/21/2019 1610   ALKPHOS 116 12/25/2014 1349   BILITOT <0.2 10/21/2019 1610   BILITOT 0.28 12/25/2014 1349   BILIDIR 0.1 08/14/2014 0933      Component Value Date/Time   TSH 7.100 (H) 10/21/2019 1610   TSH 4.760 (H) 12/10/2018 1151   TSH 3.50 08/14/2014 0933   Results for ELISSIA, MILANOVICH (MRN [id number]) as of 10/22/2019 15:50  Ref. Range 12/10/2018 11:51  Vitamin D, 25-Hydroxy Latest Ref Range: 30.0 - 100.0 ng/mL 43.1    I, Marcille Blanco, CMA, am acting as Location manager for [employer], FNP-C  I have reviewed the above documentation for accuracy and completeness, and I agree with the above.  - Rosario Duey, FNP-C.

## 2019-10-23 MED ORDER — BD PEN NEEDLE NANO 2ND GEN 32G X 4 MM MISC: 0 refills | Status: DC

## 2019-10-23 MED ORDER — SAXENDA 18 MG/3ML ~~LOC~~ SOPN: 3.0000 mg | PEN_INJECTOR | Freq: Every day | SUBCUTANEOUS | 0 refills | Status: DC

## 2019-10-24 ENCOUNTER — Encounter (INDEPENDENT_AMBULATORY_CARE_PROVIDER_SITE_OTHER): Payer: Self-pay

## 2019-10-24 ENCOUNTER — Telehealth: Payer: Self-pay | Admitting: Family Medicine

## 2019-10-24 ENCOUNTER — Other Ambulatory Visit: Payer: Self-pay | Admitting: Family Medicine

## 2019-10-24 ENCOUNTER — Other Ambulatory Visit: Payer: Self-pay

## 2019-10-24 DIAGNOSIS — E039 Hypothyroidism, unspecified: Secondary | ICD-10-CM

## 2019-10-24 MED ORDER — LEVOTHYROXINE SODIUM 50 MCG PO TABS: 50.0000 ug | ORAL_TABLET | Freq: Every day | ORAL | 2 refills | Status: DC

## 2019-10-24 NOTE — Telephone Encounter (Signed)
Patient called and would like to talk to Dr. Etter Sjogren about her Gyn labs she got back. They recommend her to speak with her PCP about them. Please call patient back, thanks.

## 2019-10-24 NOTE — Telephone Encounter (Signed)
Please review GYN labs. Labs are in [place]. Please advise

## 2019-10-24 NOTE — Telephone Encounter (Signed)
Please advise 

## 2019-10-24 NOTE — Progress Notes (Signed)
t

## 2019-10-29 ENCOUNTER — Encounter: Payer: Self-pay | Admitting: Family Medicine

## 2019-10-29 NOTE — Telephone Encounter (Signed)
With my experience [employer] does not cover it Side effects ----  abd pain, nausea She will want to check with her ins first to see if its covered --- if yes--- office visit-- virtual ok to discuss

## 2019-10-31 NOTE — Telephone Encounter (Signed)
In general yes---  As I said there are side effects to everything but  Most people do ok She can make app to discuss

## 2019-11-04 ENCOUNTER — Encounter: Payer: BC Managed Care – PPO | Admitting: Adult Health

## 2019-11-06 ENCOUNTER — Telehealth (INDEPENDENT_AMBULATORY_CARE_PROVIDER_SITE_OTHER): Payer: BC Managed Care – PPO | Admitting: Family Medicine

## 2019-11-06 ENCOUNTER — Other Ambulatory Visit: Payer: Self-pay

## 2019-11-06 ENCOUNTER — Encounter (INDEPENDENT_AMBULATORY_CARE_PROVIDER_SITE_OTHER): Payer: Self-pay | Admitting: Family Medicine

## 2019-11-06 DIAGNOSIS — E669 Obesity, unspecified: Secondary | ICD-10-CM

## 2019-11-06 DIAGNOSIS — Z6831 Body mass index (BMI) 31.0-31.9, adult: Secondary | ICD-10-CM

## 2019-11-06 DIAGNOSIS — E559 Vitamin D deficiency, unspecified: Secondary | ICD-10-CM | POA: Diagnosis not present

## 2019-11-11 ENCOUNTER — Other Ambulatory Visit: Payer: Self-pay

## 2019-11-11 ENCOUNTER — Encounter: Payer: Self-pay | Admitting: Adult Health

## 2019-11-11 ENCOUNTER — Inpatient Hospital Stay: Payer: BC Managed Care – PPO | Attending: Adult Health | Admitting: Adult Health

## 2019-11-11 VITALS — BP 129/70 | HR 75 | Temp 98.5°F | Resp 18 | Ht 66.0 in | Wt 198.4 lb

## 2019-11-11 DIAGNOSIS — E0590 Thyrotoxicosis, unspecified without thyrotoxic crisis or storm: Secondary | ICD-10-CM | POA: Insufficient documentation

## 2019-11-11 DIAGNOSIS — Z9221 Personal history of antineoplastic chemotherapy: Secondary | ICD-10-CM | POA: Insufficient documentation

## 2019-11-11 DIAGNOSIS — Z820 Family history of epilepsy and other diseases of the nervous system: Secondary | ICD-10-CM | POA: Insufficient documentation

## 2019-11-11 DIAGNOSIS — Z79810 Long term (current) use of selective estrogen receptor modulators (SERMs): Secondary | ICD-10-CM | POA: Insufficient documentation

## 2019-11-11 DIAGNOSIS — Z923 Personal history of irradiation: Secondary | ICD-10-CM | POA: Diagnosis not present

## 2019-11-11 DIAGNOSIS — Z79899 Other long term (current) drug therapy: Secondary | ICD-10-CM | POA: Diagnosis not present

## 2019-11-11 DIAGNOSIS — Z818 Family history of other mental and behavioral disorders: Secondary | ICD-10-CM | POA: Diagnosis not present

## 2019-11-11 DIAGNOSIS — Z836 Family history of other diseases of the respiratory system: Secondary | ICD-10-CM | POA: Diagnosis not present

## 2019-11-11 DIAGNOSIS — Z833 Family history of diabetes mellitus: Secondary | ICD-10-CM | POA: Diagnosis not present

## 2019-11-11 DIAGNOSIS — C50412 Malignant neoplasm of upper-outer quadrant of left female breast: Secondary | ICD-10-CM | POA: Insufficient documentation

## 2019-11-11 DIAGNOSIS — Z170 Estrogen receptor positive status [ER+]: Secondary | ICD-10-CM | POA: Diagnosis not present

## 2019-11-11 DIAGNOSIS — Z880 Allergy status to penicillin: Secondary | ICD-10-CM | POA: Diagnosis not present

## 2019-11-11 NOTE — Progress Notes (Signed)
CLINIC:  Survivorship   REASON FOR VISIT:  Routine follow-up for history of breast cancer.   BRIEF ONCOLOGIC HISTORY:  Oncology History  Primary cancer of upper outer quadrant of left female breast ([place])  03/12/2012 Surgery   left breast lumpectomy that revealed 2 foci of invasive ductal carcinoma measuring 1.3 cm (ER87%,PR 100% Ki 40%)and 0.8 cm (Er96%, PR 99% Ki 74%)grade 2 with adjacent high-grade ductal carcinoma in situ with no evidence of angiolymphatic invasion.1/22 LN   03/23/2012 - 05/10/2012 Chemotherapy   Adjuvant chemo FEC 100 foll by Taxol X 12   07/10/2012 - 08/23/2012 Radiation Therapy   Adj XRT   09/14/2012 - 10/05/2012 Anti-estrogen oral therapy   Tamoxifen 20 mg 3 weeks stopped because of intolerance      INTERVAL HISTORY:  Alisha Chen presents to the [facility] today for routine follow-up for her history of breast cancer.  Overall, she reports feeling quite well.   Her most recent mammogram was in 10/2018 and showed no mammographic evidence of malignancy and breast density category B.  She is scheduled for a repeat mammogram tomorrow.  Alisha Chen exercises on the elliptical 0-3 times per week for about 30 minutes to an hour.  She also walks where she lives when the weather is nice (not recently due to cold weather).  She has had no health changes this past year other than hyperthyroidism.    She sees her PCP regularly.  She is up to date with her cancer screenings.    REVIEW OF SYSTEMS:  Review of Systems  Constitutional: Negative for appetite change, chills, fatigue, fever and unexpected weight change.  HENT:   Negative for hearing loss, lump/mass, mouth sores and trouble swallowing.   Eyes: Negative for eye problems and icterus.  Respiratory: Negative for chest tightness, cough and shortness of breath.   Cardiovascular: Negative for chest pain, leg swelling and palpitations.  Gastrointestinal: Negative for abdominal distention, abdominal pain,  constipation, diarrhea, nausea and vomiting.  Endocrine: Negative for hot flashes.  Genitourinary: Negative for difficulty urinating.   Musculoskeletal: Negative for arthralgias.  Skin: Negative for itching and rash.  Neurological: Negative for dizziness, headaches and numbness.  Hematological: Negative for adenopathy. Does not bruise/bleed easily.  Psychiatric/Behavioral: Negative for depression. The patient is not nervous/anxious.    Breast: Denies any new nodularity, masses, tenderness, nipple changes, or nipple discharge.       PAST MEDICAL/SURGICAL HISTORY:  Past Medical History:  Diagnosis Date  . Anemia   . Back pain   . Breast cancer ([place])    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,dcis  . History of chemotherapy 03/23/12- 05/10/12   s/p 4 cycles of FEC   . Hx: UTI (urinary tract infection)   . Knee pain   . Lactose intolerance   . Neck pain   . S/P radiation therapy 07/10/12-08/23/12   Left Breast: 50 Gy/25 Fractions; Boost: 10 Gy/5 Fractions  . Status post chemotherapy 1 dose 05/23/12   Stopped after one cycle of Taxol due to Grade 2 Toxicity  . Status post chemotherapy 1 dose 05/23/12   Stopped after one cycle of Taxol due to Grade 2 Toxicity   Past Surgical History:  Procedure Laterality Date  . AXILLARY SURGERY  03/06/12   left, regional resection, 1/3 nodes pos  . BACK SURGERY     LUMBAR DECOMPRESSION  . BREAST BIOPSY  01/09/2012   left breast 3 0'clock/ER?PR =positive,her 2 neg  . BREAST LUMPECTOMY Left 02/10/12   Left  Breast: 2 Foci of Invasive Ductal Caand High grade Ductal Carcinoma In Situ, 0/14 nodes Left Axilla Negative : Regional Resection of Lymph Nodes: 0/5 Nodes Negative  . FOOT SURGERY     multiple  . INTRAUTERINE DEVICE REMOVAL  01/24/12  . needle core Biospy  01/09/12   Left Breast: Invasive Ductal Carcinoma, Lymph Node Axilla: Metastatic Mammary Carcinoma  . PORTACATH PLACEMENT  02/10/2012   Procedure: INSERTION PORT-A-CATH;  Surgeon:  Rolm Bookbinder, MD;  Location: [facility];  Service: General;  Laterality: Right;  . Regional Resection  03/06/12   Left Axilla: 1/3 Nodes Metastatic Carcinoma  . SPINE SURGERY  01/2011     ALLERGIES:  Allergies  Allergen Reactions  . Penicillins Other (See Comments)    Not sure, but was told by mother never to take this medication as a child  . Shellfish-Derived Products Other (See Comments)    Welts  . Clarithromycin Other (See Comments)    REACTION: NAUSEA,WEAK,BITTER TASTE  . Ondansetron Nausea And Vomiting    Headaches     CURRENT MEDICATIONS:  Outpatient Encounter Medications as of 11/11/2019  Medication Sig  . celecoxib (CELEBREX) 100 MG capsule Take 100 mg by mouth 2 (two) times daily.  . cholecalciferol (VITAMIN D) 1000 UNITS tablet Take 1,000 Units by mouth daily.  Marland Kitchen ibuprofen (ADVIL,MOTRIN) 800 MG tablet Take 1 tablet (800 mg total) by mouth 3 (three) times daily.  . Insulin Pen Needle (BD PEN NEEDLE NANO 2ND GEN) 32G X 4 MM MISC Use 1 needle daily to inject Saxenda.  . levothyroxine (SYNTHROID) 50 MCG tablet Take 1 tablet (50 mcg total) by mouth daily.  . Liraglutide -Weight Management (SAXENDA) 18 MG/3ML SOPN Inject 3 mg into the skin daily.  . metFORMIN (GLUCOPHAGE) 500 MG tablet Take 1 tablet (500 mg total) by mouth 3 (three) times daily with meals.  . Misc Natural Products (COLON CLEANSE PO) Take by mouth 3 (three) times daily. 3 pills nightly qhs  . promethazine-dextromethorphan (PROMETHAZINE-DM) 6.25-15 MG/5ML syrup Take 5 mLs by mouth 4 (four) times daily as needed.  . rizatriptan (MAXALT) 5 MG tablet TAKE 1 TABLET BY MOUTH AS NEEDED FOR MIGRAINE. MAY REPEAT IN 2 HOURS IF NEEDED  . vitamin B-12 (CYANOCOBALAMIN) 1000 MCG tablet Take 1,000 mcg by mouth daily.   No facility-administered encounter medications on file as of 11/11/2019.     ONCOLOGIC FAMILY HISTORY:  Family History  Problem Relation Age of Onset  . Diabetes Mother   . Sudden  death Mother   . Schizophrenia Mother   . Sleep apnea Mother   . Obesity Mother   . Seizures Son     GENETIC COUNSELING/TESTING: Not at this time  SOCIAL HISTORY:  Social History   Socioeconomic History  . Marital status: Married    Spouse name: Louie Casa  . Number of children: 1  . Years of education: Not on file  . Highest education level: Not on file  Occupational History  . Occupation: Pharmacist, hospital, Art gallery manager    Employer: [employer]    Comment: [employer] scientific officer: [place]  Tobacco Use  . Smoking status: Never Smoker  . Smokeless tobacco: Never Used  Substance and Sexual Activity  . Alcohol use: No  . Drug use: No  . Sexual activity: Yes    Partners: Male    Birth control/protection: None    Comment: menarche age 33,premenopausa; G!P1,1st pregnancy age 89  Other Topics Concern  . Not on file  Social History Narrative   Married x 21 years. Production assistant, radio at [employer]. 44 ten year old son   Social Determinants of Radio broadcast assistant Strain:   . Difficulty of Paying Living Expenses: Not on file  Food Insecurity:   . Worried About Charity fundraiser in the Last Year: Not on file  . Ran Out of Food in the Last Year: Not on file  Transportation Needs:   . Lack of Transportation (Medical): Not on file  . Lack of Transportation (Non-Medical): Not on file  Physical Activity:   . Days of Exercise per Week: Not on file  . Minutes of Exercise per Session: Not on file  Stress:   . Feeling of Stress : Not on file  Social Connections:   . Frequency of Communication with Friends and Family: Not on file  . Frequency of Social Gatherings with Friends and Family: Not on file  . Attends Religious Services: Not on file  . Active Member of Clubs or Organizations: Not on file  . Attends Archivist Meetings: Not on file  . Marital Status: Not on file  Intimate Partner Violence:   . Fear of Current or Ex-Partner: Not on file  .  Emotionally Abused: Not on file  . Physically Abused: Not on file  . Sexually Abused: Not on file      PHYSICAL EXAMINATION:  Vital Signs: Vitals:   11/11/19 1422  BP: 129/70  Pulse: 75  Resp: 18  Temp: 98.5 F (36.9 C)  SpO2: 96%   Filed Weights   11/11/19 1422  Weight: 198 lb 6.4 oz (90 kg)   General: Well-nourished, well-appearing female in no acute distress.  Unaccompanied today.   HEENT: Head is normocephalic.  Pupils equal and reactive to light. Conjunctivae clear without exudate.  Sclerae anicteric. Oral mucosa is pink, moist.  Oropharynx is pink without lesions or erythema.  Lymph: No cervical, supraclavicular, or infraclavicular lymphadenopathy noted on palpation.  Cardiovascular: Regular rate and rhythm.Marland Kitchen Respiratory: Clear to auscultation bilaterally. Chest expansion symmetric; breathing non-labored.  Breast Exam:  -Left breast: No appreciable masses on palpation. No skin redness, thickening, or peau d'orange appearance; no nipple retraction or nipple discharge; mild distortion in symmetry at previous lumpectomy site well healed scar without erythema or nodularity.  -Right breast: No appreciable masses on palpation. No skin redness, thickening, or peau d'orange appearance; no nipple retraction or nipple discharge. -Axilla: No axillary adenopathy bilaterally.  GI: Abdomen soft and round; non-tender, non-distended. Bowel sounds normoactive. No hepatosplenomegaly.   GU: Deferred.  Neuro: No focal deficits. Steady gait.  Psych: Mood and affect normal and appropriate for situation.  MSK: No focal spinal tenderness to palpation, full range of motion in bilateral upper extremities Extremities: No edema. Skin: Warm and dry.  LABORATORY DATA:  None for this visit   DIAGNOSTIC IMAGING:  Most recent mammogram: scheduled tomorrow, last in 10/2018    ASSESSMENT AND PLAN:  Ms.. Chen is a pleasant 53 y.o. female with history of Stage IIA left breast invasive ductal  carcinoma, ER+/PR+/HER2-, diagnosed in 2013, treated with lumpectomy/ALND, adjuvant chemotherapy, adjuvant radiation therapy, and anti-estrogen therapy with Tamoxifen, stopped after 3 years due to adverse effects.  She [facility] to the [facility] for surveillance and routine follow-up.   1. History of breast cancer:  Alisha Chen is currently clinically and radiographically without evidence of disease or recurrence of breast cancer. She will be due for mammogram in 10/2019; her breast density in 2019  was category B.   She would like to return in one year for continued LTS follow up, which we are happy to accommodate.  I encouraged her to call me with any questions or concerns before her next visit at the cancer center, and I would be happy to see her sooner, if needed.    2.  Bone health:  Given Alisha Chen's history of breast cancer, she is at slight risk for bone demineralization. She completed 3 years on tamoxifen which has a protective effect on the bones.  She was given education on specific food and activities to promote bone health.  3. Cancer screening:  Due to Alisha Chen's history and her age, she should receive screening for skin cancers, colon cancer, and gynecologic cancers. She was encouraged to follow-up with her PCP for appropriate cancer screenings.   4. Health maintenance and wellness promotion: Alisha Chen was encouraged to consume 5-7 servings of fruits and vegetables per day. She was also encouraged to engage in moderate to vigorous exercise for 30 minutes per day most days of the week. She was instructed to limit her alcohol consumption and continue to abstain from tobacco use.  She was recommended skin protection from direct sunlight/tanning beds.    Dispo:  -Return to cancer center in one year for LTS follow up -Mammogram tomorrow   A total of (15) minutes of face-to-face time was spent with this patient with greater than 50% of that time in counseling and  care-coordination.   Gardenia Phlegm, NP Survivorship Program [facility] [phone]   Note: PRIMARY CARE PROVIDER [name], [place] [phone]

## 2019-11-11 NOTE — Progress Notes (Signed)
Office: [phone]  /  Fax: [phone] TeleHealth Visit:  Alisha Chen has verbally consented to this TeleHealth visit today. The patient is located at home, the provider is located at the [employer] [facility] office. The participants in this visit include the listed provider and patient and any and all parties involved. The visit was conducted today via FaceTIme.  HPI:  Chief Complaint: OBESITY Alisha Chen is here to discuss her progress with her obesity treatment plan. She is on the keep a food journal with 1200 calories and 80 grams of protein daily plan and states she is following her eating plan approximately 80 % of the time. She states she is walking 30 minutes 1 to 3 times per week.  Alisha Chen was prescribed Saxenda at the last visit. She started it five days ago. She denies nausea or constipation. She feel it is decreasing appetite and cravings. [name] is getting 80 grams of protein most days and she is keeping her calories below 1300. She is 197.2 pounds today. Luthien admits to not always making good food choices.  Vitamin D deficiency Alisha Chen has a diagnosis of vitamin D deficiency. Her last vitamin D level was at goal, It was 43.1 on 12/10/18. She is on OTC vitamin D3 1,000 IU daily.    ASSESSMENT AND PLAN:  Vitamin D deficiency  Class 1 obesity with serious comorbidity and body mass index (BMI) of 31.0 to 31.9 in adult, unspecified obesity type  PLAN:  Vitamin D Deficiency Low vitamin D level contributes to fatigue and are associated with obesity, breast, and colon cancer. Alisha Chen will have her PCP check vitamin D at the lab draw in February 2021. Alisha Chen will continue to take OTC Vit D @3 ,000 IU daily and she will follow up for routine testing of vitamin D, at least 2-3 times per year to avoid over-replacement.  Obesity [name] is currently in the action stage of change. As such, her goal is to continue with weight loss efforts She has agreed to keep a food journal with 1150 to  1250 calories and 80 grams of protein daily [name] will continue walking for 30 minutes, 1 to 3 times per week for weight loss and overall health benefits. We discussed the following Behavioral Modification Strategies today: planning for success, increasing lean protein intake, decreasing simple carbohydrates and decrease eating out Alisha Chen will continue Saxenda at 0.6 mg subQ daily.  Alisha Chen has agreed to follow up with our clinic in 3 to 4 weeks. She was informed of the importance of frequent follow up visits to maximize her success with intensive lifestyle modifications for her multiple health conditions.  ALLERGIES: Allergies  Allergen Reactions  . Penicillins Other (See Comments)    Not sure, but was told by mother never to take this medication as a child  . Shellfish-Derived Products Other (See Comments)    Welts  . Clarithromycin Other (See Comments)    REACTION: NAUSEA,WEAK,BITTER TASTE  . Ondansetron Nausea And Vomiting    Headaches    MEDICATIONS: Current Outpatient Medications on File Prior to Visit  Medication Sig Dispense Refill  . celecoxib (CELEBREX) 100 MG capsule Take 100 mg by mouth 2 (two) times daily.    . cholecalciferol (VITAMIN D) 1000 UNITS tablet Take 1,000 Units by mouth daily.    Marland Kitchen ibuprofen (ADVIL,MOTRIN) 800 MG tablet Take 1 tablet (800 mg total) by mouth 3 (three) times daily. 21 tablet 0  . Insulin Pen Needle (BD PEN NEEDLE NANO 2ND GEN) 32G X 4  MM MISC Use 1 needle daily to inject Saxenda. 100 each 0  . levothyroxine (SYNTHROID) 50 MCG tablet Take 1 tablet (50 mcg total) by mouth daily. 30 tablet 2  . Liraglutide -Weight Management (SAXENDA) 18 MG/3ML SOPN Inject 3 mg into the skin daily. 5 pen 0  . metFORMIN (GLUCOPHAGE) 500 MG tablet Take 1 tablet (500 mg total) by mouth 3 (three) times daily with meals. 90 tablet 0  . Misc Natural Products (COLON CLEANSE PO) Take by mouth 3 (three) times daily. 3 pills nightly qhs    . promethazine-dextromethorphan  (PROMETHAZINE-DM) 6.25-15 MG/5ML syrup Take 5 mLs by mouth 4 (four) times daily as needed. 118 mL 0  . rizatriptan (MAXALT) 5 MG tablet TAKE 1 TABLET BY MOUTH AS NEEDED FOR MIGRAINE. MAY REPEAT IN 2 HOURS IF NEEDED 10 tablet 1  . vitamin B-12 (CYANOCOBALAMIN) 1000 MCG tablet Take 1,000 mcg by mouth daily.     No current facility-administered medications on file prior to visit.    PAST MEDICAL HISTORY: Past Medical History:  Diagnosis Date  . Anemia   . Back pain   . 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 History of chemotherapy 03/23/12- 05/10/12   s/p 4 cycles of FEC   . Hx: UTI (urinary tract infection)   . Knee pain   . Lactose intolerance   . Neck pain   . S/P radiation therapy 07/10/12-08/23/12   Left Breast: 50 Gy/25 Fractions; Boost: 10 Gy/5 Fractions  . Status post chemotherapy 1 dose 05/23/12   Stopped after one cycle of Taxol due to Grade 2 Toxicity  . Status post chemotherapy 1 dose 05/23/12   Stopped after one cycle of Taxol due to Grade 2 Toxicity    PAST SURGICAL HISTORY: Past Surgical History:  Procedure Laterality Date  . AXILLARY SURGERY  03/06/12   left, regional resection, 1/3 nodes pos  . BACK SURGERY     LUMBAR DECOMPRESSION  . BREAST BIOPSY  01/09/2012   left breast 3 0'clock/ER?PR =positive,her 2 neg  . BREAST LUMPECTOMY Left 02/10/12   Left Breast: 2 Foci of Invasive Ductal Caand High grade Ductal Carcinoma In Situ, 0/14 nodes Left Axilla Negative : Regional Resection of Lymph Nodes: 0/5 Nodes Negative  . FOOT SURGERY     multiple  . INTRAUTERINE DEVICE REMOVAL  01/24/12  . needle core Biospy  01/09/12   Left Breast: Invasive Ductal Carcinoma, Lymph Node Axilla: Metastatic Mammary Carcinoma  . PORTACATH PLACEMENT  02/10/2012   Procedure: INSERTION PORT-A-CATH;  Surgeon: Rolm Bookbinder, MD;  Location: [facility];  Service: General;  Laterality: Right;  . Regional Resection  03/06/12   Left Axilla: 1/3  Nodes Metastatic Carcinoma  . SPINE SURGERY  01/2011    SOCIAL HISTORY: Social History   Tobacco Use  . Smoking status: Never Smoker  . Smokeless tobacco: Never Used  Substance Use Topics  . Alcohol use: No  . Drug use: No    FAMILY HISTORY: Family History  Problem Relation Age of Onset  . Diabetes Mother   . Sudden death Mother   . Schizophrenia Mother   . Sleep apnea Mother   . Obesity Mother   . Seizures Son     ROS: Review of Systems  Constitutional: Positive for weight loss.    PHYSICAL EXAM: Last menstrual period 02/27/2012. There is no height or weight on file to calculate BMI. Physical Exam Vitals reviewed.  Constitutional:  General: She is not in acute distress.    Appearance: Normal appearance. She is well-developed. She is obese.  Cardiovascular:     Rate and Rhythm: Normal rate.  Pulmonary:     Effort: Pulmonary effort is normal.  Musculoskeletal:        General: Normal range of motion.  Skin:    General: Skin is warm and dry.  Neurological:     Mental Status: She is alert and oriented to person, place, and time.  Psychiatric:        Mood and Affect: Mood normal.        Behavior: Behavior normal.     RECENT LABS AND TESTS: BMET    Component Value Date/Time   NA 141 10/21/2019 1610   NA 140 12/25/2014 1349   K 4.6 10/21/2019 1610   K 4.4 12/25/2014 1349   CL 104 10/21/2019 1610   CL 105 12/21/2012 1527   CO2 25 10/21/2019 1610   CO2 27 12/25/2014 1349   GLUCOSE 82 10/21/2019 1610   GLUCOSE 71 12/25/2014 1349   GLUCOSE 75 12/21/2012 1527   BUN 17 10/21/2019 1610   BUN 22.5 12/25/2014 1349   CREATININE 0.88 10/21/2019 1610   CREATININE 0.9 12/25/2014 1349   CALCIUM 11.1 (H) 10/21/2019 1610   CALCIUM 10.2 12/25/2014 1349   GFRNONAA 75 10/21/2019 1610   GFRAA 86 10/21/2019 1610   Lab Results  Component Value Date   HGBA1C 4.8 10/21/2019   HGBA1C 4.9 12/10/2018   Lab Results  Component Value Date   INSULIN 16.6 12/10/2018     CBC    Component Value Date/Time   WBC 5.7 10/21/2019 1610   WBC 5.1 12/25/2014 1348   WBC 4.0 08/14/2014 0933   RBC 4.44 10/21/2019 1610   RBC 4.03 12/25/2014 1348   RBC 4.32 08/14/2014 0933   HGB 12.6 10/21/2019 1610   HGB 11.7 12/25/2014 1348   HCT 38.8 10/21/2019 1610   HCT 35.9 12/25/2014 1348   PLT 244 10/21/2019 1610   MCV 87 10/21/2019 1610   MCV 89.1 12/25/2014 1348   MCH 28.4 10/21/2019 1610   MCH 29.0 12/25/2014 1348   MCH 27.1 02/09/2012 0900   MCHC 32.5 10/21/2019 1610   MCHC 32.6 12/25/2014 1348   MCHC 33.1 08/14/2014 0933   RDW 12.0 10/21/2019 1610   RDW 12.6 12/25/2014 1348   LYMPHSABS 1.3 12/10/2018 1151   LYMPHSABS 1.6 12/25/2014 1348   MONOABS 0.3 12/25/2014 1348   EOSABS 0.1 12/10/2018 1151   BASOSABS 0.1 12/10/2018 1151   BASOSABS 0.0 12/25/2014 1348   Iron/TIBC/Ferritin/ %Sat No results found for: IRON, TIBC, FERRITIN, IRONPCTSAT Lipid Panel     Component Value Date/Time   CHOL 215 (H) 10/21/2019 1610   TRIG 115 10/21/2019 1610   HDL 54 10/21/2019 1610   CHOLHDL 4.0 10/21/2019 1610   CHOLHDL 3 08/14/2014 0933   VLDL 9.6 08/14/2014 0933   LDLCALC 140 (H) 10/21/2019 1610   Hepatic Function Panel     Component Value Date/Time   PROT 7.3 10/21/2019 1610   PROT 6.9 12/25/2014 1349   ALBUMIN 4.6 10/21/2019 1610   ALBUMIN 3.7 12/25/2014 1349   AST 18 10/21/2019 1610   AST 19 12/25/2014 1349   ALT 16 10/21/2019 1610   ALT 19 12/25/2014 1349   ALKPHOS 121 (H) 10/21/2019 1610   ALKPHOS 116 12/25/2014 1349   BILITOT <0.2 10/21/2019 1610   BILITOT 0.28 12/25/2014 1349   BILIDIR 0.1 08/14/2014 0933  Component Value Date/Time   TSH 7.100 (H) 10/21/2019 1610   TSH 4.760 (H) 12/10/2018 1151   TSH 3.50 08/14/2014 0933     Ref. Range 12/10/2018 11:51  Vitamin D, 25-Hydroxy Latest Ref Range: 30.0 - 100.0 ng/mL 43.1    I, Doreene Nest, am acting as Location manager for [employer], FNP-C.  I have reviewed the above documentation  for accuracy and completeness, and I agree with the above.  - Itzayana Pardy, FNP-C.

## 2019-11-12 ENCOUNTER — Ambulatory Visit
Admission: RE | Admit: 2019-11-12 | Discharge: 2019-11-12 | Disposition: A | Discharge status: Home / Self Care | Payer: BC Managed Care – PPO | Source: Ambulatory Visit | Attending: Hematology and Oncology | Admitting: Hematology and Oncology

## 2019-11-12 DIAGNOSIS — Z1231 Encounter for screening mammogram for malignant neoplasm of breast: Secondary | ICD-10-CM

## 2019-12-02 ENCOUNTER — Telehealth (INDEPENDENT_AMBULATORY_CARE_PROVIDER_SITE_OTHER): Payer: BC Managed Care – PPO | Admitting: Family Medicine

## 2019-12-02 ENCOUNTER — Other Ambulatory Visit: Payer: Self-pay

## 2019-12-02 DIAGNOSIS — E669 Obesity, unspecified: Secondary | ICD-10-CM

## 2019-12-02 DIAGNOSIS — F3289 Other specified depressive episodes: Secondary | ICD-10-CM | POA: Diagnosis not present

## 2019-12-02 DIAGNOSIS — E8881 Metabolic syndrome: Secondary | ICD-10-CM | POA: Diagnosis not present

## 2019-12-02 DIAGNOSIS — Z6831 Body mass index (BMI) 31.0-31.9, adult: Secondary | ICD-10-CM | POA: Diagnosis not present

## 2019-12-04 NOTE — Progress Notes (Signed)
TeleHealth Visit:  Due to the COVID-19 pandemic, this visit was completed with telemedicine (audio/video) technology to reduce patient and provider exposure as well as to preserve personal protective equipment.   Alisha Chen has verbally consented to this TeleHealth visit. The patient is located at home, the provider is located at the [employer] [facility] office. The participants in this visit include the listed provider and patient and any and all parties involved. The visit was conducted today via FaceTime.  Chief Complaint: OBESITY Alisha Chen is here to discuss her progress with her obesity treatment plan along with follow-up of her obesity related diagnoses. Alisha Chen is keeping a food journal of 1200 calories and 80 grams of protein daily and states she is following her eating plan approximately 50% of the time. Alisha Chen states she is walking and exercising with the elliptical 30 to 60 minutes 2 times per week.  Today's visit was #: 21 Starting weight: 201 lbs Starting date: 12/10/2018  Interim History: Alisha Chen is journaling consistently, but she does not always add all of the food she eats. She is unsure if Saxenda helps with appetite at times. Georgett reports occasional constipation with Saxenda, and she is concerned about increasing the dose.  Subjective:   Insulin resistance Alisha Chen is taking metformin with meals and she feels it helps with polyphagia. She denies nausea or diarrhea.  Other depression, with emotional eating Alisha Chen is unsure why she tends to do well on the plan at times, but then seems to sabotage herself as she has told me at prior visits.  Assessment/Plan:   Insulin resistance [name] will continue to work on weight loss, exercise, and decreasing simple carbohydrates to help decrease the risk of diabetes. [name] will continue metformin (no refill needed), and follow-up with [place] as directed to closely monitor her progress.  Other depression, with emotional  eating Behavior modification techniques were discussed today to help [name] deal with her emotional/non-hunger eating behaviors. I suggested a referral to Dr. Mallie Mussel and she will consider this, but she declines at this time. Orders and follow up as documented in patient record.   Obesity Alisha Chen is currently in the action stage of change. As such, her goal is to continue with weight loss efforts. She has agreed to keeping a food journal and adhering to recommended goals of 1150 to 1250 calories and 80 grams of protein daily.   Alisha Chen was instructed to increase exercise to 150 minutes per week.  We discussed the following behavioral modification strategies today: increasing lean protein intake, decreasing simple carbohydrates, increasing vegetables, increasing water intake, planning for success and keeping a strict food journal.  We discussed using MyFitnessPal and how to scan bar codes. She will continue Saxenda 0.6 mg daily.  Alisha Chen has agreed to follow-up with our clinic in 2 weeks. She was informed of the importance of frequent follow-up visits to maximize her success with intensive lifestyle modifications for her multiple health conditions.  Objective:   VITALS: Per patient if applicable, see vitals. GENERAL: Alert and in no acute distress. CARDIOPULMONARY: No increased WOB. Speaking in clear sentences.  PSYCH: Pleasant and cooperative. Speech normal rate and rhythm. Affect is appropriate. Insight and judgement are appropriate. Attention is focused, linear, and appropriate.  NEURO: Oriented as arrived to appointment on time with no prompting.   Lab Results  Component Value Date   CREATININE 0.88 10/21/2019   BUN 17 10/21/2019   NA 141 10/21/2019   K 4.6 10/21/2019  CL 104 10/21/2019   CO2 25 10/21/2019   Lab Results  Component Value Date   ALT 16 10/21/2019   AST 18 10/21/2019   ALKPHOS 121 (H) 10/21/2019   BILITOT <0.2 10/21/2019   Lab Results  Component Value Date     HGBA1C 4.8 10/21/2019   HGBA1C 4.9 12/10/2018   Lab Results  Component Value Date   INSULIN 16.6 12/10/2018   Lab Results  Component Value Date   TSH 7.100 (H) 10/21/2019   Lab Results  Component Value Date   CHOL 215 (H) 10/21/2019   HDL 54 10/21/2019   LDLCALC 140 (H) 10/21/2019   TRIG 115 10/21/2019   CHOLHDL 4.0 10/21/2019   Lab Results  Component Value Date   WBC 5.7 10/21/2019   HGB 12.6 10/21/2019   HCT 38.8 10/21/2019   MCV 87 10/21/2019   PLT 244 10/21/2019   No results found for: IRON, TIBC, FERRITIN    Ref. Range 12/10/2018 11:51  Vitamin D, 25-Hydroxy  Latest Ref Range: 30.0 - 100.0 ng/mL 43.1    Attestation Statements:   Reviewed by clinician on day of visit: allergies, medications, problem list, medical history, surgical history, family history, social history, and previous encounter notes.  Corey Skains, am acting as Location manager for [employer], FNP-C.  I have reviewed the above documentation for accuracy and completeness, and I agree with the above. - Georgianne Fick, FNP

## 2019-12-13 ENCOUNTER — Encounter (INDEPENDENT_AMBULATORY_CARE_PROVIDER_SITE_OTHER): Payer: Self-pay | Admitting: Family Medicine

## 2019-12-16 ENCOUNTER — Encounter (INDEPENDENT_AMBULATORY_CARE_PROVIDER_SITE_OTHER): Payer: Self-pay | Admitting: Family Medicine

## 2019-12-16 ENCOUNTER — Other Ambulatory Visit: Payer: Self-pay

## 2019-12-16 ENCOUNTER — Telehealth (INDEPENDENT_AMBULATORY_CARE_PROVIDER_SITE_OTHER): Payer: BC Managed Care – PPO | Admitting: Family Medicine

## 2019-12-16 DIAGNOSIS — E8881 Metabolic syndrome: Secondary | ICD-10-CM | POA: Diagnosis not present

## 2019-12-16 DIAGNOSIS — Z6832 Body mass index (BMI) 32.0-32.9, adult: Secondary | ICD-10-CM

## 2019-12-16 DIAGNOSIS — E669 Obesity, unspecified: Secondary | ICD-10-CM

## 2019-12-16 NOTE — Telephone Encounter (Signed)
Please advise 

## 2019-12-17 NOTE — Progress Notes (Signed)
TeleHealth Visit:  Due to the COVID-19 pandemic, this visit was completed with telemedicine (audio/video) technology to reduce patient and provider exposure as well as to preserve personal protective equipment.   Alisha Chen has verbally consented to this TeleHealth visit. The patient is located at home, the provider is located at the [employer] [facility] office. The participants in this visit include the listed provider and patient and any and all parties involved. The visit was conducted today via FaceTime.  Chief Complaint: OBESITY Alisha Chen is here to discuss her progress with her obesity treatment plan along with follow-up of her obesity related diagnoses. Alisha Chen is on keeping a food journal of 1150 to 1250 calories and 80 grams of protein daily plan and states she is following her eating plan approximately 50% of the time. Alisha Chen states she is exercising with the elliptical 30 to 60 minutes 3 times per week.  Today's visit was #: 22 Starting weight: 201 lbs Starting date: 12/10/2018  Interim History: Alisha Chen reports she is averaging two large meals per day. She goes over on calories some days. She is meeting her protein goals most days. She is on Saxenda 0.6 mg daily. Her weight is 197 pounds today. She feels she has lost weight.  Subjective:   Insulin resistance Alisha Chen has a diagnosis of insulin resistance based on her elevated fasting insulin level >5. She reports that metformin is causing a metallic taste in her mouth. She denies polyphagia. Alisha Chen is on Saxenda for her appetite. She continues to work on diet and exercise to decrease her risk of diabetes.  Lab Results  Component Value Date   INSULIN 16.6 12/10/2018   Lab Results  Component Value Date   HGBA1C 4.8 10/21/2019   Assessment/Plan:   Insulin resistance Alisha Chen will continue to work on weight loss, exercise, and decreasing simple carbohydrates to help decrease the risk of diabetes. Alisha Chen agreed to discontinue  metformin and follow-up with [place] as directed to closely monitor her progress.  Class 1 obesity with serious comorbidity and body mass index (BMI) of 32.0 to 32.9 in adult, unspecified obesity type Alisha Chen is currently in the action stage of change. As such, her goal is to continue with weight loss efforts. She has agreed to keeping a food journal and adhering to recommended goals of 1150 to 1250 calories and 80 grams of protein daily.   Exercise goals: Alisha Chen will continue her current exercise regimen and she will add abdominal exercises 2 times per week.  Behavioral modification strategies: increasing lean protein intake, planning for success and keeping a strict food journal.  Alisha Chen will continue Saxenda at 0.6 mg sub Q daily.  Alisha Chen has agreed to follow-up with our clinic in 2 weeks. She was informed of the importance of frequent follow-up visits to maximize her success with intensive lifestyle modifications for her multiple health conditions.  Objective:   VITALS: Per patient if applicable, see vitals. GENERAL: Alert and in no acute distress. CARDIOPULMONARY: No increased WOB. Speaking in clear sentences.  PSYCH: Pleasant and cooperative. Speech normal rate and rhythm. Affect is appropriate. Insight and judgement are appropriate. Attention is focused, linear, and appropriate.  NEURO: Oriented as arrived to appointment on time with no prompting.   Lab Results  Component Value Date   CREATININE 0.88 10/21/2019   BUN 17 10/21/2019   NA 141 10/21/2019   K 4.6 10/21/2019   CL 104 10/21/2019   CO2 25 10/21/2019   Lab Results  Component Value Date  ALT 16 10/21/2019   AST 18 10/21/2019   ALKPHOS 121 (H) 10/21/2019   BILITOT <0.2 10/21/2019   Lab Results  Component Value Date   HGBA1C 4.8 10/21/2019   HGBA1C 4.9 12/10/2018   Lab Results  Component Value Date   INSULIN 16.6 12/10/2018   Lab Results  Component Value Date   TSH 7.100 (H) 10/21/2019   Lab Results  Component  Value Date   CHOL 215 (H) 10/21/2019   HDL 54 10/21/2019   LDLCALC 140 (H) 10/21/2019   TRIG 115 10/21/2019   CHOLHDL 4.0 10/21/2019   Lab Results  Component Value Date   WBC 5.7 10/21/2019   HGB 12.6 10/21/2019   HCT 38.8 10/21/2019   MCV 87 10/21/2019   PLT 244 10/21/2019   No results found for: IRON, TIBC, FERRITIN   Ref. Range 12/10/2018 11:51  Vitamin D, 25-Hydroxy Latest Ref Range: 30.0 - 100.0 ng/mL 43.1   Attestation Statements:   Reviewed by clinician on day of visit: allergies, medications, problem list, medical history, surgical history, family history, social history, and previous encounter notes.  Corey Skains, am acting as Location manager for [employer], FNP-C.  I have reviewed the above documentation for accuracy and completeness, and I agree with the above. - Terriah Reggio [employer], FNP-C

## 2019-12-24 ENCOUNTER — Telehealth: Payer: Self-pay | Admitting: *Deleted

## 2019-12-24 ENCOUNTER — Other Ambulatory Visit: Payer: Self-pay | Admitting: Family Medicine

## 2019-12-24 DIAGNOSIS — R7989 Other specified abnormal findings of blood chemistry: Secondary | ICD-10-CM

## 2019-12-24 NOTE — Telephone Encounter (Signed)
Order in.

## 2019-12-24 NOTE — Telephone Encounter (Signed)
Pt has lab appt with [place] tomorrow but I do not see any future orders. I see labs ordered 10/24/19 (PTH w/calcium and Thyroid panel w/TSH) but they are not future.  Please confirm or place appropriate lab orders. Thank you!

## 2019-12-25 ENCOUNTER — Other Ambulatory Visit (INDEPENDENT_AMBULATORY_CARE_PROVIDER_SITE_OTHER): Payer: BC Managed Care – PPO

## 2019-12-25 ENCOUNTER — Telehealth: Payer: Self-pay | Admitting: Family Medicine

## 2019-12-25 ENCOUNTER — Other Ambulatory Visit: Payer: Self-pay

## 2019-12-25 DIAGNOSIS — R7989 Other specified abnormal findings of blood chemistry: Secondary | ICD-10-CM | POA: Diagnosis not present

## 2019-12-25 LAB — COMPREHENSIVE METABOLIC PANEL
ALT: 14 U/L (ref 0–35)
AST: 15 U/L (ref 0–37)
Albumin: 4.2 g/dL (ref 3.5–5.2)
Alkaline Phosphatase: 102 U/L (ref 39–117)
BUN: 16 mg/dL (ref 6–23)
CO2: 29 mEq/L (ref 19–32)
Calcium: 10.7 mg/dL — ABNORMAL HIGH (ref 8.4–10.5)
Chloride: 104 mEq/L (ref 96–112)
Creatinine, Ser: 0.92 mg/dL (ref 0.40–1.20)
GFR: 76.8 mL/min (ref >60.00)
Glucose, Bld: 72 mg/dL (ref 70–99)
Potassium: 4.8 mEq/L (ref 3.5–5.1)
Sodium: 140 mEq/L (ref 135–145)
Total Bilirubin: 0.4 mg/dL (ref 0.2–1.2)
Total Protein: 7 g/dL (ref 6.0–8.3)

## 2019-12-26 LAB — THYROID PANEL WITH TSH
Free Thyroxine Index: 2.7 (ref 1.4–3.8)
T3 Uptake: 30 % (ref 22–35)
T4, Total: 8.9 ug/dL (ref 5.1–11.9)
TSH: 3.59 mIU/L

## 2019-12-26 LAB — PARATHYROID HORMONE, INTACT (NO CA): PTH: 33 pg/mL (ref 14–64)

## 2019-12-27 ENCOUNTER — Encounter: Payer: Self-pay | Admitting: Family Medicine

## 2019-12-27 ENCOUNTER — Other Ambulatory Visit: Payer: Self-pay

## 2019-12-27 MED ORDER — LEVOTHYROXINE SODIUM 50 MCG PO TABS: 50.0000 ug | ORAL_TABLET | Freq: Every day | ORAL | 5 refills | Status: DC

## 2019-12-30 ENCOUNTER — Telehealth (INDEPENDENT_AMBULATORY_CARE_PROVIDER_SITE_OTHER): Payer: BC Managed Care – PPO | Admitting: Family Medicine

## 2019-12-30 ENCOUNTER — Encounter (INDEPENDENT_AMBULATORY_CARE_PROVIDER_SITE_OTHER): Payer: Self-pay | Admitting: Family Medicine

## 2019-12-30 ENCOUNTER — Other Ambulatory Visit: Payer: Self-pay

## 2019-12-30 DIAGNOSIS — E669 Obesity, unspecified: Secondary | ICD-10-CM

## 2019-12-30 DIAGNOSIS — E8881 Metabolic syndrome: Secondary | ICD-10-CM | POA: Diagnosis not present

## 2019-12-30 DIAGNOSIS — Z6832 Body mass index (BMI) 32.0-32.9, adult: Secondary | ICD-10-CM

## 2019-12-31 NOTE — Progress Notes (Signed)
TeleHealth Visit:  Due to the COVID-19 pandemic, this visit was completed with telemedicine (audio/video) technology to reduce patient and provider exposure as well as to preserve personal protective equipment.   Alisha Chen has verbally consented to this TeleHealth visit. The patient is located at home, the provider is located at the [facility] office. The participants in this visit include the listed provider and patient and any and all parties involved. The visit was conducted today via telephone.  Alisha Chen was unable to use realtime audiovisual technology today (Facetime converted to phone call) and the telehealth visit was conducted via telephone 19 minute call).  Chief Complaint: OBESITY Alisha Chen is here to discuss her progress with her obesity treatment plan along with follow-up of her obesity related diagnoses. Alisha Chen is on the Category 2 Plan and keeping a food journal of 1150 to 1250 calories and 80 grams of protein daily plan and states she is following her eating plan approximately 60% of the time. Alisha Chen states she is exercising with the elliptical 30 minutes 1 time per week.  Today's visit was #: 23 Starting weight: 201 lbs Starting date: 12/10/2018  Interim History: [name]'s weight is maintained at 197 pounds. She eats all of her calories between lunch and dinner and tends to skip breakfast. She reports once she eats in the am she becomes more hungry.   Subjective:   Insulin resistance Alisha Chen has a diagnosis of insulin resistance based on her elevated fasting insulin level >5. She is not on metformin. Alisha Chen denies polyphagia (taking [place]). She continues to work on diet and exercise to decrease her risk of diabetes.  Lab Results  Component Value Date   INSULIN 16.6 12/10/2018   Lab Results  Component Value Date   HGBA1C 4.8 10/21/2019    Assessment/Plan:   Insulin resistance Tria will continue with the meal plan and she will continue to work on  weight loss, exercise, and decreasing simple carbohydrates to help decrease the risk of diabetes. Anton agreed to follow-up with [place] as directed to closely monitor her progress.  Class 1 obesity with serious comorbidity and body mass index (BMI) of 32.0 to 32.9 in adult, unspecified obesity type Alisha Chen is currently in the action stage of change. As such, her goal is to continue with weight loss efforts. She has agreed to the Category 2 Plan.   Exercise goals: Alisha Chen will increase elliptical session to 2 times per week.  Behavioral modification strategies: increasing lean protein intake, better snacking choices and dealing with family or coworker sabotage. Alisha Chen will have 40 grams of protein at each of her two meals. She will continue Saxenda 0.6 mg daily.  Alisha Chen has agreed to follow-up with our clinic in 2 weeks. She was informed of the importance of frequent follow-up visits to maximize her success with intensive lifestyle modifications for her multiple health conditions.  Objective:   VITALS: Per patient if applicable, see vitals. GENERAL: Alert and in no acute distress. CARDIOPULMONARY: No increased WOB. Speaking in clear sentences.  PSYCH: Pleasant and cooperative. Speech normal rate and rhythm. Affect is appropriate. Insight and judgement are appropriate. Attention is focused, linear, and appropriate.  NEURO: Oriented as arrived to appointment on time with no prompting.   Lab Results  Component Value Date   CREATININE 0.92 12/25/2019   BUN 16 12/25/2019   NA 140 12/25/2019   K 4.8 12/25/2019   CL 104 12/25/2019   CO2 29 12/25/2019   Lab Results  Component Value  Date   ALT 14 12/25/2019   AST 15 12/25/2019   ALKPHOS 102 12/25/2019   BILITOT 0.4 12/25/2019   Lab Results  Component Value Date   HGBA1C 4.8 10/21/2019   HGBA1C 4.9 12/10/2018   Lab Results  Component Value Date   INSULIN 16.6 12/10/2018   Lab Results  Component Value Date   TSH 3.59 12/25/2019   Lab  Results  Component Value Date   CHOL 215 (H) 10/21/2019   HDL 54 10/21/2019   LDLCALC 140 (H) 10/21/2019   TRIG 115 10/21/2019   CHOLHDL 4.0 10/21/2019   Lab Results  Component Value Date   WBC 5.7 10/21/2019   HGB 12.6 10/21/2019   HCT 38.8 10/21/2019   MCV 87 10/21/2019   PLT 244 10/21/2019   No results found for: IRON, TIBC, FERRITIN    Ref. Range 12/10/2018 11:51  Vitamin D, 25-Hydroxy Latest Ref Range: 30.0 - 100.0 ng/mL 43.1    Attestation Statements:   Reviewed by clinician on day of visit: allergies, medications, problem list, medical history, surgical history, family history, social history, and previous encounter notes.  Corey Skains, am acting as Location manager for [employer], FNP-C.  I have reviewed the above documentation for accuracy and completeness, and I agree with the above. - Kylie Gros [employer], FNP-C

## 2020-01-13 ENCOUNTER — Telehealth (INDEPENDENT_AMBULATORY_CARE_PROVIDER_SITE_OTHER): Payer: BC Managed Care – PPO | Admitting: Family Medicine

## 2020-01-13 ENCOUNTER — Encounter (INDEPENDENT_AMBULATORY_CARE_PROVIDER_SITE_OTHER): Payer: Self-pay | Admitting: Family Medicine

## 2020-01-13 ENCOUNTER — Other Ambulatory Visit: Payer: Self-pay

## 2020-01-13 DIAGNOSIS — F3289 Other specified depressive episodes: Secondary | ICD-10-CM

## 2020-01-13 DIAGNOSIS — E669 Obesity, unspecified: Secondary | ICD-10-CM | POA: Diagnosis not present

## 2020-01-13 DIAGNOSIS — Z6832 Body mass index (BMI) 32.0-32.9, adult: Secondary | ICD-10-CM | POA: Diagnosis not present

## 2020-01-14 NOTE — Progress Notes (Signed)
TeleHealth Visit:  Due to the COVID-19 pandemic, this visit was completed with telemedicine (audio/video) technology to reduce patient and provider exposure as well as to preserve personal protective equipment.   Alisha Chen has verbally consented to this TeleHealth visit. The patient is located at work, the provider is located at the [facility] office. The participants in this visit include the listed provider and patient and any and all parties involved. The visit was conducted today via FaceTime.   Chief Complaint: OBESITY Alisha Chen is here to discuss her progress with her obesity treatment plan along with follow-up of her obesity related diagnoses. Alisha Chen is on the Category 2 Plan and keeping a food journal of 1150 to 1250 calories and 80 grams of protein daily plan and states she is following her eating plan approximately 50% of the time. Alisha Chen states she is exercising with the elliptical 30 minutes 3 times per week.  Today's visit was #: 24 Starting weight: 201 lbs Starting date: 12/10/2018  Interim History: Alisha Chen is falling short on her protein goals. She is sometimes skipping a meal or eating out, which throws her off her goals. She feels she has gained weight. Alisha Chen feels journaling is the best option for her after we discussed switching back to a category plan. She is on 0.6 mg of Saxenda daily.  Subjective:   Other depression, with emotional eating Alisha Chen is struggling with emotional eating and using food for comfort to the extent that it is negatively impacting her health. She feels she sometimes makes poor food choices at the end of her work day, when she is hungry and tired. She has been working on behavior modification techniques to help reduce her emotional eating and has been somewhat successful. She shows no sign of suicidal or homicidal ideations.  Assessment/Plan:   Other depression, with emotional eating We discussed setting her up with Dr. Mallie Mussel, our  bariatric psychologist, and she will consider this.   Class 1 obesity with serious comorbidity and body mass index (BMI) of 32.0 to 32.9 in adult, unspecified obesity type Alisha Chen is currently in the action stage of change. As such, her goal is to continue with weight loss efforts. She has agreed to keeping a food journal and adhering to recommended goals of 1150 to 1250 calories and 80 grams of protein daily.   Exercise goals: Alisha Chen will continue her current exercise regimen.  Behavioral modification strategies: increasing lean protein intake, meal planning and cooking strategies and planning for success.  Alisha Chen will continue 0.6 mg of Saxenda daily.  Alisha Chen has agreed to follow-up with our clinic in 2 weeks. She was informed of the importance of frequent follow-up visits to maximize her success with intensive lifestyle modifications for her multiple health conditions.  Objective:   VITALS: Per patient if applicable, see vitals. GENERAL: Alert and in no acute distress. CARDIOPULMONARY: No increased WOB. Speaking in clear sentences.  PSYCH: Pleasant and cooperative. Speech normal rate and rhythm. Affect is appropriate. Insight and judgement are appropriate. Attention is focused, linear, and appropriate.  NEURO: Oriented as arrived to appointment on time with no prompting.   Lab Results  Component Value Date   CREATININE 0.92 12/25/2019   BUN 16 12/25/2019   NA 140 12/25/2019   K 4.8 12/25/2019   CL 104 12/25/2019   CO2 29 12/25/2019   Lab Results  Component Value Date   ALT 14 12/25/2019   AST 15 12/25/2019   ALKPHOS 102 12/25/2019  BILITOT 0.4 12/25/2019   Lab Results  Component Value Date   HGBA1C 4.8 10/21/2019   HGBA1C 4.9 12/10/2018   Lab Results  Component Value Date   INSULIN 16.6 12/10/2018   Lab Results  Component Value Date   TSH 3.59 12/25/2019   Lab Results  Component Value Date   CHOL 215 (H) 10/21/2019   HDL 54 10/21/2019   LDLCALC 140 (H) 10/21/2019     TRIG 115 10/21/2019   CHOLHDL 4.0 10/21/2019   Lab Results  Component Value Date   WBC 5.7 10/21/2019   HGB 12.6 10/21/2019   HCT 38.8 10/21/2019   MCV 87 10/21/2019   PLT 244 10/21/2019   No results found for: IRON, TIBC, FERRITIN   Ref. Range 12/10/2018 11:51  Vitamin D, 25-Hydroxy Latest Ref Range: 30.0 - 100.0 ng/mL 43.1   Attestation Statements:   Reviewed by clinician on day of visit: allergies, medications, problem list, medical history, surgical history, family history, social history, and previous encounter notes.  Corey Skains, am acting as Location manager for [employer], FNP-C.  I have reviewed the above documentation for accuracy and completeness, and I agree with the above. - Obed Samek [employer], FNP-C

## 2020-01-21 ENCOUNTER — Other Ambulatory Visit: Payer: Self-pay | Admitting: Family Medicine

## 2020-01-21 ENCOUNTER — Encounter: Payer: Self-pay | Admitting: Family Medicine

## 2020-01-21 DIAGNOSIS — Z8616 Personal history of COVID-19: Secondary | ICD-10-CM

## 2020-01-27 ENCOUNTER — Other Ambulatory Visit: Payer: Self-pay

## 2020-01-27 ENCOUNTER — Encounter (INDEPENDENT_AMBULATORY_CARE_PROVIDER_SITE_OTHER): Payer: Self-pay | Admitting: Family Medicine

## 2020-01-27 ENCOUNTER — Telehealth (INDEPENDENT_AMBULATORY_CARE_PROVIDER_SITE_OTHER): Payer: BC Managed Care – PPO | Admitting: Family Medicine

## 2020-01-27 DIAGNOSIS — E669 Obesity, unspecified: Secondary | ICD-10-CM | POA: Diagnosis not present

## 2020-01-27 DIAGNOSIS — Z6832 Body mass index (BMI) 32.0-32.9, adult: Secondary | ICD-10-CM

## 2020-01-27 DIAGNOSIS — F3289 Other specified depressive episodes: Secondary | ICD-10-CM

## 2020-01-27 NOTE — Progress Notes (Signed)
TeleHealth Visit:  Due to the COVID-19 pandemic, this visit was completed with telemedicine (audio/video) technology to reduce patient and provider exposure as well as to preserve personal protective equipment.   Alisha Chen has verbally consented to this TeleHealth visit. The patient is located at work, the provider is located at the [facility] office. The participants in this visit include the listed provider and patient and any and all parties involved. The visit was conducted today via FaceTime.  Chief Complaint: OBESITY Alisha Chen is here to discuss her progress with her obesity treatment plan along with follow-up of her obesity related diagnoses. Alisha Chen is on keeping a food journal and adhering to recommended goals of 1200 calories and 80 grams of protein daily and she is following the Category 2 plan and states she is following her eating plan approximately 70% of the time. Alisha Chen states she is exercising with the elliptical for 30 to 60 minutes 3 times per week.  Today's visit was #: 25 Starting weight: 201 lbs Starting date: 12/10/2018  Interim History: Alisha Chen reports that she has found an exercising companion and she is exercising more. She is having protein shakes for breakfast. Alisha Chen is journaling daily. She is working hard to get protein in while meeting her calorie goals. She is eating out less. Alisha Chen has maintained her weight at 198 pounds. Alisha Chen is on 0.6 mg daily of Saxenda and feels this provides appetite suppression.  Subjective:   Other depression, with emotional eating  She denies stress eating at this time. She denies excessive cravings.She feels her issue boils down to poor food choices.  She has been working on behavior modification techniques to help reduce her emotional eating and has been somewhat successful. She shows no sign of suicidal or homicidal ideations.  Assessment/Plan:   Other depression, with emotional eating Alisha Chen will continue to work  to make good food choices. Orders and follow up as documented in patient record.   Class 1 obesity with serious comorbidity and body mass index (BMI) of 32.0 to 32.9 in adult, unspecified obesity type Alisha Chen is currently in the action stage of change. As such, her goal is to continue with weight loss efforts. She has agreed to keeping a food journal and adhering to recommended goals of 1150 to 1250 calories and 80 grams of protein daily.   Exercise goals: Alisha Chen will continue her current exercise regimen.  Behavioral modification strategies: increasing lean protein intake, decreasing simple carbohydrates and planning for success. Alisha Chen will continue 0.6 mg of Saxenda daily.  Alisha Chen has agreed to follow-up with our clinic in 2 weeks. She was informed of the importance of frequent follow-up visits to maximize her success with intensive lifestyle modifications for her multiple health conditions.  Objective:   VITALS: Per patient if applicable, see vitals. GENERAL: Alert and in no acute distress. CARDIOPULMONARY: No increased WOB. Speaking in clear sentences.  PSYCH: Pleasant and cooperative. Speech normal rate and rhythm. Affect is appropriate. Insight and judgement are appropriate. Attention is focused, linear, and appropriate.  NEURO: Oriented as arrived to appointment on time with no prompting.   Lab Results  Component Value Date   CREATININE 0.92 12/25/2019   BUN 16 12/25/2019   NA 140 12/25/2019   K 4.8 12/25/2019   CL 104 12/25/2019   CO2 29 12/25/2019   Lab Results  Component Value Date   ALT 14 12/25/2019   AST 15 12/25/2019   ALKPHOS 102 12/25/2019   BILITOT 0.4 12/25/2019  Lab Results  Component Value Date   HGBA1C 4.8 10/21/2019   HGBA1C 4.9 12/10/2018   Lab Results  Component Value Date   INSULIN 16.6 12/10/2018   Lab Results  Component Value Date   TSH 3.59 12/25/2019   Lab Results  Component Value Date   CHOL 215 (H) 10/21/2019   HDL 54 10/21/2019    LDLCALC 140 (H) 10/21/2019   TRIG 115 10/21/2019   CHOLHDL 4.0 10/21/2019   Lab Results  Component Value Date   WBC 5.7 10/21/2019   HGB 12.6 10/21/2019   HCT 38.8 10/21/2019   MCV 87 10/21/2019   PLT 244 10/21/2019   No results found for: IRON, TIBC, FERRITIN   Ref. Range 12/10/2018 11:51  Vitamin D, 25-Hydroxy Latest Ref Range: 30.0 - 100.0 ng/mL 43.1   Attestation Statements:   Reviewed by clinician on day of visit: allergies, medications, problem list, medical history, surgical history, family history, social history, and previous encounter notes.  Corey Skains, am acting as Location manager for [employer], FNP-C.  I have reviewed the above documentation for accuracy and completeness, and I agree with the above. - Janiece Scovill [employer], FNP-C

## 2020-02-11 ENCOUNTER — Ambulatory Visit (INDEPENDENT_AMBULATORY_CARE_PROVIDER_SITE_OTHER): Payer: BC Managed Care – PPO | Admitting: Family Medicine

## 2020-02-17 ENCOUNTER — Encounter (INDEPENDENT_AMBULATORY_CARE_PROVIDER_SITE_OTHER): Payer: Self-pay | Admitting: Family Medicine

## 2020-02-17 ENCOUNTER — Other Ambulatory Visit: Payer: Self-pay

## 2020-02-17 ENCOUNTER — Ambulatory Visit (INDEPENDENT_AMBULATORY_CARE_PROVIDER_SITE_OTHER): Payer: BC Managed Care – PPO | Admitting: Family Medicine

## 2020-02-17 VITALS — BP 123/76 | HR 69 | Temp 98.3°F | Ht 66.0 in | Wt 194.0 lb

## 2020-02-17 DIAGNOSIS — E8881 Metabolic syndrome: Secondary | ICD-10-CM

## 2020-02-17 DIAGNOSIS — E669 Obesity, unspecified: Secondary | ICD-10-CM

## 2020-02-17 DIAGNOSIS — Z6831 Body mass index (BMI) 31.0-31.9, adult: Secondary | ICD-10-CM

## 2020-02-18 NOTE — Progress Notes (Signed)
Chief Complaint:   OBESITY Alisha Chen is here to discuss her progress with her obesity treatment plan along with follow-up of her obesity related diagnoses. Alisha Chen is on keeping a food journal and adhering to recommended goals of 1150-1250 calories and 80 grams of protein daily and states she is following her eating plan approximately 60-70% of the time. Alisha Chen states she is on the elliptical and walking for 60-120 minutes 6-7 times per week.  Today's visit was #: 35 Starting weight: 201 lbs Starting date: 12/10/2018 Today's weight: 194 lbs Today's date: 02/17/2020 Total lbs lost to date: 7 Total lbs lost since last in-office visit: 1  Interim History: Alisha Chen stopped Saxenda one week ago due to soreness at injection site. She would rather do without it if she can and she does not feel her appetite has been excessive without it.. She is exercising with a group of friends at 6 am through [employer] and there are holding each other accountable about food intake and exercise. She is journaling daily.  Subjective:   1. Insulin resistance Alisha Chen reports occasional polyphagia. She is not on metformin.  Assessment/Plan:   1. Insulin resistance Alisha Chen will continue her meal plan, and will continue to work on weight loss, exercise, and decreasing simple carbohydrates to help decrease the risk of diabetes. Alisha Chen agreed to follow-up with [place] as directed to closely monitor her progress.  2. Class 1 obesity with serious comorbidity and body mass index (BMI) of 31.0 to 31.9 in adult, unspecified obesity type Alisha Chen is currently in the action stage of change. As such, her goal is to continue with weight loss efforts. She has agreed to keeping a food journal and adhering to recommended goals of 1150-1250 calories and 80 grams of protein daily.  We discussed various medication options to help Alisha Chen with her weight loss efforts and we both agreed to hold off on Saxenda for now.  Exercise goals: As is.  Behavioral  modification strategies: increasing lean protein intake, decreasing simple carbohydrates and keeping a strict food journal.  Alisha Chen has agreed to follow-up with our clinic in 4 weeks. She was informed of the importance of frequent follow-up visits to maximize her success with intensive lifestyle modifications for her multiple health conditions.   Objective:   Blood pressure 123/76, pulse 69, temperature 98.3 F (36.8 C), temperature source Oral, height 5\' 6"  (1.676 m), weight 194 lb (88 kg), last menstrual period 02/27/2012, SpO2 98 %. Body mass index is 31.31 kg/m.  General: Cooperative, alert, well developed, in no acute distress. HEENT: Conjunctivae and lids unremarkable. Cardiovascular: Regular rhythm.  Lungs: Normal work of breathing. Neurologic: No focal deficits.   Lab Results  Component Value Date   CREATININE 0.92 12/25/2019   BUN 16 12/25/2019   NA 140 12/25/2019   K 4.8 12/25/2019   CL 104 12/25/2019   CO2 29 12/25/2019   Lab Results  Component Value Date   ALT 14 12/25/2019   AST 15 12/25/2019   ALKPHOS 102 12/25/2019   BILITOT 0.4 12/25/2019   Lab Results  Component Value Date   HGBA1C 4.8 10/21/2019   HGBA1C 4.9 12/10/2018   Lab Results  Component Value Date   INSULIN 16.6 12/10/2018   Lab Results  Component Value Date   TSH 3.59 12/25/2019   Lab Results  Component Value Date   CHOL 215 (H) 10/21/2019   HDL 54 10/21/2019   LDLCALC 140 (H) 10/21/2019   TRIG 115 10/21/2019   CHOLHDL 4.0 10/21/2019  Lab Results  Component Value Date   WBC 5.7 10/21/2019   HGB 12.6 10/21/2019   HCT 38.8 10/21/2019   MCV 87 10/21/2019   PLT 244 10/21/2019   No results found for: IRON, TIBC, FERRITIN  Attestation Statements:   Reviewed by clinician on day of visit: allergies, medications, problem list, medical history, surgical history, family history, social history, and previous encounter notes.   Wilhemena Durie, am acting as Location manager for [employer], FNP-C.  I have reviewed the above documentation for accuracy and completeness, and I agree with the above. -  Georgianne Fick, FNP

## 2020-02-19 ENCOUNTER — Encounter: Payer: Self-pay | Admitting: Family Medicine

## 2020-03-01 ENCOUNTER — Encounter: Payer: Self-pay | Admitting: Family Medicine

## 2020-03-03 ENCOUNTER — Encounter: Payer: Self-pay | Admitting: Family Medicine

## 2020-03-03 ENCOUNTER — Ambulatory Visit (INDEPENDENT_AMBULATORY_CARE_PROVIDER_SITE_OTHER): Payer: BC Managed Care – PPO | Admitting: Family Medicine

## 2020-03-03 ENCOUNTER — Other Ambulatory Visit: Payer: Self-pay

## 2020-03-03 VITALS — BP 112/82 | HR 78 | Temp 97.3°F | Resp 18 | Ht 66.0 in | Wt 200.4 lb

## 2020-03-03 DIAGNOSIS — K649 Unspecified hemorrhoids: Secondary | ICD-10-CM | POA: Diagnosis not present

## 2020-03-03 MED ORDER — HYDROCORTISONE ACETATE 25 MG RE SUPP: 25.0000 mg | Freq: Two times a day (BID) | RECTAL | 0 refills | Status: DC

## 2020-03-03 MED ORDER — HYDROCORT-PRAMOXINE (PERIANAL) 1-1 % EX FOAM: 1.0000 | Freq: Two times a day (BID) | CUTANEOUS | 1 refills | Status: DC

## 2020-03-03 NOTE — Patient Instructions (Signed)

## 2020-03-03 NOTE — Progress Notes (Signed)
Patient ID: Alisha Chen, female    DOB: [birth year]  Age: 54 y.o. MRN: [id number]    Subjective:  Subjective  HPI Alisha Chen presents for lump in rectum--- she is using prep H with some relief.  No bleeding   Review of Systems  Constitutional: Negative for appetite change, diaphoresis, fatigue and unexpected weight change.  Eyes: Negative for pain, redness and visual disturbance.  Respiratory: Negative for cough, chest tightness, shortness of breath and wheezing.   Cardiovascular: Negative for chest pain, palpitations and leg swelling.  Gastrointestinal: Positive for rectal pain.  Endocrine: Negative for cold intolerance, heat intolerance, polydipsia, polyphagia and polyuria.  Genitourinary: Negative for difficulty urinating, dysuria and frequency.  Neurological: Negative for dizziness, light-headedness, numbness and headaches.    History Past Medical History:  Diagnosis Date  . Anemia   . Back pain   . 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 History of chemotherapy 03/23/12- 05/10/12   s/p 4 cycles of FEC   . Hx: UTI (urinary tract infection)   . Knee pain   . Lactose intolerance   . Neck pain   . S/P radiation therapy 07/10/12-08/23/12   Left Breast: 50 Gy/25 Fractions; Boost: 10 Gy/5 Fractions  . Status post chemotherapy 1 dose 05/23/12   Stopped after one cycle of Taxol due to Grade 2 Toxicity  . Status post chemotherapy 1 dose 05/23/12   Stopped after one cycle of Taxol due to Grade 2 Toxicity    She has a past surgical history that includes Spine surgery (01/2011); Foot surgery; Back surgery; Portacath placement (02/10/2012); needle core Biospy (01/09/12); Regional Resection (03/06/12); Intrauterine device removal (01/24/12); Axillary Surgery (03/06/12); Breast lumpectomy (Left, 02/10/12); and Breast biopsy (01/09/2012).   Her family history includes Diabetes in her mother; Obesity in her mother; Schizophrenia in her mother; Seizures  in her son; Sleep apnea in her mother; Sudden death in her mother.She reports that she has never smoked. She has never used smokeless tobacco. She reports that she does not drink alcohol or use drugs.  Current Outpatient Medications on File Prior to Visit  Medication Sig Dispense Refill  . cholecalciferol (VITAMIN D) 1000 UNITS tablet Take 1,000 Units by mouth daily.    . Insulin Pen Needle (BD PEN NEEDLE NANO 2ND GEN) 32G X 4 MM MISC Use 1 needle daily to inject Saxenda. 100 each 0  . levothyroxine (SYNTHROID) 50 MCG tablet Take 1 tablet (50 mcg total) by mouth daily before breakfast. 30 tablet 5  . Liraglutide -Weight Management (SAXENDA) 18 MG/3ML SOPN Inject 3 mg into the skin daily. 5 pen 0   No current facility-administered medications on file prior to visit.     Objective:  Objective  Physical Exam Vitals and nursing note reviewed.  Genitourinary:    Rectum: Guaiac result negative. Tenderness, external hemorrhoid and internal hemorrhoid present. No anal fissure.    BP 112/82 (BP Location: Right Arm, Patient Position: Sitting, Cuff Size: Large)   Pulse 78   Temp (!) 97.3 F (36.3 C) (Temporal)   Resp 18   Ht 5\' 6"  (1.676 m)   Wt 200 lb 6.4 oz (90.9 kg)   LMP 02/27/2012   SpO2 98%   BMI 32.35 kg/m  Wt Readings from Last 3 Encounters:  03/03/20 200 lb 6.4 oz (90.9 kg)  02/17/20 194 lb (88 kg)  11/11/19 198 lb 6.4 oz (90 kg)     Lab Results  Component Value Date  WBC 5.7 10/21/2019   HGB 12.6 10/21/2019   HCT 38.8 10/21/2019   PLT 244 10/21/2019   GLUCOSE 72 12/25/2019   CHOL 215 (H) 10/21/2019   TRIG 115 10/21/2019   HDL 54 10/21/2019   LDLCALC 140 (H) 10/21/2019   ALT 14 12/25/2019   AST 15 12/25/2019   NA 140 12/25/2019   K 4.8 12/25/2019   CL 104 12/25/2019   CREATININE 0.92 12/25/2019   BUN 16 12/25/2019   CO2 29 12/25/2019   TSH 3.59 12/25/2019   INR 1.04 02/10/2011   HGBA1C 4.8 10/21/2019    MM 3D SCREEN BREAST BILATERAL  Result Date:  11/13/2019 CLINICAL DATA:  Screening. EXAM: DIGITAL SCREENING BILATERAL MAMMOGRAM WITH TOMO AND CAD COMPARISON:  Previous exam(s). ACR Breast Density Category b: There are scattered areas of fibroglandular density. FINDINGS: There are no findings suspicious for malignancy. Images were processed with CAD. IMPRESSION: No mammographic evidence of malignancy. A result letter of this screening mammogram will be mailed directly to the patient. RECOMMENDATION: Screening mammogram in one year. (Code:SM-B-01Y) BI-RADS CATEGORY  1: Negative. Electronically Signed   By: Kristopher Oppenheim M.D.   On: 11/13/2019 10:41     Assessment & Plan:  Plan  I am having Alisha Levell. G[place] start on hydrocortisone and hydrocortisone-pramoxine. I am also having her maintain her cholecalciferol, Saxenda, BD Pen Needle Nano 2nd Gen, and levothyroxine.  Meds ordered this encounter  Medications  . hydrocortisone (ANUSOL-HC) 25 MG suppository    Sig: Place 1 suppository (25 mg total) rectally 2 (two) times daily.    Dispense:  12 suppository    Refill:  0  . hydrocortisone-pramoxine (PROCTOFOAM-HC) rectal foam    Sig: Place 1 applicator rectally 2 (two) times daily.    Dispense:  10 g    Refill:  1    Problem List Items Addressed This Visit    None    Visit Diagnoses    Hemorrhoids, unspecified hemorrhoid type    -  Primary   Relevant Medications   hydrocortisone (ANUSOL-HC) 25 MG suppository   hydrocortisone-pramoxine (PROCTOFOAM-HC) rectal foam    sitz bath Refer to gi prn Follow-up: Return if symptoms worsen or fail to improve.  [name], DO

## 2020-03-17 ENCOUNTER — Encounter (INDEPENDENT_AMBULATORY_CARE_PROVIDER_SITE_OTHER): Payer: Self-pay | Admitting: Family Medicine

## 2020-03-17 ENCOUNTER — Ambulatory Visit (INDEPENDENT_AMBULATORY_CARE_PROVIDER_SITE_OTHER): Payer: BC Managed Care – PPO | Admitting: Family Medicine

## 2020-03-17 ENCOUNTER — Other Ambulatory Visit: Payer: Self-pay

## 2020-03-17 VITALS — BP 118/73 | HR 75 | Temp 98.8°F | Ht 66.0 in | Wt 197.0 lb

## 2020-03-17 DIAGNOSIS — R600 Localized edema: Secondary | ICD-10-CM

## 2020-03-17 DIAGNOSIS — R609 Edema, unspecified: Secondary | ICD-10-CM

## 2020-03-17 DIAGNOSIS — E669 Obesity, unspecified: Secondary | ICD-10-CM

## 2020-03-17 DIAGNOSIS — Z6831 Body mass index (BMI) 31.0-31.9, adult: Secondary | ICD-10-CM | POA: Diagnosis not present

## 2020-03-17 DIAGNOSIS — E66811 Obesity, class 1: Secondary | ICD-10-CM

## 2020-03-18 NOTE — Progress Notes (Signed)
Chief Complaint:   OBESITY Alisha Chen is here to discuss her progress with her obesity treatment plan along with follow-up of her obesity related diagnoses. Alisha Chen is on keeping a food journal and adhering to recommended goals of 1150-1250 calories and 80 grams of protein daily and states she is following her eating plan approximately 75% of the time. Alisha Chen states she is walking and on the elliptical for 60 minutes 3 times per week.  Today's visit was #: 42 Starting weight: 201 lbs Starting date: 12/10/2018 Today's weight: 197 lbs Today's date: 03/17/2020 Total lbs lost to date: 4 Total lbs lost since last in-office visit: 0  Interim History: Alisha Chen is journaling daily, and she is meeting her calorie and protein goals. She does have extra water weight per the bioimpedance scale. She notes her hunger is well satisfied.  Subjective:   1. Peripheral edema Alisha Chen notes swelling in her hands and left ankle today. This is not chronic, and our bioimpedance scale shows almost 8 lb increase in water weight.  Assessment/Plan:   1. Peripheral edema We will continue to monitor.  2. Class 1 obesity with serious comorbidity and body mass index (BMI) of 31.0 to 31.9 in adult, unspecified obesity type Alisha Chen is currently in the action stage of change. As such, her goal is to continue with weight loss efforts. She has agreed to keeping a food journal and adhering to recommended goals of 1150-1250 calories and 80 grams of protein daily.   Exercise goals: As is.  Behavioral modification strategies: increasing water intake and keeping a strict food journal.  Alisha Chen has agreed to follow-up with our clinic in 2 weeks. She was informed of the importance of frequent follow-up visits to maximize her success with intensive lifestyle modifications for her multiple health conditions.   Objective:   Blood pressure 118/73, pulse 75, temperature 98.8 F (37.1 C), temperature source Oral, height 5\' 6"  (1.676 m),  weight 197 lb (89.4 kg), last menstrual period 02/27/2012, SpO2 98 %. Body mass index is 31.8 kg/m.  General: Cooperative, alert, well developed, in no acute distress. HEENT: Conjunctivae and lids unremarkable. Cardiovascular: Regular rhythm.  Lungs: Normal work of breathing. Neurologic: No focal deficits.   Lab Results  Component Value Date   CREATININE 0.92 12/25/2019   BUN 16 12/25/2019   NA 140 12/25/2019   K 4.8 12/25/2019   CL 104 12/25/2019   CO2 29 12/25/2019   Lab Results  Component Value Date   ALT 14 12/25/2019   AST 15 12/25/2019   ALKPHOS 102 12/25/2019   BILITOT 0.4 12/25/2019   Lab Results  Component Value Date   HGBA1C 4.8 10/21/2019   HGBA1C 4.9 12/10/2018   Lab Results  Component Value Date   INSULIN 16.6 12/10/2018   Lab Results  Component Value Date   TSH 3.59 12/25/2019   Lab Results  Component Value Date   CHOL 215 (H) 10/21/2019   HDL 54 10/21/2019   LDLCALC 140 (H) 10/21/2019   TRIG 115 10/21/2019   CHOLHDL 4.0 10/21/2019   Lab Results  Component Value Date   WBC 5.7 10/21/2019   HGB 12.6 10/21/2019   HCT 38.8 10/21/2019   MCV 87 10/21/2019   PLT 244 10/21/2019   No results found for: IRON, TIBC, FERRITIN  Attestation Statements:   Reviewed by clinician on day of visit: allergies, medications, problem list, medical history, surgical history, family history, social history, and previous encounter notes.   Wilhemena Durie, am acting  as Location manager for [employer], FNP-C.  I have reviewed the above documentation for accuracy and completeness, and I agree with the above. -  Georgianne Fick, FNP

## 2020-03-19 ENCOUNTER — Encounter (INDEPENDENT_AMBULATORY_CARE_PROVIDER_SITE_OTHER): Payer: Self-pay | Admitting: Family Medicine

## 2020-03-25 ENCOUNTER — Encounter: Payer: Self-pay | Admitting: Family Medicine

## 2020-04-01 ENCOUNTER — Other Ambulatory Visit: Payer: Self-pay

## 2020-04-01 ENCOUNTER — Ambulatory Visit (INDEPENDENT_AMBULATORY_CARE_PROVIDER_SITE_OTHER): Payer: BC Managed Care – PPO | Admitting: Family Medicine

## 2020-04-01 VITALS — BP 114/75 | HR 69 | Temp 98.4°F | Ht 66.0 in | Wt 194.0 lb

## 2020-04-01 DIAGNOSIS — E8881 Metabolic syndrome: Secondary | ICD-10-CM

## 2020-04-01 DIAGNOSIS — Z6831 Body mass index (BMI) 31.0-31.9, adult: Secondary | ICD-10-CM | POA: Diagnosis not present

## 2020-04-01 DIAGNOSIS — E669 Obesity, unspecified: Secondary | ICD-10-CM | POA: Diagnosis not present

## 2020-04-01 NOTE — Progress Notes (Signed)
Chief Complaint:   OBESITY Alisha Chen is here to discuss her progress with her obesity treatment plan along with follow-up of her obesity related diagnoses. Alisha Chen is on keeping a food journal and adhering to recommended goals of 1150-1250 calories and 80 grams of protein daily and states she is following her eating plan approximately 70% of the time. Alisha Chen states she is on the elliptical for 30-60 minutes 1-3 times per week.  Today's visit was #: 28 Starting weight: 201 lbs Starting date: 12/10/2018 Today's weight: 194 lbs Today's date: 04/01/2020 Total lbs lost to date: 7 Total lbs lost since last in-office visit: 3  Interim History: Alisha Chen is journaling consistently and meets her protein goals but sometimes goes over on calories. Today she has had yogurt for breakfast and chicken salad for lunch. She notes she feels summer will make it easier to stick to the plan. She does virtual meetings with a group of friends and they support healthy habits for each other which she notes is quite helpful.. She struggles with consistency with the meal plan.   Subjective:   1. Insulin resistance Alisha Chen denies excessive hunger. She is not on metformin. She had previously been on Saxenda and feels she is doing fine without it. It caused some constipation.  Assessment/Plan:   1. Insulin resistance Alisha Chen will continue her meal plan, and will continue to work on weight loss, exercise, and decreasing simple carbohydrates to help decrease the risk of diabetes. Alisha Chen agreed to follow-up with [place] as directed to closely monitor her progress.  2. Class 1 obesity with serious comorbidity and body mass index (BMI) of 31.0 to 31.9 in adult, unspecified obesity type Alisha Chen is currently in the action stage of change. As such, her goal is to continue with weight loss efforts. She has agreed to keeping a food journal and adhering to recommended goals of 1150-1250 calories and 80 gram of protein daily.   Exercise goals: As  is.  Behavioral modification strategies: increasing lean protein intake, decreasing simple carbohydrates and no skipping meals.  Alisha Chen has agreed to follow-up with our clinic in 4 weeks. She was informed of the importance of frequent follow-up visits to maximize her success with intensive lifestyle modifications for her multiple health conditions.   Objective:   Blood pressure 114/75, pulse 69, temperature 98.4 F (36.9 C), temperature source Oral, height 5\' 6"  (1.676 m), weight 194 lb (88 kg), last menstrual period 02/27/2012, SpO2 100 %. Body mass index is 31.31 kg/m.  General: Cooperative, alert, well developed, in no acute distress. HEENT: Conjunctivae and lids unremarkable. Cardiovascular: Regular rhythm.  Lungs: Normal work of breathing. Neurologic: No focal deficits.   Lab Results  Component Value Date   CREATININE 0.92 12/25/2019   BUN 16 12/25/2019   NA 140 12/25/2019   K 4.8 12/25/2019   CL 104 12/25/2019   CO2 29 12/25/2019   Lab Results  Component Value Date   ALT 14 12/25/2019   AST 15 12/25/2019   ALKPHOS 102 12/25/2019   BILITOT 0.4 12/25/2019   Lab Results  Component Value Date   HGBA1C 4.8 10/21/2019   HGBA1C 4.9 12/10/2018   Lab Results  Component Value Date   INSULIN 16.6 12/10/2018   Lab Results  Component Value Date   TSH 3.59 12/25/2019   Lab Results  Component Value Date   CHOL 215 (H) 10/21/2019   HDL 54 10/21/2019   LDLCALC 140 (H) 10/21/2019   TRIG 115 10/21/2019   CHOLHDL 4.0  10/21/2019   Lab Results  Component Value Date   WBC 5.7 10/21/2019   HGB 12.6 10/21/2019   HCT 38.8 10/21/2019   MCV 87 10/21/2019   PLT 244 10/21/2019   No results found for: IRON, TIBC, FERRITIN  Attestation Statements:   Reviewed by clinician on day of visit: allergies, medications, problem list, medical history, surgical history, family history, social history, and previous encounter notes.   Wilhemena Durie, am acting as Location manager for  [employer], FNP-C.  I have reviewed the above documentation for accuracy and completeness, and I agree with the above. -  Georgianne Fick, FNP

## 2020-04-02 ENCOUNTER — Encounter (INDEPENDENT_AMBULATORY_CARE_PROVIDER_SITE_OTHER): Payer: Self-pay | Admitting: Family Medicine

## 2020-04-24 ENCOUNTER — Encounter (INDEPENDENT_AMBULATORY_CARE_PROVIDER_SITE_OTHER): Payer: Self-pay | Admitting: Family Medicine

## 2020-04-27 NOTE — Telephone Encounter (Signed)
Please r/s

## 2020-04-29 ENCOUNTER — Ambulatory Visit (INDEPENDENT_AMBULATORY_CARE_PROVIDER_SITE_OTHER): Payer: BC Managed Care – PPO | Admitting: Family Medicine

## 2020-05-05 ENCOUNTER — Encounter (INDEPENDENT_AMBULATORY_CARE_PROVIDER_SITE_OTHER): Payer: Self-pay | Admitting: Family Medicine

## 2020-05-05 ENCOUNTER — Ambulatory Visit (INDEPENDENT_AMBULATORY_CARE_PROVIDER_SITE_OTHER): Payer: BC Managed Care – PPO | Admitting: Family Medicine

## 2020-05-05 ENCOUNTER — Other Ambulatory Visit: Payer: Self-pay

## 2020-05-05 VITALS — BP 121/78 | HR 71 | Temp 98.4°F | Ht 66.0 in | Wt 194.0 lb

## 2020-05-05 DIAGNOSIS — E8881 Metabolic syndrome: Secondary | ICD-10-CM | POA: Diagnosis not present

## 2020-05-05 DIAGNOSIS — Z6831 Body mass index (BMI) 31.0-31.9, adult: Secondary | ICD-10-CM | POA: Diagnosis not present

## 2020-05-05 DIAGNOSIS — E669 Obesity, unspecified: Secondary | ICD-10-CM | POA: Diagnosis not present

## 2020-05-05 NOTE — Progress Notes (Signed)
Chief Complaint:   OBESITY Alisha Chen is here to discuss her progress with her obesity treatment plan along with follow-up of her obesity related diagnoses. Alisha Chen is on keeping a food journal and adhering to recommended goals of 1150-1250 calories and 80 grams of protein daily and states she is following her eating plan approximately 60-70% of the time. Alisha Chen states she is walking and on the elliptical for 30-60 minutes 1-3 times per week.  Today's visit was #: 41 Starting weight: 201 lbs Starting date: 12/10/2018 Today's weight: 194 lbs Today's date: 05/05/2020 Total lbs lost to date: 7 Total lbs lost since last in-office visit: 0  Interim History: Alisha Chen is journaling daily. She tends to go over on her calories at times but does meet protein goal. She notes she is still busy despite being out of school for the summer and has not had a lot of time to devote to meal planning. 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  Subjective:   1. Insulin resistance Alisha Chen has a diagnosis of insulin resistance based on her elevated fasting insulin level >5. She denies polyphagia. She continues to work on diet and exercise to decrease her risk of diabetes.  Lab Results  Component Value Date   INSULIN 16.6 12/10/2018   Lab Results  Component Value Date   HGBA1C 4.8 10/21/2019   Assessment/Plan:   1. Insulin resistance Alisha Chen will continue her meal plan, and will continue to work on weight loss, exercise, and decreasing simple carbohydrates to help decrease the risk of diabetes. Alisha Chen agreed to follow-up with [place] as directed to closely monitor her progress.  2. Class 1 obesity with serious comorbidity and body mass index (BMI) of 31.0 to 31.9 in adult, unspecified obesity type Alisha Chen is currently in the action stage of change. As such, her goal is to continue with weight loss efforts. She has agreed to keeping a food journal and adhering to recommended goals of 1150-1250 calories and 80 grams of protein  daily.   Alisha Chen will weigh herself weekly rather than daily.  Exercise goals: As is.  Behavioral modification strategies: increasing lean protein intake, decreasing simple carbohydrates and better snacking choices.  Alisha Chen has agreed to follow-up with our clinic in 3 weeks. She was informed of the importance of frequent follow-up visits to maximize her success with intensive lifestyle modifications for her multiple health conditions.   Objective:   Blood pressure 121/78, pulse 71, temperature 98.4 F (36.9 C), temperature source Oral, height 5\' 6"  (1.676 m), weight 194 lb (88 kg), last menstrual period 02/27/2012, SpO2 98 %. Body mass index is 31.31 kg/m.  General: Cooperative, alert, well developed, in no acute distress. HEENT: Conjunctivae and lids unremarkable. Cardiovascular: Regular rhythm.  Lungs: Normal work of breathing. Neurologic: No focal deficits.   Lab Results  Component Value Date   CREATININE 0.92 12/25/2019   BUN 16 12/25/2019   NA 140 12/25/2019   K 4.8 12/25/2019   CL 104 12/25/2019   CO2 29 12/25/2019   Lab Results  Component Value Date   ALT 14 12/25/2019   AST 15 12/25/2019   ALKPHOS 102 12/25/2019   BILITOT 0.4 12/25/2019   Lab Results  Component Value Date   HGBA1C 4.8 10/21/2019   HGBA1C 4.9 12/10/2018   Lab Results  Component Value Date   INSULIN 16.6 12/10/2018   Lab Results  Component Value Date   TSH 3.59 12/25/2019   Lab Results  Component Value Date   CHOL 215 (  H) 10/21/2019   HDL 54 10/21/2019   LDLCALC 140 (H) 10/21/2019   TRIG 115 10/21/2019   CHOLHDL 4.0 10/21/2019   Lab Results  Component Value Date   WBC 5.7 10/21/2019   HGB 12.6 10/21/2019   HCT 38.8 10/21/2019   MCV 87 10/21/2019   PLT 244 10/21/2019   No results found for: IRON, TIBC, FERRITIN  Attestation Statements:   Reviewed by clinician on day of visit: allergies, medications, problem list, medical history, surgical history, family history, social  history, and previous encounter notes.   Wilhemena Durie, am acting as Location manager for [employer], FNP-C.  I have reviewed the above documentation for accuracy and completeness, and I agree with the above. -  Georgianne Fick, FNP

## 2020-05-06 ENCOUNTER — Encounter (INDEPENDENT_AMBULATORY_CARE_PROVIDER_SITE_OTHER): Payer: Self-pay | Admitting: Family Medicine

## 2020-05-10 ENCOUNTER — Encounter (INDEPENDENT_AMBULATORY_CARE_PROVIDER_SITE_OTHER): Payer: Self-pay | Admitting: Family Medicine

## 2020-05-11 NOTE — Telephone Encounter (Signed)
Please advise 

## 2020-05-27 ENCOUNTER — Other Ambulatory Visit: Payer: Self-pay

## 2020-05-27 ENCOUNTER — Ambulatory Visit (INDEPENDENT_AMBULATORY_CARE_PROVIDER_SITE_OTHER): Payer: BC Managed Care – PPO | Admitting: Family Medicine

## 2020-05-27 ENCOUNTER — Encounter (INDEPENDENT_AMBULATORY_CARE_PROVIDER_SITE_OTHER): Payer: Self-pay | Admitting: Family Medicine

## 2020-05-27 VITALS — BP 105/70 | HR 77 | Temp 98.3°F | Ht 66.0 in | Wt 194.0 lb

## 2020-05-27 DIAGNOSIS — E559 Vitamin D deficiency, unspecified: Secondary | ICD-10-CM

## 2020-05-27 DIAGNOSIS — E038 Other specified hypothyroidism: Secondary | ICD-10-CM | POA: Diagnosis not present

## 2020-05-27 DIAGNOSIS — E8881 Metabolic syndrome: Secondary | ICD-10-CM

## 2020-05-27 DIAGNOSIS — E669 Obesity, unspecified: Secondary | ICD-10-CM

## 2020-05-27 DIAGNOSIS — E7849 Other hyperlipidemia: Secondary | ICD-10-CM

## 2020-05-27 DIAGNOSIS — Z6831 Body mass index (BMI) 31.0-31.9, adult: Secondary | ICD-10-CM

## 2020-05-27 DIAGNOSIS — F3289 Other specified depressive episodes: Secondary | ICD-10-CM

## 2020-05-27 DIAGNOSIS — R5383 Other fatigue: Secondary | ICD-10-CM | POA: Diagnosis not present

## 2020-05-27 DIAGNOSIS — E88819 Insulin resistance, unspecified: Secondary | ICD-10-CM

## 2020-05-28 LAB — CBC WITH DIFFERENTIAL/PLATELET
Basophils Absolute: 0 10*3/uL (ref 0.0–0.2)
Basos: 1 %
EOS (ABSOLUTE): 0.1 10*3/uL (ref 0.0–0.4)
Eos: 3 %
Hematocrit: 38.8 % (ref 34.0–46.6)
Hemoglobin: 12.2 g/dL (ref 11.1–15.9)
Immature Grans (Abs): 0 10*3/uL (ref 0.0–0.1)
Immature Granulocytes: 0 %
Lymphocytes Absolute: 1.2 10*3/uL (ref 0.7–3.1)
Lymphs: 29 %
MCH: 28.1 pg (ref 26.6–33.0)
MCHC: 31.4 g/dL — ABNORMAL LOW (ref 31.5–35.7)
MCV: 89 fL (ref 79–97)
Monocytes Absolute: 0.2 10*3/uL (ref 0.1–0.9)
Monocytes: 5 %
Neutrophils Absolute: 2.6 10*3/uL (ref 1.4–7.0)
Neutrophils: 62 %
Platelets: 227 10*3/uL (ref 150–450)
RBC: 4.34 x10E6/uL (ref 3.77–5.28)
RDW: 12.4 % (ref 11.7–15.4)
WBC: 4.3 10*3/uL (ref 3.4–10.8)

## 2020-05-28 LAB — LIPID PANEL WITH LDL/HDL RATIO
Cholesterol, Total: 183 mg/dL (ref 100–199)
HDL: 54 mg/dL (ref >39)
LDL Chol Calc (NIH): 118 mg/dL — ABNORMAL HIGH (ref 0–99)
LDL/HDL Ratio: 2.2 ratio (ref 0.0–3.2)
Triglycerides: 60 mg/dL (ref 0–149)
VLDL Cholesterol Cal: 11 mg/dL (ref 5–40)

## 2020-05-28 LAB — COMPREHENSIVE METABOLIC PANEL
ALT: 17 IU/L (ref 0–32)
AST: 17 IU/L (ref 0–40)
Albumin/Globulin Ratio: 1.5 (ref 1.2–2.2)
Albumin: 4.3 g/dL (ref 3.8–4.9)
Alkaline Phosphatase: 102 IU/L (ref 48–121)
BUN/Creatinine Ratio: 25 — ABNORMAL HIGH (ref 9–23)
BUN: 20 mg/dL (ref 6–24)
Bilirubin Total: 0.3 mg/dL (ref 0.0–1.2)
CO2: 27 mmol/L (ref 20–29)
Calcium: 10.5 mg/dL — ABNORMAL HIGH (ref 8.7–10.2)
Chloride: 104 mmol/L (ref 96–106)
Creatinine, Ser: 0.79 mg/dL (ref 0.57–1.00)
GFR calc Af Amer: 97 mL/min/{1.73_m2} (ref >59)
GFR calc non Af Amer: 85 mL/min/{1.73_m2} (ref >59)
Globulin, Total: 2.8 g/dL (ref 1.5–4.5)
Glucose: 77 mg/dL (ref 65–99)
Potassium: 4.7 mmol/L (ref 3.5–5.2)
Sodium: 140 mmol/L (ref 134–144)
Total Protein: 7.1 g/dL (ref 6.0–8.5)

## 2020-05-28 LAB — TSH: TSH: 4.02 u[IU]/mL (ref 0.450–4.500)

## 2020-05-28 LAB — T4, FREE: Free T4: 1.34 ng/dL (ref 0.82–1.77)

## 2020-05-28 LAB — T3: T3, Total: 113 ng/dL (ref 71–180)

## 2020-05-28 LAB — HEMOGLOBIN A1C
Est. average glucose Bld gHb Est-mCnc: 97 mg/dL
Hgb A1c MFr Bld: 5 % (ref 4.8–5.6)

## 2020-05-28 LAB — VITAMIN D 25 HYDROXY (VIT D DEFICIENCY, FRACTURES): Vit D, 25-Hydroxy: 54.1 ng/mL (ref 30.0–100.0)

## 2020-05-28 LAB — INSULIN, RANDOM: INSULIN: 13.5 u[IU]/mL (ref 2.6–24.9)

## 2020-06-01 ENCOUNTER — Encounter (INDEPENDENT_AMBULATORY_CARE_PROVIDER_SITE_OTHER): Payer: Self-pay | Admitting: Family Medicine

## 2020-06-01 DIAGNOSIS — E039 Hypothyroidism, unspecified: Secondary | ICD-10-CM | POA: Insufficient documentation

## 2020-06-01 DIAGNOSIS — E7849 Other hyperlipidemia: Secondary | ICD-10-CM | POA: Insufficient documentation

## 2020-06-01 NOTE — Progress Notes (Signed)
Chief Complaint:   OBESITY Alisha Chen is here to discuss her progress with her obesity treatment plan along with follow-up of her obesity related diagnoses. Alisha Chen is on keeping a food journal and adhering to recommended goals of 1150-1250 calories and 80 grams of protein daily and states she is following her eating plan approximately 30% of the time. Alisha Chen states she is walking for 30-60 minutes 1 time per week.  Today's visit was #: 75 Starting weight: 201 lbs Starting date: 12/10/2018 Today's weight: 194 lbs Today's date: 05/27/2020 Total lbs lost to date: 7 Total lbs lost since last in-office visit: 0  Interim History: She has maintained her wt today.Alisha Chen is journaling daily. She does not eat after 6 pm. She tends to go over on her calories, especially if she eats after 6 pm. She is doing very well with meeting protein goals. She sometimes eats as many as 1700-1800 calories daily. She notes cravings at night.  Subjective:   1. Other fatigue Alisha Chen notes fatigue over the past 3 months. She does not always get in 7 hours of sleep per night. She notes snoring. She feels rested after sleep. She denies depression.  2. Vitamin D deficiency Alisha Chen is on OTC Vit D 1,000 IU daily. Last Vit D level was 43.1.  3. Other specified hypothyroidism Alisha Chen is on levothyroxine 50 mcg daily and she notes fatigue.   Lab Results  Component Value Date   TSH 4.020 05/27/2020   4. Other hyperlipidemia Alisha Chen has hyperlipidemia and has been trying to improve her cholesterol levels with intensive lifestyle modification including a low saturated fat diet, exercise and weight loss. Last LDL was high at 140, HDL and triglycerides within normal limits. She is not on statin. She denies any chest pain, claudication or myalgias.  Lab Results  Component Value Date   ALT 17 05/27/2020   AST 17 05/27/2020   ALKPHOS 102 05/27/2020   BILITOT 0.3 05/27/2020   Lab Results  Component Value Date   CHOL 183  05/27/2020   HDL 54 05/27/2020   LDLCALC 118 (H) 05/27/2020   TRIG 60 05/27/2020   CHOLHDL 4.0 10/21/2019  The 10-year ASCVD risk score Alisha Chen DC Jr., et al., 2013) is: 1.6%   Values used to calculate the score:     Age: 54 years     Sex: Female     Is Non-Hispanic African American: Yes     Diabetic: No     Tobacco smoker: No     Systolic Blood Pressure: 829 mmHg     Is BP treated: No     HDL Cholesterol: 54 mg/dL     Total Cholesterol: 183 mg/dL  5. Insulin resistance Alisha Chen has a diagnosis of insulin resistance based on her elevated fasting insulin level >5. She is not on metformin, she notes it caused a metallic taste in her mouth. Last A1c was 4.8. She continues to work on diet and exercise to decrease her risk of diabetes.  Lab Results  Component Value Date   INSULIN 13.5 05/27/2020   INSULIN 16.6 12/10/2018   Lab Results  Component Value Date   HGBA1C 5.0 05/27/2020   6. Other depression, with emotional eating Alisha Chen's mood is stable. She notes cravings especially at night. She tries not to eat after 6 pm.  Assessment/Plan:   1. Other fatigue We will check labs today. Alisha Chen will continue to follow up as directed.  - Comprehensive metabolic panel - CBC with Differential/Platelet  2. Vitamin  D deficiency Low Vitamin D level contributes to fatigue and are associated with obesity, breast, and colon cancer. We will check labs today. Alisha Chen will follow-up for routine testing of Vitamin D, at least 2-3 times per year to avoid over-replacement.  - VITAMIN D 25 Hydroxy (Vit-D Deficiency, Fractures)  3. Other specified hypothyroidism Patient with long-standing hypothyroidism, on levothyroxine therapy. Montie appears euthyroid. We will check labs today. - Comprehensive metabolic panel - CBC with Differential/Platelet - T4, free - T3 - TSH  4. Other hyperlipidemia Cardiovascular risk and specific lipid/LDL goals reviewed. We discussed several lifestyle modifications today.  Alisha Chen will continue to work on diet, exercise and weight loss efforts. We will check labs today.  - CBC with Differential/Platelet - Lipid Panel With LDL/HDL Ratio  5. Insulin resistance Alisha Chen will continue to work on weight loss, exercise, and decreasing simple carbohydrates to help decrease the risk of diabetes. We will check labs today. Alisha Chen agreed to follow-up with [place] as directed to closely monitor her progress.  - Comprehensive metabolic panel - Hemoglobin A1c - Insulin, random  6. Other depression, with emotional eating Behavior modification techniques were discussed today to help Reniya deal with her emotional/non-hunger eating behaviors. We discussed Topamax but she declined. Orders and follow up as documented in patient record.   7. Class 1 obesity with serious comorbidity and body mass index (BMI) of 31.0 to 31.9 in adult, unspecified obesity type Alisha Chen is currently in the action stage of change. As such, her goal is to continue with weight loss efforts. She has agreed to keeping a food journal and adhering to recommended goals of 1150-1250 calories and 80 grams of protein daily.   Exercise goals: Increase cardio.  Behavioral modification strategies: increasing lean protein intake, decreasing simple carbohydrates, planning for success and keeping a strict food journal.  Alisha Chen has agreed to follow-up with our clinic in 3 weeks. She was informed of the importance of frequent follow-up visits to maximize her success with intensive lifestyle modifications for her multiple health conditions.   Alisha Chen was informed we would discuss her lab results at her next visit unless there is a critical issue that needs to be addressed sooner. Alisha Chen agreed to keep her next visit at the agreed upon time to discuss these results.  Objective:   Blood pressure 105/70, pulse 77, temperature 98.3 F (36.8 C), temperature source Oral, height 5\' 6"  (1.676 m), weight 194 lb (88 kg), last menstrual period  02/27/2012, SpO2 98 %. Body mass index is 31.31 kg/m.  General: Cooperative, alert, well developed, in no acute distress. HEENT: Conjunctivae and lids unremarkable. Cardiovascular: Regular rhythm.  Lungs: Normal work of breathing. Neurologic: No focal deficits.   Lab Results  Component Value Date   CREATININE 0.79 05/27/2020   BUN 20 05/27/2020   NA 140 05/27/2020   K 4.7 05/27/2020   CL 104 05/27/2020   CO2 27 05/27/2020   Lab Results  Component Value Date   ALT 17 05/27/2020   AST 17 05/27/2020   ALKPHOS 102 05/27/2020   BILITOT 0.3 05/27/2020   Lab Results  Component Value Date   HGBA1C 5.0 05/27/2020   HGBA1C 4.8 10/21/2019   HGBA1C 4.9 12/10/2018   Lab Results  Component Value Date   INSULIN 13.5 05/27/2020   INSULIN 16.6 12/10/2018   Lab Results  Component Value Date   TSH 4.020 05/27/2020   Lab Results  Component Value Date   CHOL 183 05/27/2020   HDL 54 05/27/2020   [place]  118 (H) 05/27/2020   TRIG 60 05/27/2020   CHOLHDL 4.0 10/21/2019   Lab Results  Component Value Date   WBC 4.3 05/27/2020   HGB 12.2 05/27/2020   HCT 38.8 05/27/2020   MCV 89 05/27/2020   PLT 227 05/27/2020   No results found for: IRON, TIBC, FERRITIN  Attestation Statements:   Reviewed by clinician on day of visit: allergies, medications, problem list, medical history, surgical history, family history, social history, and previous encounter notes.   Wilhemena Durie, am acting as Location manager for [employer], FNP-C.  I have reviewed the above documentation for accuracy and completeness, and I agree with the above. -  Georgianne Fick, FNP

## 2020-06-08 ENCOUNTER — Other Ambulatory Visit: Payer: Self-pay | Admitting: Family Medicine

## 2020-06-17 ENCOUNTER — Ambulatory Visit (INDEPENDENT_AMBULATORY_CARE_PROVIDER_SITE_OTHER): Payer: BC Managed Care – PPO | Admitting: Family Medicine

## 2020-06-17 ENCOUNTER — Encounter (INDEPENDENT_AMBULATORY_CARE_PROVIDER_SITE_OTHER): Payer: Self-pay | Admitting: Family Medicine

## 2020-06-17 ENCOUNTER — Other Ambulatory Visit: Payer: Self-pay

## 2020-06-17 VITALS — BP 120/75 | HR 75 | Temp 98.3°F | Ht 66.0 in | Wt 194.0 lb

## 2020-06-17 DIAGNOSIS — F3289 Other specified depressive episodes: Secondary | ICD-10-CM

## 2020-06-17 DIAGNOSIS — Z6831 Body mass index (BMI) 31.0-31.9, adult: Secondary | ICD-10-CM

## 2020-06-17 DIAGNOSIS — E669 Obesity, unspecified: Secondary | ICD-10-CM | POA: Diagnosis not present

## 2020-06-17 NOTE — Progress Notes (Signed)
Chief Complaint:   OBESITY Alisha Chen is here to discuss her progress with her obesity treatment plan along with follow-up of her obesity related diagnoses. Alisha Chen is on keeping a food journal and adhering to recommended goals of 1150-1250 calories and 80 grams of protein daily and states she is following her eating plan approximately 60-70% of the time. Alisha Chen states she is exercising with videos for 60 minutes 5 times per week.  Today's visit was #: 49 Starting weight: 201 lbs Starting date: 12/10/2018 Today's weight: 194 lbs Today's date: 06/17/2020 Total lbs lost to date: 7 Total lbs lost since last in-office visit: 0  Interim History: She is disappointed with lack of weight loss since last OV. She is exercising consistently. She sometimes feels she can increase exercise to account for extra calories. Alisha Chen has increased her muscle mass by 11 lbs since starting with our clinic, and she has lost 13 lbs of fat since her first office visit. She is journaling daily and meeting her protein goals. She exceeds her calories most days and averages between 1400-1900 calories per day.   Subjective:   1. Other depression, with emotional eating Alisha Chen notes she struggles with food cravings versus hunger, especially at night.  Assessment/Plan:   1. Other depression, with emotional eating Behavior modification techniques were discussed today to help Alisha Chen deal with her emotional/non-hunger eating behaviors. Alisha Chen declines Topamax and bupropion.   2. Class 1 obesity with serious comorbidity and body mass index (BMI) of 31.0 to 31.9 in adult, unspecified obesity type Alisha Chen is currently in the action stage of change. As such, her goal is to continue with weight loss efforts. She has agreed to keeping a food journal and adhering to recommended goals of 1150-1250 calories and 80 grams of protein daily.   Alisha Chen will try to keep calories at <1300.  Exercise goals: As is.  Behavioral modification  strategies: increasing lean protein intake and decreasing simple carbohydrates.  Alisha Chen has agreed to follow-up with our clinic in 3 weeks. She was informed of the importance of frequent follow-up visits to maximize her success with intensive lifestyle modifications for her multiple health conditions.   Objective:   Blood pressure 120/75, pulse 75, temperature 98.3 F (36.8 C), temperature source Oral, height 5\' 6"  (1.676 m), weight 194 lb (88 kg), last menstrual period 02/27/2012, SpO2 97 %. Body mass index is 31.31 kg/m.  General: Cooperative, alert, well developed, in no acute distress. HEENT: Conjunctivae and lids unremarkable. Cardiovascular: Regular rhythm.  Lungs: Normal work of breathing. Neurologic: No focal deficits.   Lab Results  Component Value Date   CREATININE 0.79 05/27/2020   BUN 20 05/27/2020   NA 140 05/27/2020   K 4.7 05/27/2020   CL 104 05/27/2020   CO2 27 05/27/2020   Lab Results  Component Value Date   ALT 17 05/27/2020   AST 17 05/27/2020   ALKPHOS 102 05/27/2020   BILITOT 0.3 05/27/2020   Lab Results  Component Value Date   HGBA1C 5.0 05/27/2020   HGBA1C 4.8 10/21/2019   HGBA1C 4.9 12/10/2018   Lab Results  Component Value Date   INSULIN 13.5 05/27/2020   INSULIN 16.6 12/10/2018   Lab Results  Component Value Date   TSH 4.020 05/27/2020   Lab Results  Component Value Date   CHOL 183 05/27/2020   HDL 54 05/27/2020   LDLCALC 118 (H) 05/27/2020   TRIG 60 05/27/2020   CHOLHDL 4.0 10/21/2019   Lab Results  Component Value  Date   WBC 4.3 05/27/2020   HGB 12.2 05/27/2020   HCT 38.8 05/27/2020   MCV 89 05/27/2020   PLT 227 05/27/2020   No results found for: IRON, TIBC, FERRITIN  Attestation Statements:   Reviewed by clinician on day of visit: allergies, medications, problem list, medical history, surgical history, family history, social history, and previous encounter notes.   Wilhemena Durie, am acting as Location manager for  [employer], FNP-C.  I have reviewed the above documentation for accuracy and completeness, and I agree with the above. -  Georgianne Fick, FNP

## 2020-07-07 ENCOUNTER — Ambulatory Visit (INDEPENDENT_AMBULATORY_CARE_PROVIDER_SITE_OTHER): Payer: BC Managed Care – PPO | Admitting: Family Medicine

## 2020-07-09 ENCOUNTER — Ambulatory Visit (INDEPENDENT_AMBULATORY_CARE_PROVIDER_SITE_OTHER): Payer: BC Managed Care – PPO | Admitting: Family Medicine

## 2020-07-09 ENCOUNTER — Other Ambulatory Visit: Payer: Self-pay

## 2020-07-09 ENCOUNTER — Encounter (INDEPENDENT_AMBULATORY_CARE_PROVIDER_SITE_OTHER): Payer: Self-pay | Admitting: Family Medicine

## 2020-07-09 VITALS — BP 121/77 | HR 85 | Temp 98.4°F | Ht 66.0 in | Wt 192.0 lb

## 2020-07-09 DIAGNOSIS — Z6831 Body mass index (BMI) 31.0-31.9, adult: Secondary | ICD-10-CM | POA: Diagnosis not present

## 2020-07-09 DIAGNOSIS — E8881 Metabolic syndrome: Secondary | ICD-10-CM

## 2020-07-09 DIAGNOSIS — E669 Obesity, unspecified: Secondary | ICD-10-CM

## 2020-07-13 ENCOUNTER — Encounter (INDEPENDENT_AMBULATORY_CARE_PROVIDER_SITE_OTHER): Payer: Self-pay | Admitting: Family Medicine

## 2020-07-13 NOTE — Progress Notes (Signed)
Chief Complaint:   OBESITY Alisha Chen is here to discuss her progress with her obesity treatment plan along with follow-up of her obesity related diagnoses. Alisha Chen is on keeping a food journal and adhering to recommended goals of 1150-1250 calories and 80 grams of protein daily and states she is following her eating plan approximately 50-70% of the time. Alisha Chen states she is exercising with videos for 60 minutes 1-3 times per week.  Today's visit was #: 77 Starting weight: 201 lbs Starting date: 12/10/2018 Today's weight: 192 lbs Today's date: 07/09/2020 Total lbs lost to date: 11 Total lbs lost since last in-office visit: 2  Interim History: Alisha Chen is trying not to eat after 6 pm and she has reduced her sugar intake. She averages between 1200-1400 calories per day, and averages at 80 grams of protein per day. She has occasional sweet tea but she mostly drinks water.  Subjective:   1. Insulin resistance Alisha Chen has a diagnosis of insulin resistance based on her elevated fasting insulin level >5. She denies polyphagia, and sometimes she goes to bed early to keep from eating. She is not on metformin. She continues to work on diet and exercise to decrease her risk of diabetes.  Lab Results  Component Value Date   INSULIN 13.5 05/27/2020   INSULIN 16.6 12/10/2018   Lab Results  Component Value Date   HGBA1C 5.0 05/27/2020   Assessment/Plan:   1. Insulin resistance Alisha Chen will continue her meal plan, and will continue to work on weight loss, exercise, and decreasing simple carbohydrates to help decrease the risk of diabetes. Alisha Chen agreed to follow-up with [place] as directed to closely monitor her progress.  2. Class 1 obesity with serious comorbidity and body mass index (BMI) of 31.0 to 31.9 in adult, unspecified obesity type Alisha Chen is currently in the action stage of change. As such, her goal is to continue with weight loss efforts. She has agreed to keeping a food journal and adhering to  recommended goals of 1150-1250 calories and 80 grams of protein daily.   We will recheck her IC at her next office visit.  Exercise goals: As is.  Behavioral modification strategies: increasing lean protein intake and decreasing simple carbohydrates.  Alisha Chen has agreed to follow-up with our clinic in 4 weeks. She was informed of the importance of frequent follow-up visits to maximize her success with intensive lifestyle modifications for her multiple health conditions.   Objective:   Blood pressure 121/77, pulse 85, temperature 98.4 F (36.9 C), temperature source Oral, height 5\' 6"  (1.676 m), weight 192 lb (87.1 kg), last menstrual period 02/27/2012, SpO2 95 %. Body mass index is 30.99 kg/m.  General: Cooperative, alert, well developed, in no acute distress. HEENT: Conjunctivae and lids unremarkable. Cardiovascular: Regular rhythm.  Lungs: Normal work of breathing. Neurologic: No focal deficits.   Lab Results  Component Value Date   CREATININE 0.79 05/27/2020   BUN 20 05/27/2020   NA 140 05/27/2020   K 4.7 05/27/2020   CL 104 05/27/2020   CO2 27 05/27/2020   Lab Results  Component Value Date   ALT 17 05/27/2020   AST 17 05/27/2020   ALKPHOS 102 05/27/2020   BILITOT 0.3 05/27/2020   Lab Results  Component Value Date   HGBA1C 5.0 05/27/2020   HGBA1C 4.8 10/21/2019   HGBA1C 4.9 12/10/2018   Lab Results  Component Value Date   INSULIN 13.5 05/27/2020   INSULIN 16.6 12/10/2018   Lab Results  Component Value Date  TSH 4.020 05/27/2020   Lab Results  Component Value Date   CHOL 183 05/27/2020   HDL 54 05/27/2020   LDLCALC 118 (H) 05/27/2020   TRIG 60 05/27/2020   CHOLHDL 4.0 10/21/2019   Lab Results  Component Value Date   WBC 4.3 05/27/2020   HGB 12.2 05/27/2020   HCT 38.8 05/27/2020   MCV 89 05/27/2020   PLT 227 05/27/2020   No results found for: IRON, TIBC, FERRITIN  Attestation Statements:   Reviewed by clinician on day of visit: allergies,  medications, problem list, medical history, surgical history, family history, social history, and previous encounter notes.   Wilhemena Durie, am acting as Location manager for [employer], FNP-C.  I have reviewed the above documentation for accuracy and completeness, and I agree with the above. -  Georgianne Fick, FNP

## 2020-08-04 ENCOUNTER — Other Ambulatory Visit: Payer: Self-pay

## 2020-08-04 ENCOUNTER — Ambulatory Visit (INDEPENDENT_AMBULATORY_CARE_PROVIDER_SITE_OTHER): Payer: BC Managed Care – PPO | Admitting: Family Medicine

## 2020-08-04 ENCOUNTER — Encounter (INDEPENDENT_AMBULATORY_CARE_PROVIDER_SITE_OTHER): Payer: Self-pay | Admitting: Family Medicine

## 2020-08-04 VITALS — BP 126/81 | HR 76 | Temp 98.3°F | Ht 66.0 in | Wt 195.0 lb

## 2020-08-04 DIAGNOSIS — F3289 Other specified depressive episodes: Secondary | ICD-10-CM

## 2020-08-04 DIAGNOSIS — Z9189 Other specified personal risk factors, not elsewhere classified: Secondary | ICD-10-CM | POA: Diagnosis not present

## 2020-08-04 DIAGNOSIS — R0600 Dyspnea, unspecified: Secondary | ICD-10-CM

## 2020-08-04 DIAGNOSIS — R0609 Other forms of dyspnea: Secondary | ICD-10-CM

## 2020-08-04 DIAGNOSIS — Z6831 Body mass index (BMI) 31.0-31.9, adult: Secondary | ICD-10-CM

## 2020-08-04 DIAGNOSIS — E669 Obesity, unspecified: Secondary | ICD-10-CM | POA: Diagnosis not present

## 2020-08-05 ENCOUNTER — Encounter (INDEPENDENT_AMBULATORY_CARE_PROVIDER_SITE_OTHER): Payer: Self-pay | Admitting: Family Medicine

## 2020-08-05 NOTE — Progress Notes (Signed)
Chief Complaint:   OBESITY Alisha Chen is here to discuss her progress with her obesity treatment plan along with follow-up of her obesity related diagnoses. Alisha Chen is on keeping a food journal and adhering to recommended goals of 1150-1250 calories and 80 grams of protein daily and states she is following her eating plan approximately 60-70% of the time. Alisha Chen states she is walking for 60 minutes 2 times per week.  Today's visit was #: 28 Starting weight: 201 lbs Starting date: 12/10/2018 Today's weight: 195 lbs Today's date: 08/04/2020 Total lbs lost to date: 6 Total lbs lost since last in-office visit: 0  Interim History: Alisha Chen repeat IC was done today. Her RMR relatively stable, only slightly decreased from initial evaluation. It is 1419 compared to 1457 in January 2020.  She is tracking consistently and she tends to go over on calories and under on protein. She feels she eats too much due to stress and hunger. Her family has been using one car and this has been hard, and it has impacted her exercise and eating. She has been on Saxenda in the past but had severe constipation. Metformin caused dysgeusia.   Subjective:   1. Dyspnea on exertion Alisha Chen reports increase in shortness of breath with exertion.  2. Other depression, with emotional eating Alisha Chen feels her mood is stable overall. She does note eating due to stress at times. She feels stress will decrease because her family has gotten a second car.  3. At risk for deficient intake of food The patient is at a higher than average risk of deficient intake of food due to insufficient protein.  Assessment/Plan:   1. Dyspnea on exertion IC was done today, and Alisha Chen will continue to follow up as directed.  2. Other depression, with emotional eating Behavior modification techniques were discussed today to help Alisha Chen deal with her emotional/non-hunger eating behaviors. Alisha Chen declines bupropion.   3. At risk for deficient intake of  food Alisha Chen was given approximately 15 minutes of deficit intake of food prevention counseling today. Alisha Chen is at risk for eating too little protein based on current food recall. She was encouraged to focus on meeting caloric and protein goals according to her recommended meal plan.   4. Class 1 obesity with serious comorbidity and body mass index (BMI) of 31.0 to 31.9 in adult, unspecified obesity type Alisha Chen is currently in the action stage of change. As such, her goal is to continue with weight loss efforts. She has agreed to keeping a food journal and adhering to recommended goals of 1150-1250 calories and 80 grams of protein daily.   Exercise goals: Increase exercise.  Behavioral modification strategies: increasing lean protein intake and planning for success.  Aalina has agreed to follow-up with our clinic in 3 weeks. She was informed of the importance of frequent follow-up visits to maximize her success with intensive lifestyle modifications for her multiple health conditions.   Objective:   Blood pressure 126/81, pulse 76, temperature 98.3 F (36.8 C), temperature source Oral, height 5\' 6"  (1.676 m), weight 195 lb (88.5 kg), last menstrual period 02/27/2012, SpO2 100 %. Body mass index is 31.47 kg/m.  General: Cooperative, alert, well developed, in no acute distress. HEENT: Conjunctivae and lids unremarkable. Cardiovascular: Regular rhythm.  Lungs: Normal work of breathing. Neurologic: No focal deficits.   Lab Results  Component Value Date   CREATININE 0.79 05/27/2020   BUN 20 05/27/2020   NA 140 05/27/2020   K 4.7 05/27/2020   CL  104 05/27/2020   CO2 27 05/27/2020   Lab Results  Component Value Date   ALT 17 05/27/2020   AST 17 05/27/2020   ALKPHOS 102 05/27/2020   BILITOT 0.3 05/27/2020   Lab Results  Component Value Date   HGBA1C 5.0 05/27/2020   HGBA1C 4.8 10/21/2019   HGBA1C 4.9 12/10/2018   Lab Results  Component Value Date   INSULIN 13.5 05/27/2020    INSULIN 16.6 12/10/2018   Lab Results  Component Value Date   TSH 4.020 05/27/2020   Lab Results  Component Value Date   CHOL 183 05/27/2020   HDL 54 05/27/2020   LDLCALC 118 (H) 05/27/2020   TRIG 60 05/27/2020   CHOLHDL 4.0 10/21/2019   Lab Results  Component Value Date   WBC 4.3 05/27/2020   HGB 12.2 05/27/2020   HCT 38.8 05/27/2020   MCV 89 05/27/2020   PLT 227 05/27/2020   No results found for: IRON, TIBC, FERRITIN  Attestation Statements:   Reviewed by clinician on day of visit: allergies, medications, problem list, medical history, surgical history, family history, social history, and previous encounter notes.   Wilhemena Durie, am acting as Location manager for [employer], FNP-C.  I have reviewed the above documentation for accuracy and completeness, and I agree with the above. -  Georgianne Fick, FNP

## 2020-08-26 ENCOUNTER — Other Ambulatory Visit: Payer: Self-pay

## 2020-08-26 ENCOUNTER — Encounter (INDEPENDENT_AMBULATORY_CARE_PROVIDER_SITE_OTHER): Payer: Self-pay | Admitting: Family Medicine

## 2020-08-26 ENCOUNTER — Ambulatory Visit (INDEPENDENT_AMBULATORY_CARE_PROVIDER_SITE_OTHER): Payer: BC Managed Care – PPO | Admitting: Family Medicine

## 2020-08-26 VITALS — BP 120/80 | HR 81 | Temp 98.0°F | Ht 66.0 in | Wt 194.0 lb

## 2020-08-26 DIAGNOSIS — Z6831 Body mass index (BMI) 31.0-31.9, adult: Secondary | ICD-10-CM

## 2020-08-26 DIAGNOSIS — E8881 Metabolic syndrome: Secondary | ICD-10-CM | POA: Diagnosis not present

## 2020-08-26 DIAGNOSIS — E669 Obesity, unspecified: Secondary | ICD-10-CM

## 2020-08-26 DIAGNOSIS — E88819 Insulin resistance, unspecified: Secondary | ICD-10-CM

## 2020-08-26 NOTE — Progress Notes (Signed)
Chief Complaint:   OBESITY Alisha Chen is here to discuss her progress with her obesity treatment plan along with follow-up of her obesity related diagnoses. Alisha Chen is on keeping a food journal and adhering to recommended goals of 1150-1250 calories and 80 grams of protein daily and states she is following her eating plan approximately 60% of the time. Alisha Chen states she is doing water aerobics for 60 minutes 3 times per week.  Today's visit was #: 84 Starting weight: 201 lbs Starting date: 12/10/2018 Today's weight: 194 lbs Today's date: 08/26/2020 Total lbs lost to date: 7 Total lbs lost since last in-office visit: 1  Interim History: Alisha Chen is doing better with protein intake, but still exceeds calories and averages between 1350 and 1650 calories per day. She notes she does worse when she travels. She does feel her hunger is less when she gets more protein early in the day. She is not meal prepping and she tends to eat out at breakfast and lunch. She sometimes runs out of calories to use by lunch time. As an example, she used 500 calories for breakfast this am with a protein shake (150 cal) and a purchased breakfast sandwich (350 cal).  Subjective:   1. Insulin resistance  She notes polyphagia if she does not eat enough protein early in the day. She has been on metformin and Saxenda, and had intolerable side effects with both.   Lab Results  Component Value Date   INSULIN 13.5 05/27/2020   INSULIN 16.6 12/10/2018   Lab Results  Component Value Date   HGBA1C 5.0 05/27/2020   Assessment/Plan:   1. Insulin resistance Alisha Chen declines medications at this time, and will continue her meal plan.   2. Class 1 obesity with serious comorbidity and body mass index (BMI) of 31.0 to 31.9 in adult, unspecified obesity type Alisha Chen is currently in the action stage of change. As such, her goal is to continue with weight loss efforts. She has agreed to keeping a food journal and adhering to recommended  goals of 1150-1250 calories and 90 grams of protein daily.    I was considering Contrave for her but she declines medications at this time.   Exercise goals: As is.  Behavioral modification strategies: meal planning and cooking strategies and keeping a strict food journal. We discussed a calorie and protein breakdown to use at each meal. I encouraged her to pack breakfast and lunch the night before her work day. She tells me that she does not have time to meal prep in the am.   Alisha Chen has agreed to follow-up with our clinic in 4 weeks.   Objective:   Blood pressure 120/80, pulse 81, temperature 98 F (36.7 C), height 5\' 6"  (1.676 m), weight 194 lb (88 kg), last menstrual period 02/27/2012. Body mass index is 31.31 kg/m.  General: Cooperative, alert, well developed, in no acute distress. HEENT: Conjunctivae and lids unremarkable. Cardiovascular: Regular rhythm.  Lungs: Normal work of breathing. Neurologic: No focal deficits.   Lab Results  Component Value Date   CREATININE 0.79 05/27/2020   BUN 20 05/27/2020   NA 140 05/27/2020   K 4.7 05/27/2020   CL 104 05/27/2020   CO2 27 05/27/2020   Lab Results  Component Value Date   ALT 17 05/27/2020   AST 17 05/27/2020   ALKPHOS 102 05/27/2020   BILITOT 0.3 05/27/2020   Lab Results  Component Value Date   HGBA1C 5.0 05/27/2020   HGBA1C 4.8 10/21/2019  HGBA1C 4.9 12/10/2018   Lab Results  Component Value Date   INSULIN 13.5 05/27/2020   INSULIN 16.6 12/10/2018   Lab Results  Component Value Date   TSH 4.020 05/27/2020   Lab Results  Component Value Date   CHOL 183 05/27/2020   HDL 54 05/27/2020   LDLCALC 118 (H) 05/27/2020   TRIG 60 05/27/2020   CHOLHDL 4.0 10/21/2019   Lab Results  Component Value Date   WBC 4.3 05/27/2020   HGB 12.2 05/27/2020   HCT 38.8 05/27/2020   MCV 89 05/27/2020   PLT 227 05/27/2020   No results found for: IRON, TIBC, FERRITIN  Attestation Statements:   Reviewed by clinician on  day of visit: allergies, medications, problem list, medical history, surgical history, family history, social history, and previous encounter notes.   Wilhemena Durie, am acting as Location manager for [employer], FNP-C.  I have reviewed the above documentation for accuracy and completeness, and I agree with the above. -  Georgianne Fick, FNP

## 2020-09-23 ENCOUNTER — Encounter (INDEPENDENT_AMBULATORY_CARE_PROVIDER_SITE_OTHER): Payer: Self-pay | Admitting: Family Medicine

## 2020-09-23 ENCOUNTER — Ambulatory Visit (INDEPENDENT_AMBULATORY_CARE_PROVIDER_SITE_OTHER): Payer: BC Managed Care – PPO | Admitting: Family Medicine

## 2020-09-23 ENCOUNTER — Other Ambulatory Visit: Payer: Self-pay

## 2020-09-23 VITALS — BP 109/72 | HR 72 | Temp 97.8°F | Ht 66.0 in | Wt 195.0 lb

## 2020-09-23 DIAGNOSIS — E8881 Metabolic syndrome: Secondary | ICD-10-CM

## 2020-09-23 DIAGNOSIS — E669 Obesity, unspecified: Secondary | ICD-10-CM

## 2020-09-23 DIAGNOSIS — Z6831 Body mass index (BMI) 31.0-31.9, adult: Secondary | ICD-10-CM

## 2020-09-23 NOTE — Progress Notes (Signed)
Chief Complaint:   OBESITY Alisha Chen is here to discuss her progress with her obesity treatment plan along with follow-up of her obesity related diagnoses. Alisha Chen is on keeping a food journal and adhering to recommended goals of 1150-1250 calories and 90 grams of protein daily and states she is following her eating plan approximately 50% of the time. Alisha Chen states she is walking, doing aerobics, and on the elliptical machine for 30-60 minutes 3 times per week.  Today's visit was #: 13 Starting weight: 201 lbs Starting date: 12/10/2018 Today's weight: 195 lbs Today's date: 09/23/2020 Total lbs lost to date: 6 Total lbs lost since last in-office visit: 0  Interim History: Alisha Chen is snacking too much in the evening. She is journaling but not counting all of her snacks. She says that she runs out of calories if she eats early in the day.  Subjective:   1. Insulin resistance Alisha Chen notes polyphagia when she eats breakfast. She is less hungry when she waits until lunch to eat. She is not on metformin, and last A1c was 5.0.  She has been on both Saxenda nd metformin in the past and had intolerable side effects with both.   Lab Results  Component Value Date   INSULIN 13.5 05/27/2020   INSULIN 16.6 12/10/2018   Lab Results  Component Value Date   HGBA1C 5.0 05/27/2020   Assessment/Plan:   1. Insulin resistance Alisha Chen will continue to work on weight loss, exercise, and decreasing simple carbohydrates to help decrease the risk of diabetes. We will try intermittent fasting with an 8 hour eating window daily.  2. Class 1 obesity with serious comorbidity and body mass index (BMI) of 31.0 to 31.9 in adult, unspecified obesity type Alisha Chen is currently in the action stage of change. As such, her goal is to continue with weight loss efforts. She has agreed to keeping a food journal and adhering to recommended goals of 1150-1250 calories and 80-90 grams of protein daily.   Alisha Chen will journal  consistently all food. She will eat between 12 pm and 8 pm (8 hour window).  Exercise goals: As is.  Behavioral modification strategies: As is, and add more resistance training.  Alisha Chen has agreed to follow-up with our clinic in 3 weeks.   Objective:   Blood pressure 109/72, pulse 72, temperature 97.8 F (36.6 C), height 5\' 6"  (1.676 m), weight 195 lb (88.5 kg), last menstrual period 02/27/2012, SpO2 100 %. Body mass index is 31.47 kg/m.  General: Cooperative, alert, well developed, in no acute distress. HEENT: Conjunctivae and lids unremarkable. Cardiovascular: Regular rhythm.  Lungs: Normal work of breathing. Neurologic: No focal deficits.   Lab Results  Component Value Date   CREATININE 0.79 05/27/2020   BUN 20 05/27/2020   NA 140 05/27/2020   K 4.7 05/27/2020   CL 104 05/27/2020   CO2 27 05/27/2020   Lab Results  Component Value Date   ALT 17 05/27/2020   AST 17 05/27/2020   ALKPHOS 102 05/27/2020   BILITOT 0.3 05/27/2020   Lab Results  Component Value Date   HGBA1C 5.0 05/27/2020   HGBA1C 4.8 10/21/2019   HGBA1C 4.9 12/10/2018   Lab Results  Component Value Date   INSULIN 13.5 05/27/2020   INSULIN 16.6 12/10/2018   Lab Results  Component Value Date   TSH 4.020 05/27/2020   Lab Results  Component Value Date   CHOL 183 05/27/2020   HDL 54 05/27/2020   LDLCALC 118 (H) 05/27/2020  TRIG 60 05/27/2020   CHOLHDL 4.0 10/21/2019   Lab Results  Component Value Date   WBC 4.3 05/27/2020   HGB 12.2 05/27/2020   HCT 38.8 05/27/2020   MCV 89 05/27/2020   PLT 227 05/27/2020   No results found for: IRON, TIBC, FERRITIN  Attestation Statements:   Reviewed by clinician on day of visit: allergies, medications, problem list, medical history, surgical history, family history, social history, and previous encounter notes.   Wilhemena Durie, am acting as Location manager for [employer], FNP-C.  I have reviewed the above documentation for accuracy and  completeness, and I agree with the above. -  Georgianne Fick, FNP

## 2020-10-14 ENCOUNTER — Other Ambulatory Visit: Payer: Self-pay

## 2020-10-14 ENCOUNTER — Ambulatory Visit (INDEPENDENT_AMBULATORY_CARE_PROVIDER_SITE_OTHER): Payer: BC Managed Care – PPO | Admitting: Family Medicine

## 2020-10-14 ENCOUNTER — Encounter (INDEPENDENT_AMBULATORY_CARE_PROVIDER_SITE_OTHER): Payer: Self-pay | Admitting: Family Medicine

## 2020-10-14 VITALS — BP 114/71 | HR 68 | Temp 97.6°F | Ht 66.0 in | Wt 194.0 lb

## 2020-10-14 DIAGNOSIS — E8881 Metabolic syndrome: Secondary | ICD-10-CM | POA: Diagnosis not present

## 2020-10-14 DIAGNOSIS — E669 Obesity, unspecified: Secondary | ICD-10-CM | POA: Diagnosis not present

## 2020-10-14 DIAGNOSIS — Z6831 Body mass index (BMI) 31.0-31.9, adult: Secondary | ICD-10-CM

## 2020-10-20 NOTE — Progress Notes (Signed)
Chief Complaint:   OBESITY Alisha Chen is here to discuss her progress with her obesity treatment plan along with follow-up of her obesity related diagnoses. Alisha Chen is on keeping a food journal and adhering to recommended goals of 1150-1250 calories and 80-90 grams of protein and states she is following her eating plan approximately 50-60% of the time. Alisha Chen states she is not exercising regularly at this time.  Today's visit was #: 108 Starting weight: 201 lbs Starting date: 12/10/2018 Today's weight: 194 lbs Today's date: 10/14/2020 Total lbs lost to date: 7 lbs Total lbs lost since last in-office visit: 1 lb  Interim History: Alisha Chen has been doing intermittent fasting and eating only between 12 pm and 8 pm.  She would like to cut off earlier at 6 pm.  She averages 1200-1350 calories daily.  She is still talking with a group of girls for support.  She has not exercised recently.  She says she is eating out for lunch most of the time.  Subjective:   1. Insulin resistance She says she feels that her hunger is better controlled with intermittent fasting.  Lab Results  Component Value Date   INSULIN 13.5 05/27/2020   INSULIN 16.6 12/10/2018   Lab Results  Component Value Date   HGBA1C 5.0 05/27/2020   Assessment/Plan:   1. Insulin resistance Continue intermittent fasting.  2. Class 1 obesity with serious comorbidity and body mass index (BMI) of 31.0 to 31.9 in adult, unspecified obesity type  Alisha Chen is currently in the action stage of change. As such, her goal is to continue with weight loss efforts. She has agreed to keeping a food journal and adhering to recommended goals of 1150-1250 calories and 80-90 grams of protein daily.  Change eating period to 11 am to 6 pm.    Encouraged her to pack lunch more often.  Exercise goals: Get back to exercising 3 times per week.  Behavioral modification strategies: decreasing eating out.  Alisha Chen has agreed to follow-up with our clinic in  3 weeks.  Objective:   Blood pressure 114/71, pulse 68, temperature 97.6 F (36.4 C), height 5\' 6"  (1.676 m), weight 194 lb (88 kg), last menstrual period 02/27/2012, SpO2 98 %. Body mass index is 31.31 kg/m.  General: Cooperative, alert, well developed, in no acute distress. HEENT: Conjunctivae and lids unremarkable. Cardiovascular: Regular rhythm.  Lungs: Normal work of breathing. Neurologic: No focal deficits.   Lab Results  Component Value Date   CREATININE 0.79 05/27/2020   BUN 20 05/27/2020   NA 140 05/27/2020   K 4.7 05/27/2020   CL 104 05/27/2020   CO2 27 05/27/2020   Lab Results  Component Value Date   ALT 17 05/27/2020   AST 17 05/27/2020   ALKPHOS 102 05/27/2020   BILITOT 0.3 05/27/2020   Lab Results  Component Value Date   HGBA1C 5.0 05/27/2020   HGBA1C 4.8 10/21/2019   HGBA1C 4.9 12/10/2018   Lab Results  Component Value Date   INSULIN 13.5 05/27/2020   INSULIN 16.6 12/10/2018   Lab Results  Component Value Date   TSH 4.020 05/27/2020   Lab Results  Component Value Date   CHOL 183 05/27/2020   HDL 54 05/27/2020   LDLCALC 118 (H) 05/27/2020   TRIG 60 05/27/2020   CHOLHDL 4.0 10/21/2019   Lab Results  Component Value Date   WBC 4.3 05/27/2020   HGB 12.2 05/27/2020   HCT 38.8 05/27/2020   MCV 89 05/27/2020   PLT  227 05/27/2020   Attestation Statements:   Reviewed by clinician on day of visit: allergies, medications, problem list, medical history, surgical history, family history, social history, and previous encounter notes.  I, Water quality scientist, CMA, am acting as Location manager for [employer], [place].  I have reviewed the above documentation for accuracy and completeness, and I agree with the above. -  Georgianne Fick, FNP

## 2020-10-26 ENCOUNTER — Other Ambulatory Visit: Payer: Self-pay | Admitting: Hematology and Oncology

## 2020-10-26 DIAGNOSIS — Z1231 Encounter for screening mammogram for malignant neoplasm of breast: Secondary | ICD-10-CM

## 2020-11-03 ENCOUNTER — Encounter (INDEPENDENT_AMBULATORY_CARE_PROVIDER_SITE_OTHER): Payer: Self-pay | Admitting: Family Medicine

## 2020-11-07 ENCOUNTER — Other Ambulatory Visit: Payer: Self-pay | Admitting: Family Medicine

## 2020-11-10 ENCOUNTER — Ambulatory Visit (INDEPENDENT_AMBULATORY_CARE_PROVIDER_SITE_OTHER): Payer: BC Managed Care – PPO | Admitting: Family Medicine

## 2020-11-10 ENCOUNTER — Other Ambulatory Visit: Payer: Self-pay

## 2020-11-10 ENCOUNTER — Encounter (INDEPENDENT_AMBULATORY_CARE_PROVIDER_SITE_OTHER): Payer: Self-pay | Admitting: Family Medicine

## 2020-11-10 VITALS — BP 106/65 | HR 75 | Temp 97.7°F | Ht 66.0 in | Wt 197.0 lb

## 2020-11-10 DIAGNOSIS — E669 Obesity, unspecified: Secondary | ICD-10-CM

## 2020-11-10 DIAGNOSIS — E559 Vitamin D deficiency, unspecified: Secondary | ICD-10-CM | POA: Diagnosis not present

## 2020-11-10 DIAGNOSIS — E8881 Metabolic syndrome: Secondary | ICD-10-CM | POA: Diagnosis not present

## 2020-11-10 DIAGNOSIS — E7849 Other hyperlipidemia: Secondary | ICD-10-CM | POA: Diagnosis not present

## 2020-11-10 DIAGNOSIS — E88819 Insulin resistance, unspecified: Secondary | ICD-10-CM

## 2020-11-10 DIAGNOSIS — E038 Other specified hypothyroidism: Secondary | ICD-10-CM

## 2020-11-10 DIAGNOSIS — Z6831 Body mass index (BMI) 31.0-31.9, adult: Secondary | ICD-10-CM

## 2020-11-10 NOTE — Progress Notes (Signed)
Chief Complaint:   OBESITY Alisha Chen is here to discuss her progress with her obesity treatment plan along with follow-up of her obesity related diagnoses. [name] is on keeping a food journal and adhering to recommended goals of 1150-1250 calories and 80-90 grams of protein and states she is following her eating plan approximately 60% of the time. [name] states she is doing cardio for 20 minutes 1-3 times per week.  Today's visit was #: 73 Starting weight: 201 lbs Starting date: 12/10/2018 Today's weight: 197 lbs Today's date: 11/10/2020 Total lbs lost to date: 4 lbs Total lbs lost since last in-office visit: 0  Interim History: [name] is up 3 pounds today.  She journals consistently.  She is getting in the protein most days, but going over on calories.  This has consistently been an issue. She is limiting eating hours between 11 am and 6 pm.  She does sometimes eat later than 6 pm when she is unable to get dinner before 6.  Exercise amount is less than usual.  She had been exercising with a group of women from church but they took the holidays off.  Subjective:   1. Other hyperlipidemia Last LDL elevated at 1187.  HDL and triglycerides at goal.  She is not on a statin.  Lab Results  Component Value Date   ALT 17 05/27/2020   AST 17 05/27/2020   ALKPHOS 102 05/27/2020   BILITOT 0.3 05/27/2020   Lab Results  Component Value Date   CHOL 183 05/27/2020   HDL 54 05/27/2020   LDLCALC 118 (H) 05/27/2020   TRIG 60 05/27/2020   CHOLHDL 4.0 10/21/2019  The 10-year ASCVD risk score Mikey Bussing DC Jr., et al., 2013) is: 1.6%   Values used to calculate the score:     Age: 54 years     Sex: Female     Is Non-Hispanic African American: Yes     Diabetic: No     Tobacco smoker: No     Systolic Blood Pressure: 329 mmHg     Is BP treated: No     HDL Cholesterol: 54 mg/dL     Total Cholesterol: 183 mg/dL  2. Other specified hypothyroidism Stable on 50 mg levothyroxine daily.  Denies  palpitations or heat/cold intolerance.     Lab Results  Component Value Date   TSH 4.020 05/27/2020   3. Insulin resistance Last A1c 5.0.  Fast insulin slightly elevated at 13.5.  She is not on metformin (caused dysgeusia).  Denies excessive appetite.  Does better if she cuts off eating at 6 pm.   Lab Results  Component Value Date   INSULIN 13.5 05/27/2020   INSULIN 16.6 12/10/2018   Lab Results  Component Value Date   HGBA1C 5.0 05/27/2020   4. Vitamin D deficiency Vitamin D at toal (54.1).  On OTC vitamin D 1,000 IU daily.  Assessment/Plan:   1. Other hyperlipidemia Will check FLP today. - Lipid Panel With LDL/HDL Ratio  2. Other specified hypothyroidism Check TSH today.  Continue levothyroxine 50 mcg daily. - TSH  3. Insulin resistance Check fasting insulin/glucose and A1c today.  - Insulin, random - Hemoglobin A1c - Comprehensive metabolic panel  4. Vitamin D deficiency Continue OTC vitamin D 1,000 IU daily.  - VITAMIN D 25 Hydroxy (Vit-D Deficiency, Fractures)  5. Class 1 obesity with serious comorbidity and body mass index (BMI) of 31.0 to 31.9 in adult, unspecified obesity type  [name] is currently in the action stage of  change. As such, her goal is to continue with weight loss efforts. She has agreed to keeping a food journal and adhering to recommended goals of 1150-1250 calories and 80 grams of protein daily.   Exercise goals: She will work on increasing her exercise.  Behavioral modification strategies: decreasing simple carbohydrates and planning for success.  [name] has agreed to follow-up with our clinic in 3-4 weeks.  [name] was informed we would discuss her lab results at her next visit unless there is a critical issue that needs to be addressed sooner. [name] agreed to keep her next visit at the agreed upon time to discuss these results.  Objective:   Blood pressure 106/65, pulse 75, temperature 97.7 F (36.5 C), height 5\' 6"  (1.676 m), weight 197  lb (89.4 kg), last menstrual period 02/27/2012, SpO2 99 %. Body mass index is 31.8 kg/m.  General: Cooperative, alert, well developed, in no acute distress. HEENT: Conjunctivae and lids unremarkable. Cardiovascular: Regular rhythm.  Lungs: Normal work of breathing. Neurologic: No focal deficits.   Lab Results  Component Value Date   CREATININE 0.79 05/27/2020   BUN 20 05/27/2020   NA 140 05/27/2020   K 4.7 05/27/2020   CL 104 05/27/2020   CO2 27 05/27/2020   Lab Results  Component Value Date   ALT 17 05/27/2020   AST 17 05/27/2020   ALKPHOS 102 05/27/2020   BILITOT 0.3 05/27/2020   Lab Results  Component Value Date   HGBA1C 5.0 05/27/2020   HGBA1C 4.8 10/21/2019   HGBA1C 4.9 12/10/2018   Lab Results  Component Value Date   INSULIN 13.5 05/27/2020   INSULIN 16.6 12/10/2018   Lab Results  Component Value Date   TSH 4.020 05/27/2020   Lab Results  Component Value Date   CHOL 183 05/27/2020   HDL 54 05/27/2020   LDLCALC 118 (H) 05/27/2020   TRIG 60 05/27/2020   CHOLHDL 4.0 10/21/2019   Lab Results  Component Value Date   WBC 4.3 05/27/2020   HGB 12.2 05/27/2020   HCT 38.8 05/27/2020   MCV 89 05/27/2020   PLT 227 05/27/2020   Attestation Statements:   Reviewed by clinician on day of visit: allergies, medications, problem list, medical history, surgical history, family history, social history, and previous encounter notes.  I, Water quality scientist, CMA, am acting as Location manager for [employer], [place].  I have reviewed the above documentation for accuracy and completeness, and I agree with the above. - Georgianne Fick, FNP

## 2020-11-11 LAB — COMPREHENSIVE METABOLIC PANEL
ALT: 18 IU/L (ref 0–32)
AST: 16 IU/L (ref 0–40)
Albumin/Globulin Ratio: 1.6 (ref 1.2–2.2)
Albumin: 4.2 g/dL (ref 3.8–4.9)
Alkaline Phosphatase: 108 IU/L (ref 44–121)
BUN/Creatinine Ratio: 23 (ref 9–23)
BUN: 18 mg/dL (ref 6–24)
Bilirubin Total: 0.3 mg/dL (ref 0.0–1.2)
CO2: 26 mmol/L (ref 20–29)
Calcium: 10.3 mg/dL — ABNORMAL HIGH (ref 8.7–10.2)
Chloride: 104 mmol/L (ref 96–106)
Creatinine, Ser: 0.8 mg/dL (ref 0.57–1.00)
GFR calc Af Amer: 96 mL/min/{1.73_m2} (ref >59)
GFR calc non Af Amer: 83 mL/min/{1.73_m2} (ref >59)
Globulin, Total: 2.7 g/dL (ref 1.5–4.5)
Glucose: 73 mg/dL (ref 65–99)
Potassium: 4.6 mmol/L (ref 3.5–5.2)
Sodium: 140 mmol/L (ref 134–144)
Total Protein: 6.9 g/dL (ref 6.0–8.5)

## 2020-11-11 LAB — TSH: TSH: 2.94 u[IU]/mL (ref 0.450–4.500)

## 2020-11-11 LAB — VITAMIN D 25 HYDROXY (VIT D DEFICIENCY, FRACTURES): Vit D, 25-Hydroxy: 53.6 ng/mL (ref 30.0–100.0)

## 2020-11-11 LAB — LIPID PANEL WITH LDL/HDL RATIO
Cholesterol, Total: 197 mg/dL (ref 100–199)
HDL: 53 mg/dL (ref >39)
LDL Chol Calc (NIH): 135 mg/dL — ABNORMAL HIGH (ref 0–99)
LDL/HDL Ratio: 2.5 ratio (ref 0.0–3.2)
Triglycerides: 50 mg/dL (ref 0–149)
VLDL Cholesterol Cal: 9 mg/dL (ref 5–40)

## 2020-11-11 LAB — HEMOGLOBIN A1C
Est. average glucose Bld gHb Est-mCnc: 97 mg/dL
Hgb A1c MFr Bld: 5 % (ref 4.8–5.6)

## 2020-11-11 LAB — INSULIN, RANDOM: INSULIN: 11.8 u[IU]/mL (ref 2.6–24.9)

## 2020-12-01 ENCOUNTER — Other Ambulatory Visit: Payer: Self-pay

## 2020-12-01 ENCOUNTER — Ambulatory Visit (INDEPENDENT_AMBULATORY_CARE_PROVIDER_SITE_OTHER): Payer: BC Managed Care – PPO | Admitting: Family Medicine

## 2020-12-01 ENCOUNTER — Encounter (INDEPENDENT_AMBULATORY_CARE_PROVIDER_SITE_OTHER): Payer: Self-pay | Admitting: Family Medicine

## 2020-12-01 VITALS — BP 99/67 | HR 79 | Temp 98.4°F | Ht 66.0 in | Wt 198.0 lb

## 2020-12-01 DIAGNOSIS — E7849 Other hyperlipidemia: Secondary | ICD-10-CM | POA: Diagnosis not present

## 2020-12-01 DIAGNOSIS — E669 Obesity, unspecified: Secondary | ICD-10-CM

## 2020-12-01 DIAGNOSIS — F3289 Other specified depressive episodes: Secondary | ICD-10-CM

## 2020-12-01 DIAGNOSIS — Z6832 Body mass index (BMI) 32.0-32.9, adult: Secondary | ICD-10-CM

## 2020-12-01 NOTE — Progress Notes (Signed)
The 10-year ASCVD risk score Mikey Bussing DC Brooke Bonito., et al., 2013) is: 1.4%   Values used to calculate the score:     Age: 55 years     Sex: Female     Is Non-Hispanic African American: Yes     Diabetic: No     Tobacco smoker: No     Systolic Blood Pressure: 99 mmHg     Is BP treated: No     HDL Cholesterol: 53 mg/dL     Total Cholesterol: 197 mg/dL

## 2020-12-03 NOTE — Progress Notes (Signed)
Chief Complaint:   OBESITY Alisha Chen is here to discuss her progress with her obesity treatment plan along with follow-up of her obesity related diagnoses. Alisha Chen is on keeping a food journal and adhering to recommended goals of 1150-1250 calories and 80+ grams of protein and states she is following her eating plan approximately 60% of the time. Alisha Chen states she is not exercising.  Today's visit was #: 6 Starting weight: 201 lbs Starting date: 12/10/2018 Today's weight: 198 lbs Today's date: 12/01/2020 Total lbs lost to date: 3 lbs Total lbs lost since last in-office visit: (+1)  Interim History: Alisha Chen is up 1 lb today. She is journaling daily but not eating enough protein. She sometimes goes over on calories. She notes cravings at night between 8-10 pm. Alisha Chen has been on Saxenda in the past, but stopped because of hemorrhoids. She tends to snack on simple carbs (potato chips).  Subjective:   1. Other hyperlipidemia Alisha Chen has hyperlipidemia and has been trying to improve her cholesterol levels with intensive lifestyle modification including a low saturated fat diet, exercise and weight loss. She denies any chest pain, claudication or myalgias. LDL up to 135 from 118 (05/27/20). ASCVD risk score is 1.4%. She is not on statin.  Lab Results  Component Value Date   ALT 18 11/10/2020   AST 16 11/10/2020   ALKPHOS 108 11/10/2020   BILITOT 0.3 11/10/2020   Lab Results  Component Value Date   CHOL 197 11/10/2020   HDL 53 11/10/2020   LDLCALC 135 (H) 11/10/2020   TRIG 50 11/10/2020   CHOLHDL 4.0 10/21/2019  The 10-year ASCVD risk score Alisha Bussing DC Jr., Alisha al., Alisha Chen) is: 1.4%   Values used to calculate the score:     Age: 55 years     Sex: Female     Is Non-Hispanic African American: Yes     Diabetic: No     Tobacco smoker: No     Systolic Blood Pressure: 99 mmHg     Is BP treated: No     HDL Cholesterol: 53 mg/dL     Total Cholesterol: 197 mg/dL   2. Other depression, with  emotional eating Alisha Chen notes significant cravings at night (8-10 pm). Discussed bupropion and Contrave.  3. High calcium levels Calcium is slightly elevated at 10.3. Has been slightly elevated since 12/10/18 (10.5),  11.1 on 10/21/19. It has slowly trended toward normal rather than continuing to rise. Discussed with Alisha Chen.   Assessment/Plan:   1. Other hyperlipidemia  Alisha Chen will continue with weight loss and exercise.  Counseling Intensive lifestyle modifications are the first line treatment for this issue. . Dietary changes: Increase soluble fiber. Decrease simple carbohydrates. . Exercise changes: Moderate to vigorous-intensity aerobic activity 150 minutes per week if tolerated. . Lipid-lowering medications: see documented in medical record.  2. Other depression, with emotional eating Behavior modification techniques were discussed today to help Alisha Chen deal with her emotional/non-hunger eating behaviors.  Orders and follow up as documented in patient record.  Alisha Chen will consider Contrave and bupropion.  3. High calcium levels Continue to monitor (per Alisha Chen).  4. Class 1 obesity with serious comorbidity and body mass index (BMI) of 32.0 to 32.9 in adult, unspecified obesity type  Alisha Chen is currently in the action stage of change. As such, her goal is to continue with weight loss efforts. She has agreed to keeping a food journal and adhering to recommended goals of 1150-1250  calories and 80 grams of protein daily.Marland Kitchen  Exercise goals: Neesha will restart exercise regimen.  Behavioral modification strategies: decreasing simple carbohydrates.  Emiya has agreed to follow-up with our clinic in 3 weeks.   Objective:   Blood pressure 99/67, pulse 79, temperature 98.4 F (36.9 C), height 5\' 6"  (1.676 m), weight 198 lb (89.8 kg), last menstrual period 04/08/Alisha Chen, SpO2 99 %. Body mass index is 31.96 kg/m.  General: Cooperative, alert, well developed, in no acute  distress. HEENT: Conjunctivae and lids unremarkable. Cardiovascular: Regular rhythm.  Lungs: Normal work of breathing. Neurologic: No focal deficits.   Lab Results  Component Value Date   CREATININE 0.80 11/10/2020   BUN 18 11/10/2020   NA 140 11/10/2020   K 4.6 11/10/2020   CL 104 11/10/2020   CO2 26 11/10/2020   Lab Results  Component Value Date   ALT 18 11/10/2020   AST 16 11/10/2020   ALKPHOS 108 11/10/2020   BILITOT 0.3 11/10/2020   Lab Results  Component Value Date   HGBA1C 5.0 11/10/2020   HGBA1C 5.0 05/27/2020   HGBA1C 4.8 10/21/2019   HGBA1C 4.9 12/10/2018   Lab Results  Component Value Date   INSULIN 11.8 11/10/2020   INSULIN 13.5 05/27/2020   INSULIN 16.6 12/10/2018   Lab Results  Component Value Date   TSH 2.940 11/10/2020   Lab Results  Component Value Date   CHOL 197 11/10/2020   HDL 53 11/10/2020   LDLCALC 135 (H) 11/10/2020   TRIG 50 11/10/2020   CHOLHDL 4.0 10/21/2019   Lab Results  Component Value Date   WBC 4.3 05/27/2020   HGB 12.2 05/27/2020   HCT 38.8 05/27/2020   MCV 89 05/27/2020   PLT 227 05/27/2020   No results found for: IRON, TIBC, FERRITIN   Attestation Statements:   Reviewed by clinician on day of visit: allergies, medications, problem list, medical history, surgical history, family history, social history, and previous encounter notes.  Alisha Chen, am acting as Location manager for Alisha Fick, FNP.  I have reviewed the above documentation for accuracy and completeness, and I agree with the above. -  Alisha Fick, FNP

## 2020-12-06 ENCOUNTER — Encounter (INDEPENDENT_AMBULATORY_CARE_PROVIDER_SITE_OTHER): Payer: Self-pay | Admitting: Family Medicine

## 2020-12-08 ENCOUNTER — Ambulatory Visit: Payer: BC Managed Care – PPO

## 2020-12-09 ENCOUNTER — Inpatient Hospital Stay: Payer: BC Managed Care – PPO | Admitting: Adult Health

## 2020-12-11 ENCOUNTER — Other Ambulatory Visit: Payer: Self-pay

## 2020-12-11 ENCOUNTER — Encounter: Payer: Self-pay | Admitting: Adult Health

## 2020-12-11 ENCOUNTER — Inpatient Hospital Stay: Payer: BC Managed Care – PPO | Attending: Adult Health | Admitting: Adult Health

## 2020-12-11 VITALS — BP 113/65 | HR 73 | Temp 97.5°F | Resp 18 | Ht 66.0 in | Wt 193.2 lb

## 2020-12-11 DIAGNOSIS — Z79810 Long term (current) use of selective estrogen receptor modulators (SERMs): Secondary | ICD-10-CM | POA: Insufficient documentation

## 2020-12-11 DIAGNOSIS — Z87440 Personal history of urinary (tract) infections: Secondary | ICD-10-CM | POA: Insufficient documentation

## 2020-12-11 DIAGNOSIS — Z836 Family history of other diseases of the respiratory system: Secondary | ICD-10-CM | POA: Diagnosis not present

## 2020-12-11 DIAGNOSIS — Z8349 Family history of other endocrine, nutritional and metabolic diseases: Secondary | ICD-10-CM | POA: Diagnosis not present

## 2020-12-11 DIAGNOSIS — C50412 Malignant neoplasm of upper-outer quadrant of left female breast: Secondary | ICD-10-CM | POA: Diagnosis not present

## 2020-12-11 DIAGNOSIS — Z170 Estrogen receptor positive status [ER+]: Secondary | ICD-10-CM | POA: Insufficient documentation

## 2020-12-11 DIAGNOSIS — E785 Hyperlipidemia, unspecified: Secondary | ICD-10-CM | POA: Diagnosis not present

## 2020-12-11 DIAGNOSIS — Z79899 Other long term (current) drug therapy: Secondary | ICD-10-CM | POA: Diagnosis not present

## 2020-12-11 DIAGNOSIS — Z820 Family history of epilepsy and other diseases of the nervous system: Secondary | ICD-10-CM | POA: Insufficient documentation

## 2020-12-11 DIAGNOSIS — D0512 Intraductal carcinoma in situ of left breast: Secondary | ICD-10-CM | POA: Diagnosis not present

## 2020-12-11 DIAGNOSIS — Z818 Family history of other mental and behavioral disorders: Secondary | ICD-10-CM | POA: Insufficient documentation

## 2020-12-11 DIAGNOSIS — Z880 Allergy status to penicillin: Secondary | ICD-10-CM | POA: Diagnosis not present

## 2020-12-11 DIAGNOSIS — Z833 Family history of diabetes mellitus: Secondary | ICD-10-CM | POA: Insufficient documentation

## 2020-12-11 NOTE — Progress Notes (Signed)
CLINIC:  Survivorship   REASON FOR VISIT:  Routine follow-up for history of breast cancer.   BRIEF ONCOLOGIC HISTORY:  Oncology History  Primary cancer of upper outer quadrant of left female breast ([place])  03/12/2012 Surgery   left breast lumpectomy that revealed 2 foci of invasive ductal carcinoma measuring 1.3 cm (ER87%,PR 100% Ki 40%)and 0.8 cm (Er96%, PR 99% Ki 74%)grade 2 with adjacent high-grade ductal carcinoma in situ with no evidence of angiolymphatic invasion.1/22 LN   03/23/2012 - 05/10/2012 Chemotherapy   Adjuvant chemo FEC 100 foll by Taxol X 12   07/10/2012 - 08/23/2012 Radiation Therapy   Adj XRT   09/14/2012 - 10/05/2012 Anti-estrogen oral therapy   Tamoxifen 20 mg 3 weeks stopped because of intolerance      INTERVAL HISTORY:  Alisha Chen presents to the [facility] today for routine follow-up for her history of breast cancer.  Overall, she reports feeling quite well. She is up to date with PCP visits.  She is due for colonoscopy in 2025, and is two months late for her pap smear.    She has her mammogram scheduled in 01/2021.  Her mammo before that was in 2020.  She notes that she exercises some with walking.     She sees her PCP regular REVIEW OF SYSTEMS:  Review of Systems  Constitutional: Negative for appetite change, chills, fatigue, fever and unexpected weight change.  HENT:   Negative for hearing loss, lump/mass, mouth sores and trouble swallowing.   Eyes: Negative for eye problems and icterus.  Respiratory: Negative for chest tightness, cough and shortness of breath.   Cardiovascular: Negative for chest pain, leg swelling and palpitations.  Gastrointestinal: Negative for abdominal distention, abdominal pain, constipation, diarrhea, nausea and vomiting.  Endocrine: Negative for hot flashes.  Genitourinary: Negative for difficulty urinating.   Musculoskeletal: Negative for arthralgias.  Skin: Negative for itching and rash.  Neurological: Negative  for dizziness, headaches and numbness.  Hematological: Negative for adenopathy. Does not bruise/bleed easily.  Psychiatric/Behavioral: Negative for depression. The patient is not nervous/anxious.    Breast: Denies any new nodularity, masses, tenderness, nipple changes, or nipple discharge.       PAST MEDICAL/SURGICAL HISTORY:  Past Medical History:  Diagnosis Date  . Anemia   . Back pain   . Breast cancer ([place])    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,dcis  . History of chemotherapy 03/23/12- 05/10/12   s/p 4 cycles of FEC   . Hx: UTI (urinary tract infection)   . Knee pain   . Lactose intolerance   . Neck pain   . S/P radiation therapy 07/10/12-08/23/12   Left Breast: 50 Gy/25 Fractions; Boost: 10 Gy/5 Fractions  . Status post chemotherapy 1 dose 05/23/12   Stopped after one cycle of Taxol due to Grade 2 Toxicity  . Status post chemotherapy 1 dose 05/23/12   Stopped after one cycle of Taxol due to Grade 2 Toxicity   Past Surgical History:  Procedure Laterality Date  . AXILLARY SURGERY  03/06/12   left, regional resection, 1/3 nodes pos  . BACK SURGERY     LUMBAR DECOMPRESSION  . BREAST BIOPSY  01/09/2012   left breast 3 0'clock/ER?PR =positive,her 2 neg  . BREAST LUMPECTOMY Left 02/10/12   Left Breast: 2 Foci of Invasive Ductal Caand High grade Ductal Carcinoma In Situ, 0/14 nodes Left Axilla Negative : Regional Resection of Lymph Nodes: 0/5 Nodes Negative  . FOOT SURGERY     multiple  .  INTRAUTERINE DEVICE REMOVAL  01/24/12  . needle core Biospy  01/09/12   Left Breast: Invasive Ductal Carcinoma, Lymph Node Axilla: Metastatic Mammary Carcinoma  . PORTACATH PLACEMENT  02/10/2012   Procedure: INSERTION PORT-A-CATH;  Surgeon: Rolm Bookbinder, MD;  Location: [facility];  Service: General;  Laterality: Right;  . Regional Resection  03/06/12   Left Axilla: 1/3 Nodes Metastatic Carcinoma  . SPINE SURGERY  01/2011     ALLERGIES:  Allergies  Allergen  Reactions  . Penicillins Other (See Comments)    Not sure, but was told by mother never to take this medication as a child  . Shellfish-Derived Products Other (See Comments)    Welts  . Clarithromycin Other (See Comments)    REACTION: NAUSEA,WEAK,BITTER TASTE  . Ondansetron Nausea And Vomiting    Headaches     CURRENT MEDICATIONS:  Outpatient Encounter Medications as of 12/11/2020  Medication Sig  . b complex vitamins tablet Take 1 tablet by mouth daily.  . cholecalciferol (VITAMIN D) 1000 UNITS tablet Take 1,000 Units by mouth daily.  Marland Kitchen levothyroxine (SYNTHROID) 50 MCG tablet TAKE 1 TABLET BY MOUTH EVERY DAY BEFORE BREAKFAST   No facility-administered encounter medications on file as of 12/11/2020.     ONCOLOGIC FAMILY HISTORY:  Family History  Problem Relation Age of Onset  . Diabetes Mother   . Sudden death Mother   . Schizophrenia Mother   . Sleep apnea Mother   . Obesity Mother   . Seizures Son     GENETIC COUNSELING/TESTING: Not at this time  SOCIAL HISTORY:  Social History   Socioeconomic History  . Marital status: Married    Spouse name: Louie Casa  . Number of children: 1  . Years of education: Not on file  . Highest education level: Not on file  Occupational History  . Occupation: Pharmacist, hospital, Art gallery manager    Employer: [employer]    Comment: [employer] scientific officer: [place]  Tobacco Use  . Smoking status: Never Smoker  . Smokeless tobacco: Never Used  Vaping Use  . Vaping Use: Never used  Substance and Sexual Activity  . Alcohol use: No  . Drug use: No  . Sexual activity: Yes    Partners: Male    Birth control/protection: None    Comment: menarche age 81,premenopausa; G!P1,1st pregnancy age 61  Other Topics Concern  . Not on file  Social History Narrative   Married x 21 years. Production assistant, radio at [employer]. 100 ten year old son   Social Determinants of Radio broadcast assistant Strain: Not on file  Food Insecurity: Not on  file  Transportation Needs: Not on file  Physical Activity: Not on file  Stress: Not on file  Social Connections: Not on file  Intimate Partner Violence: Not on file      PHYSICAL EXAMINATION:  Vital Signs: Vitals:   12/11/20 1300  BP: 113/65  Pulse: 73  Resp: 18  Temp: (!) 97.5 F (36.4 C)  SpO2: 99%   Filed Weights   12/11/20 1300  Weight: 193 lb 3.2 oz (87.6 kg)   General: Well-nourished, well-appearing female in no acute distress.  Unaccompanied today.   HEENT: Head is normocephalic.  Pupils equal and reactive to light. Conjunctivae clear without exudate.  Sclerae anicteric. Oral mucosa is pink, moist.  Oropharynx is pink without lesions or erythema.  Lymph: No cervical, supraclavicular, or infraclavicular lymphadenopathy noted on palpation.  Cardiovascular: Regular rate and rhythm.Marland Kitchen Respiratory: Clear to  auscultation bilaterally. Chest expansion symmetric; breathing non-labored.  Breast Exam:  -Left breast: No appreciable masses on palpation. No skin redness, thickening, or peau d'orange appearance; no nipple retraction or nipple discharge; mild distortion in symmetry at previous lumpectomy site well healed scar without erythema or nodularity.  -Right breast: No appreciable masses on palpation. No skin redness, thickening, or peau d'orange appearance; no nipple retraction or nipple discharge. -Axilla: No axillary adenopathy bilaterally.  GI: Abdomen soft and round; non-tender, non-distended. Bowel sounds normoactive. No hepatosplenomegaly.   GU: Deferred.  Neuro: No focal deficits. Steady gait.  Psych: Mood and affect normal and appropriate for situation.  MSK: No focal spinal tenderness to palpation, full range of motion in bilateral upper extremities Extremities: No edema. Skin: Warm and dry.  LABORATORY DATA:  None for this visit   DIAGNOSTIC IMAGING:  Most recent mammogram: scheduled 01/2021    ASSESSMENT AND PLAN:  Alisha Chen is a pleasant 55 y.o. female  with history of Stage IIA left breast invasive ductal carcinoma, ER+/PR+/HER2-, diagnosed in 2013, treated with lumpectomy/ALND, adjuvant chemotherapy, adjuvant radiation therapy, and anti-estrogen therapy with Tamoxifen, stopped after 3 years due to adverse effects.  She [facility] for surveillance and routine follow-up.   1. History of breast cancer:  Alisha Chen is currently clinically and radiographically without evidence of disease or recurrence of breast cancer. She is overdue for mammogram and that is scheduled in 01/2021. She would like to return in one year for continued LTS follow up, which we are happy to accommodate.  I encouraged her to call me with any questions or concerns before her next visit at the cancer center, and I would be happy to see her sooner, if needed.    2.  Bone health:  Given Alisha Chen's history of breast cancer, she is at slight risk for bone demineralization. She completed 3 years on tamoxifen which has a protective effect on the bones.  She was given education on specific food and activities to promote bone health.  3. Cancer screening:  Due to Alisha Chen's history and her age, she should receive screening for skin cancers, colon cancer, and gynecologic cancers. She was encouraged to follow-up with her PCP for appropriate cancer screenings.   4. Health maintenance and wellness promotion: Alisha Chen was encouraged to consume 5-7 servings of fruits and vegetables per day. She was also encouraged to engage in moderate to vigorous exercise for 30 minutes per day most days of the week. She was instructed to limit her alcohol consumption and continue to abstain from tobacco use.  She was recommended skin protection from direct sunlight/tanning beds.    Dispo:  -Return to cancer center in one year for LTS follow up -Mammogram 01/2021   Total encounter time: 20 minutes*  Wilber Bihari, NP 12/11/20 1:26 PM Medical Oncology and  Hematology [facility] [place], [place] [address] Tel. [phone]    Fax. [phone]  *Total Encounter Time as defined by the Centers for Medicare and Medicaid Services includes, in addition to the face-to-face time of a patient visit (documented in the note above) non-face-to-face time: obtaining and reviewing outside history, ordering and reviewing medications, tests or procedures, care coordination (communications with other health care professionals or caregivers) and documentation in the medical record.    Note: PRIMARY CARE PROVIDER [name], [place] [phone]

## 2020-12-14 ENCOUNTER — Telehealth: Payer: Self-pay | Admitting: Adult Health

## 2020-12-14 NOTE — Telephone Encounter (Signed)
Scheduled appts per 1/21 los. Pt confirmed appt date/time.

## 2020-12-22 ENCOUNTER — Ambulatory Visit (INDEPENDENT_AMBULATORY_CARE_PROVIDER_SITE_OTHER): Payer: BC Managed Care – PPO | Admitting: Family Medicine

## 2020-12-22 ENCOUNTER — Encounter (INDEPENDENT_AMBULATORY_CARE_PROVIDER_SITE_OTHER): Payer: Self-pay | Admitting: Family Medicine

## 2020-12-22 ENCOUNTER — Other Ambulatory Visit: Payer: Self-pay

## 2020-12-22 VITALS — BP 112/75 | HR 76 | Temp 97.8°F | Ht 66.0 in | Wt 198.0 lb

## 2020-12-22 DIAGNOSIS — E8881 Metabolic syndrome: Secondary | ICD-10-CM

## 2020-12-22 DIAGNOSIS — Z6831 Body mass index (BMI) 31.0-31.9, adult: Secondary | ICD-10-CM | POA: Diagnosis not present

## 2020-12-22 DIAGNOSIS — E669 Obesity, unspecified: Secondary | ICD-10-CM

## 2020-12-23 ENCOUNTER — Encounter (INDEPENDENT_AMBULATORY_CARE_PROVIDER_SITE_OTHER): Payer: Self-pay | Admitting: Family Medicine

## 2020-12-23 NOTE — Progress Notes (Signed)
Chief Complaint:   OBESITY Alisha Chen is here to discuss her progress with her obesity treatment plan along with follow-up of her obesity related diagnoses. Alisha Chen is on keeping a food journal and adhering to recommended goals of 1150-1250 calories and 80 grams of protein daily and states she is following her eating plan approximately 60% of the time. Alisha Chen states she is doing 0 minutes 0 times per week.  Today's visit was #: 14 Starting weight: 201 lbs Starting date: 12/10/2018 Today's weight: 198 lbs Today's date: 12/22/2020 Total lbs lost to date: 3 Total lbs lost since last in-office visit: 0  Interim History: Alisha Chen is not open to changing her eating plan right now. I suggested switching to the Low carbohydrate plan. She tends to overeat snacks such as chips. She is tracking consistently and tends to overeat calories when she meets her protein goals. She has not exercised much since November but she will start back today. She exercises at church with friends 3 times per week.  Subjective:   1. Insulin resistance Alisha Chen notices hunger within 3-4 hours after eating. She has dysgeusia with metformin. She continues to work on diet and exercise to decrease her risk of diabetes.  Lab Results  Component Value Date   INSULIN 11.8 11/10/2020   INSULIN 13.5 05/27/2020   INSULIN 16.6 12/10/2018   Lab Results  Component Value Date   HGBA1C 5.0 11/10/2020   Assessment/Plan:   1. Insulin resistance Alisha Chen will continue her meal plan, and will continue to work on weight loss, exercise, and decreasing simple carbohydrates to help decrease the risk of diabetes.   2. Class 1 obesity with serious comorbidity and body mass index (BMI) of 32.0 to 32.9 in adult, unspecified obesity type Alisha Chen is currently in the action stage of change. As such, her goal is to continue with weight loss efforts. She has agreed to keeping a food journal and adhering to recommended goals of 1000-1200 calories and 80 grams  of protein daily.   Exercise goals: Restart cardio and resistance 3 times per week.  Behavioral modification strategies: increasing lean protein intake, decreasing simple carbohydrates and better snacking choices.  Alisha Chen has agreed to follow-up with our clinic in 4 weeks.   Objective:   Blood pressure 112/75, pulse 76, temperature 97.8 F (36.6 C), temperature source Oral, height 5\' 6"  (1.676 m), weight 198 lb (89.8 kg), last menstrual period 02/27/2012, SpO2 96 %. Body mass index is 31.96 kg/m.  General: Cooperative, alert, well developed, in no acute distress. HEENT: Conjunctivae and lids unremarkable. Cardiovascular: Regular rhythm.  Lungs: Normal work of breathing. Neurologic: No focal deficits.   Lab Results  Component Value Date   CREATININE 0.80 11/10/2020   BUN 18 11/10/2020   NA 140 11/10/2020   K 4.6 11/10/2020   CL 104 11/10/2020   CO2 26 11/10/2020   Lab Results  Component Value Date   ALT 18 11/10/2020   AST 16 11/10/2020   ALKPHOS 108 11/10/2020   BILITOT 0.3 11/10/2020   Lab Results  Component Value Date   HGBA1C 5.0 11/10/2020   HGBA1C 5.0 05/27/2020   HGBA1C 4.8 10/21/2019   HGBA1C 4.9 12/10/2018   Lab Results  Component Value Date   INSULIN 11.8 11/10/2020   INSULIN 13.5 05/27/2020   INSULIN 16.6 12/10/2018   Lab Results  Component Value Date   TSH 2.940 11/10/2020   Lab Results  Component Value Date   CHOL 197 11/10/2020   HDL 53 11/10/2020  LDLCALC 135 (H) 11/10/2020   TRIG 50 11/10/2020   CHOLHDL 4.0 10/21/2019   Lab Results  Component Value Date   WBC 4.3 05/27/2020   HGB 12.2 05/27/2020   HCT 38.8 05/27/2020   MCV 89 05/27/2020   PLT 227 05/27/2020   No results found for: IRON, TIBC, FERRITIN  Attestation Statements:   Reviewed by clinician on day of visit: allergies, medications, problem list, medical history, surgical history, family history, social history, and previous encounter notes.   Wilhemena Durie, am  acting as Location manager for [employer], FNP-C.  I have reviewed the above documentation for accuracy and completeness, and I agree with the above. -  Georgianne Fick, FNP

## 2021-01-04 ENCOUNTER — Encounter: Payer: Self-pay | Admitting: Obstetrics and Gynecology

## 2021-01-04 ENCOUNTER — Other Ambulatory Visit: Payer: Self-pay

## 2021-01-04 ENCOUNTER — Ambulatory Visit (INDEPENDENT_AMBULATORY_CARE_PROVIDER_SITE_OTHER): Payer: BC Managed Care – PPO | Admitting: Obstetrics and Gynecology

## 2021-01-04 ENCOUNTER — Other Ambulatory Visit (HOSPITAL_COMMUNITY)
Admission: RE | Admit: 2021-01-04 | Discharge: 2021-01-04 | Disposition: A | Discharge status: Home / Self Care | Payer: BC Managed Care – PPO | Source: Ambulatory Visit | Attending: Obstetrics and Gynecology | Admitting: Obstetrics and Gynecology

## 2021-01-04 VITALS — BP 132/81 | HR 69 | Ht 67.0 in | Wt 199.0 lb

## 2021-01-04 DIAGNOSIS — Z01419 Encounter for gynecological examination (general) (routine) without abnormal findings: Secondary | ICD-10-CM | POA: Insufficient documentation

## 2021-01-04 NOTE — Progress Notes (Signed)
Pt is scheduled for mammo in March.

## 2021-01-04 NOTE — Progress Notes (Signed)
Subjective:     Alisha Chen is a 55 y.o. female postmenopausal with BMI 31 who is here for a comprehensive physical exam. The patient reports no problems. Patient is sexually active without complaints. She denies any episodes of postmenopausal vaginal bleeding. She denies any urinary incontinence. Patient is without any complaints  Past Medical History:  Diagnosis Date  . Anemia   . Back pain   . 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 History of chemotherapy 03/23/12- 05/10/12   s/p 4 cycles of FEC   . Hx: UTI (urinary tract infection)   . Knee pain   . Lactose intolerance   . Neck pain   . S/P radiation therapy 07/10/12-08/23/12   Left Breast: 50 Gy/25 Fractions; Boost: 10 Gy/5 Fractions  . Status post chemotherapy 1 dose 05/23/12   Stopped after one cycle of Taxol due to Grade 2 Toxicity  . Status post chemotherapy 1 dose 05/23/12   Stopped after one cycle of Taxol due to Grade 2 Toxicity   Past Surgical History:  Procedure Laterality Date  . AXILLARY SURGERY  03/06/12   left, regional resection, 1/3 nodes pos  . BACK SURGERY     LUMBAR DECOMPRESSION  . BREAST BIOPSY  01/09/2012   left breast 3 0'clock/ER?PR =positive,her 2 neg  . BREAST LUMPECTOMY Left 02/10/12   Left Breast: 2 Foci of Invasive Ductal Caand High grade Ductal Carcinoma In Situ, 0/14 nodes Left Axilla Negative : Regional Resection of Lymph Nodes: 0/5 Nodes Negative  . FOOT SURGERY     multiple  . INTRAUTERINE DEVICE REMOVAL  01/24/12  . needle core Biospy  01/09/12   Left Breast: Invasive Ductal Carcinoma, Lymph Node Axilla: Metastatic Mammary Carcinoma  . PORTACATH PLACEMENT  02/10/2012   Procedure: INSERTION PORT-A-CATH;  Surgeon: Rolm Bookbinder, MD;  Location: [facility];  Service: General;  Laterality: Right;  . Regional Resection  03/06/12   Left Axilla: 1/3 Nodes Metastatic Carcinoma  . SPINE SURGERY  01/2011   Family History  Problem Relation Age  of Onset  . Diabetes Mother   . Sudden death Mother   . Schizophrenia Mother   . Sleep apnea Mother   . Obesity Mother   . Seizures Son      Social History   Socioeconomic History  . Marital status: Married    Spouse name: Louie Casa  . Number of children: 1  . Years of education: Not on file  . Highest education level: Not on file  Occupational History  . Occupation: Pharmacist, hospital, Art gallery manager    Employer: [employer]    Comment: [employer] scientific officer: [employer]  Tobacco Use  . Smoking status: Never Smoker  . Smokeless tobacco: Never Used  Vaping Use  . Vaping Use: Never used  Substance and Sexual Activity  . Alcohol use: No  . Drug use: No  . Sexual activity: Yes    Partners: Male    Birth control/protection: None    Comment: menarche age 56,premenopausa; G!P1,1st pregnancy age 5  Other Topics Concern  . Not on file  Social History Narrative   Married x 21 years. Production assistant, radio at [employer]. 73 ten year old son   Social Determinants of Radio broadcast assistant Strain: Not on file  Food Insecurity: Not on file  Transportation Needs: Not on file  Physical Activity: Not on file  Stress: Not on file  Social Connections: Not  on file  Intimate Partner Violence: Not on file   Health Maintenance  Topic Date Due  . Hepatitis C Screening  Never done  . COVID-19 Vaccine (1) Never done  . HIV Screening  Never done  . INFLUENZA VACCINE  06/21/2020  . MAMMOGRAM  11/11/2020  . TETANUS/TDAP  09/19/2021  . PAP SMEAR-Modifier  10/06/2022  . COLONOSCOPY (Pts 45-11yrs Insurance coverage will need to be confirmed)  09/09/2024       Review of Systems Pertinent items noted in HPI and remainder of comprehensive ROS otherwise negative.   Objective:  Blood pressure 132/81, pulse 69, height 5\' 7"  (1.702 m), weight 199 lb (90.3 kg), last menstrual period 02/27/2012.     GENERAL: Well-developed, well-nourished female in no acute distress.  HEENT:  Normocephalic, atraumatic. Sclerae anicteric.  NECK: Supple. Normal thyroid.  LUNGS: Clear to auscultation bilaterally.  HEART: Regular rate and rhythm. BREASTS: Symmetric in size. No palpable masses or lymphadenopathy, skin changes, or nipple drainage. ABDOMEN: Soft, nontender, nondistended. No organomegaly. PELVIC: Normal external female genitalia. Vagina is pale and atrophic.  Normal discharge. Normal appearing cervix. Uterus is normal in size.  No adnexal mass or tenderness. EXTREMITIES: No cyanosis, clubbing, or edema, 2+ distal pulses.    Assessment:    Healthy female exam.      Plan:    pap smear collected Screening mammogram scheduled in March Patient with normal colonoscopy in 2015 See After Visit Summary for Counseling Recommendations

## 2021-01-08 LAB — CYTOLOGY - PAP: Diagnosis: NEGATIVE

## 2021-01-19 ENCOUNTER — Ambulatory Visit (INDEPENDENT_AMBULATORY_CARE_PROVIDER_SITE_OTHER): Payer: BC Managed Care – PPO | Admitting: Family Medicine

## 2021-01-19 ENCOUNTER — Ambulatory Visit: Payer: BC Managed Care – PPO

## 2021-01-19 ENCOUNTER — Other Ambulatory Visit: Payer: Self-pay

## 2021-01-19 ENCOUNTER — Encounter (INDEPENDENT_AMBULATORY_CARE_PROVIDER_SITE_OTHER): Payer: Self-pay | Admitting: Family Medicine

## 2021-01-19 VITALS — BP 116/73 | HR 63 | Temp 97.8°F | Ht 66.0 in | Wt 196.0 lb

## 2021-01-19 DIAGNOSIS — E8881 Metabolic syndrome: Secondary | ICD-10-CM | POA: Diagnosis not present

## 2021-01-19 DIAGNOSIS — E669 Obesity, unspecified: Secondary | ICD-10-CM

## 2021-01-19 DIAGNOSIS — Z6831 Body mass index (BMI) 31.0-31.9, adult: Secondary | ICD-10-CM

## 2021-01-20 ENCOUNTER — Encounter (INDEPENDENT_AMBULATORY_CARE_PROVIDER_SITE_OTHER): Payer: Self-pay | Admitting: Family Medicine

## 2021-01-20 NOTE — Progress Notes (Signed)
Chief Complaint:   OBESITY Alisha Chen is here to discuss her progress with her obesity treatment plan along with follow-up of her obesity related diagnoses. Alisha Chen is on keeping a food journal and adhering to recommended goals of 1000-1200 calories and 80 g protein and states she is following her eating plan approximately 50% of the time. Alisha Chen states she is walking and aerobics 30 minutes 3-5 times per week.  Today's visit was #: 30 Starting weight: 201 lbs Starting date: 12/10/2018 Today's weight: 196 lbs Today's date: 01/19/2021 Total lbs lost to date: 5 lbs Total lbs lost since last in-office visit: 2 lbs  Interim History: [name] notes that she is mostly staying around 1200 calories/day and protein is around 80 gms/day. She notes more hunger is afternoon and evening. She eats her first meal around noon. She has started exercising 3-5 times a week. She reports drinking kool-aid made with sugar.  Subjective:   1. Insulin resistance [name] notes hunger and cravings later in the day. She has been on Saxenda, which caused severe constipation and Metformin caused a metallic taste. She has not been on Topamax or Wellbutrin.   Assessment/Plan:   1. Insulin resistance  She declines meds for now.  2. Class 1 obesity with serious comorbidity and body mass index (BMI) of 31.0 to 31.9 in adult, unspecified obesity type Alisha Chen is currently in the action stage of change. As such, her goal is to continue with weight loss efforts. She has agreed to keeping a food journal and adhering to recommended goals of 1100-1200 calories and 80 g protein.   Exercise goals: As is  Behavioral modification strategies: increasing lean protein intake, decreasing simple carbohydrates and decreasing liquid calories.  Alisha Chen has agreed to follow-up with our clinic in 4 weeks.   Objective:   Blood pressure 116/73, pulse 63, temperature 97.8 F (36.6 C), temperature source Oral, height 5\' 6"  (1.676 m), weight 196 lb  (88.9 kg), last menstrual period 02/27/2012, SpO2 99 %. Body mass index is 31.64 kg/m.  General: Cooperative, alert, well developed, in no acute distress. HEENT: Conjunctivae and lids unremarkable. Cardiovascular: Regular rhythm.  Lungs: Normal work of breathing. Neurologic: No focal deficits.   Lab Results  Component Value Date   CREATININE 0.80 11/10/2020   BUN 18 11/10/2020   NA 140 11/10/2020   K 4.6 11/10/2020   CL 104 11/10/2020   CO2 26 11/10/2020   Lab Results  Component Value Date   ALT 18 11/10/2020   AST 16 11/10/2020   ALKPHOS 108 11/10/2020   BILITOT 0.3 11/10/2020   Lab Results  Component Value Date   HGBA1C 5.0 11/10/2020   HGBA1C 5.0 05/27/2020   HGBA1C 4.8 10/21/2019   HGBA1C 4.9 12/10/2018   Lab Results  Component Value Date   INSULIN 11.8 11/10/2020   INSULIN 13.5 05/27/2020   INSULIN 16.6 12/10/2018   Lab Results  Component Value Date   TSH 2.940 11/10/2020   Lab Results  Component Value Date   CHOL 197 11/10/2020   HDL 53 11/10/2020   LDLCALC 135 (H) 11/10/2020   TRIG 50 11/10/2020   CHOLHDL 4.0 10/21/2019   Lab Results  Component Value Date   WBC 4.3 05/27/2020   HGB 12.2 05/27/2020   HCT 38.8 05/27/2020   MCV 89 05/27/2020   PLT 227 05/27/2020    Attestation Statements:   Reviewed by clinician on day of visit: allergies, medications, problem list, medical history, surgical history, family history, social history, and  previous encounter notes.  Coral Ceo, am acting as Location manager for [employer], [place].  I have reviewed the above documentation for accuracy and completeness, and I agree with the above. -  Georgianne Fick, FNP

## 2021-01-23 ENCOUNTER — Ambulatory Visit
Admission: RE | Admit: 2021-01-23 | Discharge: 2021-01-23 | Disposition: A | Discharge status: Home / Self Care | Payer: BC Managed Care – PPO | Source: Ambulatory Visit | Attending: Hematology and Oncology | Admitting: Hematology and Oncology

## 2021-01-23 ENCOUNTER — Other Ambulatory Visit: Payer: Self-pay

## 2021-01-23 DIAGNOSIS — Z1231 Encounter for screening mammogram for malignant neoplasm of breast: Secondary | ICD-10-CM

## 2021-02-09 ENCOUNTER — Ambulatory Visit: Payer: BC Managed Care – PPO | Admitting: Family Medicine

## 2021-02-09 DIAGNOSIS — Z0289 Encounter for other administrative examinations: Secondary | ICD-10-CM

## 2021-02-11 ENCOUNTER — Other Ambulatory Visit: Payer: Self-pay

## 2021-02-11 ENCOUNTER — Encounter: Payer: Self-pay | Admitting: Family Medicine

## 2021-02-11 ENCOUNTER — Ambulatory Visit (INDEPENDENT_AMBULATORY_CARE_PROVIDER_SITE_OTHER): Payer: BC Managed Care – PPO | Admitting: Family Medicine

## 2021-02-11 VITALS — BP 110/68 | HR 85 | Temp 98.8°F | Resp 18 | Ht 66.0 in | Wt 203.6 lb

## 2021-02-11 DIAGNOSIS — H6523 Chronic serous otitis media, bilateral: Secondary | ICD-10-CM | POA: Diagnosis not present

## 2021-02-11 MED ORDER — LEVOCETIRIZINE DIHYDROCHLORIDE 5 MG PO TABS
5.0000 mg | ORAL_TABLET | Freq: Every evening | ORAL | 5 refills | Status: DC
Start: 2021-02-11 — End: 2022-08-10

## 2021-02-11 MED ORDER — FLUTICASONE PROPIONATE 50 MCG/ACT NA SUSP: 2.0000 | Freq: Every day | NASAL | 6 refills | Status: AC

## 2021-02-11 NOTE — Patient Instructions (Signed)
Otitis Media, Adult  Otitis media is a condition in which the middle ear is red and swollen (inflamed) and full of fluid. The middle ear is the part of the ear that contains bones for hearing as well as air that helps send sounds to the brain. The condition usually goes away on its own. What are the causes? This condition is caused by a blockage in the eustachian tube. The eustachian tube connects the middle ear to the back of the nose. It normally allows air into the middle ear. The blockage is caused by fluid or swelling. Problems that can cause blockage include:  A cold or infection that affects the nose, mouth, or throat.  Allergies.  An irritant, such as tobacco smoke.  Adenoids that have become large. The adenoids are soft tissue located in the back of the throat, behind the nose and the roof of the mouth.  Growth or swelling in the upper part of the throat, just behind the nose (nasopharynx).  Damage to the ear caused by change in pressure. This is called barotrauma. What are the signs or symptoms? Symptoms of this condition include:  Ear pain.  Fever.  Problems with hearing.  Being tired.  Fluid leaking from the ear.  Ringing in the ear. How is this treated? This condition can go away on its own within 3-5 days. But if the condition is caused by bacteria or does not go away on its own, or if it keeps coming back, your doctor may:  Give you antibiotic medicines.  Give you medicines for pain. Follow these instructions at home:  Take over-the-counter and prescription medicines only as told by your doctor.  If you were prescribed an antibiotic medicine, take it as told by your doctor. Do not stop taking the antibiotic even if you start to feel better.  Keep all follow-up visits as told by your doctor. This is important. Contact a doctor if:  You have bleeding from your nose.  There is a lump on your neck.  You are not feeling better in 5 days.  You feel worse  instead of better. Get help right away if:  You have pain that is not helped with medicine.  You have swelling, redness, or pain around your ear.  You get a stiff neck.  You cannot move part of your face (paralysis).  You notice that the bone behind your ear hurts when you touch it.  You get a very bad headache. Summary  Otitis media means that the middle ear is red, swollen, and full of fluid.  This condition usually goes away on its own.  If the problem does not go away, treatment may be needed. You may be given medicines to treat the infection or to treat your pain.  If you were prescribed an antibiotic medicine, take it as told by your doctor. Do not stop taking the antibiotic even if you start to feel better.  Keep all follow-up visits as told by your doctor. This is important. This information is not intended to replace advice given to you by your health care provider. Make sure you discuss any questions you have with your health care provider. Document Revised: 10/10/2019 Document Reviewed: 10/10/2019 Elsevier Patient Education  2021 Elsevier Inc.  

## 2021-02-11 NOTE — Progress Notes (Signed)
Patient ID: Alisha Chen, female    DOB: 1966/01/23  Age: 55 y.o. MRN: [id number]    Subjective:  Subjective  HPI Alisha Chen presents for b/l ear pain.   R >L pain.    Review of Systems  Constitutional: Negative for appetite change, diaphoresis, fatigue, fever and unexpected weight change.  HENT: Positive for ear pain. Negative for congestion.   Eyes: Negative for pain, redness and visual disturbance.  Respiratory: Negative for cough, chest tightness, shortness of breath and wheezing.   Cardiovascular: Negative for chest pain, palpitations and leg swelling.  Gastrointestinal: Negative for vomiting.  Endocrine: Negative for cold intolerance, heat intolerance, polydipsia, polyphagia and polyuria.  Genitourinary: Negative for difficulty urinating, dysuria and frequency.  Musculoskeletal: Negative for back pain.  Skin: Negative for rash.  Neurological: Negative for dizziness, light-headedness, numbness and headaches.    History Past Medical History:  Diagnosis Date  . Anemia   . Back pain   . 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 History of chemotherapy 03/23/12- 05/10/12   s/p 4 cycles of FEC   . Hx: UTI (urinary tract infection)   . Knee pain   . Lactose intolerance   . Neck pain   . S/P radiation therapy 07/10/12-08/23/12   Left Breast: 50 Gy/25 Fractions; Boost: 10 Gy/5 Fractions  . Status post chemotherapy 1 dose 05/23/12   Stopped after one cycle of Taxol due to Grade 2 Toxicity  . Status post chemotherapy 1 dose 05/23/12   Stopped after one cycle of Taxol due to Grade 2 Toxicity    Alisha Chen has a past surgical history that includes Spine surgery (01/2011); Foot surgery; Back surgery; Portacath placement (02/10/2012); needle core Biospy (01/09/12); Regional Resection (03/06/12); Intrauterine device removal (01/24/12); Axillary Surgery (03/06/12); Breast lumpectomy (Left, 02/10/12); and Breast biopsy (01/09/2012).   Her family  history includes Diabetes in her mother; Obesity in her mother; Schizophrenia in her mother; Seizures in her son; Sleep apnea in her mother; Sudden death in her mother.Alisha Chen reports that Alisha Chen has never smoked. Alisha Chen has never used smokeless tobacco. Alisha Chen reports that Alisha Chen does not drink alcohol and does not use drugs.  Current Outpatient Medications on File Prior to Visit  Medication Sig Dispense Refill  . b complex vitamins tablet Take 1 tablet by mouth daily.    . cholecalciferol (VITAMIN D) 1000 UNITS tablet Take 1,000 Units by mouth daily.    Marland Kitchen levothyroxine (SYNTHROID) 50 MCG tablet TAKE 1 TABLET BY MOUTH EVERY DAY BEFORE BREAKFAST 90 tablet 1   No current facility-administered medications on file prior to visit.     Objective:  Objective  Physical Exam Vitals and nursing note reviewed.  Constitutional:      Appearance: Alisha Chen is well-developed.  HENT:     Head: Normocephalic and atraumatic.     Right Ear: Decreased hearing noted. Tenderness present. No drainage or swelling. A middle ear effusion is present. There is no impacted cerumen. No foreign body. Tympanic membrane is not injected or scarred.     Left Ear: No decreased hearing noted. No tenderness. A middle ear effusion is present.     Nose: Nose normal.  Eyes:     Conjunctiva/sclera: Conjunctivae normal.  Neck:     Thyroid: No thyromegaly.     Vascular: No carotid bruit or JVD.  Cardiovascular:     Rate and Rhythm: Normal rate and regular rhythm.     Heart sounds: Normal  heart sounds. No murmur heard.   Pulmonary:     Effort: Pulmonary effort is normal. No respiratory distress.     Breath sounds: Normal breath sounds. No wheezing or rales.  Chest:     Chest wall: No tenderness.  Musculoskeletal:     Cervical back: Normal range of motion and neck supple.  Neurological:     Mental Status: Alisha Chen is alert and oriented to person, place, and time.    BP 110/68 (BP Location: Right Arm, Patient Position: Sitting, Cuff Size:  Normal)   Pulse 85   Temp 98.8 F (37.1 C) (Oral)   Resp 18   Ht 5\' 6"  (1.676 m)   Wt 203 lb 9.6 oz (92.4 kg)   LMP 02/27/2012   SpO2 97%   BMI 32.86 kg/m  Wt Readings from Last 3 Encounters:  02/11/21 203 lb 9.6 oz (92.4 kg)  01/19/21 196 lb (88.9 kg)  01/04/21 199 lb (90.3 kg)     Lab Results  Component Value Date   WBC 4.3 05/27/2020   HGB 12.2 05/27/2020   HCT 38.8 05/27/2020   PLT 227 05/27/2020   GLUCOSE 73 11/10/2020   CHOL 197 11/10/2020   TRIG 50 11/10/2020   HDL 53 11/10/2020   LDLCALC 135 (H) 11/10/2020   ALT 18 11/10/2020   AST 16 11/10/2020   NA 140 11/10/2020   K 4.6 11/10/2020   CL 104 11/10/2020   CREATININE 0.80 11/10/2020   BUN 18 11/10/2020   CO2 26 11/10/2020   TSH 2.940 11/10/2020   INR 1.04 02/10/2011   HGBA1C 5.0 11/10/2020    MM 3D SCREEN BREAST BILATERAL  Result Date: 01/26/2021 CLINICAL DATA:  Screening. EXAM: DIGITAL SCREENING BILATERAL MAMMOGRAM WITH TOMOSYNTHESIS AND CAD TECHNIQUE: Bilateral screening digital craniocaudal and mediolateral oblique mammograms were obtained. Bilateral screening digital breast tomosynthesis was performed. The images were evaluated with computer-aided detection. COMPARISON:  Previous exam(s). ACR Breast Density Category b: There are scattered areas of fibroglandular density. FINDINGS: There are no findings suspicious for malignancy. The images were evaluated with computer-aided detection. IMPRESSION: No mammographic evidence of malignancy. A result letter of this screening mammogram will be mailed directly to the patient. RECOMMENDATION: Screening mammogram in one year. (Code:SM-B-01Y) BI-RADS CATEGORY  1: Negative. Electronically Signed   By: Kristopher Oppenheim M.D.   On: 01/26/2021 14:28     Assessment & Plan:  Plan  I am having Juliette Standre. Buck start on fluticasone and levocetirizine. I am also having her maintain her cholecalciferol, b complex vitamins, and levothyroxine.  Meds ordered this encounter   Medications  . fluticasone (FLONASE) 50 MCG/ACT nasal spray    Sig: Place 2 sprays into both nostrils daily.    Dispense:  16 g    Refill:  6  . levocetirizine (XYZAL) 5 MG tablet    Sig: Take 1 tablet (5 mg total) by mouth every evening.    Dispense:  30 tablet    Refill:  5    Problem List Items Addressed This Visit   None   Visit Diagnoses    Bilateral chronic serous otitis media    -  Primary   Relevant Medications   fluticasone (FLONASE) 50 MCG/ACT nasal spray   levocetirizine (XYZAL) 5 MG tablet    use for 2-3 weeks if worsens or no improvement refer to ent  Follow-up: Return if symptoms worsen or fail to improve.  [name], DO

## 2021-02-16 ENCOUNTER — Ambulatory Visit (INDEPENDENT_AMBULATORY_CARE_PROVIDER_SITE_OTHER): Payer: BC Managed Care – PPO | Admitting: Family Medicine

## 2021-02-16 ENCOUNTER — Encounter (INDEPENDENT_AMBULATORY_CARE_PROVIDER_SITE_OTHER): Payer: Self-pay | Admitting: Family Medicine

## 2021-02-16 ENCOUNTER — Other Ambulatory Visit: Payer: Self-pay

## 2021-02-16 VITALS — BP 112/72 | HR 69 | Temp 98.1°F | Ht 66.0 in | Wt 196.0 lb

## 2021-02-16 DIAGNOSIS — E8881 Metabolic syndrome: Secondary | ICD-10-CM

## 2021-02-16 DIAGNOSIS — Z6832 Body mass index (BMI) 32.0-32.9, adult: Secondary | ICD-10-CM

## 2021-02-16 DIAGNOSIS — E669 Obesity, unspecified: Secondary | ICD-10-CM

## 2021-02-16 DIAGNOSIS — E88819 Insulin resistance, unspecified: Secondary | ICD-10-CM

## 2021-02-17 ENCOUNTER — Encounter (INDEPENDENT_AMBULATORY_CARE_PROVIDER_SITE_OTHER): Payer: Self-pay | Admitting: Family Medicine

## 2021-02-17 NOTE — Progress Notes (Signed)
Chief Complaint:   OBESITY Alisha Chen is here to discuss her progress with her obesity treatment plan along with follow-up of her obesity related diagnoses. Alisha Chen is on keeping a food journal and adhering to recommended goals of 1100-1200 calories and 80 grams of protein daily and states she is following her eating plan approximately 60% of the time. Alisha Chen states she is doing cardio for 30 minutes 3 times per week.  Today's visit was #: 61 Starting weight: 201 lbs Starting date: 12/10/2018 Today's weight: 196 lbs Today's date: 02/16/2021 Total lbs lost to date: 5 Total lbs lost since last in-office visit: 0  Interim History: Alisha Chen is exercising regularly. She still driks kool-aid with sugar occasionally . We reviewed her food journal: She averages 40-60 grams of protein daily. She tends to eat snack foods quite often and usually has only 2 meals per day. She is doing better with keeping calories below 1200.  She notes meal prep time is an issue.  Subjective:   1. Insulin resistance Tram feels her hunger is satisfied.   Lab Results  Component Value Date   INSULIN 11.8 11/10/2020   INSULIN 13.5 05/27/2020   INSULIN 16.6 12/10/2018   Lab Results  Component Value Date   HGBA1C 5.0 11/10/2020   Assessment/Plan:   1. Insulin resistance Vidalia will continue her meal plan, and will continue to work on weight loss, exercise, and decreasing simple carbohydrates such as snack foods.  2. Obesity: BMI 31 Alisha Chen is currently in the action stage of change. As such, her goal is to continue with weight loss efforts. She has agreed to keeping a food journal and adhering to recommended goals of 1100-1200 calories and 80 grams of protein daily.   I encouraged Alisha Chen to keep her fridge at work stocked with lunch meat and yogurt, and keep bread at work. This will prevent need to meal prep for lunch and hopefully reduce dining out.  Exercise goals: As is.  Behavioral modification strategies:  increasing lean protein intake and meal planning and cooking strategies.  Alisha Chen has agreed to follow-up with our clinic in 4 weeks.   Objective:   Blood pressure 112/72, pulse 69, temperature 98.1 F (36.7 C), weight 196 lb (88.9 kg), last menstrual period 02/27/2012, SpO2 95 %. Body mass index is 31.64 kg/m.  General: Cooperative, alert, well developed, in no acute distress. HEENT: Conjunctivae and lids unremarkable. Cardiovascular: Regular rhythm.  Lungs: Normal work of breathing. Neurologic: No focal deficits.   Lab Results  Component Value Date   CREATININE 0.80 11/10/2020   BUN 18 11/10/2020   NA 140 11/10/2020   K 4.6 11/10/2020   CL 104 11/10/2020   CO2 26 11/10/2020   Lab Results  Component Value Date   ALT 18 11/10/2020   AST 16 11/10/2020   ALKPHOS 108 11/10/2020   BILITOT 0.3 11/10/2020   Lab Results  Component Value Date   HGBA1C 5.0 11/10/2020   HGBA1C 5.0 05/27/2020   HGBA1C 4.8 10/21/2019   HGBA1C 4.9 12/10/2018   Lab Results  Component Value Date   INSULIN 11.8 11/10/2020   INSULIN 13.5 05/27/2020   INSULIN 16.6 12/10/2018   Lab Results  Component Value Date   TSH 2.940 11/10/2020   Lab Results  Component Value Date   CHOL 197 11/10/2020   HDL 53 11/10/2020   LDLCALC 135 (H) 11/10/2020   TRIG 50 11/10/2020   CHOLHDL 4.0 10/21/2019   Lab Results  Component Value Date  WBC 4.3 05/27/2020   HGB 12.2 05/27/2020   HCT 38.8 05/27/2020   MCV 89 05/27/2020   PLT 227 05/27/2020   No results found for: IRON, TIBC, FERRITIN  Attestation Statements:   Reviewed by clinician on day of visit: allergies, medications, problem list, medical history, surgical history, family history, social history, and previous encounter notes.   Wilhemena Durie, am acting as Location manager for [employer], FNP-C.  I have reviewed the above documentation for accuracy and completeness, and I agree with the above. -  Georgianne Fick, FNP

## 2021-03-15 ENCOUNTER — Ambulatory Visit (INDEPENDENT_AMBULATORY_CARE_PROVIDER_SITE_OTHER): Payer: BC Managed Care – PPO | Admitting: Family Medicine

## 2021-03-15 ENCOUNTER — Other Ambulatory Visit: Payer: Self-pay

## 2021-03-15 ENCOUNTER — Encounter (INDEPENDENT_AMBULATORY_CARE_PROVIDER_SITE_OTHER): Payer: Self-pay | Admitting: Family Medicine

## 2021-03-15 VITALS — BP 121/77 | HR 64 | Temp 98.1°F | Ht 66.0 in | Wt 196.0 lb

## 2021-03-15 DIAGNOSIS — E669 Obesity, unspecified: Secondary | ICD-10-CM

## 2021-03-15 DIAGNOSIS — Z6832 Body mass index (BMI) 32.0-32.9, adult: Secondary | ICD-10-CM | POA: Diagnosis not present

## 2021-03-15 DIAGNOSIS — E8881 Metabolic syndrome: Secondary | ICD-10-CM | POA: Diagnosis not present

## 2021-03-15 DIAGNOSIS — E88819 Insulin resistance, unspecified: Secondary | ICD-10-CM

## 2021-03-16 ENCOUNTER — Encounter (INDEPENDENT_AMBULATORY_CARE_PROVIDER_SITE_OTHER): Payer: Self-pay | Admitting: Family Medicine

## 2021-03-16 NOTE — Progress Notes (Signed)
Chief Complaint:   OBESITY Alisha Chen is here to discuss her progress with her obesity treatment plan along with follow-up of her obesity related diagnoses. Alisha Chen is on keeping a food journal and adhering to recommended goals of 1100-1200 calories and 80 grams protein and states she is following her eating plan approximately 60-70% of the time. Alisha Chen states she is walking 30 minutes 1 times per week.  Today's visit was #: 76 Starting weight: 201 lbs Starting date: 12/10/2018 Today's weight: 196 lbs Today's date: 03/15/2021 Total lbs lost to date: 5 Total lbs lost since last in-office visit: 0  Interim History: Alisha Chen is journaling consistently but usually does not meet protein goals if she meets calorie goals. She eats 2 meals per day, one usually being fast food based on review of her food diary. She notes increased hunger if she eats breakfast so she prefers to eat lunch and dinner. She denies polyphagia.  Subjective:   1. Insulin resistance Dianelly denies polyphagia. Her last fasting insulin level was elevated at 11.8.   Lab Results  Component Value Date   INSULIN 11.8 11/10/2020   INSULIN 13.5 05/27/2020   INSULIN 16.6 12/10/2018   Lab Results  Component Value Date   HGBA1C 5.0 11/10/2020    Assessment/Plan:   1. Insulin resistance  Continue meal plan.  2. Obesity: BMI 31 Alisha Chen is currently in the action stage of change. As such, her goal is to continue with weight loss efforts. She has agreed to keeping a food journal and adhering to recommended goals of 1100-1200 calories and 80 g protein.   Discussed strategies to reduce eating out at lunch.  Exercise goals: As is  Behavioral modification strategies: increasing lean protein intake, decreasing simple carbohydrates, decreasing eating out and meal planning and cooking strategies.  [name] has agreed to follow-up with our clinic in 4 weeks.  Objective:   Blood pressure 121/77, pulse 64, temperature 98.1 F (36.7 C),  height 5\' 6"  (1.676 m), weight 196 lb (88.9 kg), last menstrual period 02/27/2012, SpO2 99 %. Body mass index is 31.64 kg/m.  General: Cooperative, alert, well developed, in no acute distress. HEENT: Conjunctivae and lids unremarkable. Cardiovascular: Regular rhythm.  Lungs: Normal work of breathing. Neurologic: No focal deficits.   Lab Results  Component Value Date   CREATININE 0.80 11/10/2020   BUN 18 11/10/2020   NA 140 11/10/2020   K 4.6 11/10/2020   CL 104 11/10/2020   CO2 26 11/10/2020   Lab Results  Component Value Date   ALT 18 11/10/2020   AST 16 11/10/2020   ALKPHOS 108 11/10/2020   BILITOT 0.3 11/10/2020   Lab Results  Component Value Date   HGBA1C 5.0 11/10/2020   HGBA1C 5.0 05/27/2020   HGBA1C 4.8 10/21/2019   HGBA1C 4.9 12/10/2018   Lab Results  Component Value Date   INSULIN 11.8 11/10/2020   INSULIN 13.5 05/27/2020   INSULIN 16.6 12/10/2018   Lab Results  Component Value Date   TSH 2.940 11/10/2020   Lab Results  Component Value Date   CHOL 197 11/10/2020   HDL 53 11/10/2020   LDLCALC 135 (H) 11/10/2020   TRIG 50 11/10/2020   CHOLHDL 4.0 10/21/2019   Lab Results  Component Value Date   WBC 4.3 05/27/2020   HGB 12.2 05/27/2020   HCT 38.8 05/27/2020   MCV 89 05/27/2020   PLT 227 05/27/2020    Attestation Statements:   Reviewed by clinician on day of visit: allergies, medications,  problem list, medical history, surgical history, family history, social history, and previous encounter notes.  Coral Ceo, am acting as Location manager for [employer], [place].  I have reviewed the above documentation for accuracy and completeness, and I agree with the above. -  Georgianne Fick, FNP

## 2021-04-12 ENCOUNTER — Encounter: Payer: Self-pay | Admitting: Family Medicine

## 2021-04-12 ENCOUNTER — Ambulatory Visit (INDEPENDENT_AMBULATORY_CARE_PROVIDER_SITE_OTHER): Payer: BC Managed Care – PPO | Admitting: Family Medicine

## 2021-04-12 ENCOUNTER — Other Ambulatory Visit: Payer: Self-pay

## 2021-04-12 VITALS — BP 122/80 | HR 73 | Temp 99.2°F | Resp 18 | Ht 66.0 in | Wt 204.8 lb

## 2021-04-12 DIAGNOSIS — N390 Urinary tract infection, site not specified: Secondary | ICD-10-CM | POA: Diagnosis not present

## 2021-04-12 DIAGNOSIS — R300 Dysuria: Secondary | ICD-10-CM

## 2021-04-12 LAB — POCT URINALYSIS DIPSTICK
Bilirubin, UA: NEGATIVE
Glucose, UA: NEGATIVE
Ketones, UA: NEGATIVE
Nitrite, UA: POSITIVE
Protein, UA: POSITIVE — AB
Spec Grav, UA: 1.025 (ref 1.010–1.025)
Urobilinogen, UA: NEGATIVE E.U./dL — AB
pH, UA: 6 (ref 5.0–8.0)

## 2021-04-12 MED ORDER — NITROFURANTOIN MONOHYD MACRO 100 MG PO CAPS: 100.0000 mg | ORAL_CAPSULE | Freq: Two times a day (BID) | ORAL | 0 refills | Status: DC

## 2021-04-12 NOTE — Patient Instructions (Signed)
Urinary Tract Infection, Adult A urinary tract infection (UTI) is an infection of any part of the urinary tract. The urinary tract includes:  The kidneys.  The ureters.  The bladder.  The urethra. These organs make, store, and get rid of pee (urine) in the body. What are the causes? This infection is caused by germs (bacteria) in your genital area. These germs grow and cause swelling (inflammation) of your urinary tract. What increases the risk? The following factors may make you more likely to develop this condition:  Using a small, thin tube (catheter) to drain pee.  Not being able to control when you pee or poop (incontinence).  Being female. If you are female, these things can increase the risk: ? Using these methods to prevent pregnancy:  A medicine that kills sperm (spermicide).  A device that blocks sperm (diaphragm). ? Having low levels of a female hormone (estrogen). ? Being pregnant. You are more likely to develop this condition if:  You have genes that add to your risk.  You are sexually active.  You take antibiotic medicines.  You have trouble peeing because of: ? A prostate that is bigger than normal, if you are female. ? A blockage in the part of your body that drains pee from the bladder. ? A kidney stone. ? A nerve condition that affects your bladder. ? Not getting enough to drink. ? Not peeing often enough.  You have other conditions, such as: ? Diabetes. ? A weak disease-fighting system (immune system). ? Sickle cell disease. ? Gout. ? Injury of the spine. What are the signs or symptoms? Symptoms of this condition include:  Needing to pee right away.  Peeing small amounts often.  Pain or burning when peeing.  Blood in the pee.  Pee that smells bad or not like normal.  Trouble peeing.  Pee that is cloudy.  Fluid coming from the vagina, if you are female.  Pain in the belly or lower back. Other symptoms include:  Vomiting.  Not  feeling hungry.  Feeling mixed up (confused). This may be the first symptom in older adults.  Being tired and grouchy (irritable).  A fever.  Watery poop (diarrhea). How is this treated?  Taking antibiotic medicine.  Taking other medicines.  Drinking enough water. In some cases, you may need to see a specialist. Follow these instructions at home: Medicines  Take over-the-counter and prescription medicines only as told by your doctor.  If you were prescribed an antibiotic medicine, take it as told by your doctor. Do not stop taking it even if you start to feel better. General instructions  Make sure you: ? Pee until your bladder is empty. ? Do not hold pee for a long time. ? Empty your bladder after sex. ? Wipe from front to back after peeing or pooping if you are a female. Use each tissue one time when you wipe.  Drink enough fluid to keep your pee pale yellow.  Keep all follow-up visits.   Contact a doctor if:  You do not get better after 1-2 days.  Your symptoms go away and then come back. Get help right away if:  You have very bad back pain.  You have very bad pain in your lower belly.  You have a fever.  You have chills.  You feeling like you will vomit or you vomit. Summary  A urinary tract infection (UTI) is an infection of any part of the urinary tract.  This condition is caused by   germs in your genital area.  There are many risk factors for a UTI.  Treatment includes antibiotic medicines.  Drink enough fluid to keep your pee pale yellow. This information is not intended to replace advice given to you by your health care provider. Make sure you discuss any questions you have with your health care provider. Document Revised: 06/19/2020 Document Reviewed: 06/19/2020 Elsevier Patient Education  2021 Elsevier Inc.  

## 2021-04-12 NOTE — Progress Notes (Signed)
Patient ID: Alisha Chen, female    DOB: Mar 01, 1966  Age: 55 y.o. MRN: [id number]    Subjective:  Subjective  HPI Alisha Chen presents for an office visit. She complains of urinary frequency, pressure while urinating, and malodorous urine x1 week. She notes that she hasn't have any sexual intercourse recently. She denies any chest pain, SOB, fever, abdominal pain, cough, chills, sore throat, HA, or N/V/D at this time.   Review of Systems  Constitutional: Negative for chills, fatigue and fever.  HENT: Negative for ear pain, rhinorrhea, sinus pressure, sinus pain, sore throat and tinnitus.   Eyes: Negative for pain.  Respiratory: Negative for cough, shortness of breath and wheezing.   Cardiovascular: Negative for chest pain.  Gastrointestinal: Negative for abdominal pain, anal bleeding, constipation, diarrhea, nausea and vomiting.  Genitourinary: Positive for frequency.       (+) malodorous urine (+) pressure while urinating   Musculoskeletal: Negative for neck pain.  Skin: Negative for rash.  Neurological: Negative for seizures, weakness, light-headedness, numbness and headaches.    History Past Medical History:  Diagnosis Date  . Anemia   . Back pain   . 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 History of chemotherapy 03/23/12- 05/10/12   s/p 4 cycles of FEC   . Hx: UTI (urinary tract infection)   . Knee pain   . Lactose intolerance   . Neck pain   . S/P radiation therapy 07/10/12-08/23/12   Left Breast: 50 Gy/25 Fractions; Boost: 10 Gy/5 Fractions  . Status post chemotherapy 1 dose 05/23/12   Stopped after one cycle of Taxol due to Grade 2 Toxicity  . Status post chemotherapy 1 dose 05/23/12   Stopped after one cycle of Taxol due to Grade 2 Toxicity    She has a past surgical history that includes Spine surgery (01/2011); Foot surgery; Back surgery; Portacath placement (02/10/2012); needle core Biospy (01/09/12); Regional Resection  (03/06/12); Intrauterine device removal (01/24/12); Axillary Surgery (03/06/12); Breast lumpectomy (Left, 02/10/12); and Breast biopsy (01/09/2012).   Her family history includes Diabetes in her mother; Obesity in her mother; Schizophrenia in her mother; Seizures in her son; Sleep apnea in her mother; Sudden death in her mother.She reports that she has never smoked. She has never used smokeless tobacco. She reports that she does not drink alcohol and does not use drugs.  Current Outpatient Medications on File Prior to Visit  Medication Sig Dispense Refill  . b complex vitamins tablet Take 1 tablet by mouth daily.    . cholecalciferol (VITAMIN D) 1000 UNITS tablet Take 1,000 Units by mouth daily.    . fluticasone (FLONASE) 50 MCG/ACT nasal spray Place 2 sprays into both nostrils daily. 16 g 6  . levocetirizine (XYZAL) 5 MG tablet Take 1 tablet (5 mg total) by mouth every evening. 30 tablet 5  . levothyroxine (SYNTHROID) 50 MCG tablet TAKE 1 TABLET BY MOUTH EVERY DAY BEFORE BREAKFAST 90 tablet 1   No current facility-administered medications on file prior to visit.     Objective:  Objective  Physical Exam Vitals and nursing note reviewed.  Constitutional:      General: She is not in acute distress.    Appearance: Normal appearance. She is well-developed. She is not ill-appearing.  HENT:     Head: Normocephalic and atraumatic.     Right Ear: External ear normal.     Left Ear: External ear normal.     Nose: Nose normal.  Eyes:     General:        Right eye: No discharge.        Left eye: No discharge.     Extraocular Movements: Extraocular movements intact.     Pupils: Pupils are equal, round, and reactive to light.  Cardiovascular:     Rate and Rhythm: Normal rate and regular rhythm.     Pulses: Normal pulses.     Heart sounds: Normal heart sounds. No murmur heard. No friction rub. No gallop.   Pulmonary:     Effort: Pulmonary effort is normal. No respiratory distress.     Breath  sounds: Normal breath sounds. No stridor. No wheezing, rhonchi or rales.  Chest:     Chest wall: No tenderness.  Abdominal:     General: Bowel sounds are normal. There is no distension.     Palpations: Abdomen is soft. There is no mass.     Tenderness: There is no abdominal tenderness. There is no guarding or rebound.     Hernia: No hernia is present.  Musculoskeletal:        General: Normal range of motion.     Cervical back: Normal range of motion and neck supple.     Right lower leg: No edema.     Left lower leg: No edema.  Skin:    General: Skin is warm and dry.  Neurological:     Mental Status: She is alert and oriented to person, place, and time.  Psychiatric:        Behavior: Behavior normal.        Thought Content: Thought content normal.    BP 122/80 (BP Location: Right Arm, Patient Position: Sitting, Cuff Size: Large)   Pulse 73   Temp 99.2 F (37.3 C) (Oral)   Resp 18   Ht 5\' 6"  (1.676 m)   Wt 204 lb 12.8 oz (92.9 kg)   LMP 02/27/2012   SpO2 100%   BMI 33.06 kg/m  Wt Readings from Last 3 Encounters:  04/12/21 204 lb 12.8 oz (92.9 kg)  03/15/21 196 lb (88.9 kg)  02/16/21 196 lb (88.9 kg)     Lab Results  Component Value Date   WBC 4.3 05/27/2020   HGB 12.2 05/27/2020   HCT 38.8 05/27/2020   PLT 227 05/27/2020   GLUCOSE 73 11/10/2020   CHOL 197 11/10/2020   TRIG 50 11/10/2020   HDL 53 11/10/2020   LDLCALC 135 (H) 11/10/2020   ALT 18 11/10/2020   AST 16 11/10/2020   NA 140 11/10/2020   K 4.6 11/10/2020   CL 104 11/10/2020   CREATININE 0.80 11/10/2020   BUN 18 11/10/2020   CO2 26 11/10/2020   TSH 2.940 11/10/2020   INR 1.04 02/10/2011   HGBA1C 5.0 11/10/2020    MM 3D SCREEN BREAST BILATERAL  Result Date: 01/26/2021 CLINICAL DATA:  Screening. EXAM: DIGITAL SCREENING BILATERAL MAMMOGRAM WITH TOMOSYNTHESIS AND CAD TECHNIQUE: Bilateral screening digital craniocaudal and mediolateral oblique mammograms were obtained. Bilateral screening digital  breast tomosynthesis was performed. The images were evaluated with computer-aided detection. COMPARISON:  Previous exam(s). ACR Breast Density Category b: There are scattered areas of fibroglandular density. FINDINGS: There are no findings suspicious for malignancy. The images were evaluated with computer-aided detection. IMPRESSION: No mammographic evidence of malignancy. A result letter of this screening mammogram will be mailed directly to the patient. RECOMMENDATION: Screening mammogram in one year. (Code:SM-B-01Y) BI-RADS CATEGORY  1: Negative. Electronically Signed   By: Kristopher Oppenheim  M.D.   On: 01/26/2021 14:28     Assessment & Plan:  Plan    Meds ordered this encounter  Medications  . nitrofurantoin, macrocrystal-monohydrate, (MACROBID) 100 MG capsule    Sig: Take 1 capsule (100 mg total) by mouth 2 (two) times daily.    Dispense:  14 capsule    Refill:  0    Problem List Items Addressed This Visit      Unprioritized   UTI (urinary tract infection)   Relevant Medications   nitrofurantoin, macrocrystal-monohydrate, (MACROBID) 100 MG capsule   Other Relevant Orders   Urine Culture    Other Visit Diagnoses    Burning with urination    -  Primary   Relevant Orders   POCT Urinalysis Dipstick (Completed)   Urine Culture      Follow-up: Return if symptoms worsen or fail to improve.   I,Gordon Zheng,acting as a Education administrator for [employer], DO.,have documented all relevant documentation on the behalf of [name], DO,as directed by  [name], DO while in the presence of [place], DO, have reviewed all documentation for this visit. The documentation on 04/12/21 for the exam, diagnosis, procedures, and orders are all accurate and complete.

## 2021-04-14 ENCOUNTER — Encounter (INDEPENDENT_AMBULATORY_CARE_PROVIDER_SITE_OTHER): Payer: Self-pay | Admitting: Family Medicine

## 2021-04-14 ENCOUNTER — Ambulatory Visit (INDEPENDENT_AMBULATORY_CARE_PROVIDER_SITE_OTHER): Payer: BC Managed Care – PPO | Admitting: Family Medicine

## 2021-04-14 ENCOUNTER — Other Ambulatory Visit: Payer: Self-pay

## 2021-04-14 VITALS — BP 112/76 | HR 83 | Temp 98.4°F | Ht 66.0 in | Wt 197.0 lb

## 2021-04-14 DIAGNOSIS — E669 Obesity, unspecified: Secondary | ICD-10-CM

## 2021-04-14 DIAGNOSIS — Z6832 Body mass index (BMI) 32.0-32.9, adult: Secondary | ICD-10-CM

## 2021-04-14 DIAGNOSIS — E88819 Insulin resistance, unspecified: Secondary | ICD-10-CM

## 2021-04-14 DIAGNOSIS — E8881 Metabolic syndrome: Secondary | ICD-10-CM

## 2021-04-14 LAB — URINE CULTURE
MICRO NUMBER:: 11922283
SPECIMEN QUALITY:: ADEQUATE

## 2021-04-15 NOTE — Progress Notes (Signed)
Chief Complaint:   OBESITY Alisha Chen is here to discuss her progress with her obesity treatment plan along with follow-up of her obesity related diagnoses. Alisha Chen is on keeping a food journal and adhering to recommended goals of 1100-1200 calories and 80 grams of protein daily and states she is following her eating plan approximately 50-60% of the time. Alisha Chen states she is doing 0 minutes 0 times per week.  Today's visit was #: 53 Starting weight: 201 lbs Starting date: 12/10/2018 Today's weight: 197 lbs Today's date: 04/14/2021 Total lbs lost to date: 4 Total lbs lost since last in-office visit: 0  Interim History: Alisha Chen has recently has a upper respiratory infection, but she is recovering. She notes going over on her calories. She feels she does better if she comes in for a follow up every 2 weeks. She has been eating out less and making good choices when she does. She sometimes has 2 protein shakes and 1 meal per day.  Her initial weight was 201 lbs.   Subjective:   1. Insulin resistance Alisha Chen denies polyphagia and she is not on metformin (caused dysgeusia).   Lab Results  Component Value Date   INSULIN 11.8 11/10/2020   INSULIN 13.5 05/27/2020   INSULIN 16.6 12/10/2018   Lab Results  Component Value Date   HGBA1C 5.0 11/10/2020   Assessment/Plan:   1. Insulin resistance Alisha Chen will continue her meal plan.  2. Obesity: BMI 31.81 Alisha Chen is currently in the action stage of change. As such, her goal is to continue with weight loss efforts. She has agreed to keeping a food journal and adhering to recommended goals of 1100-1200 calories and 80 grams of protein daily.   Alisha Chen is to limit her protein shakes to 1 per day.  Exercise goals: She will start back exercising.  Behavioral modification strategies: increasing lean protein intake and meal planning and cooking strategies.  Alisha Chen has agreed to follow-up with our clinic in 4 weeks.   Objective:   Blood pressure 112/76,  pulse 83, temperature 98.4 F (36.9 C), temperature source Oral, height 5\' 6"  (1.676 m), weight 197 lb (89.4 kg), last menstrual period 02/27/2012, SpO2 97 %. Body mass index is 31.8 kg/m.  General: Cooperative, alert, well developed, in no acute distress. HEENT: Conjunctivae and lids unremarkable. Cardiovascular: Regular rhythm.  Lungs: Normal work of breathing. Neurologic: No focal deficits.   Lab Results  Component Value Date   CREATININE 0.80 11/10/2020   BUN 18 11/10/2020   NA 140 11/10/2020   K 4.6 11/10/2020   CL 104 11/10/2020   CO2 26 11/10/2020   Lab Results  Component Value Date   ALT 18 11/10/2020   AST 16 11/10/2020   ALKPHOS 108 11/10/2020   BILITOT 0.3 11/10/2020   Lab Results  Component Value Date   HGBA1C 5.0 11/10/2020   HGBA1C 5.0 05/27/2020   HGBA1C 4.8 10/21/2019   HGBA1C 4.9 12/10/2018   Lab Results  Component Value Date   INSULIN 11.8 11/10/2020   INSULIN 13.5 05/27/2020   INSULIN 16.6 12/10/2018   Lab Results  Component Value Date   TSH 2.940 11/10/2020   Lab Results  Component Value Date   CHOL 197 11/10/2020   HDL 53 11/10/2020   LDLCALC 135 (H) 11/10/2020   TRIG 50 11/10/2020   CHOLHDL 4.0 10/21/2019   Lab Results  Component Value Date   WBC 4.3 05/27/2020   HGB 12.2 05/27/2020   HCT 38.8 05/27/2020   MCV 89  05/27/2020   PLT 227 05/27/2020   No results found for: IRON, TIBC, FERRITIN  Attestation Statements:   Reviewed by clinician on day of visit: allergies, medications, problem list, medical history, surgical history, family history, social history, and previous encounter notes.   Wilhemena Durie, am acting as Location manager for [employer], FNP-C.  I have reviewed the above documentation for accuracy and completeness, and I agree with the above. -  Georgianne Fick, FNP

## 2021-04-16 ENCOUNTER — Encounter: Payer: Self-pay | Admitting: Family Medicine

## 2021-04-16 MED ORDER — CIPROFLOXACIN HCL 250 MG PO TABS: 250.0000 mg | ORAL_TABLET | Freq: Two times a day (BID) | ORAL | 0 refills | Status: AC

## 2021-04-20 ENCOUNTER — Encounter (INDEPENDENT_AMBULATORY_CARE_PROVIDER_SITE_OTHER): Payer: Self-pay | Admitting: Family Medicine

## 2021-05-12 ENCOUNTER — Other Ambulatory Visit: Payer: Self-pay

## 2021-05-12 ENCOUNTER — Encounter (INDEPENDENT_AMBULATORY_CARE_PROVIDER_SITE_OTHER): Payer: Self-pay | Admitting: Family Medicine

## 2021-05-12 ENCOUNTER — Ambulatory Visit (INDEPENDENT_AMBULATORY_CARE_PROVIDER_SITE_OTHER): Payer: BC Managed Care – PPO | Admitting: Family Medicine

## 2021-05-12 VITALS — BP 120/82 | HR 72 | Temp 98.3°F | Ht 66.0 in | Wt 197.0 lb

## 2021-05-12 DIAGNOSIS — E669 Obesity, unspecified: Secondary | ICD-10-CM

## 2021-05-12 DIAGNOSIS — E8881 Metabolic syndrome: Secondary | ICD-10-CM | POA: Diagnosis not present

## 2021-05-12 DIAGNOSIS — Z6832 Body mass index (BMI) 32.0-32.9, adult: Secondary | ICD-10-CM | POA: Diagnosis not present

## 2021-05-12 DIAGNOSIS — E88819 Insulin resistance, unspecified: Secondary | ICD-10-CM

## 2021-05-13 ENCOUNTER — Encounter (INDEPENDENT_AMBULATORY_CARE_PROVIDER_SITE_OTHER): Payer: Self-pay | Admitting: Family Medicine

## 2021-05-13 NOTE — Progress Notes (Signed)
Chief Complaint:   OBESITY Alisha Chen is here to discuss her progress with her obesity treatment plan along with follow-up of her obesity related diagnoses. Alisha Chen is on keeping a food journal and adhering to recommended goals of 1100-1200 calories and 80 g protein and states she is following her eating plan approximately 50% of the time. Alisha Chen states she is walking and using YouTube videos 30-45 minutes 1-3 times per week.  Today's visit was #: 40 Starting weight: 201 lbs Starting date: 12/10/2018 Today's weight: 197 lbs Today's date: 05/12/2021 Total lbs lost to date: 4 Total lbs lost since last in-office visit: 0  Interim History: Alisha Chen has been exercising more. She has also been eating out less. However, journaling is inconsistent and she tends to go over on calories and under on protein. Her weight has essentially been the same for the last 2 years (ranging mostly 196-197).   Subjective:   1. Insulin resistance Alisha Chen is not on Metformin. She denies polyphagia.  Lab Results  Component Value Date   INSULIN 11.8 11/10/2020   INSULIN 13.5 05/27/2020   INSULIN 16.6 12/10/2018   Lab Results  Component Value Date   HGBA1C 5.0 11/10/2020   Assessment/Plan:   1. Insulin resistance Alisha Chen will continue meal plan.  2. Obesity: BMI 31.81  Alisha Chen is currently in the action stage of change. As such, her goal is to continue with weight loss efforts. She has agreed to kfollowing a lower carbohydrate, vegetable and lean protein rich diet plan.   Handout: Low Carb Plan, Recipe: Egg Muffins  Exercise goals:  As is  Behavioral modification strategies: increasing lean protein intake, decreasing simple carbohydrates, and meal planning and cooking strategies.  Alisha Chen has agreed to follow-up with our clinic in 2 weeks.  Objective:   Blood pressure 120/82, pulse 72, temperature 98.3 F (36.8 C), height 5\' 6"  (1.676 m), weight 197 lb (89.4 kg), last menstrual period 02/27/2012, SpO2 97  %. Body mass index is 31.8 kg/m.  General: Cooperative, alert, well developed, in no acute distress. HEENT: Conjunctivae and lids unremarkable. Cardiovascular: Regular rhythm.  Lungs: Normal work of breathing. Neurologic: No focal deficits.   Lab Results  Component Value Date   CREATININE 0.80 11/10/2020   BUN 18 11/10/2020   NA 140 11/10/2020   K 4.6 11/10/2020   CL 104 11/10/2020   CO2 26 11/10/2020   Lab Results  Component Value Date   ALT 18 11/10/2020   AST 16 11/10/2020   ALKPHOS 108 11/10/2020   BILITOT 0.3 11/10/2020   Lab Results  Component Value Date   HGBA1C 5.0 11/10/2020   HGBA1C 5.0 05/27/2020   HGBA1C 4.8 10/21/2019   HGBA1C 4.9 12/10/2018   Lab Results  Component Value Date   INSULIN 11.8 11/10/2020   INSULIN 13.5 05/27/2020   INSULIN 16.6 12/10/2018   Lab Results  Component Value Date   TSH 2.940 11/10/2020   Lab Results  Component Value Date   CHOL 197 11/10/2020   HDL 53 11/10/2020   LDLCALC 135 (H) 11/10/2020   TRIG 50 11/10/2020   CHOLHDL 4.0 10/21/2019   Lab Results  Component Value Date   WBC 4.3 05/27/2020   HGB 12.2 05/27/2020   HCT 38.8 05/27/2020   MCV 89 05/27/2020   PLT 227 05/27/2020   No results found for: IRON, TIBC, FERRITIN   Attestation Statements:   Reviewed by clinician on day of visit: allergies, medications, problem list, medical history, surgical history, family history, social  history, and previous encounter notes.  Coral Ceo, CMA, am acting as Location manager for [employer], [place].  I have reviewed the above documentation for accuracy and completeness, and I agree with the above. -  Georgianne Fick, FNP

## 2021-05-13 NOTE — Telephone Encounter (Signed)
Please advise 

## 2021-05-26 ENCOUNTER — Encounter (INDEPENDENT_AMBULATORY_CARE_PROVIDER_SITE_OTHER): Payer: Self-pay | Admitting: Family Medicine

## 2021-05-26 ENCOUNTER — Telehealth (INDEPENDENT_AMBULATORY_CARE_PROVIDER_SITE_OTHER): Payer: BC Managed Care – PPO | Admitting: Family Medicine

## 2021-05-26 ENCOUNTER — Other Ambulatory Visit: Payer: Self-pay

## 2021-05-26 DIAGNOSIS — E669 Obesity, unspecified: Secondary | ICD-10-CM

## 2021-05-26 DIAGNOSIS — K5909 Other constipation: Secondary | ICD-10-CM

## 2021-05-26 DIAGNOSIS — Z6832 Body mass index (BMI) 32.0-32.9, adult: Secondary | ICD-10-CM | POA: Diagnosis not present

## 2021-05-31 NOTE — Progress Notes (Signed)
TeleHealth Visit:  Due to the COVID-19 pandemic, this visit was completed with telemedicine (audio/video) technology to reduce patient and provider exposure as well as to preserve personal protective equipment.   Alisha Chen has verbally consented to this TeleHealth visit. The patient is located at home, the provider is located at the [facility] office. The participants in this visit include the listed provider and patient. The visit was conducted today via video.   Chief Complaint: OBESITY Alisha Chen is here to discuss her progress with her obesity treatment plan along with follow-up of her obesity related diagnoses. Alisha Chen is on keeping a food journal and adhering to recommended goals of 1150-1250 calories and 80 g protein and following a lower carbohydrate, vegetable and lean protein rich diet plan and states she is following her eating plan approximately 80% of the time. Alisha Chen states she is doing water aerobics and YouTube videos 30-45 minutes 3-5 times per week.  Today's visit was #: 64 Starting weight: 201 lbs Starting date: 12/10/2018  Interim History: Alisha Chen says she is struggling with severe constipation. She is not feeling well. She did try to stick to the low carb plan. She has lost 3 lbs and is 194 lbs. Pt is journaling along with low carb plan and getting 80 grams protein and calories are averaging about 1150-1250. Her water intake is good. Vegetable intake could be better.  Subjective:   1. Other constipation Alisha Chen has had constipation since Monday (2 days ago). She was able to have a large BM last night with help of Fleets enema. She is having soft BMs today. She has had severe constipation in the past.   Assessment/Plan:   1. Other constipation Continue to monitor.  2. Obesity: BMI 31.81  Alisha Chen is currently in the action stage of change. As such, her goal is to continue with weight loss efforts. She has agreed to following a lower carbohydrate, vegetable and lean  protein rich diet plan.   Exercise goals:  As is  Behavioral modification strategies: increasing vegetables and increasing water intake.  Alisha Chen has agreed to follow-up with our clinic in 3 weeks.  Objective:   VITALS: Per patient if applicable, see vitals. GENERAL: Alert and in no acute distress. CARDIOPULMONARY: No increased WOB. Speaking in clear sentences.  PSYCH: Pleasant and cooperative. Speech normal rate and rhythm. Affect is appropriate. Insight and judgement are appropriate. Attention is focused, linear, and appropriate.  NEURO: Oriented as arrived to appointment on time with no prompting.   Lab Results  Component Value Date   CREATININE 0.80 11/10/2020   BUN 18 11/10/2020   NA 140 11/10/2020   K 4.6 11/10/2020   CL 104 11/10/2020   CO2 26 11/10/2020   Lab Results  Component Value Date   ALT 18 11/10/2020   AST 16 11/10/2020   ALKPHOS 108 11/10/2020   BILITOT 0.3 11/10/2020   Lab Results  Component Value Date   HGBA1C 5.0 11/10/2020   HGBA1C 5.0 05/27/2020   HGBA1C 4.8 10/21/2019   HGBA1C 4.9 12/10/2018   Lab Results  Component Value Date   INSULIN 11.8 11/10/2020   INSULIN 13.5 05/27/2020   INSULIN 16.6 12/10/2018   Lab Results  Component Value Date   TSH 2.940 11/10/2020   Lab Results  Component Value Date   CHOL 197 11/10/2020   HDL 53 11/10/2020   LDLCALC 135 (H) 11/10/2020   TRIG 50 11/10/2020   CHOLHDL 4.0 10/21/2019   Lab Results  Component Value Date  VD25OH 53.6 11/10/2020   VD25OH 54.1 05/27/2020   VD25OH 43.1 12/10/2018   Lab Results  Component Value Date   WBC 4.3 05/27/2020   HGB 12.2 05/27/2020   HCT 38.8 05/27/2020   MCV 89 05/27/2020   PLT 227 05/27/2020   No results found for: IRON, TIBC, FERRITIN  Attestation Statements:   Reviewed by clinician on day of visit: allergies, medications, problem list, medical history, surgical history, family history, social history, and previous encounter notes.  Coral Ceo,  CMA, am acting as Location manager for [employer], [place].  I have reviewed the above documentation for accuracy and completeness, and I agree with the above. - Georgianne Fick, FNP

## 2021-06-09 ENCOUNTER — Other Ambulatory Visit: Payer: Self-pay

## 2021-06-09 ENCOUNTER — Ambulatory Visit (INDEPENDENT_AMBULATORY_CARE_PROVIDER_SITE_OTHER): Payer: BC Managed Care – PPO | Admitting: Family Medicine

## 2021-06-09 ENCOUNTER — Encounter (INDEPENDENT_AMBULATORY_CARE_PROVIDER_SITE_OTHER): Payer: Self-pay | Admitting: Family Medicine

## 2021-06-09 VITALS — BP 99/64 | HR 74 | Temp 98.1°F | Ht 66.0 in | Wt 192.0 lb

## 2021-06-09 DIAGNOSIS — K5909 Other constipation: Secondary | ICD-10-CM

## 2021-06-09 DIAGNOSIS — E669 Obesity, unspecified: Secondary | ICD-10-CM

## 2021-06-09 DIAGNOSIS — Z6832 Body mass index (BMI) 32.0-32.9, adult: Secondary | ICD-10-CM

## 2021-06-14 NOTE — Progress Notes (Signed)
TeleHealth Visit:  Due to the COVID-19 pandemic, this visit was completed with telemedicine (audio/video) technology to reduce patient and provider exposure as well as to preserve personal protective equipment.   Jeniffer has verbally consented to this TeleHealth visit. The patient is located at home, the provider is located at the [facility] office. The participants in this visit include the listed provider and patient. The visit was conducted today via video.   Chief Complaint: OBESITY Tikeya is here to discuss her progress with her obesity treatment plan along with follow-up of her obesity related diagnoses. Maddilynn is on following a lower carbohydrate, vegetable and lean protein rich diet plan and 1150-1250 calories and 80 grams of protein and states she is following her eating plan approximately 70% of the time. Iniyah states she is doing walking aerobic videos for 30-45 minutes 3-5 times per week.  Today's visit was #: 45 Starting weight: 201 lbs Starting date: 12/10/2018  Interim History: Anallely has incorporated low carb into her journaling. She is very happy with weight loss today. She is doing a great job with her exercise. She is  meeting calories and protein goals. Her weight has not been this low since August 2021.   Subjective:   1. Other constipation Riley notes constipation well controlled with fiber gummies. She has been eating more vegetables.  Assessment/Plan:   1. Other constipation Jeremi will continue fiber gummies, increase water and vegetable intake.   2. Obesity: BMI 31.00 Zemora is currently in the action stage of change. As such, her goal is to continue with weight loss efforts. She has agreed to following a lower carbohydrate, vegetable and lean protein rich diet plan and 1150-1250 calories and 70 grams of proteins.   Exercise goals:  As is.  Behavioral modification strategies: increasing lean protein intake, decreasing simple carbohydrates, and  planning for success.  Makani has agreed to follow-up with our clinic in 3 weeks (Fasting).  Objective:   VITALS: Per patient if applicable, see vitals. GENERAL: Alert and in no acute distress. CARDIOPULMONARY: No increased WOB. Speaking in clear sentences.  PSYCH: Pleasant and cooperative. Speech normal rate and rhythm. Affect is appropriate. Insight and judgement are appropriate. Attention is focused, linear, and appropriate.  NEURO: Oriented as arrived to appointment on time with no prompting.   Lab Results  Component Value Date   CREATININE 0.80 11/10/2020   BUN 18 11/10/2020   NA 140 11/10/2020   K 4.6 11/10/2020   CL 104 11/10/2020   CO2 26 11/10/2020   Lab Results  Component Value Date   ALT 18 11/10/2020   AST 16 11/10/2020   ALKPHOS 108 11/10/2020   BILITOT 0.3 11/10/2020   Lab Results  Component Value Date   HGBA1C 5.0 11/10/2020   HGBA1C 5.0 05/27/2020   HGBA1C 4.8 10/21/2019   HGBA1C 4.9 12/10/2018   Lab Results  Component Value Date   INSULIN 11.8 11/10/2020   INSULIN 13.5 05/27/2020   INSULIN 16.6 12/10/2018   Lab Results  Component Value Date   TSH 2.940 11/10/2020   Lab Results  Component Value Date   CHOL 197 11/10/2020   HDL 53 11/10/2020   LDLCALC 135 (H) 11/10/2020   TRIG 50 11/10/2020   CHOLHDL 4.0 10/21/2019   Lab Results  Component Value Date   VD25OH 53.6 11/10/2020   VD25OH 54.1 05/27/2020   VD25OH 43.1 12/10/2018   Lab Results  Component Value Date   WBC 4.3 05/27/2020   HGB  12.2 05/27/2020   HCT 38.8 05/27/2020   MCV 89 05/27/2020   PLT 227 05/27/2020   No results found for: IRON, TIBC, FERRITIN  Attestation Statements:   Reviewed by clinician on day of visit: allergies, medications, problem list, medical history, surgical history, family history, social history, and previous encounter notes.   I, Lizbeth Bark, RMA, am acting as Location manager for [employer], [place].  I have reviewed the above documentation for  accuracy and completeness, and I agree with the above. - Georgianne Fick, FNP

## 2021-06-30 ENCOUNTER — Ambulatory Visit (INDEPENDENT_AMBULATORY_CARE_PROVIDER_SITE_OTHER): Payer: BC Managed Care – PPO | Admitting: Family Medicine

## 2021-07-14 ENCOUNTER — Ambulatory Visit (INDEPENDENT_AMBULATORY_CARE_PROVIDER_SITE_OTHER): Payer: BC Managed Care – PPO | Admitting: Family Medicine

## 2021-07-14 ENCOUNTER — Other Ambulatory Visit: Payer: Self-pay

## 2021-07-14 ENCOUNTER — Encounter (INDEPENDENT_AMBULATORY_CARE_PROVIDER_SITE_OTHER): Payer: Self-pay | Admitting: Family Medicine

## 2021-07-14 VITALS — BP 115/72 | HR 69 | Temp 98.0°F | Ht 66.0 in | Wt 196.0 lb

## 2021-07-14 DIAGNOSIS — E8881 Metabolic syndrome: Secondary | ICD-10-CM | POA: Diagnosis not present

## 2021-07-14 DIAGNOSIS — E669 Obesity, unspecified: Secondary | ICD-10-CM

## 2021-07-14 DIAGNOSIS — E559 Vitamin D deficiency, unspecified: Secondary | ICD-10-CM

## 2021-07-14 DIAGNOSIS — E039 Hypothyroidism, unspecified: Secondary | ICD-10-CM

## 2021-07-14 DIAGNOSIS — E7849 Other hyperlipidemia: Secondary | ICD-10-CM

## 2021-07-14 DIAGNOSIS — Z6832 Body mass index (BMI) 32.0-32.9, adult: Secondary | ICD-10-CM

## 2021-07-14 DIAGNOSIS — E88819 Insulin resistance, unspecified: Secondary | ICD-10-CM

## 2021-07-14 NOTE — Progress Notes (Signed)
Chief Complaint:   OBESITY Alisha Chen is here to discuss her progress with her obesity treatment plan along with follow-up of her obesity related diagnoses. Alisha Chen is on keeping a food journal and adhering to recommended goals of 1150-1250 calories and 70 grams of protein and following a lower carbohydrate, vegetable and lean protein rich diet plan and states she is following her eating plan approximately 50% of the time. Alisha Chen states she is walking for 30-60 minutes 3 times per week.  Today's visit was #: 26 Starting weight: 201 lbs Starting date: 12/10/2018 Today's weight: 196 lbs Today's date: 07/14/2021 Total lbs lost to date: 5 lbs Total lbs lost since last in-office visit: 0  Interim History: Alisha Chen has been off of the low carb plan since she has been traveling recently. She did journal when she traveled and exceeded her calorie goal. She feels she would like to continue to work on reducing carbs. She likes to have a variety in her lunch so she does not want to always pack lunch. Sometimes she goes to the salad bar at [employer].  Subjective:   1. Insulin resistance Alisha Chen's last A1C was 5.0. She is currently not on Metformin (caused metallic taste).  Lab Results  Component Value Date   INSULIN 11.8 11/10/2020   INSULIN 13.5 05/27/2020   INSULIN 16.6 12/10/2018   Lab Results  Component Value Date   HGBA1C 5.0 11/10/2020    2. Hypothyroidism, unspecified type Alisha Chen is currently on Levothyroxine 50 mcg. She feels stable on this dose. She does not have palpitations or heat/cold intolerance.  Lab Results  Component Value Date   TSH 2.940 11/10/2020     3. Vitamin D deficiency Alisha Chen's Vitamin D is at goal (53.6) on OTC Vitamin D 1000 mcg.  Lab Results  Component Value Date   VD25OH 53.6 11/10/2020   VD25OH 54.1 05/27/2020   VD25OH 43.1 12/10/2018    4. Other hyperlipidemia Alisha Chen's last LDL was elevated at 135. Her Triglycerides and HDL were within normal limits.  She is currently not on statin.  Lab Results  Component Value Date   CHOL 197 11/10/2020   HDL 53 11/10/2020   LDLCALC 135 (H) 11/10/2020   TRIG 50 11/10/2020   CHOLHDL 4.0 10/21/2019   Lab Results  Component Value Date   ALT 18 11/10/2020   AST 16 11/10/2020   ALKPHOS 108 11/10/2020   BILITOT 0.3 11/10/2020   The 10-year ASCVD risk score Mikey Bussing DC Jr., et al., 2013) is: 2.6%   Values used to calculate the score:     Age: 55 years     Sex: Female     Is Non-Hispanic African American: Yes     Diabetic: No     Tobacco smoker: No     Systolic Blood Pressure: [id number]     Is BP treated: No     HDL Cholesterol: 53 mg/dL     Total Cholesterol: 197 mg/dL   Assessment/Plan:   1. Insulin resistance Alisha Chen will continue to work on weight loss, exercise, and decreasing simple carbohydrates to help decrease the risk of diabetes. We will check labs today.Alisha Chen agreed to follow-up with [place] as directed to closely monitor her progress.  - Hemoglobin A1c - Insulin, random - Comprehensive metabolic panel  2. Hypothyroidism, unspecified type We will check labs today.- TSH  3. Vitamin D deficiency Alisha Chen agrees to continue to take OTC Vitamin D 1,000 IU every day and she will follow-up for routine testing  of Vitamin D, at least 2-3 times per year to avoid over-replacement. We will check labs today.  - VITAMIN D 25 Hydroxy (Vit-D Deficiency, Fractures)  4. Other hyperlipidemia Cardiovascular risk and specific lipid/LDL goals reviewed.  We discussed several lifestyle modifications today and Alisha Chen will continue to work on diet, exercise and weight loss efforts. We will check FLP today.   - Lipid Panel With LDL/HDL Ratio  5. Obesity: BMI 31.65 Alisha Chen is currently in the action stage of change. As such, her goal is to continue with weight loss efforts. She has agreed to keeping a food journal and adhering to recommended goals of 1150-1250 calories and 70 grams of  protein daily and following a  lower carbohydrate, vegetable and lean protein rich diet plan.   Encouraged Alisha Chen to try to pack her lunch at least a few days per week.  Exercise goals:  As is.  Behavioral modification strategies: increasing lean protein intake, decreasing simple carbohydrates, and keeping a strict food journal.  Alisha Chen has agreed to follow-up with our clinic in 3-4 weeks.  Objective:   Blood pressure 115/72, pulse 69, temperature 98 F (36.7 C), height '5\' 6"'$  (1.676 m), weight 196 lb (88.9 kg), last menstrual period 02/27/2012, SpO2 98 %. Body mass index is 31.64 kg/m.  General: Cooperative, alert, well developed, in no acute distress. HEENT: Conjunctivae and lids unremarkable. Cardiovascular: Regular rhythm.  Lungs: Normal work of breathing. Neurologic: No focal deficits.   Lab Results  Component Value Date   CREATININE 0.80 11/10/2020   BUN 18 11/10/2020   NA 140 11/10/2020   K 4.6 11/10/2020   CL 104 11/10/2020   CO2 26 11/10/2020   Lab Results  Component Value Date   ALT 18 11/10/2020   AST 16 11/10/2020   ALKPHOS 108 11/10/2020   BILITOT 0.3 11/10/2020   Lab Results  Component Value Date   HGBA1C 5.0 11/10/2020   HGBA1C 5.0 05/27/2020   HGBA1C 4.8 10/21/2019   HGBA1C 4.9 12/10/2018   Lab Results  Component Value Date   INSULIN 11.8 11/10/2020   INSULIN 13.5 05/27/2020   INSULIN 16.6 12/10/2018   Lab Results  Component Value Date   TSH 2.940 11/10/2020   Lab Results  Component Value Date   CHOL 197 11/10/2020   HDL 53 11/10/2020   LDLCALC 135 (H) 11/10/2020   TRIG 50 11/10/2020   CHOLHDL 4.0 10/21/2019   Lab Results  Component Value Date   VD25OH 53.6 11/10/2020   VD25OH 54.1 05/27/2020   VD25OH 43.1 12/10/2018   Lab Results  Component Value Date   WBC 4.3 05/27/2020   HGB 12.2 05/27/2020   HCT 38.8 05/27/2020   MCV 89 05/27/2020   PLT 227 05/27/2020   No results found for: IRON, TIBC, FERRITIN  Attestation Statements:   Reviewed by clinician on  day of visit: allergies, medications, problem list, medical history, surgical history, family history, social history, and previous encounter notes.  I, Lizbeth Bark, RMA, am acting as Location manager for [employer], [place].   I have reviewed the above documentation for accuracy and completeness, and I agree with the above. -  Georgianne Fick, FNP

## 2021-07-15 LAB — COMPREHENSIVE METABOLIC PANEL
ALT: 17 IU/L (ref 0–32)
AST: 15 IU/L (ref 0–40)
Albumin/Globulin Ratio: 2 (ref 1.2–2.2)
Albumin: 4.5 g/dL (ref 3.8–4.9)
Alkaline Phosphatase: 112 IU/L (ref 44–121)
BUN/Creatinine Ratio: 21 (ref 9–23)
BUN: 17 mg/dL (ref 6–24)
Bilirubin Total: 0.3 mg/dL (ref 0.0–1.2)
CO2: 25 mmol/L (ref 20–29)
Calcium: 10.6 mg/dL — ABNORMAL HIGH (ref 8.7–10.2)
Chloride: 104 mmol/L (ref 96–106)
Creatinine, Ser: 0.82 mg/dL (ref 0.57–1.00)
Globulin, Total: 2.3 g/dL (ref 1.5–4.5)
Glucose: 78 mg/dL (ref 65–99)
Potassium: 5 mmol/L (ref 3.5–5.2)
Sodium: 139 mmol/L (ref 134–144)
Total Protein: 6.8 g/dL (ref 6.0–8.5)
eGFR: 84 mL/min/{1.73_m2} (ref >59)

## 2021-07-15 LAB — LIPID PANEL WITH LDL/HDL RATIO
Cholesterol, Total: 204 mg/dL — ABNORMAL HIGH (ref 100–199)
HDL: 51 mg/dL (ref >39)
LDL Chol Calc (NIH): 145 mg/dL — ABNORMAL HIGH (ref 0–99)
LDL/HDL Ratio: 2.8 ratio (ref 0.0–3.2)
Triglycerides: 45 mg/dL (ref 0–149)
VLDL Cholesterol Cal: 8 mg/dL (ref 5–40)

## 2021-07-15 LAB — HEMOGLOBIN A1C
Est. average glucose Bld gHb Est-mCnc: 91 mg/dL
Hgb A1c MFr Bld: 4.8 % (ref 4.8–5.6)

## 2021-07-15 LAB — VITAMIN D 25 HYDROXY (VIT D DEFICIENCY, FRACTURES): Vit D, 25-Hydroxy: 57.5 ng/mL (ref 30.0–100.0)

## 2021-07-15 LAB — INSULIN, RANDOM: INSULIN: 12.6 u[IU]/mL (ref 2.6–24.9)

## 2021-07-15 LAB — TSH: TSH: 2.55 u[IU]/mL (ref 0.450–4.500)

## 2021-07-17 ENCOUNTER — Encounter (INDEPENDENT_AMBULATORY_CARE_PROVIDER_SITE_OTHER): Payer: Self-pay | Admitting: Family Medicine

## 2021-08-11 ENCOUNTER — Encounter (INDEPENDENT_AMBULATORY_CARE_PROVIDER_SITE_OTHER): Payer: Self-pay | Admitting: Physician Assistant

## 2021-08-11 ENCOUNTER — Other Ambulatory Visit: Payer: Self-pay

## 2021-08-11 ENCOUNTER — Ambulatory Visit (INDEPENDENT_AMBULATORY_CARE_PROVIDER_SITE_OTHER): Payer: BC Managed Care – PPO | Admitting: Physician Assistant

## 2021-08-11 VITALS — BP 104/69 | HR 74 | Temp 98.1°F | Ht 66.0 in | Wt 192.0 lb

## 2021-08-11 DIAGNOSIS — Z6832 Body mass index (BMI) 32.0-32.9, adult: Secondary | ICD-10-CM

## 2021-08-11 DIAGNOSIS — E669 Obesity, unspecified: Secondary | ICD-10-CM | POA: Diagnosis not present

## 2021-08-11 DIAGNOSIS — E559 Vitamin D deficiency, unspecified: Secondary | ICD-10-CM | POA: Diagnosis not present

## 2021-08-11 NOTE — Progress Notes (Signed)
Chief Complaint:   OBESITY Alisha Chen is here to discuss her progress with her obesity treatment plan along with follow-up of her obesity related diagnoses. Alisha Chen is on keeping a food journal and adhering to recommended goals of 1150-1250 calories and 70 grams of protein and following a lower carbohydrate, vegetable and lean protein rich diet plan and states she is following her eating plan approximately 70% of the time. Alisha Chen states she is doing walking aerobics video for 30-60 minutes 3 times per week.  Today's visit was #: 27 Starting weight: 201 lbs Starting date: 12/10/2018 Today's weight: 192 lbs Today's date: 08/11/2021 Total lbs lost to date: 9 lbs Total lbs lost since last in-office visit: 4 lbs  Interim History: Alisha Chen did well with weight loss. She followed the low carb plan more so than journaling the last few weeks. She would like to continue with low carb. She is sometimes skipping dinner.  Subjective:   1. Vitamin D deficiency Alisha Chen is on OTC Vitamin D. Her last Vitamin D level was at goal.  Assessment/Plan:   1. Vitamin D deficiency Low Vitamin D level contributes to fatigue and are associated with obesity, breast, and colon cancer. Alisha Chen agrees to continue to take OTC Vitamin D 1,000 IU daily and she will follow-up for routine testing of Vitamin D, at least 2-3 times per year to avoid over-replacement.   2. Obesity: BMI 31 Alisha Chen is currently in the action stage of change. As such, her goal is to continue with weight loss efforts. She has agreed to following a lower carbohydrate, vegetable and lean protein rich diet plan.   Exercise goals:  As is.  Behavioral modification strategies: meal planning and cooking strategies and keeping healthy foods in the home.  Alisha Chen has agreed to follow-up with our clinic in 3-4 weeks. She was informed of the importance of frequent follow-up visits to maximize her success with intensive lifestyle modifications for her multiple health  conditions.   Objective:   Blood pressure 104/69, pulse 74, temperature 98.1 F (36.7 C), height 5\' 6"  (1.676 m), weight 192 lb (87.1 kg), last menstrual period 02/27/2012, SpO2 97 %. Body mass index is 30.99 kg/m.  General: Cooperative, alert, well developed, in no acute distress. HEENT: Conjunctivae and lids unremarkable. Cardiovascular: Regular rhythm.  Lungs: Normal work of breathing. Neurologic: No focal deficits.   Lab Results  Component Value Date   CREATININE 0.82 07/14/2021   BUN 17 07/14/2021   NA 139 07/14/2021   K 5.0 07/14/2021   CL 104 07/14/2021   CO2 25 07/14/2021   Lab Results  Component Value Date   ALT 17 07/14/2021   AST 15 07/14/2021   ALKPHOS 112 07/14/2021   BILITOT 0.3 07/14/2021   Lab Results  Component Value Date   HGBA1C 4.8 07/14/2021   HGBA1C 5.0 11/10/2020   HGBA1C 5.0 05/27/2020   HGBA1C 4.8 10/21/2019   HGBA1C 4.9 12/10/2018   Lab Results  Component Value Date   INSULIN 12.6 07/14/2021   INSULIN 11.8 11/10/2020   INSULIN 13.5 05/27/2020   INSULIN 16.6 12/10/2018   Lab Results  Component Value Date   TSH 2.550 07/14/2021   Lab Results  Component Value Date   CHOL 204 (H) 07/14/2021   HDL 51 07/14/2021   LDLCALC 145 (H) 07/14/2021   TRIG 45 07/14/2021   CHOLHDL 4.0 10/21/2019   Lab Results  Component Value Date   VD25OH 57.5 07/14/2021   VD25OH 53.6 11/10/2020   VD25OH 54.1  05/27/2020   Lab Results  Component Value Date   WBC 4.3 05/27/2020   HGB 12.2 05/27/2020   HCT 38.8 05/27/2020   MCV 89 05/27/2020   PLT 227 05/27/2020   No results found for: IRON, TIBC, FERRITIN  Attestation Statements:   Reviewed by clinician on day of visit: allergies, medications, problem list, medical history, surgical history, family history, social history, and previous encounter notes.  Time spent on visit including pre-visit chart review and post-visit care and charting was 30 minutes.   I, Tonye Pearson, am acting as  Location manager for [employer], [place]-C.   I have reviewed the above documentation for accuracy and completeness, and I agree with the above. Abby Potash, PA-C

## 2021-08-26 ENCOUNTER — Other Ambulatory Visit: Payer: Self-pay | Admitting: Family Medicine

## 2021-09-01 ENCOUNTER — Encounter (INDEPENDENT_AMBULATORY_CARE_PROVIDER_SITE_OTHER): Payer: Self-pay | Admitting: Family Medicine

## 2021-09-01 ENCOUNTER — Other Ambulatory Visit: Payer: Self-pay

## 2021-09-01 ENCOUNTER — Ambulatory Visit (INDEPENDENT_AMBULATORY_CARE_PROVIDER_SITE_OTHER): Payer: BC Managed Care – PPO | Admitting: Family Medicine

## 2021-09-01 VITALS — BP 122/75 | HR 59 | Temp 98.0°F | Ht 66.0 in | Wt 198.0 lb

## 2021-09-01 DIAGNOSIS — E7849 Other hyperlipidemia: Secondary | ICD-10-CM | POA: Diagnosis not present

## 2021-09-01 DIAGNOSIS — E039 Hypothyroidism, unspecified: Secondary | ICD-10-CM

## 2021-09-01 DIAGNOSIS — E669 Obesity, unspecified: Secondary | ICD-10-CM | POA: Diagnosis not present

## 2021-09-01 DIAGNOSIS — E559 Vitamin D deficiency, unspecified: Secondary | ICD-10-CM | POA: Diagnosis not present

## 2021-09-01 DIAGNOSIS — Z6832 Body mass index (BMI) 32.0-32.9, adult: Secondary | ICD-10-CM

## 2021-09-01 NOTE — Progress Notes (Signed)
Chief Complaint:   OBESITY Alisha Chen is here to discuss her progress with her obesity treatment plan along with follow-up of her obesity related diagnoses. Alisha Chen is on keeping a food journal and adhering to recommended goals of 1150-1250 calories and 80 grams of protein and following a lower carbohydrate, vegetable and lean protein rich diet plan and states she is following her eating plan approximately 40% of the time. Alisha Chen states she is walking and doing aerobics for 30 minutes 3 times per week.  Today's visit was #: 74 Starting weight: 201 lbs Starting date: 12/10/2018 Today's weight: 198 lbs Today's date: 09/01/2021 Total lbs lost to date: 3 lbs Total lbs lost since last in-office visit: 0  Interim History: Alisha Chen has had a lot of personal stress in her life recently. She has been journaling consistently but going over calories and under on protein. She tries to follow low carb plan.  Subjective:   1. Other hyperlipidemia Alisha Chen's LDL was elevated at 145. Her Triglycerides and HDL was within normal limits. She is not on statin. Her 10 year ASCVD risk score is 3.3%.   Lab Results  Component Value Date   CHOL 204 (H) 07/14/2021   HDL 51 07/14/2021   LDLCALC 145 (H) 07/14/2021   TRIG 45 07/14/2021   CHOLHDL 4.0 10/21/2019   Lab Results  Component Value Date   ALT 17 07/14/2021   AST 15 07/14/2021   ALKPHOS 112 07/14/2021   BILITOT 0.3 07/14/2021   The 10-year ASCVD risk score (Arnett DK, et al., 2019) is: 3.3%   Values used to calculate the score:     Age: 55 years     Sex: Female     Is Non-Hispanic African American: Yes     Diabetic: No     Tobacco smoker: No     Systolic Blood Pressure: 295 mmHg     Is BP treated: No     HDL Cholesterol: 51 mg/dL     Total Cholesterol: 204 mg/dL   2. Vitamin D deficiency Alisha Chen's Vitamin D is at goal at 27.5. She is on OTC Vitamin D 1000 IU.  Lab Results  Component Value Date   VD25OH 57.5 07/14/2021   VD25OH 53.6 11/10/2020    VD25OH 54.1 05/27/2020    3. Hypothyroidism, unspecified type Alisha Chen's TSH level was normal at 2.55. She is stable on 50 mcg of Levothyroxine.   Lab Results  Component Value Date   TSH 2.550 07/14/2021     Assessment/Plan:   1. Other hyperlipidemia Alisha Chen will minimize saturated fats. No statin indicated. We discussed labs today.   2. Vitamin D deficiency Alisha Chen agrees to continue to take OTC Vitamin D 3  1,000 IU daily and she will follow-up for routine testing of Vitamin D, at least 2-3 times per year to avoid over-replacement. We discussed labs today.  3. Hypothyroidism, unspecified type Alisha Chen will continue Levothyroxine 50 mcg daily.   4. Obesity: BMI 31.97 Alisha Chen is currently in the action stage of change. As such, her goal is to continue with weight loss efforts. She has agreed to keeping a food journal and adhering to recommended goals of 1150-1250 calories and 80 grams of protein or following a lower carbohydrate, vegetable and lean protein rich diet plan.   We discussed if it was best to do low carb consistently vs switching back and forth to journaling. We also discussed Mounjaro briefly.  Exercise goals:  As is.  Behavioral modification strategies: increasing lean protein intake  and decreasing simple carbohydrates.  Alisha Chen has agreed to follow-up with our clinic in 3-4 weeks.  Objective:   Blood pressure 122/75, pulse (!) 59, temperature 98 F (36.7 C), height 5\' 6"  (1.676 m), weight 198 lb (89.8 kg), last menstrual period 02/27/2012, SpO2 99 %. Body mass index is 31.96 kg/m.  General: Cooperative, alert, well developed, in no acute distress. HEENT: Conjunctivae and lids unremarkable. Cardiovascular: Regular rhythm.  Lungs: Normal work of breathing. Neurologic: No focal deficits.   Lab Results  Component Value Date   CREATININE 0.82 07/14/2021   BUN 17 07/14/2021   NA 139 07/14/2021   K 5.0 07/14/2021   CL 104 07/14/2021   CO2 25 07/14/2021   Lab Results   Component Value Date   ALT 17 07/14/2021   AST 15 07/14/2021   ALKPHOS 112 07/14/2021   BILITOT 0.3 07/14/2021   Lab Results  Component Value Date   HGBA1C 4.8 07/14/2021   HGBA1C 5.0 11/10/2020   HGBA1C 5.0 05/27/2020   HGBA1C 4.8 10/21/2019   HGBA1C 4.9 12/10/2018   Lab Results  Component Value Date   INSULIN 12.6 07/14/2021   INSULIN 11.8 11/10/2020   INSULIN 13.5 05/27/2020   INSULIN 16.6 12/10/2018   Lab Results  Component Value Date   TSH 2.550 07/14/2021   Lab Results  Component Value Date   CHOL 204 (H) 07/14/2021   HDL 51 07/14/2021   LDLCALC 145 (H) 07/14/2021   TRIG 45 07/14/2021   CHOLHDL 4.0 10/21/2019   Lab Results  Component Value Date   VD25OH 57.5 07/14/2021   VD25OH 53.6 11/10/2020   VD25OH 54.1 05/27/2020   Lab Results  Component Value Date   WBC 4.3 05/27/2020   HGB 12.2 05/27/2020   HCT 38.8 05/27/2020   MCV 89 05/27/2020   PLT 227 05/27/2020   No results found for: IRON, TIBC, FERRITIN  Attestation Statements:   Reviewed by clinician on day of visit: allergies, medications, problem list, medical history, surgical history, family history, social history, and previous encounter notes.  Time spent on visit including pre-visit chart review and post-visit care and charting was 31 minutes.   I, Lizbeth Bark, RMA, am acting as Location manager for [employer], [place].   I have reviewed the above documentation for accuracy and completeness, and I agree with the above. -  Georgianne Fick, FNP

## 2021-09-01 NOTE — Progress Notes (Signed)
The 10-year ASCVD risk score (Arnett DK, et al., 2019) is: 3.3%   Values used to calculate the score:     Age: 55 years     Sex: Female     Is Non-Hispanic African American: Yes     Diabetic: No     Tobacco smoker: No     Systolic Blood Pressure: 979 mmHg     Is BP treated: No     HDL Cholesterol: 51 mg/dL     Total Cholesterol: 204 mg/dL

## 2021-09-22 ENCOUNTER — Encounter (INDEPENDENT_AMBULATORY_CARE_PROVIDER_SITE_OTHER): Payer: Self-pay | Admitting: Family Medicine

## 2021-09-22 ENCOUNTER — Ambulatory Visit (INDEPENDENT_AMBULATORY_CARE_PROVIDER_SITE_OTHER): Payer: BC Managed Care – PPO | Admitting: Family Medicine

## 2021-09-22 ENCOUNTER — Other Ambulatory Visit: Payer: Self-pay

## 2021-09-22 VITALS — BP 110/70 | HR 68 | Temp 97.9°F | Ht 66.0 in | Wt 197.0 lb

## 2021-09-22 DIAGNOSIS — E88819 Insulin resistance, unspecified: Secondary | ICD-10-CM

## 2021-09-22 DIAGNOSIS — Z6832 Body mass index (BMI) 32.0-32.9, adult: Secondary | ICD-10-CM

## 2021-09-22 DIAGNOSIS — E8881 Metabolic syndrome: Secondary | ICD-10-CM | POA: Diagnosis not present

## 2021-09-22 DIAGNOSIS — E669 Obesity, unspecified: Secondary | ICD-10-CM | POA: Diagnosis not present

## 2021-09-22 NOTE — Progress Notes (Signed)
Chief Complaint:   OBESITY Alisha Chen is here to discuss her progress with her obesity treatment plan along with follow-up of her obesity related diagnoses. Deleah is on keeping a food journal and adhering to recommended goals of 1150-1250 calories and 80 grams of protein or following a lower carbohydrate, vegetable and lean protein rich diet plan and states she is following her eating plan approximately 50% of the time. Mardy states she is doing You Tube video for 30-45 minutes 3-4 times per week.  Today's visit was #: 31 Starting weight: 201 lbs Starting date: 12/10/2018 Today's weight: 197 lbs Today's date: 09/22/2021 Total lbs lost to date: 4 lbs Total lbs lost since last in-office visit: 1 lb  Interim History: Angeni has been consistent with exercise recently. She is journaling somewhat inconsistently. She follows the low carb plan when she does not journal. She tends to go over on calories when she meets her protein goals. She struggles to maintain her motivation to adhere well to the plan.  She notes some hunger and cravings.    Subjective:   1. Insulin resistance We discussed possibly starting Mounjaro. Keagan's last A1C was 4.8. Her fasting Insulin resistance was elevated at 12.6.  Lab Results  Component Value Date   INSULIN 12.6 07/14/2021   INSULIN 11.8 11/10/2020   INSULIN 13.5 05/27/2020   INSULIN 16.6 12/10/2018   Lab Results  Component Value Date   HGBA1C 4.8 07/14/2021    Assessment/Plan:   1. Insulin resistance Kelleigh declines Mounjaro.   2. Obesity: BMI 31.97 Shadi is currently in the action stage of change. As such, her goal is to continue with weight loss efforts. She has agreed to keeping a food journal and adhering to recommended goals of 1150-1250 calories and 80 grams of protein daily or following a lower carbohydrate, vegetable and lean protein rich diet plan.   Shenekia try to adhere to low carb plan but have one off plan meal per week.  Exercise goals:   As is.  Behavioral modification strategies: increasing lean protein intake and decreasing simple carbohydrates.  Taaliyah has agreed to follow-up with our clinic in 4 weeks.  Objective:   Blood pressure 110/70, pulse 68, temperature 97.9 F (36.6 C), height 5\' 6"  (1.676 m), weight 197 lb (89.4 kg), last menstrual period 02/27/2012, SpO2 99 %. Body mass index is 31.8 kg/m.  General: Cooperative, alert, well developed, in no acute distress. HEENT: Conjunctivae and lids unremarkable. Cardiovascular: Regular rhythm.  Lungs: Normal work of breathing. Neurologic: No focal deficits.   Lab Results  Component Value Date   CREATININE 0.82 07/14/2021   BUN 17 07/14/2021   NA 139 07/14/2021   K 5.0 07/14/2021   CL 104 07/14/2021   CO2 25 07/14/2021   Lab Results  Component Value Date   ALT 17 07/14/2021   AST 15 07/14/2021   ALKPHOS 112 07/14/2021   BILITOT 0.3 07/14/2021   Lab Results  Component Value Date   HGBA1C 4.8 07/14/2021   HGBA1C 5.0 11/10/2020   HGBA1C 5.0 05/27/2020   HGBA1C 4.8 10/21/2019   HGBA1C 4.9 12/10/2018   Lab Results  Component Value Date   INSULIN 12.6 07/14/2021   INSULIN 11.8 11/10/2020   INSULIN 13.5 05/27/2020   INSULIN 16.6 12/10/2018   Lab Results  Component Value Date   TSH 2.550 07/14/2021   Lab Results  Component Value Date   CHOL 204 (H) 07/14/2021   HDL 51 07/14/2021   LDLCALC 145 (H) 07/14/2021  TRIG 45 07/14/2021   CHOLHDL 4.0 10/21/2019   Lab Results  Component Value Date   VD25OH 57.5 07/14/2021   VD25OH 53.6 11/10/2020   VD25OH 54.1 05/27/2020   Lab Results  Component Value Date   WBC 4.3 05/27/2020   HGB 12.2 05/27/2020   HCT 38.8 05/27/2020   MCV 89 05/27/2020   PLT 227 05/27/2020   No results found for: IRON, TIBC, FERRITIN  Attestation Statements:   Reviewed by clinician on day of visit: allergies, medications, problem list, medical history, surgical history, family history, social history, and previous  encounter notes.  I, Lizbeth Bark, RMA, am acting as Location manager for [employer], [place].   I have reviewed the above documentation for accuracy and completeness, and I agree with the above. -  Georgianne Fick, FNP

## 2021-10-04 ENCOUNTER — Encounter (INDEPENDENT_AMBULATORY_CARE_PROVIDER_SITE_OTHER): Payer: Self-pay | Admitting: Family Medicine

## 2021-10-05 NOTE — Telephone Encounter (Signed)
Pt last seen by Dawn Whitmire, FNP.  

## 2021-10-19 ENCOUNTER — Ambulatory Visit (INDEPENDENT_AMBULATORY_CARE_PROVIDER_SITE_OTHER): Payer: BC Managed Care – PPO | Admitting: Family Medicine

## 2021-11-10 ENCOUNTER — Other Ambulatory Visit: Payer: Self-pay

## 2021-11-10 ENCOUNTER — Ambulatory Visit (INDEPENDENT_AMBULATORY_CARE_PROVIDER_SITE_OTHER): Payer: BC Managed Care – PPO | Admitting: Family Medicine

## 2021-11-10 ENCOUNTER — Encounter (INDEPENDENT_AMBULATORY_CARE_PROVIDER_SITE_OTHER): Payer: Self-pay | Admitting: Family Medicine

## 2021-11-10 VITALS — BP 111/70 | HR 66 | Temp 98.1°F | Ht 66.0 in | Wt 199.0 lb

## 2021-11-10 DIAGNOSIS — E66811 Obesity, class 1: Secondary | ICD-10-CM

## 2021-11-10 DIAGNOSIS — Z6832 Body mass index (BMI) 32.0-32.9, adult: Secondary | ICD-10-CM

## 2021-11-10 DIAGNOSIS — E669 Obesity, unspecified: Secondary | ICD-10-CM

## 2021-11-10 DIAGNOSIS — E039 Hypothyroidism, unspecified: Secondary | ICD-10-CM | POA: Diagnosis not present

## 2021-11-10 NOTE — Progress Notes (Signed)
Chief Complaint:   OBESITY Alisha Chen is here to discuss her progress with her obesity treatment plan along with follow-up of her obesity related diagnoses. Alisha Chen is on keeping a food journal and adhering to recommended goals of 1150-1250 calories and 80 grams of protein and states she is following her eating plan approximately 50% of the time. Alisha Chen states she is walking for 30 minutes 3 times per week and doing aerobic videos for 45 minutes 3 times per week.  Today's visit was #: 59 Starting weight: 201 lbs Starting date: 12/10/2018 Today's weight: 199 lbs Today's date: 11/10/2021 Total lbs lost to date: 2 lbs Total lbs lost since last in-office visit: +2  Interim History: Alisha Chen's last office visit was 09-22-2021. Alisha Chen has been journaling. She has been trying to stop eating at 6-8 pm and she feels she loses weight is she does this. However, this is challenging for her. She still tends to go over on calories sometimes and struggles to meet protein goals. Her appetite is well controlled. She works very long days as a Neurosurgeon. She does eat out at lunch most work days.   Subjective:   1. Hypothyroidism, unspecified type Stable on 50 mcg of Synthroid. She denies heat/cold intolerance or palpations.   Lab Results  Component Value Date   TSH 2.550 07/14/2021     Assessment/Plan:   1. Hypothyroidism, unspecified type Alisha Chen will continue Synthroid 50 mcg. O  2. Obesity: BMI 32.13 Alisha Chen is currently in the action stage of change. As such, her goal is to continue with weight loss efforts. She has agreed to keeping a food journal and adhering to recommended goals of 1150-1250 calories and 80 grams of protein daily.   We discussed trying to pack lunch a few days per week.  Exercise goals:  As is.  Behavioral modification strategies: increasing lean protein intake, decreasing simple carbohydrates, and decreasing eating out.  Alisha Chen has agreed to follow-up with our clinic in  4 weeks.  Objective:   Blood pressure 111/70, pulse 66, temperature 98.1 F (36.7 C), height 5\' 6"  (1.676 m), weight 199 lb (90.3 kg), last menstrual period 02/27/2012, SpO2 98 %. Body mass index is 32.12 kg/m.  General: Cooperative, alert, well developed, in no acute distress. HEENT: Conjunctivae and lids unremarkable. Cardiovascular: Regular rhythm.  Lungs: Normal work of breathing. Neurologic: No focal deficits.   Lab Results  Component Value Date   CREATININE 0.82 07/14/2021   BUN 17 07/14/2021   NA 139 07/14/2021   K 5.0 07/14/2021   CL 104 07/14/2021   CO2 25 07/14/2021   Lab Results  Component Value Date   ALT 17 07/14/2021   AST 15 07/14/2021   ALKPHOS 112 07/14/2021   BILITOT 0.3 07/14/2021   Lab Results  Component Value Date   HGBA1C 4.8 07/14/2021   HGBA1C 5.0 11/10/2020   HGBA1C 5.0 05/27/2020   HGBA1C 4.8 10/21/2019   HGBA1C 4.9 12/10/2018   Lab Results  Component Value Date   INSULIN 12.6 07/14/2021   INSULIN 11.8 11/10/2020   INSULIN 13.5 05/27/2020   INSULIN 16.6 12/10/2018   Lab Results  Component Value Date   TSH 2.550 07/14/2021   Lab Results  Component Value Date   CHOL 204 (H) 07/14/2021   HDL 51 07/14/2021   LDLCALC 145 (H) 07/14/2021   TRIG 45 07/14/2021   CHOLHDL 4.0 10/21/2019   Lab Results  Component Value Date   VD25OH 57.5 07/14/2021   VD25OH  53.6 11/10/2020   VD25OH 54.1 05/27/2020   Lab Results  Component Value Date   WBC 4.3 05/27/2020   HGB 12.2 05/27/2020   HCT 38.8 05/27/2020   MCV 89 05/27/2020   PLT 227 05/27/2020   No results found for: IRON, TIBC, FERRITIN   Attestation Statements:   Reviewed by clinician on day of visit: allergies, medications, problem list, medical history, surgical history, family history, social history, and previous encounter notes.  I, Lizbeth Bark, RMA, am acting as Location manager for [employer], [place].  I have reviewed the above documentation for accuracy and completeness,  and I agree with the above. -  Georgianne Fick, FNP

## 2021-11-15 ENCOUNTER — Encounter (INDEPENDENT_AMBULATORY_CARE_PROVIDER_SITE_OTHER): Payer: Self-pay | Admitting: Family Medicine

## 2021-12-06 ENCOUNTER — Telehealth: Payer: Self-pay | Admitting: Adult Health

## 2021-12-06 NOTE — Telephone Encounter (Signed)
Sch per 1/14 inbasket, left msg

## 2021-12-08 ENCOUNTER — Encounter (HOSPITAL_BASED_OUTPATIENT_CLINIC_OR_DEPARTMENT_OTHER): Payer: Self-pay

## 2021-12-08 ENCOUNTER — Emergency Department (HOSPITAL_BASED_OUTPATIENT_CLINIC_OR_DEPARTMENT_OTHER)
Admission: EM | Admit: 2021-12-08 | Discharge: 2021-12-08 | Disposition: A | Discharge status: Home / Self Care | Payer: No Typology Code available for payment source | Attending: Emergency Medicine | Admitting: Emergency Medicine

## 2021-12-08 ENCOUNTER — Ambulatory Visit (INDEPENDENT_AMBULATORY_CARE_PROVIDER_SITE_OTHER): Payer: BC Managed Care – PPO | Admitting: Family Medicine

## 2021-12-08 ENCOUNTER — Other Ambulatory Visit: Payer: Self-pay

## 2021-12-08 ENCOUNTER — Encounter (INDEPENDENT_AMBULATORY_CARE_PROVIDER_SITE_OTHER): Payer: Self-pay | Admitting: Family Medicine

## 2021-12-08 VITALS — BP 106/72 | HR 74 | Temp 97.9°F | Ht 66.0 in | Wt 199.0 lb

## 2021-12-08 DIAGNOSIS — Z6832 Body mass index (BMI) 32.0-32.9, adult: Secondary | ICD-10-CM

## 2021-12-08 DIAGNOSIS — E88819 Insulin resistance, unspecified: Secondary | ICD-10-CM

## 2021-12-08 DIAGNOSIS — E669 Obesity, unspecified: Secondary | ICD-10-CM

## 2021-12-08 DIAGNOSIS — F509 Eating disorder, unspecified: Secondary | ICD-10-CM

## 2021-12-08 DIAGNOSIS — E8881 Metabolic syndrome: Secondary | ICD-10-CM

## 2021-12-08 DIAGNOSIS — S139XXA Sprain of joints and ligaments of unspecified parts of neck, initial encounter: Secondary | ICD-10-CM

## 2021-12-08 DIAGNOSIS — Y92410 Unspecified street and highway as the place of occurrence of the external cause: Secondary | ICD-10-CM | POA: Insufficient documentation

## 2021-12-08 DIAGNOSIS — S134XXA Sprain of ligaments of cervical spine, initial encounter: Secondary | ICD-10-CM | POA: Diagnosis not present

## 2021-12-08 DIAGNOSIS — S14109A Unspecified injury at unspecified level of cervical spinal cord, initial encounter: Secondary | ICD-10-CM | POA: Diagnosis present

## 2021-12-08 DIAGNOSIS — R42 Dizziness and giddiness: Secondary | ICD-10-CM

## 2021-12-08 MED ORDER — ACETAMINOPHEN 325 MG PO TABS
650.0000 mg | ORAL_TABLET | Freq: Once | ORAL | Status: AC
  Administered 2021-12-08: 650 mg via ORAL
  Filled 2021-12-08: qty 2

## 2021-12-08 MED ORDER — METHOCARBAMOL 500 MG PO TABS: 500.0000 mg | ORAL_TABLET | Freq: Two times a day (BID) | ORAL | 0 refills | Status: DC

## 2021-12-08 NOTE — ED Notes (Signed)
I see this pt. Only to perform initial assessment. All treatments thenceforth are performed by another.
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
## 2021-12-08 NOTE — Progress Notes (Addendum)
Chief Complaint:   OBESITY Alisha Chen is here to discuss her progress with her obesity treatment plan along with follow-up of her obesity related diagnoses. Alisha Chen is on keeping a food journal and adhering to recommended goals of 1150-1250 calories and 80 grams of protein and states she is following her eating plan approximately 50% of the time. Alisha Chen states she is doing walking aerobics for 30 minutes 3 times per week.  Today's visit was #: 83 Starting weight: 201 lbs Starting date: 12/10/2018 Today's weight: 199 lbs Today's date: 12/08/2021 Total lbs lost to date: 2 lbs Total lbs lost since last in-office visit: 0  Interim History: Alisha Chen says she had lost down to 194 lbs but then regained. She had some deaths in her family over the holidays-including the woman that raised her.  She feels she sabotages herself after doing well on the plan for a period of time and she is unsure why. She is journaling most days. She meets protein good but exceeds calorie goals. Some days she get up to 100 grams per day of protein. 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 She also spends a lot of time doing church activities. She has virtually no free time to meal prep.  She notes she does better with plan adherence when schedule is not as hectic such as over the holidays..   Subjective:   1. Insulin resistance When Alisha Chen meets protein goals, her hunger is more satisfied.   Lab Results  Component Value Date   INSULIN 12.6 07/14/2021   INSULIN 11.8 11/10/2020   INSULIN 13.5 05/27/2020   INSULIN 16.6 12/10/2018   Lab Results  Component Value Date   HGBA1C 4.8 07/14/2021    2. Eating disorder, unspecified type Alisha Chen states she self sabotages. She will lose weight but then regain before visit.  Assessment/Plan:   1. Insulin resistance Alisha Chen will continue to work on getting in protein consistently.   2. Eating disorder, unspecified type Alisha Chen was referred to Dr. Mallie Mussel our  Bariatric Psychologist. Alisha Chen agreed to the referral today after declining in the past.   3. Obesity: BMI 32.13 Alisha Chen is currently in the action stage of change. As such, her goal is to continue with weight loss efforts. She has agreed to keeping a food journal and adhering to recommended goals of 1150-1250 calories and 80 grams of protein daily.  Exercise goals:  As is.  Behavioral modification strategies: meal planning and cooking strategies.  Alisha Chen has agreed to follow-up with our clinic in 4 weeks.  Objective:   Blood pressure 106/72, pulse 74, temperature 97.9 F (36.6 C), height 5\' 6"  (1.676 m), weight 199 lb (90.3 kg), last menstrual period 02/27/2012, SpO2 99 %. Body mass index is 32.12 kg/m.  General: Cooperative, alert, well developed, in no acute distress. HEENT: Conjunctivae and lids unremarkable. Cardiovascular: Regular rhythm.  Lungs: Normal work of breathing. Neurologic: No focal deficits.   Lab Results  Component Value Date   CREATININE 0.82 07/14/2021   BUN 17 07/14/2021   NA 139 07/14/2021   K 5.0 07/14/2021   CL 104 07/14/2021   CO2 25 07/14/2021   Lab Results  Component Value Date   ALT 17 07/14/2021   AST 15 07/14/2021   ALKPHOS 112 07/14/2021   BILITOT 0.3 07/14/2021   Lab Results  Component Value Date   HGBA1C 4.8 07/14/2021   HGBA1C 5.0 11/10/2020   HGBA1C 5.0 05/27/2020   HGBA1C 4.8 10/21/2019  HGBA1C 4.9 12/10/2018   Lab Results  Component Value Date   INSULIN 12.6 07/14/2021   INSULIN 11.8 11/10/2020   INSULIN 13.5 05/27/2020   INSULIN 16.6 12/10/2018   Lab Results  Component Value Date   TSH 2.550 07/14/2021   Lab Results  Component Value Date   CHOL 204 (H) 07/14/2021   HDL 51 07/14/2021   LDLCALC 145 (H) 07/14/2021   TRIG 45 07/14/2021   CHOLHDL 4.0 10/21/2019   Lab Results  Component Value Date   VD25OH 57.5 07/14/2021   VD25OH 53.6 11/10/2020   VD25OH 54.1 05/27/2020   Lab Results  Component Value Date   WBC  4.3 05/27/2020   HGB 12.2 05/27/2020   HCT 38.8 05/27/2020   MCV 89 05/27/2020   PLT 227 05/27/2020   No results found for: IRON, TIBC, FERRITIN  Attestation Statements:   Reviewed by clinician on day of visit: allergies, medications, problem list, medical history, surgical history, family history, social history, and previous encounter notes.  I, Lizbeth Bark, RMA, am acting as Location manager for [employer], [place].  I have reviewed the above documentation for accuracy and completeness, and I agree with the above. -  Georgianne Fick, FNP

## 2021-12-08 NOTE — Progress Notes (Signed)
Office: [phone]  /  Fax: [phone]    Date: December 20, 2021   Appointment Start Time: 12:11pm Duration: 37 minutes Provider: Glennie Isle, Psy.D. Type of Session: Intake for Individual Therapy  Location of Patient: Work (private location) Location of Provider: Provider's home (private office) Type of Contact: Telepsychological Visit via MyChart Video Visit  Informed Consent: This provider called Alisha Chen at 12:02pm as she did not present for today's appointment. Assistance on connecting was provided. As such, today's appointment was initiated 11 minutes late.Prior to proceeding with today's appointment, two pieces of identifying information were obtained. In addition, Alisha Chen physical location at the time of this appointment was obtained as well a phone number she could be reached at in the event of technical difficulties. [name] and this provider participated in today's telepsychological service.   The provider's role was explained to Alisha Chen. The provider reviewed and discussed issues of confidentiality, privacy, and limits therein (e.g., reporting obligations). In addition to verbal informed consent, written informed consent for psychological services was obtained prior to the initial appointment. Since the clinic is not a 24/7 crisis center, mental health emergency resources were shared and this  provider explained MyChart, e-mail, voicemail, and/or other messaging systems should be utilized only for non-emergency reasons. This provider also explained that information obtained during appointments will be placed in [name]'s medical record and relevant information will be shared with other providers at [facility] for coordination of care. Alisha Chen agreed information may be shared with other [facility] providers as needed for coordination of care and by signing the service agreement document, she provided written consent for coordination of care. Prior to  initiating telepsychological services, Alisha Chen completed an informed consent document, which included the development of a safety plan (i.e., an emergency contact and emergency resources) in the event of an emergency/crisis. Alisha Chen verbally acknowledged understanding she is ultimately responsible for understanding her insurance benefits for telepsychological and in-person services. This provider also reviewed confidentiality, as it relates to telepsychological services, as well as the rationale for telepsychological services (i.e., to reduce exposure risk to COVID-19). Alisha Chen  acknowledged understanding that appointments cannot be recorded without both party consent and she is aware she is responsible for securing confidentiality on her end of the session. Alisha Chen verbally consented to proceed.  Chief Complaint/HPI: Alisha Chen was referred by [facility], FNP-C due to  eating disorder, unspecified type . Per the note for the visit with [name], FNP-C on December 08, 2021, "Alisha Chen states she self sabotages. She will lose weight but then regaining before visit." Alisha Chen started with the clinic on December 10, 2018.   During today's appointment, Alisha Chen reported, "I don't know if it is emotional or if it is habitual or robotic." She described the frequency of the aforementioned as "at least three times a week or more." Alisha Chen described the onset of emotional eating behaviors as "years" ago. She was verbally administered a questionnaire assessing various behaviors related to emotional eating behaviors. Arriyah endorsed the following: experience food cravings on a regular basis and eat as a reward. She shared she craves potato chips and nuts, noting occasional sweet cravings (e.g., ice cream). In addition, Alisha Chen denied a history of binge eating behaviors. Alisha Chen denied a history of significantly restricting food intake, purging and engagement in other compensatory strategies, and has never been diagnosed with an eating disorder.  She also denied a history of treatment for emotional eating. Furthermore, Alisha Chen reported the weekends are the most challenging when  it comes to her eating habits.  Mental Status Examination:  Appearance: well groomed and appropriate hygiene  Behavior: appropriate to circumstances Mood: neutral Affect: mood congruent Speech: WNL Eye Contact: appropriate Psychomotor Activity: WNL Gait: unable to assess  Thought Process: linear, logical, and goal directed and denies suicidal, homicidal, and self-harm ideation, plan and intent  Thought Content/Perception: no hallucinations, delusions, bizarre thinking or behavior endorsed or observed Orientation: AAOx4 Memory/Concentration: memory, attention, language, and fund of knowledge intact  Insight/Judgment: fair  Family & Psychosocial History: Alisha Chen reported she is married and she has one son (age 73). 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 Additionally, Alisha Chen shared her highest level of education obtained is a BA degree. Currently, Alisha Chen's social support system consists of her husband and church family, adding she is very "close" to "everything [her] church entails." Moreover, Alisha Chen stated she resides with her husband and son.   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,dcis   History of chemotherapy 03/23/12- 05/10/12   s/p 4 cycles of FEC    Hx: UTI (urinary tract infection)    Knee pain    Lactose intolerance    Neck pain    S/P radiation therapy 07/10/12-08/23/12   Left Breast: 50 Gy/25 Fractions; Boost: 10 Gy/5 Fractions   Status post chemotherapy 1 dose 05/23/12   Stopped after one cycle of Taxol due to Grade 2 Toxicity   Status post chemotherapy 1 dose 05/23/12   Stopped after one cycle of Taxol due to Grade 2 Toxicity   Past Surgical History:  Procedure  Laterality Date   AXILLARY SURGERY  03/06/12   left, regional resection, 1/3 nodes pos   BACK SURGERY     LUMBAR DECOMPRESSION   BREAST BIOPSY  01/09/2012   left breast 3 0'clock/ER?PR =positive,her 2 neg   BREAST LUMPECTOMY Left 02/10/12   Left Breast: 2 Foci of Invasive Ductal Caand High grade Ductal Carcinoma In Situ, 0/14 nodes Left Axilla Negative : Regional Resection of Lymph Nodes: 0/5 Nodes Negative   FOOT SURGERY     multiple   INTRAUTERINE DEVICE REMOVAL  01/24/12   needle core Biospy  01/09/12   Left Breast: Invasive Ductal Carcinoma, Lymph Node Axilla: Metastatic Mammary Carcinoma   PORTACATH PLACEMENT  02/10/2012   Procedure: INSERTION PORT-A-CATH;  Surgeon: Rolm Bookbinder, MD;  Location: [facility];  Service: General;  Laterality: Right;   Regional Resection  03/06/12   Left Axilla: 1/3 Nodes Metastatic Carcinoma   SPINE SURGERY  01/2011   Current Outpatient Medications on File Prior to Visit  Medication Sig Dispense Refill   b complex vitamins tablet Take 1 tablet by mouth daily.     cholecalciferol (VITAMIN D) 1000 UNITS tablet Take 1,000 Units by mouth daily.     fluticasone (FLONASE) 50 MCG/ACT nasal spray Place 2 sprays into both nostrils daily. 16 g 6   levocetirizine (XYZAL) 5 MG tablet Take 1 tablet (5 mg total) by mouth every evening. 30 tablet 5   levothyroxine (SYNTHROID) 50 MCG tablet TAKE 1 TABLET BY MOUTH EVERY DAY BEFORE BREAKFAST 90 tablet 1   methocarbamol (ROBAXIN) 500 MG tablet Take 1 tablet (500 mg total) by mouth 2 (two) times daily. 10 tablet 0   No current facility-administered medications on file prior to  visit.  Medication compliant.   Mental Health History: Alisha Chen reported she attended therapeutic services "many, many moons ago," noting it was "short lived." She denied a history of psychotropic medications. Alisha Chen reported there is no history of hospitalizations for psychiatric concerns. Alisha Chen reported her mother "suffers with mental  health issues." Alisha Chen reported there is no history of trauma including psychological, physical , and sexual abuse, as well as neglect.   Alisha Chen described her typical mood lately as "okay" and "good." Alisha Chen denied current alcohol use. She denied tobacco use. She denied illicit/recreational substance use. She also denied caffeine intake. Furthermore, Alisha Chen indicated she is not experiencing the following: hallucinations and delusions, paranoia, symptoms of mania , social withdrawal, crying spells, panic attacks, memory concerns, attention and concentration issues, and obsessions and compulsions. She also denied history of and current suicidal ideation, plan, and intent; history of and current homicidal ideation, plan, and intent; and history of and current engagement in self-harm. Notably, Toluwanimi endorsed item 9 (i.e., "Do you feel that your weight problem is so hopeless that sometimes life doesn't seem worth living?") on the modified PHQ-9 during her initial appointment with Dr. Jearld Lesch on December 10, 2018. She clarified she endorsed the item due to feeling "tired of the roller coaster" of weight loss and not due to feeling hopeless or due to experiencing suicidal ideation.   The following strengths were reported by Alisha Chen: good communicator, good listener, caring, and organized. The following strengths were observed by this provider: ability to express thoughts and feelings during the therapeutic session, ability to establish and benefit from a therapeutic relationship, willingness to work toward established goal(s) with the clinic and ability to engage in reciprocal conversation.   Legal History: Kelley reported there is no history of legal involvement.   Structured Assessments Results: The Patient Health Questionnaire-9 (PHQ-9) is a self-report measure that assesses symptoms and severity of depression over the course of the last two weeks. Annisa obtained a score of 0. [0= Not at all; 1= Several days; 2=  More than half the days; 3= Nearly every day] Little interest or pleasure in doing things 0  Feeling down, depressed, or hopeless 0  Trouble falling or staying asleep, or sleeping too much 0  Feeling tired or having little energy 0  Poor appetite or overeating 0  Feeling bad about yourself --- or that you are a failure or have let yourself or your family down 0  Trouble concentrating on things, such as reading the newspaper or watching television 0  Moving or speaking so slowly that other people could have noticed? Or the opposite --- being so fidgety or restless that you have been moving around a lot more than usual 0  Thoughts that you would be better off dead or hurting yourself in some way 0  PHQ-9 Score 0    The Generalized Anxiety Disorder-7 (GAD-7) is a brief self-report measure that assesses symptoms of anxiety over the course of the last two weeks. Pallie obtained a score of 0. [0= Not at all; 1= Several days; 2= Over half the days; 3= Nearly every day] Feeling nervous, anxious, on edge 0  Not being able to stop or control worrying 0  Worrying too much about different things 0  Trouble relaxing 0  Being so restless that it's hard to sit still 0  Becoming easily annoyed or irritable 0  Feeling afraid as if something awful might happen 0  GAD-7 Score 0   Interventions:  Conducted a chart  review Focused on rapport building Verbally administered PHQ-9 and GAD-7 for symptom monitoring Verbally administered Food & Mood questionnaire to assess various behaviors related to emotional eating Provided emphatic reflections and validation Collaborated with patient on a treatment goal  Psychoeducation provided regarding physical versus emotional hunger  Provisional DSM-5 Diagnosis(es): F50.89 Other Specified Feeding or Eating Disorder, Emotional Eating Behaviors  Plan: Shalece appears able and willing to participate as evidenced by collaboration on a treatment goal, engagement in reciprocal  conversation, and asking questions as needed for clarification. The next appointment will be scheduled in two weeks, which will be via MyChart Video Visit. The following treatment goal was established: increase coping skills. This provider will regularly review the treatment plan and medical chart to keep informed of status changes. Akaiya expressed understanding and agreement with the initial treatment plan of care. Avaeh will be sent a handout via e-mail to utilize between now and the next appointment to increase awareness of hunger patterns and subsequent eating. Gizella provided verbal consent during today's appointment for this provider to send the handout via e-mail.

## 2021-12-13 ENCOUNTER — Other Ambulatory Visit: Payer: Self-pay

## 2021-12-13 ENCOUNTER — Encounter: Payer: BC Managed Care – PPO | Admitting: Adult Health

## 2021-12-13 ENCOUNTER — Other Ambulatory Visit: Payer: Self-pay | Admitting: *Deleted

## 2021-12-13 ENCOUNTER — Encounter: Payer: Self-pay | Admitting: Adult Health

## 2021-12-13 ENCOUNTER — Inpatient Hospital Stay: Payer: No Typology Code available for payment source | Attending: Adult Health | Admitting: Adult Health

## 2021-12-13 VITALS — BP 128/71 | HR 88 | Temp 97.5°F | Resp 18 | Ht 67.0 in | Wt 203.4 lb

## 2021-12-13 DIAGNOSIS — Z79899 Other long term (current) drug therapy: Secondary | ICD-10-CM | POA: Diagnosis not present

## 2021-12-13 DIAGNOSIS — Z170 Estrogen receptor positive status [ER+]: Secondary | ICD-10-CM | POA: Diagnosis not present

## 2021-12-13 DIAGNOSIS — C50412 Malignant neoplasm of upper-outer quadrant of left female breast: Secondary | ICD-10-CM | POA: Diagnosis present

## 2021-12-13 DIAGNOSIS — Z87440 Personal history of urinary (tract) infections: Secondary | ICD-10-CM | POA: Insufficient documentation

## 2021-12-13 DIAGNOSIS — Z8349 Family history of other endocrine, nutritional and metabolic diseases: Secondary | ICD-10-CM | POA: Diagnosis not present

## 2021-12-13 DIAGNOSIS — Z923 Personal history of irradiation: Secondary | ICD-10-CM | POA: Insufficient documentation

## 2021-12-13 DIAGNOSIS — Z833 Family history of diabetes mellitus: Secondary | ICD-10-CM | POA: Insufficient documentation

## 2021-12-13 DIAGNOSIS — Z818 Family history of other mental and behavioral disorders: Secondary | ICD-10-CM | POA: Diagnosis not present

## 2021-12-13 DIAGNOSIS — Z880 Allergy status to penicillin: Secondary | ICD-10-CM | POA: Insufficient documentation

## 2021-12-13 DIAGNOSIS — Z836 Family history of other diseases of the respiratory system: Secondary | ICD-10-CM | POA: Insufficient documentation
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
## 2021-12-14 ENCOUNTER — Telehealth: Payer: Self-pay | Admitting: Adult Health

## 2021-12-14 NOTE — Telephone Encounter (Signed)
Scheduled appointment per 1/23 los. Patient is aware. 

## 2021-12-17 ENCOUNTER — Other Ambulatory Visit: Payer: BC Managed Care – PPO

## 2021-12-17 ENCOUNTER — Inpatient Hospital Stay: Admission: RE | Admit: 2021-12-17 | Payer: BC Managed Care – PPO | Source: Ambulatory Visit

## 2021-12-20 ENCOUNTER — Telehealth (INDEPENDENT_AMBULATORY_CARE_PROVIDER_SITE_OTHER): Payer: BC Managed Care – PPO | Admitting: Psychology

## 2021-12-20 DIAGNOSIS — F5089 Other specified eating disorder: Secondary | ICD-10-CM | POA: Diagnosis not present

## 2021-12-20 NOTE — Progress Notes (Signed)
°  Office: [phone]  /  Fax: [phone]    Date: January 03, 2022   Appointment Start Time: 4:36pm Duration: 25 minutes Provider: Glennie Isle, Psy.D. Type of Session: Individual Therapy  Location of Patient: Work (private location) Location of Provider: [employer] (private office) Type of Contact: Telepsychological Visit via MyChart Video Visit  Session Content: This provider called Alisha Chen at 4:34pm as she did not present for today's appointment. Assistance on connecting was provided. As such, today's appointment was initiated 6 minutes late.Alisha Chen is a 56 y.o. female presenting for a follow-up appointment to address the previously established treatment goal of increasing coping skills.Today's appointment was a telepsychological visit due to COVID-19. Alisha Chen provided verbal consent for today's telepsychological appointment and she is aware she is responsible for securing confidentiality on her end of the session. Prior to proceeding with today's appointment, Alisha Chen's physical location at the time of this appointment was obtained as well a phone number she could be reached at in the event of technical difficulties. Alisha Chen and this provider participated in today's telepsychological service.   This provider conducted a brief check-in. Alisha Chen discussed "thinking" and "pondering" as well as "putting forth effort" as it relates to her eating habits. Reviewed emotional and physical hunger patterns. She shared she prefers to view it as cravings versus necessity. Female observed she often experiences cravings at night. Psychoeducation regarding mindfulness was provided to assist with coping. A handout was provided to [facility] with further information regarding mindfulness, including exercises. This provider also explained the benefit of mindfulness as it relates to emotional eating. Alisha Chen was encouraged to engage in the provided exercises between now and the next appointment with this provider. Alisha Chen  agreed. During today's appointment, Alisha Chen was led through a mindfulness exercise involving her senses. Alisha Chen provided verbal consent during today's appointment for this provider to send a handout about mindfulness via e-mail. Overall, Alisha Chen was receptive to today's appointment as evidenced by openness to sharing, responsiveness to feedback, and willingness to engage in mindfulness exercises to assist with coping.  Mental Status Examination:  Appearance: neat Behavior: appropriate to circumstances Mood: neutral Affect: mood congruent Speech: WNL Eye Contact: appropriate Psychomotor Activity: WNL Gait: unable to assess Thought Process: linear, logical, and goal directed and no evidence or endorsement of suicidal, homicidal, and self-harm ideation, plan and intent  Thought Content/Perception: no hallucinations, delusions, bizarre thinking or behavior endorsed or observed Orientation: AAOx4 Memory/Concentration: memory, attention, language, and fund of knowledge intact  Insight: fair Judgment: fair  Interventions:  Conducted a brief chart review Provided empathic reflections and validation Reviewed content from the previous session Employed supportive psychotherapy interventions to facilitate reduced distress and to improve coping skills with identified stressors Psychoeducation provided regarding mindfulness Engaged patient in mindfulness exercise(s)  DSM-5 Diagnosis(es): F50.89 Other Specified Feeding or Eating Disorder, Emotional Eating Behaviors  Treatment Goal & Progress: During the initial appointment with this provider, the following treatment goal was established: increase coping skills. Progress is limited, as Alisha Chen has just begun treatment with this provider; however, she is receptive to the interaction and interventions and rapport is being established.   Plan: Based on appointment availability and Alisha Chen's schedule, the next appointment will be scheduled in three weeks, which  will be via MyChart Video Visit. The next session will focus on working towards the established treatment goal.

## 2021-12-28 ENCOUNTER — Other Ambulatory Visit: Payer: Self-pay

## 2021-12-28 ENCOUNTER — Ambulatory Visit
Admission: RE | Admit: 2021-12-28 | Discharge: 2021-12-28 | Disposition: A | Discharge status: Home / Self Care | Payer: BC Managed Care – PPO | Source: Ambulatory Visit | Attending: Adult Health | Admitting: Adult Health

## 2021-12-28 ENCOUNTER — Ambulatory Visit
Admission: RE | Admit: 2021-12-28 | Discharge: 2021-12-28 | Disposition: A | Discharge status: Home / Self Care | Payer: No Typology Code available for payment source | Source: Ambulatory Visit | Attending: Adult Health | Admitting: Adult Health

## 2021-12-28 DIAGNOSIS — C50412 Malignant neoplasm of upper-outer quadrant of left female breast: Secondary | ICD-10-CM | POA: Insufficient documentation

## 2021-12-31 ENCOUNTER — Other Ambulatory Visit: Payer: BC Managed Care – PPO

## 2022-01-03 ENCOUNTER — Telehealth (INDEPENDENT_AMBULATORY_CARE_PROVIDER_SITE_OTHER): Payer: BC Managed Care – PPO | Admitting: Psychology

## 2022-01-03 DIAGNOSIS — F5089 Other specified eating disorder: Secondary | ICD-10-CM

## 2022-01-05 ENCOUNTER — Encounter (INDEPENDENT_AMBULATORY_CARE_PROVIDER_SITE_OTHER): Payer: Self-pay | Admitting: Family Medicine

## 2022-01-05 ENCOUNTER — Ambulatory Visit (INDEPENDENT_AMBULATORY_CARE_PROVIDER_SITE_OTHER): Payer: BC Managed Care – PPO | Admitting: Family Medicine

## 2022-01-05 ENCOUNTER — Other Ambulatory Visit: Payer: Self-pay

## 2022-01-05 VITALS — BP 109/72 | HR 65 | Temp 98.0°F | Ht 66.0 in | Wt 198.0 lb

## 2022-01-05 DIAGNOSIS — Z6832 Body mass index (BMI) 32.0-32.9, adult: Secondary | ICD-10-CM

## 2022-01-05 DIAGNOSIS — E669 Obesity, unspecified: Secondary | ICD-10-CM

## 2022-01-05 DIAGNOSIS — Z6831 Body mass index (BMI) 31.0-31.9, adult: Secondary | ICD-10-CM | POA: Diagnosis not present

## 2022-01-05 DIAGNOSIS — E88819 Insulin resistance, unspecified: Secondary | ICD-10-CM

## 2022-01-05 DIAGNOSIS — E8881 Metabolic syndrome: Secondary | ICD-10-CM

## 2022-01-05 NOTE — Progress Notes (Signed)
Chief Complaint:   OBESITY Samia is here to discuss her progress with her obesity treatment plan along with follow-up of her obesity related diagnoses. Azariah is on keeping a food journal and adhering to recommended goals of 1150-1250 calories and 80 grams of protein and following a lower carbohydrate, vegetable and lean protein rich diet plan and states she is following her eating plan approximately 50% of the time. Janella states she is doing walking aerobics for 30 minutes 3 times per week.  Today's visit was #: 24 Starting weight: 201 lbs Starting date: 12/10/2018 Today's weight: 198 lbs Today's date: 01/05/2022 Total lbs lost to date: 3 lbs Total lbs lost since last in-office visit: 1 lb  Interim History: Alisha Chen is journaling consistently and meeting calorie and protein goals. She is doing better with protein intake. She sometimes eats out for lunch but has also been eating some pre-packaged salads. She has reduced eating out.  She often skips breakfast.  Subjective:   1. Insulin resistance Enedina denies polyphagia. She has been on Metformin and Saxenda in the past but had side effects.  Lab Results  Component Value Date   HGBA1C 4.8 07/14/2021   Lab Results  Component Value Date   INSULIN 12.6 07/14/2021   INSULIN 11.8 11/10/2020   INSULIN 13.5 05/27/2020   INSULIN 16.6 12/10/2018    Assessment/Plan:   1. Insulin resistance Rayvn will continue meal plan and exercise.   2. Obesity: BMI 31.97 Laverda is currently in the action stage of change. As such, her goal is to continue with weight loss efforts. She has agreed to keeping a food journal and adhering to recommended goals of 1150-1250 calories and 80 grams of protein.   Exercise goals:  As is.  Behavioral modification strategies: decreasing eating out, meal planning and cooking strategies, and keeping a strict food journal.  Estephanie has agreed to follow-up with our clinic in 4 weeks (virtual).  Objective:   Blood  pressure 109/72, pulse 65, temperature 98 F (36.7 C), height 5\' 6"  (1.676 m), weight 198 lb (89.8 kg), last menstrual period 02/27/2012, SpO2 100 %. Body mass index is 31.96 kg/m.  General: Cooperative, alert, well developed, in no acute distress. HEENT: Conjunctivae and lids unremarkable. Cardiovascular: Regular rhythm.  Lungs: Normal work of breathing. Neurologic: No focal deficits.   Lab Results  Component Value Date   CREATININE 0.82 07/14/2021   BUN 17 07/14/2021   NA 139 07/14/2021   K 5.0 07/14/2021   CL 104 07/14/2021   CO2 25 07/14/2021   Lab Results  Component Value Date   ALT 17 07/14/2021   AST 15 07/14/2021   ALKPHOS 112 07/14/2021   BILITOT 0.3 07/14/2021   Lab Results  Component Value Date   HGBA1C 4.8 07/14/2021   HGBA1C 5.0 11/10/2020   HGBA1C 5.0 05/27/2020   HGBA1C 4.8 10/21/2019   HGBA1C 4.9 12/10/2018   Lab Results  Component Value Date   INSULIN 12.6 07/14/2021   INSULIN 11.8 11/10/2020   INSULIN 13.5 05/27/2020   INSULIN 16.6 12/10/2018   Lab Results  Component Value Date   TSH 2.550 07/14/2021   Lab Results  Component Value Date   CHOL 204 (H) 07/14/2021   HDL 51 07/14/2021   LDLCALC 145 (H) 07/14/2021   TRIG 45 07/14/2021   CHOLHDL 4.0 10/21/2019   Lab Results  Component Value Date   VD25OH 57.5 07/14/2021   VD25OH 53.6 11/10/2020   VD25OH 54.1 05/27/2020   Lab Results  Component Value Date   WBC 4.3 05/27/2020   HGB 12.2 05/27/2020   HCT 38.8 05/27/2020   MCV 89 05/27/2020   PLT 227 05/27/2020   No results found for: IRON, TIBC, FERRITIN  Attestation Statements:   Reviewed by clinician on day of visit: allergies, medications, problem list, medical history, surgical history, family history, social history, and previous encounter notes.  I, Lizbeth Bark, RMA, am acting as Location manager for [employer], [place].  I have reviewed the above documentation for accuracy and completeness, and I agree with the above. -  Georgianne Fick, FNP

## 2022-01-06 ENCOUNTER — Encounter (INDEPENDENT_AMBULATORY_CARE_PROVIDER_SITE_OTHER): Payer: Self-pay | Admitting: Family Medicine

## 2022-01-10 NOTE — Progress Notes (Signed)
Entered in error

## 2022-01-24 ENCOUNTER — Encounter: Payer: Self-pay | Admitting: Family Medicine

## 2022-01-24 ENCOUNTER — Encounter (INDEPENDENT_AMBULATORY_CARE_PROVIDER_SITE_OTHER): Payer: Self-pay

## 2022-01-24 ENCOUNTER — Ambulatory Visit (INDEPENDENT_AMBULATORY_CARE_PROVIDER_SITE_OTHER): Payer: BC Managed Care – PPO | Admitting: Family Medicine

## 2022-01-24 ENCOUNTER — Telehealth (INDEPENDENT_AMBULATORY_CARE_PROVIDER_SITE_OTHER): Payer: Self-pay | Admitting: Psychology

## 2022-01-24 ENCOUNTER — Encounter (INDEPENDENT_AMBULATORY_CARE_PROVIDER_SITE_OTHER): Payer: BC Managed Care – PPO | Admitting: Psychology

## 2022-01-24 VITALS — BP 110/80 | HR 82 | Temp 98.7°F | Resp 18 | Ht 66.0 in | Wt 201.4 lb

## 2022-01-24 DIAGNOSIS — S0501XA Injury of conjunctiva and corneal abrasion without foreign body, right eye, initial encounter: Secondary | ICD-10-CM | POA: Diagnosis not present

## 2022-01-24 DIAGNOSIS — H04129 Dry eye syndrome of unspecified lacrimal gland: Secondary | ICD-10-CM | POA: Diagnosis not present

## 2022-01-24 MED ORDER — MOXIFLOXACIN HCL 0.5 % OP SOLN: 1.0000 [drp] | Freq: Three times a day (TID) | OPHTHALMIC | 0 refills | Status: DC

## 2022-01-24 NOTE — Telephone Encounter (Signed)
?  Office: [phone]  /  Fax: [phone] ? ?Date of Encounter: January 24, 2022  ?Time of Encounter: 4:02pm ?Duration of Encounter: ~3 minute(s) ?Provider: Glennie Isle, PsyD ? ?CONTENT: Alisha Chen presented for today's appointment via [place] Visit, but was observed driving. This provider explained due to safety concerns she would have to pull over in a safe location for the duration of today's appointment or reschedule. She was receptive to rescheduling and acknowledged understanding the one time no show fee waiver would be applied to today's appointment. No evidence or endorsement of safety concerns. All questions/concerns addressed.  ? ?PLAN: [name] is scheduled for an appointment on February 07, 2022 at 4:00pm via [place] Visit. ? ?

## 2022-01-24 NOTE — Progress Notes (Unsigned)
°  Office: [phone]  /  Fax: [phone]    Date: 02/07/2022   Appointment Start Time: *** Duration: *** minutes Provider: Glennie Isle, Psy.D. Type of Session: Individual Therapy  Location of Patient: {gbptloc:23249} Location of Provider: Provider's Home (private office) Type of Contact: Telepsychological Visit via MyChart Video Visit  Session Content: Alisha Chen is a 56 y.o. female presenting for a follow-up appointment to address the previously established treatment goal of increasing coping skills.Today's appointment was a telepsychological visit due to COVID-19. Alisha Chen provided verbal consent for today's telepsychological appointment and she is aware she is responsible for securing confidentiality on her end of the session. Prior to proceeding with today's appointment, Alisha Chen's physical location at the time of this appointment was obtained as well a phone number she could be reached at in the event of technical difficulties. Alisha Chen and this provider participated in today's telepsychological service.   This provider conducted a brief check-in. *** Alisha Chen was receptive to today's appointment as evidenced by openness to sharing, responsiveness to feedback, and {gbreceptiveness:23401}.  Mental Status Examination:  Appearance: {Appearance:22431} Behavior: {Behavior:22445} Mood: {gbmood:21757} Affect: {Affect:22436} Speech: {Speech:22432} Eye Contact: {Eye Contact:22433} Psychomotor Activity: {Motor Activity:22434} Gait: {gbgait:23404} Thought Process: {thought process:22448}  Thought Content/Perception: {disturbances:22451} Orientation: {Orientation:22437} Memory/Concentration: {gbcognition:22449} Insight: {Insight:22446} Judgment: {Insight:22446}  Interventions:  {Interventions for Progress Notes:23405}  DSM-5 Diagnosis(es): F50.89 Other Specified Feeding or Eating Disorder, Emotional Eating Behaviors  Treatment Goal & Progress: During the initial appointment with this provider, the  following treatment goal was established: increase coping skills. Alisha Chen has demonstrated progress in her goal as evidenced by {gbtxprogress:22839}. Alisha Chen also {gbtxprogress2:22951}.  Plan: The next appointment will be scheduled in {gbweeks:21758}, which will be {gbtxmodality:23402}. The next session will focus on {Plan for Next Appointment:23400}.

## 2022-01-24 NOTE — Assessment & Plan Note (Signed)
Use refresh / systane ?F/u opth  ?

## 2022-01-24 NOTE — Progress Notes (Signed)
Subjective:   By signing my name below, I, Alisha Alisha Chen, attest that this documentation has been prepared under the direction and in the presence of Alisha Schanz DO, 01/24/2022   Patient ID: Alisha Alisha Chen, female    DOB: 10/12/1966, 56 y.o.   MRN: [id number]  Chief Complaint  Patient presents with   Eye Problem    Right eye, x1 week, no discharge or pain, some pressure and redness    HPI Patient is in today for an office visit.  Patient complains of right eye pain. She states migraine symptoms begun two weeks ago. On 01/23/2022, she noticed her eye was red and there was an inconstant pressure behind it. The eye is uncomfortable with heat. She states that she uses Lumify eye drops to alleviate pain. She is recommended to take Systane eye drops. 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,dcis   History of chemotherapy 03/23/12- 05/10/12   s/p 4 cycles of FEC    Hx: UTI (urinary tract infection)    Knee pain    Lactose intolerance    Neck pain    S/P radiation therapy 07/10/12-08/23/12   Left Breast: 50 Gy/25 Fractions; Boost: 10 Gy/5 Fractions   Status post chemotherapy 1 dose 05/23/12   Stopped after one cycle of Taxol due to Grade 2 Toxicity   Status post chemotherapy 1 dose 05/23/12   Stopped after one cycle of Taxol due to Grade 2 Toxicity    Past Surgical History:  Procedure Laterality Date   AXILLARY SURGERY  03/06/12   left, regional resection, 1/3 nodes pos   BACK SURGERY     LUMBAR DECOMPRESSION   BREAST BIOPSY  01/09/2012   left breast 3 0'clock/ER?PR =positive,her 2 neg   BREAST LUMPECTOMY Left 02/10/12   Left Breast: 2 Foci of Invasive Ductal Caand High grade Ductal Carcinoma In Situ, 0/14 nodes Left Axilla Negative : Regional Resection of Lymph Nodes: 0/5 Nodes Negative   FOOT  SURGERY     multiple   INTRAUTERINE DEVICE REMOVAL  01/24/12   needle core Biospy  01/09/12   Left Breast: Invasive Ductal Carcinoma, Lymph Node Axilla: Metastatic Mammary Carcinoma   PORTACATH PLACEMENT  02/10/2012   Procedure: INSERTION PORT-A-CATH;  Surgeon: Rolm Bookbinder, MD;  Location: [facility];  Service: General;  Laterality: Right;   Regional Resection  03/06/12   Left Axilla: 1/3 Nodes Metastatic Carcinoma   SPINE SURGERY  01/2011    Family History  Problem Relation Age of Onset   Diabetes Mother    Sudden death Mother    Schizophrenia Mother    Sleep apnea Mother    Obesity Mother    Seizures Son     Social History   Socioeconomic History   Marital status: Married    Spouse name: Alisha Alisha Chen   Number of children: 1   Years of education: Not on file   Highest education level: Not on file  Occupational History   Occupation: Pharmacist, hospital, Art gallery manager    Employer: [employer]    Comment: [employer] scientific officer: [place]  Tobacco Use   Smoking status: Never   Smokeless tobacco: Never  Vaping Use   Vaping Use: Never used  Substance and Sexual Activity   Alcohol  use: No   Drug use: No   Sexual activity: Yes    Partners: Male    Birth control/protection: None    Comment: menarche age 66,premenopausa; G!P1,1st pregnancy age 58  Other Topics Concern   Not on file  Social History Narrative   Married x 21 years. Production assistant, radio at [employer]. 27 ten year old son   Social Determinants of Radio broadcast assistant Strain: Not on file  Food Insecurity: Not on file  Transportation Needs: Not on file  Physical Activity: Not on file  Stress: Not on file  Social Connections: Not on file  Intimate Partner Violence: Not on file    Outpatient Medications Prior to Visit  Medication Sig Dispense Refill   b complex vitamins tablet Take 1 tablet by mouth daily.     cholecalciferol (VITAMIN D) 1000 UNITS tablet Take 1,000 Units by mouth  daily.     fluticasone (FLONASE) 50 MCG/ACT nasal spray Place 2 sprays into both nostrils daily. 16 g 6   levocetirizine (XYZAL) 5 MG tablet Take 1 tablet (5 mg total) by mouth every evening. 30 tablet 5   levothyroxine (SYNTHROID) 50 MCG tablet TAKE 1 TABLET BY MOUTH EVERY DAY BEFORE BREAKFAST 90 tablet 1   methocarbamol (ROBAXIN) 500 MG tablet Take 1 tablet (500 mg total) by mouth 2 (two) times daily. 10 tablet 0   No facility-administered medications prior to visit.    Allergies  Allergen Reactions   Penicillins Other (See Comments)    Not sure, but was told by mother never to take this medication as a child   Shellfish-Derived Products Other (See Comments)    Welts   Clarithromycin Other (See Comments)    REACTION: NAUSEA,WEAK,BITTER TASTE   Ondansetron Nausea And Vomiting    Headaches    Review of Systems  Eyes:  Positive for pain and redness. Negative for discharge.       (-) Disturbed Eye Vision      Objective:    Physical Exam Constitutional:      General: She is not in acute distress.    Appearance: Normal appearance. She is not ill-appearing.  HENT:     Head: Normocephalic and atraumatic.     Right Ear: External ear normal.     Left Ear: External ear normal.  Eyes:     General:        Right eye: No discharge.     Extraocular Movements: Extraocular movements intact.     Pupils: Pupils are equal, round, and reactive to light.     Right eye: Corneal abrasion and fluorescein uptake present.     Slit lamp exam:    Right eye: Anterior chamber quiet.     Left eye: Anterior chamber quiet.     Comments: (+) Dryness in right eye  Cardiovascular:     Rate and Rhythm: Normal rate and regular rhythm.     Heart sounds: Normal heart sounds. No murmur heard.   No gallop.  Pulmonary:     Effort: Pulmonary effort is normal. No respiratory distress.     Breath sounds: Normal breath sounds. No wheezing or rales.  Skin:    General: Skin is warm and dry.  Neurological:      Mental Status: She is alert and oriented to person, place, and time.  Psychiatric:        Judgment: Judgment normal.    BP 110/80 (BP Location: Right Arm, Patient Position: Sitting, Cuff Size: Large)  Pulse 82    Temp 98.7 F (37.1 C) (Oral)    Resp 18    Ht _0  (1.676 m)    Wt 201 lb 6.4 oz (91.4 kg)    LMP 02/27/2012    SpO2 97%    BMI 32.51 kg/m  Wt Readings from Last 3 Encounters:  01/24/22 201 lb 6.4 oz (91.4 kg)  01/05/22 198 lb (89.8 kg)  12/13/21 203 lb 6 oz (92.3 kg)    Diabetic Foot Exam - Simple   No data filed    Lab Results  Component Value Date   WBC 4.3 05/27/2020   HGB 12.2 05/27/2020   HCT 38.8 05/27/2020   PLT 227 05/27/2020   GLUCOSE 78 07/14/2021   CHOL 204 (H) 07/14/2021   TRIG 45 07/14/2021   HDL 51 07/14/2021   LDLCALC 145 (H) 07/14/2021   ALT 17 07/14/2021   AST 15 07/14/2021   NA 139 07/14/2021   K 5.0 07/14/2021   CL 104 07/14/2021   CREATININE 0.82 07/14/2021   BUN 17 07/14/2021   CO2 25 07/14/2021   TSH 2.550 07/14/2021   INR 1.04 02/10/2011   HGBA1C 4.8 07/14/2021    Lab Results  Component Value Date   TSH 2.550 07/14/2021   Lab Results  Component Value Date   WBC 4.3 05/27/2020   HGB 12.2 05/27/2020   HCT 38.8 05/27/2020   MCV 89 05/27/2020   PLT 227 05/27/2020   Lab Results  Component Value Date   NA 139 07/14/2021   K 5.0 07/14/2021   CHLORIDE 105 12/25/2014   CO2 25 07/14/2021   GLUCOSE 78 07/14/2021   BUN 17 07/14/2021   CREATININE 0.82 07/14/2021   BILITOT 0.3 07/14/2021   ALKPHOS 112 07/14/2021   AST 15 07/14/2021   ALT 17 07/14/2021   PROT 6.8 07/14/2021   ALBUMIN 4.5 07/14/2021   CALCIUM 10.6 (H) 07/14/2021   ANIONGAP 8 12/25/2014   EGFR 84 07/14/2021   GFR 76.80 12/25/2019   Lab Results  Component Value Date   CHOL 204 (H) 07/14/2021   Lab Results  Component Value Date   HDL 51 07/14/2021   Lab Results  Component Value Date   LDLCALC 145 (H) 07/14/2021   Lab Results  Component Value Date    TRIG 45 07/14/2021   Lab Results  Component Value Date   CHOLHDL 4.0 10/21/2019   Lab Results  Component Value Date   HGBA1C 4.8 07/14/2021       Assessment & Plan:   Problem List Items Addressed This Visit       Unprioritized   Dry eye    Use refresh / systane F/u opth       Relevant Orders   Ambulatory referral to Ophthalmology   Right corneal abrasion - Primary    vigamox gtts F/u optho if no improvement in 2-3 days       Relevant Medications   moxifloxacin (VIGAMOX) 0.5 % ophthalmic solution   Other Relevant Orders   Ambulatory referral to Ophthalmology      Meds ordered this encounter  Medications   moxifloxacin (VIGAMOX) 0.5 % ophthalmic solution    Sig: Place 1 drop into the right eye 3 (three) times daily.    Dispense:  3 mL    Refill:  0    I, [name], DO, personally preformed the services described in this documentation.  All medical record entries made by the scribe were at my direction  and in my presence.  I have reviewed the chart and discharge instructions (if applicable) and agree that the record reflects my personal performance and is accurate and complete. 01/24/2022   I,Amber Collins,acting as a scribe for [name], DO.,have documented all relevant documentation on the behalf of [name], DO,as directed by  [name], DO while in the presence of [name], DO.    [name], DO

## 2022-01-24 NOTE — Assessment & Plan Note (Signed)
vigamox gtts ?F/u optho if no improvement in 2-3 days  ?

## 2022-01-24 NOTE — Patient Instructions (Signed)
Corneal Abrasion ?A corneal abrasion is a scratch or injury to the clear covering over the front of the eye (cornea). Your cornea forms a clear dome that protects your eye and helps to focus your vision. Your cornea is made up of many layers, but the surface layer is one of the most sensitive tissues in your body. A corneal abrasion can be very painful. ?If a corneal abrasion is not treated, it can become infected and cause an ulcer. This can lead to scarring. A scarred cornea can affect your vision. Sometimes abrasions come back in the same area, even after the original injury has healed. ?What are the causes? ?This condition may be caused by: ?A poke in the eye. ?A gritty or irritating substance (foreign body) in the eye. ?Excessive eye rubbing. ?Very dry eyes. ?Certain eye infections. ?Contact lenses that fit poorly or are worn for a long period of time. You can also injure your cornea when putting contact lenses in your eye or taking them out. ?Eye surgery. ?Certain cornea problems may increase the chance of a corneal abrasion. ?Sometimes, the cause is not known. ?What are the signs or symptoms? ?Symptoms of this condition include: ?Eye pain. The pain may get worse when you open and close your eye or when you move your eye. ?A feeling of something stuck in your eye. ?Tearing, redness, and sensitivity to light. ?Having trouble keeping your eye open, or not being able to keep it open. ?Blurred vision. ?Headache. ?How is this diagnosed? ?You may work with a health care provider who specializes in diseases and conditions of the eye (ophthalmologist). This condition may be diagnosed based on your medical history, symptoms, and an eye exam. ?Before the eye exam, numbing drops may be put into your eye. You may also have dye put in your eye with a dropper or a small paper strip. The dye makes the abrasion easy to see when your ophthalmologist examines your eye with a light. Your ophthalmologist may look at your eye  through an eye scope (slit lamp). ?How is this treated? ?Treatment may vary depending on the cause of your condition, and it may include: ?Washing out your eye. ?Removing any foreign bodies that are in your eye. ?Using antibiotic drops or ointment to treat or prevent an infection. ?Using a dilating drop to decrease inflammation and pain. ?Using steroid drops or ointment to treat redness, irritation, or inflammation. ?Applying a cold, wet cloth (cold compress) or ice pack to ease the pain. ?Taking pain medicine by mouth (orally). ?In some cases, an eye patch or bandage soft contact lens might also be used. An eye patch should not be used if the corneal abrasion was related to contact lens wear as it can increase the chance of infection in these eyes. ?Follow these instructions at home: ?Medicines ?Use eye drops or ointments as told by your health care provider. ?If you were prescribed antibiotic drops or ointment, use them as told by your health care provider. Do not stop using the antibiotic even if you start to feel better. ?Take over-the-counter and prescription medicines only as told by your health care provider. ?Ask your health care provider if the medicine prescribed to you: ?Requires you to avoid driving or using heavy machinery. ?Can cause constipation. You may need to take these actions to prevent or treat constipation: ?Drink enough fluid to keep your urine pale yellow. ?Take over-the-counter or prescription medicines. ?Eat foods that are high in fiber, such as beans, whole grains, and fresh  fruits and vegetables. ?Limit foods that are high in fat and processed sugars, such as fried or sweet foods. ?Eye patch use ?If you have an eye patch, wear it as told by your health care provider. ?Do not drive or use machinery while wearing an eye patch. Your ability to judge distances will be impaired. ?Follow instructions from your health care provider about when to remove the patch. ?General instructions ?Ask your  health care provider whether you can use a cold compress on your eye to relieve pain. ?Do not rub or touch your eye. Do not wash out your eye. ?Do not wear contact lenses until your health care provider says that this is okay. ?Avoid bright light and eye strain. ?Keep all follow-up visits as told by your health care provider. This is important for preventing infection and vision loss. ?Contact a health care provider if: ?You continue to have eye pain and other symptoms for more than 2 days. ?You have new symptoms, such as worse redness, tearing, or discharge. ?You have discharge that makes your eyelids stick together in the morning. ?Your eye patch becomes so loose that you can blink your eye. ?Symptoms return after the original abrasion has healed. ?Get help right away if: ?You have severe eye pain that does not get better with medicine. ?You have vision loss. ?Summary ?A corneal abrasion is a scratch or injury to the clear covering over the front of the eye (cornea). ?It is important to get treatment for a corneal abrasion. If this problem is not treated, it can affect your vision. ?Use eye drops or ointments as told by your health care provider. ?If you have an eye patch, do not drive or use machinery while wearing it. Your ability to judge distances will be impaired. ?Let your health care provider know if your symptoms continue for more than 2 days. ?This information is not intended to replace advice given to you by your health care provider. Make sure you discuss any questions you have with your health care provider. ?Document Revised: 03/15/2019 Document Reviewed: 03/15/2019 ?Elsevier Patient Education ? [place]. ? ?

## 2022-02-01 NOTE — Progress Notes (Addendum)
?TeleHealth Visit:  ?Due to the COVID-19 pandemic, this visit was completed with telemedicine (audio/video) technology to reduce patient and provider exposure as well as to preserve personal protective equipment.  ? ?Alisha Chen has verbally consented to this TeleHealth visit. The patient is located at home, the provider is located at home. The participants in this visit include the listed provider and patient. The visit was conducted today via MyChart video. ? ?OBESITY ?Alisha Chen is here to discuss her progress with her obesity treatment plan along with follow-up of her obesity related diagnoses.  ? ?Today's date: 02/01/2022 ?Today's visit was #54 ?Starting weight: 201 lbs ?Starting date: 12/10/2018 ?Weight at last in person visit: 198 lbs ?Today's reported weight: 201 lbs  ? ?Interim History: Alisha Chen is unsure why she is not losing weight.  She feels she stays pretty close to her protein and calorie goals. ?Alisha Chen is very consistent with journaling.  She meets protein goals about 4 days/week however she tends to exceed her calorie goal about 4 days/week.  She either skips breakfast or gets a [employer] grilled cheese, or a [place] English muffin sandwich.  For lunch she has been doing prepackaged salads.  For dinner she has a protein shake.  She does tend to snack after dinner. ?She exercises with a group of friends virtually and they do a walking video.  She does not feel that this is challenging enough for her however. ?Alisha Chen is extremely busy.  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  She has virtually no time for meal prep or exercise most of the time. ? ?Nutrition Plan: keeping a food journal and adhering to recommended goals of 1150-1250 calories and 80 gms protein.  ?Hunger is moderately controlled. Cravings are moderately controlled.  ?Current exercise: Walking video a few days per week. ?Assessment/Plan:  ?Insulin Resistance ?Assessment ?Alisha Chen has had elevated fasting insulin  readings. Goal is HgbA1c < 5.7, fasting insulin closer to 5.   ?She reports polyphagia at times.  Medication(s): She has been on metformin and stopped due to dysgeusia.  She generally is not open to starting any new medication. ?Lab Results  ?Component Value Date  ? HGBA1C 4.8 07/14/2021  ? ?Lab Results  ?Component Value Date  ? INSULIN 12.6 07/14/2021  ? INSULIN 11.8 11/10/2020  ? INSULIN 13.5 05/27/2020  ? INSULIN 16.6 12/10/2018  ? ? ?Plan ?Continue meal plan. ? ? ?Obesity: Current BMI 31.97 ?Alisha Chen is currently in the action stage of change. As such, her goal is to continue with weight loss efforts. She has agreed to keeping a food journal and adhering to recommended goals of 1150-1250 calories and 80 gms protein.  ? ?Exercise goals: I encouraged her to walk outdoors at a brisk pace when she can find the time.  We also discussed doing some resistance training with hand weights a few times per week. ? ?Behavioral modification strategies: decreasing simple carbohydrates. ?We discussed that the biscuit Alisha Chen English muffin would be a better choice than [employer] grilled cheese due to the amount of protein. ? ?Alisha Chen has agreed to follow-up with our clinic in 3 weeks.  ? ?No orders of the defined types were placed in this encounter. ? ? ?There are no discontinued medications.  ? ?No orders of the defined types were placed in this encounter. ?   ? ?Objective:  ? ?VITALS: Per patient if applicable, see vitals. ?GENERAL: Alert and in no acute distress. ?CARDIOPULMONARY: No increased WOB. Speaking in clear sentences.  ?  PSYCH: Pleasant and cooperative. Speech normal rate and rhythm. Affect is appropriate. Insight and judgement are appropriate. Attention is focused, linear, and appropriate.  ?NEURO: Oriented as arrived to appointment on time with no prompting.  ? ?Lab Results  ?Component Value Date  ? CREATININE 0.82 07/14/2021  ? BUN 17 07/14/2021  ? NA 139 07/14/2021  ? K 5.0 07/14/2021  ? CL 104 07/14/2021  ? CO2 25  07/14/2021  ? ?Lab Results  ?Component Value Date  ? ALT 17 07/14/2021  ? AST 15 07/14/2021  ? ALKPHOS 112 07/14/2021  ? BILITOT 0.3 07/14/2021  ? ?Lab Results  ?Component Value Date  ? HGBA1C 4.8 07/14/2021  ? HGBA1C 5.0 11/10/2020  ? HGBA1C 5.0 05/27/2020  ? HGBA1C 4.8 10/21/2019  ? HGBA1C 4.9 12/10/2018  ? ?Lab Results  ?Component Value Date  ? INSULIN 12.6 07/14/2021  ? INSULIN 11.8 11/10/2020  ? INSULIN 13.5 05/27/2020  ? INSULIN 16.6 12/10/2018  ? ?Lab Results  ?Component Value Date  ? TSH 2.550 07/14/2021  ? ?Lab Results  ?Component Value Date  ? CHOL 204 (H) 07/14/2021  ? HDL 51 07/14/2021  ? LDLCALC 145 (H) 07/14/2021  ? TRIG 45 07/14/2021  ? CHOLHDL 4.0 10/21/2019  ? ?Lab Results  ?Component Value Date  ? WBC 4.3 05/27/2020  ? HGB 12.2 05/27/2020  ? HCT 38.8 05/27/2020  ? MCV 89 05/27/2020  ? PLT 227 05/27/2020  ? ?No results found for: IRON, TIBC, FERRITIN ?Lab Results  ?Component Value Date  ? VD25OH 57.5 07/14/2021  ? VD25OH 53.6 11/10/2020  ? VD25OH 54.1 05/27/2020  ? ? ?Attestation Statements:  ? ?Reviewed by clinician on day of visit: allergies, medications, problem list, medical history, surgical history, family history, social history, and previous encounter notes. ? ? ? ? ?

## 2022-02-02 ENCOUNTER — Encounter (INDEPENDENT_AMBULATORY_CARE_PROVIDER_SITE_OTHER): Payer: Self-pay | Admitting: Family Medicine

## 2022-02-02 ENCOUNTER — Telehealth (INDEPENDENT_AMBULATORY_CARE_PROVIDER_SITE_OTHER): Payer: BC Managed Care – PPO | Admitting: Family Medicine

## 2022-02-02 DIAGNOSIS — E8881 Metabolic syndrome: Secondary | ICD-10-CM | POA: Diagnosis not present

## 2022-02-02 DIAGNOSIS — Z6831 Body mass index (BMI) 31.0-31.9, adult: Secondary | ICD-10-CM | POA: Diagnosis not present

## 2022-02-02 DIAGNOSIS — E669 Obesity, unspecified: Secondary | ICD-10-CM

## 2022-02-02 DIAGNOSIS — E88819 Insulin resistance, unspecified: Secondary | ICD-10-CM

## 2022-02-07 ENCOUNTER — Telehealth (INDEPENDENT_AMBULATORY_CARE_PROVIDER_SITE_OTHER): Payer: BC Managed Care – PPO | Admitting: Psychology

## 2022-02-07 DIAGNOSIS — F5089 Other specified eating disorder: Secondary | ICD-10-CM | POA: Diagnosis not present

## 2022-02-08 ENCOUNTER — Encounter (INDEPENDENT_AMBULATORY_CARE_PROVIDER_SITE_OTHER): Payer: Self-pay | Admitting: Family Medicine

## 2022-02-08 NOTE — Progress Notes (Signed)
?  Office: [phone]  /  Fax: [phone] ? ? ? ?Date: 02/22/2022   ?Appointment Start Time: 4:01pm ?Duration: 20 minutes ?Provider: Glennie Isle, Psy.D. ?Type of Session: Individual Therapy  ?Location of Patient: Work (private location) ?Location of Provider: Provider's Home (private office) ?Type of Contact: Telepsychological Visit via MyChart Video Visit ? ?Session Content: Alisha Chen is a 56 y.o. female presenting for a follow-up appointment to address the previously established treatment goal of increasing coping skills.Today's appointment was a telepsychological visit due to COVID-19. Alisha Chen provided verbal consent for today's telepsychological appointment and she is aware she is responsible for securing confidentiality on her end of the session. Prior to proceeding with today's appointment, Alisha Chen's physical location at the time of this appointment was obtained as well a phone number she could be reached at in the event of technical difficulties. Alisha Chen and this provider participated in today's telepsychological service.  ? ?This provider conducted a brief check-in. Alisha Chen reported "nothing has changed," adding she is "down a few pounds." She reported regularly engaging in physical activity. Additionally, Alisha Chen discussed an improvement in emotional eating behaviors. Further explored and processed. She noted feeling more in control. Of note, Alisha Chen reported feeling uncomfortable discussing her eating habits, as she feels she should "make up [her] mind" to make the lifestyle change. Further explored and processed. Alisha Chen of session focused further on mindfulness to assist with coping. Alisha Chen was led through a mindfulness exercise (A Taste of Mindfulness) and her experience was processed. Alisha Chen provided verbal consent during today's appointment for this provider to send the handout for today's via e-mail. Overall, Alisha Chen was receptive to today's appointment as evidenced by openness to sharing, responsiveness to  feedback, and willingness to engage in learned skills. ? ?Mental Status Examination:  ?Appearance: neat ?Behavior: appropriate to circumstances ?Mood: neutral ?Affect: mood congruent ?Speech: WNL ?Eye Contact: intermittent ?Psychomotor Activity: WNL ?Gait: unable to assess ?Thought Process: linear, logical, and goal directed and no evidence or endorsement of suicidal, homicidal, and self-harm ideation, plan and intent  ?Thought Content/Perception: no hallucinations, delusions, bizarre thinking or behavior endorsed or observed ?Orientation: AAOx4 ?Memory/Concentration: memory, attention, language, and fund of knowledge intact  ?Insight: fair ?Judgment: fair ? ?Interventions:  ?Conducted a brief chart review ?Provided empathic reflections and validation ?Employed supportive psychotherapy interventions to facilitate reduced distress and to improve coping skills with identified stressors ?Engaged patient in mindfulness exercise(s) ? ?DSM-5 Diagnosis(es): F50.89 Other Specified Feeding or Eating Disorder, Emotional Eating Behaviors ? ?Treatment Goal & Progress: During the initial appointment with this provider, the following treatment goal was established: increase coping skills. Alisha Chen has demonstrated progress in her goal as evidenced by increased awareness of hunger patterns, increased awareness of triggers for emotional eating behaviors, and reduction in emotional eating behaviors . Alisha Chen also continues to demonstrate willingness to engage in learned skill(s). ? ?Plan: Alisha Chen declined future appointments with this provider. She acknowledged understanding that she may request a follow-up appointment with this provider in the future as long as she is still established with the clinic. No further follow-up planned by this provider.  ? ?

## 2022-02-22 ENCOUNTER — Telehealth (INDEPENDENT_AMBULATORY_CARE_PROVIDER_SITE_OTHER): Payer: BC Managed Care – PPO | Admitting: Psychology

## 2022-02-22 DIAGNOSIS — F5089 Other specified eating disorder: Secondary | ICD-10-CM | POA: Diagnosis not present

## 2022-02-23 ENCOUNTER — Ambulatory Visit (INDEPENDENT_AMBULATORY_CARE_PROVIDER_SITE_OTHER): Payer: BC Managed Care – PPO | Admitting: Physician Assistant

## 2022-02-23 ENCOUNTER — Encounter (INDEPENDENT_AMBULATORY_CARE_PROVIDER_SITE_OTHER): Payer: Self-pay | Admitting: Physician Assistant

## 2022-02-23 VITALS — BP 97/64 | HR 83 | Temp 97.8°F | Ht 66.0 in | Wt 199.0 lb

## 2022-02-23 DIAGNOSIS — Z6832 Body mass index (BMI) 32.0-32.9, adult: Secondary | ICD-10-CM

## 2022-02-23 DIAGNOSIS — E8881 Metabolic syndrome: Secondary | ICD-10-CM

## 2022-02-23 DIAGNOSIS — E669 Obesity, unspecified: Secondary | ICD-10-CM

## 2022-02-24 NOTE — Progress Notes (Signed)
? ? ? ?Chief Complaint:  ? ?OBESITY ?Nile is here to discuss her progress with her obesity treatment plan along with follow-up of her obesity related diagnoses. Aleksia is on keeping a food journal and adhering to recommended goals of 1150-1250 calories and 80 grams of  protein and states she is following her eating plan approximately 50% of the time. Wretha states she is walking, aerobics and using the elliptical for 30-60 minutes 1-3 times per week. ? ?Today's visit was #: 23 ?Starting weight: 201 lbs ?Starting date: 12/10/2018 ?Today's weight: 199 lbs ?Today's date: 02/23/2022 ?Total lbs lost to date: 2 lbs ?Total lbs lost since last in-office visit: 0 ? ?Interim History: Merriam reports that she has done better with journaling and reaching her protein goals. She eats out for lunch and dinner most days and sometimes doesn't eat breakfast. She sometimes exceeds her calories by 300. ? ?Subjective:  ? ?1. Insulin resistance ?Eliani is not on medications currently. She is hungry after eating late afternoon but does not like to eat at night when she gets home around 9 pm. ? ?Assessment/Plan:  ? ?1. Insulin resistance ?Margaretmary will continue with the plan. She will increase protein late afternoon. We will check labs at next office visit. She will continue to work on weight loss, exercise, and decreasing simple carbohydrates to help decrease the risk of diabetes. Jazae agreed to follow-up with [place] as directed to closely monitor her progress. ? ?2. Obesity: BMI 32.13 ?Durga is currently in the action stage of change. As such, her goal is to continue with weight loss efforts. She has agreed to keeping a food journal and adhering to recommended goals of 1150-1250 calories and 80 grams of protein daily.  ? ?Quinette will do IC at next office visit with labs.  ? ?Exercise goals:  As is. ? ?Behavioral modification strategies: increasing lean protein intake, no skipping meals, and meal planning and cooking strategies. ? ?Cecelia has agreed to  follow-up with our clinic in 4 weeks. She was informed of the importance of frequent follow-up visits to maximize her success with intensive lifestyle modifications for her multiple health conditions.  ? ?Objective:  ? ?Blood pressure 97/64, pulse 83, temperature 97.8 ?F (36.6 ?C), height '5\' 6"'$  (1.676 m), weight 199 lb (90.3 kg), last menstrual period 02/27/2012, SpO2 98 %. ?Body mass index is 32.12 kg/m?. ? ?General: Cooperative, alert, well developed, in no acute distress. ?HEENT: Conjunctivae and lids unremarkable. ?Cardiovascular: Regular rhythm.  ?Lungs: Normal work of breathing. ?Neurologic: No focal deficits.  ? ?Lab Results  ?Component Value Date  ? CREATININE 0.82 07/14/2021  ? BUN 17 07/14/2021  ? NA 139 07/14/2021  ? K 5.0 07/14/2021  ? CL 104 07/14/2021  ? CO2 25 07/14/2021  ? ?Lab Results  ?Component Value Date  ? ALT 17 07/14/2021  ? AST 15 07/14/2021  ? ALKPHOS 112 07/14/2021  ? BILITOT 0.3 07/14/2021  ? ?Lab Results  ?Component Value Date  ? HGBA1C 4.8 07/14/2021  ? HGBA1C 5.0 11/10/2020  ? HGBA1C 5.0 05/27/2020  ? HGBA1C 4.8 10/21/2019  ? HGBA1C 4.9 12/10/2018  ? ?Lab Results  ?Component Value Date  ? INSULIN 12.6 07/14/2021  ? INSULIN 11.8 11/10/2020  ? INSULIN 13.5 05/27/2020  ? INSULIN 16.6 12/10/2018  ? ?Lab Results  ?Component Value Date  ? TSH 2.550 07/14/2021  ? ?Lab Results  ?Component Value Date  ? CHOL 204 (H) 07/14/2021  ? HDL 51 07/14/2021  ? LDLCALC 145 (H) 07/14/2021  ? TRIG  45 07/14/2021  ? CHOLHDL 4.0 10/21/2019  ? ?Lab Results  ?Component Value Date  ? VD25OH 57.5 07/14/2021  ? VD25OH 53.6 11/10/2020  ? VD25OH 54.1 05/27/2020  ? ?Lab Results  ?Component Value Date  ? WBC 4.3 05/27/2020  ? HGB 12.2 05/27/2020  ? HCT 38.8 05/27/2020  ? MCV 89 05/27/2020  ? PLT 227 05/27/2020  ? ?No results found for: IRON, TIBC, FERRITIN ? ?Attestation Statements:  ? ?Reviewed by clinician on day of visit: allergies, medications, problem list, medical history, surgical history, family history, social  history, and previous encounter notes. ? ?Time spent on visit including pre-visit chart review and post-visit care and charting was 30 minutes.  ? ?I, Tonye Pearson, am acting as Location manager for [employer], PA-C. ? ?I have reviewed the above documentation for accuracy and completeness, and I agree with the above. Abby Potash, PA-C ? ?

## 2022-03-16 ENCOUNTER — Encounter (INDEPENDENT_AMBULATORY_CARE_PROVIDER_SITE_OTHER): Payer: Self-pay | Admitting: Family Medicine

## 2022-03-16 ENCOUNTER — Telehealth (INDEPENDENT_AMBULATORY_CARE_PROVIDER_SITE_OTHER): Payer: BC Managed Care – PPO | Admitting: Family Medicine

## 2022-03-16 DIAGNOSIS — E669 Obesity, unspecified: Secondary | ICD-10-CM | POA: Diagnosis not present

## 2022-03-16 DIAGNOSIS — E88819 Insulin resistance, unspecified: Secondary | ICD-10-CM

## 2022-03-16 DIAGNOSIS — E8881 Metabolic syndrome: Secondary | ICD-10-CM

## 2022-03-16 DIAGNOSIS — Z6832 Body mass index (BMI) 32.0-32.9, adult: Secondary | ICD-10-CM

## 2022-03-16 NOTE — Progress Notes (Signed)
?TeleHealth Visit:  ?This visit was completed with telemedicine (audio/video) technology. ?Oveda has verbally consented to this TeleHealth visit. The patient is located at home, the provider is located at home. The participants in this visit include the listed provider and patient. The visit was conducted today via MyChart video. ? ?OBESITY ?Alisha Chen is here to discuss her progress with her obesity treatment plan along with follow-up of her obesity related diagnoses.  ? ?Today's visit was # 82 ?Starting weight: 201 lbs ?Starting date: 12/10/2018 ?Weight at last in office visit: 199 lbs on 02/23/22 ?Total weight loss: 2 lbs at last in office visit on 02/23/22. ?Today's reported weight: 201 lbs  ? ?Nutrition Plan:  keeping a food journal and adhering to recommended goals of 1150-1250 calories and 80 grams of protein daily. Marland Kitchen  ?Hunger is well controlled. Cravings are moderately controlled.  ?Current exercise:  Elliptical 3 times 30-45 minutes per week. ? ?Interim History: [name] reports she meets her protein goals every day but sometimes goes over on calories by 100 to 300 cal.  She is very consistent with her journaling.  She reports that she has not been exercising as much recently.  She is packing food for work and eating out very little.  At times she follows the low-carb plan along with her journaling because she knows this will encourage healthier food choices. ?She is not interested in weight loss medications because she does not like the side effects.  She was on Saxenda in the past which caused constipation. ?IC and fasting labs are planned for next office visit. ? ?Assessment/Plan:  ?Insulin Resistance ?[name] has had elevated fasting insulin readings. Goal is HgbA1c < 5.7, fasting insulin closer to 5.   ?She denies polyphagia. ?Medication(s): None ?Lab Results  ?Component Value Date  ? HGBA1C 4.8 07/14/2021  ? ?Lab Results  ?Component Value Date  ? INSULIN 12.6 07/14/2021  ? INSULIN 11.8 11/10/2020  ?  INSULIN 13.5 05/27/2020  ? INSULIN 16.6 12/10/2018  ? ? ?Plan ?Continue to work on exercise and reducing simple carbohydrates in her meals and snacks. ? ?Obesity: Current BMI 32.13 ?Alisha Chen is currently in the action stage of change. As such, her goal is to continue with weight loss efforts.  ?She has agreed to  keeping a food journal and adhering to recommended goals of 1150-1250 calories and 80 grams of protein daily. .  ? ?Exercise goals: Increase frequency and duration of exercise. ? ?Behavioral modification strategies: decreasing simple carbohydrates. ? ?[name] has agreed to follow-up with our clinic in 2 weeks.  ? ?No orders of the defined types were placed in this encounter. ? ? ?There are no discontinued medications.  ? ?No orders of the defined types were placed in this encounter. ?   ? ?Objective:  ? ?VITALS: Per patient if applicable, see vitals. ?GENERAL: Alert and in no acute distress. ?CARDIOPULMONARY: No increased WOB. Speaking in clear sentences.  ?PSYCH: Pleasant and cooperative. Speech normal rate and rhythm. Affect is appropriate. Insight and judgement are appropriate. Attention is focused, linear, and appropriate.  ?NEURO: Oriented as arrived to appointment on time with no prompting.  ? ?Lab Results  ?Component Value Date  ? CREATININE 0.82 07/14/2021  ? BUN 17 07/14/2021  ? NA 139 07/14/2021  ? K 5.0 07/14/2021  ? CL 104 07/14/2021  ? CO2 25 07/14/2021  ? ?Lab Results  ?Component Value Date  ? ALT 17 07/14/2021  ? AST 15 07/14/2021  ? ALKPHOS 112 07/14/2021  ? BILITOT 0.3 07/14/2021  ? ?  Lab Results  ?Component Value Date  ? HGBA1C 4.8 07/14/2021  ? HGBA1C 5.0 11/10/2020  ? HGBA1C 5.0 05/27/2020  ? HGBA1C 4.8 10/21/2019  ? HGBA1C 4.9 12/10/2018  ? ?Lab Results  ?Component Value Date  ? INSULIN 12.6 07/14/2021  ? INSULIN 11.8 11/10/2020  ? INSULIN 13.5 05/27/2020  ? INSULIN 16.6 12/10/2018  ? ?Lab Results  ?Component Value Date  ? TSH 2.550 07/14/2021  ? ?Lab Results  ?Component Value Date  ? CHOL  204 (H) 07/14/2021  ? HDL 51 07/14/2021  ? LDLCALC 145 (H) 07/14/2021  ? TRIG 45 07/14/2021  ? CHOLHDL 4.0 10/21/2019  ? ?Lab Results  ?Component Value Date  ? WBC 4.3 05/27/2020  ? HGB 12.2 05/27/2020  ? HCT 38.8 05/27/2020  ? MCV 89 05/27/2020  ? PLT 227 05/27/2020  ? ?No results found for: IRON, TIBC, FERRITIN ?Lab Results  ?Component Value Date  ? VD25OH 57.5 07/14/2021  ? VD25OH 53.6 11/10/2020  ? VD25OH 54.1 05/27/2020  ? ? ?Attestation Statements:  ? ?Reviewed by clinician on day of visit: allergies, medications, problem list, medical history, surgical history, family history, social history, and previous encounter notes. ? ? ? ? ?

## 2022-03-28 ENCOUNTER — Ambulatory Visit (INDEPENDENT_AMBULATORY_CARE_PROVIDER_SITE_OTHER): Payer: BC Managed Care – PPO | Admitting: Bariatrics

## 2022-03-28 ENCOUNTER — Encounter (INDEPENDENT_AMBULATORY_CARE_PROVIDER_SITE_OTHER): Payer: Self-pay | Admitting: Bariatrics

## 2022-03-28 VITALS — BP 113/63 | HR 64 | Temp 97.9°F | Ht 66.0 in | Wt 201.0 lb

## 2022-03-28 DIAGNOSIS — E669 Obesity, unspecified: Secondary | ICD-10-CM | POA: Diagnosis not present

## 2022-03-28 DIAGNOSIS — Z6832 Body mass index (BMI) 32.0-32.9, adult: Secondary | ICD-10-CM | POA: Diagnosis not present

## 2022-03-28 DIAGNOSIS — R0602 Shortness of breath: Secondary | ICD-10-CM

## 2022-03-28 DIAGNOSIS — R5383 Other fatigue: Secondary | ICD-10-CM

## 2022-03-28 DIAGNOSIS — E038 Other specified hypothyroidism: Secondary | ICD-10-CM | POA: Diagnosis not present

## 2022-04-03 NOTE — Progress Notes (Signed)
Chief Complaint:   OBESITY Alisha Chen is here to discuss her progress with her obesity treatment plan along with follow-up of her obesity related diagnoses. Alisha Chen is on keeping a food journal and adhering to recommended goals of [phone] calories and 80 grams of protein and states she is following her eating plan approximately 50% of the time. Alisha Chen states she is doing video exercise for 30 minutes 1-3 times per week.  Today's visit was #: 37 Starting weight: 201 lbs Starting date: 12/10/2018 Today's weight: 201 lbs Today's date: 03/28/2022 Total lbs lost to date: 0 Total lbs lost since last in-office visit: 0  Interim History: Alisha Chen is up 2 lbs since her last visit.   Subjective:   1. Other fatigue/SOB (shortness of breath) on exertion Alisha Chen's REE has significantly declined. She will go back to Category 1 and or 1,000 calories and 70-80 grams of protein for her plan. Her RMR today was 1080. Her previous REE 07/2020 was 1547. Her RMR is lower than expected.   2. Other specified hypothyroidism Alisha Chen is currently taking Synthroid.    Assessment/Plan:   1. Other fatigue/SOB (shortness of breath) on exertion We discussed  Alisha Chen will change her diet plan and she will increase exercise and activities.   2. Other specified hypothyroidism Alisha Chen will continue her medications. Orders and follow up as documented in patient record.  Counseling Good thyroid control is important for overall health. Supratherapeutic thyroid levels are dangerous and will not improve weight loss results. Counseling: The correct way to take levothyroxine is fasting, with water, separated by at least 30 minutes from breakfast, and separated by more than 4 hours from calcium, iron, multivitamins, acid reflux medications (PPIs).    3. Obesity, Current BMI 32.6 Alisha Chen is currently in the action stage of change. As such, her goal is to continue with weight loss efforts. She has agreed to the Category 1 Plan and keeping  a food journal and adhering to recommended goals of 1000 calories and 70-80  grams of protein.   Alisha Chen will continue meal planning and she will continue intentional eating. She will increase raw vegetables.   Exercise goals:  Alisha Chen will increase cardio and resistance.   Behavioral modification strategies: increasing lean protein intake, decreasing simple carbohydrates, increasing vegetables, increasing water intake, decreasing eating out, no skipping meals, meal planning and cooking strategies, keeping healthy foods in the home, and planning for success.  Alisha Chen has agreed to follow-up with our clinic in 4-5 weeks. She was informed of the importance of frequent follow-up visits to maximize her success with intensive lifestyle modifications for her multiple health conditions.   Objective:   Blood pressure 113/63, pulse 64, temperature 97.9 F (36.6 C), height '5\' 6"'$  (1.676 m), weight 201 lb (91.2 kg), last menstrual period 02/27/2012, SpO2 100 %. Body mass index is 32.44 kg/m.  General: Cooperative, alert, well developed, in no acute distress. HEENT: Conjunctivae and lids unremarkable. Cardiovascular: Regular rhythm.  Lungs: Normal work of breathing. Neurologic: No focal deficits.   Lab Results  Component Value Date   CREATININE 0.82 07/14/2021   BUN 17 07/14/2021   NA 139 07/14/2021   K 5.0 07/14/2021   CL 104 07/14/2021   CO2 25 07/14/2021   Lab Results  Component Value Date   ALT 17 07/14/2021   AST 15 07/14/2021   ALKPHOS 112 07/14/2021   BILITOT 0.3 07/14/2021   Lab Results  Component Value Date   HGBA1C 4.8 07/14/2021   HGBA1C 5.0 11/10/2020  HGBA1C 5.0 05/27/2020   HGBA1C 4.8 10/21/2019   HGBA1C 4.9 12/10/2018   Lab Results  Component Value Date   INSULIN 12.6 07/14/2021   INSULIN 11.8 11/10/2020   INSULIN 13.5 05/27/2020   INSULIN 16.6 12/10/2018   Lab Results  Component Value Date   TSH 2.550 07/14/2021   Lab Results  Component Value Date   CHOL 204  (H) 07/14/2021   HDL 51 07/14/2021   LDLCALC 145 (H) 07/14/2021   TRIG 45 07/14/2021   CHOLHDL 4.0 10/21/2019   Lab Results  Component Value Date   VD25OH 57.5 07/14/2021   VD25OH 53.6 11/10/2020   VD25OH 54.1 05/27/2020   Lab Results  Component Value Date   WBC 4.3 05/27/2020   HGB 12.2 05/27/2020   HCT 38.8 05/27/2020   MCV 89 05/27/2020   PLT 227 05/27/2020   No results found for: IRON, TIBC, FERRITIN  Attestation Statements:   Reviewed by clinician on day of visit: allergies, medications, problem list, medical history, surgical history, family history, social history, and previous encounter notes.  Time spent on visit including pre-visit chart review and post-visit care and charting was 30 minutes.   I, Lizbeth Bark, RMA, am acting as Location manager for [employer], DO.  I have reviewed the above documentation for accuracy and completeness, and I agree with the above. Jearld Lesch, DO

## 2022-04-06 ENCOUNTER — Other Ambulatory Visit: Payer: Self-pay | Admitting: Family Medicine

## 2022-04-14 ENCOUNTER — Encounter (INDEPENDENT_AMBULATORY_CARE_PROVIDER_SITE_OTHER): Payer: Self-pay | Admitting: Bariatrics

## 2022-04-14 NOTE — Progress Notes (Signed)
TeleHealth Visit:  This visit was completed with telemedicine (audio/video) technology. Alisha Chen has verbally consented to this TeleHealth visit. The patient is located at home, the provider is located at home. The participants in this visit include the listed provider and patient. The visit was conducted today via MyChart video.  OBESITY Alisha Chen is here to discuss her progress with her obesity treatment plan along with follow-up of her obesity related diagnoses.   Today's visit was # 65 Starting weight: 201 lbs Starting date: 12/10/2018 Weight at last in office visit: 201 lbs on 03/28/22 Total weight loss: 0 lbs at last in office visit on 03/28/22. Today's reported weight:  No weight reported.  Nutrition Plan: the Category 1 Plan and keeping a food journal and adhering to recommended goals of 1000 calories and 70-80 gms protein.  Hunger is well controlled. Cravings are well controlled.  Current exercise:  cardio for 30 minutes a few times per week.  Interim History: Incentive calorimetry repeated at last visit and RMR was less than expected.  It was 1080, down from 1419 (08/04/20). Her weight has fluctuated around 198-200 pounds since she started at our clinic in January 2020.  Low weight was 192 pounds in August 2021. Alisha Chen journals consistently but tends to eat 1 larger meal per day and 1 protein shake.  She generally meets her goal with calories and protein.  She is not currently doing any strength training.  Assessment/Plan:  1. Insulin Resistance A1c is normal but fasting insulin has been moderately elevated, 12.6 at last check. She denies polyphagia. Medication(s): none.  Saxenda caused severe constipation.  Metformin caused dysgeusia. Lab Results  Component Value Date   HGBA1C 4.8 07/14/2021   Lab Results  Component Value Date   INSULIN 12.6 07/14/2021   INSULIN 11.8 11/10/2020   INSULIN 13.5 05/27/2020   INSULIN 16.6 12/10/2018    Plan Continue to work on weight loss  and exercise.  2. Hypothyroidism Stable.  Does not report symptoms associated with uncontrolled hypothyroidism. Medication(s): Levothyroxine 50 mcg daily. Lab Results  Component Value Date   TSH 2.550 07/14/2021    Plan: Continue levothyroxine at current dose.  3. Obesity: Current BMI 32.46 Alisha Chen is currently in the action stage of change. As such, her goal is to continue with weight loss efforts.  She has agreed to keeping a food journal and adhering to recommended goals of 1000 calories and 70-80 gms protein. Marland Kitchen   Exercise goals: Encouraged her to increase cardio to 150/min/week.  Encouraged strength training 3 times per week using body weight or hand weights. Discussed importance of building muscle to increase metabolism.  Behavioral modification strategies: no skipping meals. We discussed that eating 1 large meal a day +1 shake is not working well for her as far as weight loss or increasing her metabolism. I encouraged her to have 2 meals of 300 to 500 cal and 1 protein shake daily (150 calories).  She does not feel it is realistic for her to have 3 meals per day. Hopefully increasing the frequency of eating as well as adding strength training will help to improve her resting metabolic rate.  Alisha Chen has agreed to follow-up with our clinic in 3 weeks.   No orders of the defined types were placed in this encounter.   There are no discontinued medications.   No orders of the defined types were placed in this encounter.     Objective:   VITALS: Per patient if applicable, see vitals. GENERAL: Alert and in  no acute distress. CARDIOPULMONARY: No increased WOB. Speaking in clear sentences.  PSYCH: Pleasant and cooperative. Speech normal rate and rhythm. Affect is appropriate. Insight and judgement are appropriate. Attention is focused, linear, and appropriate.  NEURO: Oriented as arrived to appointment on time with no prompting.   Lab Results  Component Value Date   CREATININE  0.82 07/14/2021   BUN 17 07/14/2021   NA 139 07/14/2021   K 5.0 07/14/2021   CL 104 07/14/2021   CO2 25 07/14/2021   Lab Results  Component Value Date   ALT 17 07/14/2021   AST 15 07/14/2021   ALKPHOS 112 07/14/2021   BILITOT 0.3 07/14/2021   Lab Results  Component Value Date   HGBA1C 4.8 07/14/2021   HGBA1C 5.0 11/10/2020   HGBA1C 5.0 05/27/2020   HGBA1C 4.8 10/21/2019   HGBA1C 4.9 12/10/2018   Lab Results  Component Value Date   INSULIN 12.6 07/14/2021   INSULIN 11.8 11/10/2020   INSULIN 13.5 05/27/2020   INSULIN 16.6 12/10/2018   Lab Results  Component Value Date   TSH 2.550 07/14/2021   Lab Results  Component Value Date   CHOL 204 (H) 07/14/2021   HDL 51 07/14/2021   LDLCALC 145 (H) 07/14/2021   TRIG 45 07/14/2021   CHOLHDL 4.0 10/21/2019   Lab Results  Component Value Date   WBC 4.3 05/27/2020   HGB 12.2 05/27/2020   HCT 38.8 05/27/2020   MCV 89 05/27/2020   PLT 227 05/27/2020   No results found for: IRON, TIBC, FERRITIN Lab Results  Component Value Date   VD25OH 57.5 07/14/2021   VD25OH 53.6 11/10/2020   VD25OH 54.1 05/27/2020    Attestation Statements:   Reviewed by clinician on day of visit: allergies, medications, problem list, medical history, surgical history, family history, social history, and previous encounter notes.  Time spent on visit including pre-visit chart review and post-visit charting and care was 33 minutes.

## 2022-04-19 ENCOUNTER — Telehealth (INDEPENDENT_AMBULATORY_CARE_PROVIDER_SITE_OTHER): Payer: BC Managed Care – PPO | Admitting: Family Medicine

## 2022-04-19 ENCOUNTER — Encounter (INDEPENDENT_AMBULATORY_CARE_PROVIDER_SITE_OTHER): Payer: Self-pay | Admitting: Family Medicine

## 2022-04-19 DIAGNOSIS — Z6832 Body mass index (BMI) 32.0-32.9, adult: Secondary | ICD-10-CM | POA: Diagnosis not present

## 2022-04-19 DIAGNOSIS — E039 Hypothyroidism, unspecified: Secondary | ICD-10-CM | POA: Diagnosis not present

## 2022-04-19 DIAGNOSIS — E8881 Metabolic syndrome: Secondary | ICD-10-CM

## 2022-04-19 DIAGNOSIS — E88819 Insulin resistance, unspecified: Secondary | ICD-10-CM

## 2022-04-19 DIAGNOSIS — E669 Obesity, unspecified: Secondary | ICD-10-CM | POA: Diagnosis not present

## 2022-04-19 NOTE — Addendum Note (Signed)
Addended by: Georgianne Fick on: 04/19/2022 01:33 PM   Modules accepted: Level of Service

## 2022-05-12 ENCOUNTER — Ambulatory Visit (INDEPENDENT_AMBULATORY_CARE_PROVIDER_SITE_OTHER): Payer: BC Managed Care – PPO | Admitting: Bariatrics

## 2022-05-12 ENCOUNTER — Encounter (INDEPENDENT_AMBULATORY_CARE_PROVIDER_SITE_OTHER): Payer: Self-pay | Admitting: Bariatrics

## 2022-05-12 VITALS — BP 97/64 | HR 82 | Temp 98.0°F | Ht 66.0 in | Wt 198.0 lb

## 2022-05-12 DIAGNOSIS — E669 Obesity, unspecified: Secondary | ICD-10-CM

## 2022-05-12 DIAGNOSIS — Z6832 Body mass index (BMI) 32.0-32.9, adult: Secondary | ICD-10-CM

## 2022-05-12 DIAGNOSIS — E8881 Metabolic syndrome: Secondary | ICD-10-CM

## 2022-05-12 DIAGNOSIS — E7849 Other hyperlipidemia: Secondary | ICD-10-CM | POA: Diagnosis not present

## 2022-05-17 ENCOUNTER — Encounter (INDEPENDENT_AMBULATORY_CARE_PROVIDER_SITE_OTHER): Payer: Self-pay | Admitting: Bariatrics

## 2022-06-08 NOTE — Progress Notes (Signed)
TeleHealth Visit:  This visit was completed with telemedicine (audio/video) technology. Alisha Chen has verbally consented to this TeleHealth visit. The patient is located at home, the provider is located at home. The participants in this visit include the listed provider and patient. The visit was conducted today via MyChart video.  OBESITY Alisha Chen is here to discuss her progress with her obesity treatment plan along with follow-up of her obesity related diagnoses.   Today's visit was # 85 Starting weight: 201 lbs Starting date: 12/10/2018 Weight at last in office visit: 198 lbs on 05/12/22 Total weight loss: 3 lbs at last in office visit on 05/12/22. Today's reported weight: 198 lbs   Nutrition Plan: Category 1 Plan and keeping a food journal and/or adhering to recommended goals of 1000 calories and 70-80 grams of protein.  Hunger is well controlled. Cravings are poorly controlled.  Current exercise: Alisha Chen states she is doing aerobics for 30+ minutes 3+ times per week.  Interim History: Alisha Chen's weight remains the same.  She recollects a time in her past when she was living in [place] that she was successful with weight loss.  She remembers that her exercise, eating, and sleep had to be on point.  Currently she feels that she does not get enough sleep because her son and husband stay up later than she does and the noise keeps her up despite using earplugs. She journals and follows category 1 but over eats in the evening frequently.  Calories range from thousand to 1300.  She is consistently meeting protein goals.  Assessment/Plan:  1. Insulin Resistance Alisha Chen has had elevated fasting insulin readings- most recent 12.6 on 07/14/21. She reports polyphagia/cravings at night. Medication(s): none Lab Results  Component Value Date   HGBA1C 4.8 07/14/2021   Lab Results  Component Value Date   INSULIN 12.6 07/14/2021   INSULIN 11.8 11/10/2020   INSULIN 13.5 05/27/2020   INSULIN 16.6  12/10/2018    Plan Continue meal plan.   2.  Eating disorder/emotional eating Eating disorder/emotional eating Alisha Chen has had issues with stress/emotional eating.  This is occurs mostly at night.  She feels she could do fine if she could go to bed after she gets off work at night but she cannot because her family keeps her awake. Currently this is poorly controlled. Overall mood is stable. Denies suicidal/homicidal ideation. Alisha Chen also reports some self sabotage.  When she loses some weight she begins to worry that her breast cancer has returned and when she regained weight this is reassuring to her. She has had some visits with Dr. Mallie Mussel in the past but reports this made her "sad". Medication(s): none  Plan: I recommended that Alisha Chen connect with a counselor at the cancer center to discuss this.  She said she would consider it. Alisha Chen declined medication for this.  She wants to do this "on her own".   3. Obesity: Current BMI 31.97 Alisha Chen is currently in the action stage of change. As such, her goal is to continue with weight loss efforts.  She has agreed to Category 1 Plan and keeping a food journal and/or adhering to recommended goals of 1000 calories and 70-80 grams of protein. Marland Kitchen   Exercise goals: as is.  Behavioral modification strategies: increasing lean protein intake, decreasing simple carbohydrates, and emotional eating strategies.  Alisha Chen has agreed to follow-up with our clinic in 4 weeks.   No orders of the defined types were placed in this encounter.   There are no discontinued medications.  No orders of the defined types were placed in this encounter.     Objective:   VITALS: Per patient if applicable, see vitals. GENERAL: Alert and in no acute distress. CARDIOPULMONARY: No increased WOB. Speaking in clear sentences.  PSYCH: Pleasant and cooperative. Speech normal rate and rhythm. Affect is appropriate. Insight and judgement are appropriate. Attention is focused,  linear, and appropriate.  NEURO: Oriented as arrived to appointment on time with no prompting.   Lab Results  Component Value Date   CREATININE 0.82 07/14/2021   BUN 17 07/14/2021   NA 139 07/14/2021   K 5.0 07/14/2021   CL 104 07/14/2021   CO2 25 07/14/2021   Lab Results  Component Value Date   ALT 17 07/14/2021   AST 15 07/14/2021   ALKPHOS 112 07/14/2021   BILITOT 0.3 07/14/2021   Lab Results  Component Value Date   HGBA1C 4.8 07/14/2021   HGBA1C 5.0 11/10/2020   HGBA1C 5.0 05/27/2020   HGBA1C 4.8 10/21/2019   HGBA1C 4.9 12/10/2018   Lab Results  Component Value Date   INSULIN 12.6 07/14/2021   INSULIN 11.8 11/10/2020   INSULIN 13.5 05/27/2020   INSULIN 16.6 12/10/2018   Lab Results  Component Value Date   TSH 2.550 07/14/2021   Lab Results  Component Value Date   CHOL 204 (H) 07/14/2021   HDL 51 07/14/2021   LDLCALC 145 (H) 07/14/2021   TRIG 45 07/14/2021   CHOLHDL 4.0 10/21/2019   Lab Results  Component Value Date   WBC 4.3 05/27/2020   HGB 12.2 05/27/2020   HCT 38.8 05/27/2020   MCV 89 05/27/2020   PLT 227 05/27/2020   No results found for: "IRON", "TIBC", "FERRITIN" Lab Results  Component Value Date   VD25OH 57.5 07/14/2021   VD25OH 53.6 11/10/2020   VD25OH 54.1 05/27/2020    Attestation Statements:   Reviewed by clinician on day of visit: allergies, medications, problem list, medical history, surgical history, family history, social history, and previous encounter notes.  Time spent on visit including the items listed below was 35 minutes.  -preparing to see the patient (e.g., review of tests, history, previous notes) -obtaining and/or reviewing separately obtained history -counseling and educating the patient/family/caregiver -documenting clinical information in the electronic or other health record

## 2022-06-09 ENCOUNTER — Telehealth (INDEPENDENT_AMBULATORY_CARE_PROVIDER_SITE_OTHER): Payer: BC Managed Care – PPO | Admitting: Family Medicine

## 2022-06-09 ENCOUNTER — Encounter (INDEPENDENT_AMBULATORY_CARE_PROVIDER_SITE_OTHER): Payer: Self-pay

## 2022-06-09 ENCOUNTER — Encounter (INDEPENDENT_AMBULATORY_CARE_PROVIDER_SITE_OTHER): Payer: Self-pay | Admitting: Family Medicine

## 2022-06-09 DIAGNOSIS — E88819 Insulin resistance, unspecified: Secondary | ICD-10-CM

## 2022-06-09 DIAGNOSIS — Z6831 Body mass index (BMI) 31.0-31.9, adult: Secondary | ICD-10-CM

## 2022-06-09 DIAGNOSIS — E669 Obesity, unspecified: Secondary | ICD-10-CM

## 2022-06-09 DIAGNOSIS — E8881 Metabolic syndrome: Secondary | ICD-10-CM | POA: Diagnosis not present

## 2022-06-09 DIAGNOSIS — F509 Eating disorder, unspecified: Secondary | ICD-10-CM | POA: Diagnosis not present

## 2022-06-29 ENCOUNTER — Encounter (INDEPENDENT_AMBULATORY_CARE_PROVIDER_SITE_OTHER): Payer: Self-pay

## 2022-07-07 ENCOUNTER — Ambulatory Visit (INDEPENDENT_AMBULATORY_CARE_PROVIDER_SITE_OTHER): Payer: BC Managed Care – PPO | Admitting: Bariatrics

## 2022-07-14 ENCOUNTER — Telehealth (INDEPENDENT_AMBULATORY_CARE_PROVIDER_SITE_OTHER): Payer: BC Managed Care – PPO | Admitting: Family Medicine

## 2022-07-18 NOTE — Progress Notes (Signed)
TeleHealth Visit:  This visit was completed with telemedicine (audio/video) technology. Alisha Chen has verbally consented to this TeleHealth visit. The patient is located at home, the provider is located at home. The participants in this visit include the listed provider and patient. The visit was conducted today via The visit was conducted today via phone call per patient's request.  Length of call was 17 minutes.   OBESITY Alisha Chen is here to discuss her progress with her obesity treatment plan along with follow-up of her obesity related diagnoses.   Today's visit was # 15 Starting weight: 201 lbs Starting date: 12/10/2018 Weight at last in office visit: 198 lbs on 05/12/22 Total weight loss: 3 lbs at last in office visit on 05/12/22. Today's reported weight: 198 lbs   Nutrition Plan: the Category 1 Plan and keeping a food journal and adhering to recommended goals of 1000 calories and 70-80 gms protein.   Current exercise:  elliptical 60 minutes 7 days per week.  Interim History: Alisha Chen reports she has done very well over the past several weeks with exercise and her food choices.  She is journaling daily and meeting protein and calorie goals most of the time.  She has increased her exercise from 30 minutes 3 days a week to 60 minutes 7 days/week.  She has told me that she has lost weight best when she is exercising, making good food choices, and getting adequate sleep. She feels that she is getting more sleep than she was.  Her weight has been essentially plateaued for the past 2 years.  The lowest she has been since joining our program is 192 pounds in August 2021.  She has been on Saxenda in the past but stopped due to constipation/hemorrhoids.  She has also been on metformin but stopped due to dysgeusia.  Assessment/Plan:  We discussed her recent lab results in depth. 1. Hyperlipidemia LDL is not at goal.  LDL elevated at 145, HDL and triglycerides are normal.  10-year ASCVD risk  score is 1.8%. Medication(s): none Cardiovascular risk factors: dyslipidemia and obesity (BMI >= 30 kg/m2)  Lab Results  Component Value Date   CHOL 204 (H) 07/14/2021   HDL 51 07/14/2021   LDLCALC 145 (H) 07/14/2021   TRIG 45 07/14/2021   CHOLHDL 4.0 10/21/2019   Lab Results  Component Value Date   ALT 17 07/14/2021   AST 15 07/14/2021   ALKPHOS 112 07/14/2021   BILITOT 0.3 07/14/2021   The 10-year ASCVD risk score (Arnett DK, et al., 2019) is: 1.8%   Values used to calculate the score:     Age: 56 years     Sex: Female     Is Non-Hispanic African American: Yes     Diabetic: No     Tobacco smoker: No     Systolic Blood Pressure: 97 mmHg     Is BP treated: No     HDL Cholesterol: 51 mg/dL     Total Cholesterol: 204 mg/dL  Plan: No statin indicated, ASCVD risk score is 1.8%. Continue regular exercise and efforts to lose weight.  2. Eating disorder/emotional eating Alisha Chen reports self sabotage.  When she loses some weight she begins to worry that her breast cancer has returned and when she regains weight this is reassuring to her.  She met with Dr. Mallie Mussel in the past but declines to restart.  Medication(s): none  Plan: Encouraged her to seek counseling at the cancer center. Reassurance provided.  Discussed that weight loss with her interventions is  normal and she knows this rationally.  3. Vitamin D Deficiency Vitamin D is at goal of 50.  Last vitamin D was 57.5 on 07/14/2021. She is on daily OTC vitamin D3 1000 IU. Lab Results  Component Value Date   VD25OH 57.5 07/14/2021   VD25OH 53.6 11/10/2020   VD25OH 54.1 05/27/2020    Plan: Continue daily OTC vitamin D3 1000 IU.  4. Obesity: Current BMI 31.81 Alisha Chen is currently in the action stage of change. As such, her goal is to continue with weight loss efforts.  She has agreed to keeping a food journal and adhering to recommended goals of 1000 calories and 70-80 gms protein.   Exercise goals: as is.  Behavioral  modification strategies: increasing lean protein intake, decreasing simple carbohydrates, and keeping a strict food journal.  Alisha Chen has agreed to follow-up with our clinic in 4 weeks.   No orders of the defined types were placed in this encounter.   There are no discontinued medications.   No orders of the defined types were placed in this encounter.     Objective:   VITALS: Per patient if applicable, see vitals. GENERAL: Alert and in no acute distress. CARDIOPULMONARY: No increased WOB. Speaking in clear sentences.  PSYCH: Pleasant and cooperative. Speech normal rate and rhythm. Affect is appropriate. Insight and judgement are appropriate. Attention is focused, linear, and appropriate.  NEURO: Oriented as arrived to appointment on time with no prompting.   Lab Results  Component Value Date   CREATININE 0.82 07/14/2021   BUN 17 07/14/2021   NA 139 07/14/2021   K 5.0 07/14/2021   CL 104 07/14/2021   CO2 25 07/14/2021   Lab Results  Component Value Date   ALT 17 07/14/2021   AST 15 07/14/2021   ALKPHOS 112 07/14/2021   BILITOT 0.3 07/14/2021   Lab Results  Component Value Date   HGBA1C 4.8 07/14/2021   HGBA1C 5.0 11/10/2020   HGBA1C 5.0 05/27/2020   HGBA1C 4.8 10/21/2019   HGBA1C 4.9 12/10/2018   Lab Results  Component Value Date   INSULIN 12.6 07/14/2021   INSULIN 11.8 11/10/2020   INSULIN 13.5 05/27/2020   INSULIN 16.6 12/10/2018   Lab Results  Component Value Date   TSH 2.550 07/14/2021   Lab Results  Component Value Date   CHOL 204 (H) 07/14/2021   HDL 51 07/14/2021   LDLCALC 145 (H) 07/14/2021   TRIG 45 07/14/2021   CHOLHDL 4.0 10/21/2019   Lab Results  Component Value Date   WBC 4.3 05/27/2020   HGB 12.2 05/27/2020   HCT 38.8 05/27/2020   MCV 89 05/27/2020   PLT 227 05/27/2020   No results found for: "IRON", "TIBC", "FERRITIN" Lab Results  Component Value Date   VD25OH 57.5 07/14/2021   VD25OH 53.6 11/10/2020   VD25OH 54.1 05/27/2020     Attestation Statements:   Reviewed by clinician on day of visit: allergies, medications, problem list, medical history, surgical history, family history, social history, and previous encounter notes.  Time spent on visit including the items listed below was 35 minutes.  -preparing to see the patient (e.g., review of tests, history, previous notes) -obtaining and/or reviewing separately obtained history -counseling and educating the patient/family/caregiver -documenting clinical information in the electronic or other health record -ordering medications, tests, or procedures -independently interpreting results and communicating results to the patient/ family/caregiver

## 2022-07-19 ENCOUNTER — Encounter (INDEPENDENT_AMBULATORY_CARE_PROVIDER_SITE_OTHER): Payer: Self-pay

## 2022-07-19 ENCOUNTER — Telehealth (INDEPENDENT_AMBULATORY_CARE_PROVIDER_SITE_OTHER): Payer: BC Managed Care – PPO | Admitting: Family Medicine

## 2022-07-19 ENCOUNTER — Encounter (INDEPENDENT_AMBULATORY_CARE_PROVIDER_SITE_OTHER): Payer: Self-pay | Admitting: Family Medicine

## 2022-07-19 DIAGNOSIS — E559 Vitamin D deficiency, unspecified: Secondary | ICD-10-CM

## 2022-07-19 DIAGNOSIS — F5089 Other specified eating disorder: Secondary | ICD-10-CM

## 2022-07-19 DIAGNOSIS — Z6831 Body mass index (BMI) 31.0-31.9, adult: Secondary | ICD-10-CM

## 2022-07-19 DIAGNOSIS — E669 Obesity, unspecified: Secondary | ICD-10-CM | POA: Diagnosis not present

## 2022-07-19 DIAGNOSIS — E785 Hyperlipidemia, unspecified: Secondary | ICD-10-CM | POA: Diagnosis not present

## 2022-08-03 NOTE — Progress Notes (Signed)
TeleHealth Visit:  This visit was completed with telemedicine (audio/video) technology. Alisha Chen has verbally consented to this TeleHealth visit. The patient is located at home, the provider is located at home. The participants in this visit include the listed provider and patient. The visit was conducted today via phone call.   Length of call was 21 minutes.   OBESITY Alisha Chen is here to discuss her progress with her obesity treatment plan along with follow-up of her obesity related diagnoses.   Today's visit was # 71 Starting weight: 201 lbs Starting date: 12/10/2018 Weight at last in office visit: 198 lbs on 05/12/22 Total weight loss: 3 lbs at last in office visit on 05/12/22. Today's reported weight: 198 lbs   Nutrition Plan: keeping a food journal and adhering to recommended goals of 1000 calories and 70-80 gms protein.   Current exercise:  elliptical/walking minutes 30-40 minutes 3 days per week.  Interim History:   Alisha Chen's weight has been essentially plateaued for the past 2 years.  The lowest she has been since joining our program was 192 pounds in August 2021. She is very consistent with journaling but tends to go over on calories and fall short of her protein goal.  Her protein has averaged 50 to 70 g/day.   Recently she has been skipping breakfast some days and some days picking up a bacon and egg and cheese sandwich.  For lunch she is getting a [place] [employer] [place] and provolone sandwich (284 cal / 27 g of protein) with chips (210 cal)..  Dinner is usually a snack such as a bag of chips.  Exercise frequently has decreased with the start of school.  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  She has been on Saxenda in the past but stopped due to constipation/hemorrhoids. She has also been on metformin but stopped due to dysgeusia. She generally declines any prescription medications that are offered.  Assessment/Plan:  1. Insulin  Resistance Fasting insulin was 12.6 on 07/14/2021. Medication(s): none Lab Results  Component Value Date   HGBA1C 4.8 07/14/2021   Lab Results  Component Value Date   INSULIN 12.6 07/14/2021   INSULIN 11.8 11/10/2020   INSULIN 13.5 05/27/2020   INSULIN 16.6 12/10/2018   Plan Continue to work on increasing protein and decreasing simple carbohydrates.   2. Obesity: Current BMI 31.97 Alisha Chen is currently in the action stage of change. As such, her goal is to continue with weight loss efforts.  She has agreed to keeping a food journal and adhering to recommended goals of 1000 calories and 70-80 gms protein. Marland Kitchen   Exercise goals: as is.  1. Discussed ideas for improving dinner: Get medium size [place] [employer] sub at lunch and eat half for dinner. Have a frozen meal for dinner.  2.  Suggested buying her own lower calorie chips in small  bags to eat with her sub.   Behavioral modification strategies: decreasing eating out, no skipping meals, and better snacking choices.  Alisha Chen has agreed to follow-up with our clinic in 2 weeks.   No orders of the defined types were placed in this encounter.   There are no discontinued medications.   No orders of the defined types were placed in this encounter.     Objective:   VITALS: Per patient if applicable, see vitals. GENERAL: Alert and in no acute distress. CARDIOPULMONARY: No increased WOB. Speaking in clear sentences.  PSYCH: Pleasant and cooperative. Speech normal  rate and rhythm. Affect is appropriate. Insight and judgement are appropriate. Attention is focused, linear, and appropriate.  NEURO: Oriented as arrived to appointment on time with no prompting.   Lab Results  Component Value Date   CREATININE 0.82 07/14/2021   BUN 17 07/14/2021   NA 139 07/14/2021   K 5.0 07/14/2021   CL 104 07/14/2021   CO2 25 07/14/2021   Lab Results  Component Value Date   ALT 17 07/14/2021   AST 15 07/14/2021   ALKPHOS 112 07/14/2021    BILITOT 0.3 07/14/2021   Lab Results  Component Value Date   HGBA1C 4.8 07/14/2021   HGBA1C 5.0 11/10/2020   HGBA1C 5.0 05/27/2020   HGBA1C 4.8 10/21/2019   HGBA1C 4.9 12/10/2018   Lab Results  Component Value Date   INSULIN 12.6 07/14/2021   INSULIN 11.8 11/10/2020   INSULIN 13.5 05/27/2020   INSULIN 16.6 12/10/2018   Lab Results  Component Value Date   TSH 2.550 07/14/2021   Lab Results  Component Value Date   CHOL 204 (H) 07/14/2021   HDL 51 07/14/2021   LDLCALC 145 (H) 07/14/2021   TRIG 45 07/14/2021   CHOLHDL 4.0 10/21/2019   Lab Results  Component Value Date   WBC 4.3 05/27/2020   HGB 12.2 05/27/2020   HCT 38.8 05/27/2020   MCV 89 05/27/2020   PLT 227 05/27/2020   No results found for: "IRON", "TIBC", "FERRITIN" Lab Results  Component Value Date   VD25OH 57.5 07/14/2021   VD25OH 53.6 11/10/2020   VD25OH 54.1 05/27/2020    Attestation Statements:   Reviewed by clinician on day of visit: allergies, medications, problem list, medical history, surgical history, family history, social history, and previous encounter notes.  Time spent on visit including the items listed below was 32 minutes.  -preparing to see the patient (e.g., review of tests, history, previous notes) -obtaining and/or reviewing separately obtained history -counseling and educating the patient/family/caregiver -documenting clinical information in the electronic or other health record

## 2022-08-04 ENCOUNTER — Encounter (INDEPENDENT_AMBULATORY_CARE_PROVIDER_SITE_OTHER): Payer: Self-pay | Admitting: Family Medicine

## 2022-08-04 ENCOUNTER — Telehealth (INDEPENDENT_AMBULATORY_CARE_PROVIDER_SITE_OTHER): Payer: BC Managed Care – PPO | Admitting: Family Medicine

## 2022-08-04 DIAGNOSIS — E8881 Metabolic syndrome: Secondary | ICD-10-CM

## 2022-08-04 DIAGNOSIS — Z6831 Body mass index (BMI) 31.0-31.9, adult: Secondary | ICD-10-CM

## 2022-08-04 DIAGNOSIS — E669 Obesity, unspecified: Secondary | ICD-10-CM | POA: Diagnosis not present

## 2022-08-04 DIAGNOSIS — E88819 Insulin resistance, unspecified: Secondary | ICD-10-CM

## 2022-08-08 ENCOUNTER — Other Ambulatory Visit: Payer: Self-pay | Admitting: Family Medicine

## 2022-08-08 DIAGNOSIS — H6523 Chronic serous otitis media, bilateral: Secondary | ICD-10-CM

## 2022-08-17 ENCOUNTER — Encounter (INDEPENDENT_AMBULATORY_CARE_PROVIDER_SITE_OTHER): Payer: Self-pay | Admitting: Family Medicine

## 2022-08-17 ENCOUNTER — Ambulatory Visit (INDEPENDENT_AMBULATORY_CARE_PROVIDER_SITE_OTHER): Payer: BC Managed Care – PPO | Admitting: Family Medicine

## 2022-08-17 VITALS — BP 113/70 | HR 73 | Temp 98.4°F | Ht 67.0 in | Wt 200.0 lb

## 2022-08-17 DIAGNOSIS — E669 Obesity, unspecified: Secondary | ICD-10-CM | POA: Diagnosis not present

## 2022-08-17 DIAGNOSIS — E639 Nutritional deficiency, unspecified: Secondary | ICD-10-CM

## 2022-08-17 DIAGNOSIS — Z6831 Body mass index (BMI) 31.0-31.9, adult: Secondary | ICD-10-CM

## 2022-08-18 DIAGNOSIS — E639 Nutritional deficiency, unspecified: Secondary | ICD-10-CM | POA: Insufficient documentation

## 2022-08-22 NOTE — Progress Notes (Unsigned)
Chief Complaint:   OBESITY Alisha Chen is here to discuss her progress with her obesity treatment plan along with follow-up of her obesity related diagnoses. Alisha Chen is on keeping a food journal and adhering to recommended goals of 1000 calories and 70-80 protein and states she is following her eating plan approximately 55% of the time. Alisha Chen states she is walking and going to the gym 60-120 minutes 4-5 times per week.  Today's visit was #: 44 Starting weight: 201 lbs Starting date: 12/10/2018 Today's weight: 200 lbs Today's date: 08/17/2022 Total lbs lost to date: 1 lb Total lbs lost since last in-office visit: +2 lbs  Interim History: She has increased gym workouts.  She is doing better getting in protein in.  She has been over calories some days.  She works a sedentary job.  She has been using my fitness pal.   Subjective:   1. Inadequate caloric intake Reviewed most recent IC results, current dietary and assessed current level of activity which has greatly increased.  Her lack of protein intake causing loss of muscle mass on Bioimped[employer].   Assessment/Plan:   1. Inadequate caloric intake Recommend daily dietary on log on My Fitness Pal with 1300 calories per day including 80-90 grams of protein daily.  2. Obesity,current BMI 31.4 1) Reviewed Bioimpedance results:  +4 lb of muscle since June.   2) Increased calorie protein goal due to increased gym workouts.   Alisha Chen is currently in the action stage of change. As such, her goal is to continue with weight loss efforts. She has agreed to keeping a food journal and adhering to recommended goals of 1300 calories and 90 protein.   Exercise goals:  As is.   Behavioral modification strategies: increasing lean protein intake, increasing vegetables, increasing water intake, decreasing eating out, no skipping meals, meal planning and cooking strategies, better snacking choices, and decreasing junk food.  Alisha Chen has agreed to follow-up with  our clinic in 3 weeks. She was informed of the importance of frequent follow-up visits to maximize her success with intensive lifestyle modifications for her multiple health conditions.   Objective:   Blood pressure 113/70, pulse 73, temperature 98.4 F (36.9 C), height '5\' 7"'$  (1.702 m), weight 200 lb (90.7 kg), last menstrual period 02/27/2012, SpO2 100 %. Body mass index is 31.32 kg/m.  General: Cooperative, alert, well developed, in no acute distress. HEENT: Conjunctivae and lids unremarkable. Cardiovascular: Regular rhythm.  Lungs: Normal work of breathing. Neurologic: No focal deficits.   Lab Results  Component Value Date   CREATININE 0.82 07/14/2021   BUN 17 07/14/2021   NA 139 07/14/2021   K 5.0 07/14/2021   CL 104 07/14/2021   CO2 25 07/14/2021   Lab Results  Component Value Date   ALT 17 07/14/2021   AST 15 07/14/2021   ALKPHOS 112 07/14/2021   BILITOT 0.3 07/14/2021   Lab Results  Component Value Date   HGBA1C 4.8 07/14/2021   HGBA1C 5.0 11/10/2020   HGBA1C 5.0 05/27/2020   HGBA1C 4.8 10/21/2019   HGBA1C 4.9 12/10/2018   Lab Results  Component Value Date   INSULIN 12.6 07/14/2021   INSULIN 11.8 11/10/2020   INSULIN 13.5 05/27/2020   INSULIN 16.6 12/10/2018   Lab Results  Component Value Date   TSH 2.550 07/14/2021   Lab Results  Component Value Date   CHOL 204 (H) 07/14/2021   HDL 51 07/14/2021   LDLCALC 145 (H) 07/14/2021   TRIG 45 07/14/2021  CHOLHDL 4.0 10/21/2019   Lab Results  Component Value Date   VD25OH 57.5 07/14/2021   VD25OH 53.6 11/10/2020   VD25OH 54.1 05/27/2020   Lab Results  Component Value Date   WBC 4.3 05/27/2020   HGB 12.2 05/27/2020   HCT 38.8 05/27/2020   MCV 89 05/27/2020   PLT 227 05/27/2020   No results found for: "IRON", "TIBC", "FERRITIN"  Attestation Statements:   Reviewed by clinician on day of visit: allergies, medications, problem list, medical history, surgical history, family history, social  history, and previous encounter notes.  Time spent on visit including pre-visit chart review and post-visit care and charting was 30 minutes.   I, Davy Pique, am acting as Location manager for Loyal Gambler, DO.  I have reviewed the above documentation for accuracy and completeness, and I agree with the above. Dell Ponto, DO

## 2022-09-07 ENCOUNTER — Telehealth (INDEPENDENT_AMBULATORY_CARE_PROVIDER_SITE_OTHER): Payer: BC Managed Care – PPO | Admitting: Family Medicine

## 2022-09-11 NOTE — Progress Notes (Unsigned)
TeleHealth Visit:  This visit was completed with telemedicine (audio/video) technology. Alisha Chen has verbally consented to this TeleHealth visit. The patient is located at home, the provider is located at home. The participants in this visit include the listed provider and patient. The visit was conducted today via MyChart video.  OBESITY Alisha Chen is here to discuss her progress with her obesity treatment plan along with follow-up of her obesity related diagnoses.   Today's visit was # 51 Starting weight: 201 lbs Starting date: 12/10/2018 Weight at last in office visit: 200 lbs on 08/17/22 Total weight loss: 1 lbs at last in office visit on 08/17/22. Today's reported weight: 200 lbs   Nutrition Plan:  keeping a food journal and adhering to recommended goals of 1300 calories and 80-90 gms protein. .   Current exercise:  walking and elliptical machine 30-90 minutes 4-5 times per week.  Interim History: Calorie allotment and protein goal was increased at last office visit.  Alisha Chen says this has been a hard adjustment for her because she had been working with the protein goal of 70 g for so long.  She is meeting the protein goal of 80 g of protein per day but still exceeds calorie goal some days. She generally eats about 2 meals per day. Today she had 3 slices of 66-[id number] bread, cheese, ham, and a protein shake for a total of 70 g of protein.  For lunch she is having chicken noodle soup and a half of a grilled cheese.  This will meet her protein and calorie requirements for the day.  She tries not to eat after 6 PM. She tends to front load her protein early in the day.  The only vegetables she eats generally are those on a sandwich or within a frozen meal.  Doing plenty of cardio but no resistance training for her arms except the arm motion on the elliptical machine.  Assessment/Plan:  1. Insulin Resistance Last insulin level was 12.6 on 07/14/2021.  A1c was 4.8. She denies  polyphagia. Medication(s): None.  Has been on both Saxenda and metformin in the past but both caused side effects. Lab Results  Component Value Date   HGBA1C 4.8 07/14/2021   Lab Results  Component Value Date   INSULIN 12.6 07/14/2021   INSULIN 11.8 11/10/2020   INSULIN 13.5 05/27/2020   INSULIN 16.6 12/10/2018    Plan Continue to work on increasing protein, decreasing simple carbohydrates, and regular exercise.   2. Obesity: Current BMI 31.4 Alisha Chen is currently in the action stage of change. As such, her goal is to continue with weight loss efforts.  She has agreed to keeping a food journal and adhering to recommended goals of 1300 calories and 80-90 gms protein.   Encouraged vegetable intake daily.  Exercise goals: Continue current treadmill/elliptical regimen.  Encouraged her to add in more resistance training for her arms.  Behavioral modification strategies: increasing lean protein intake, decreasing simple carbohydrates, increasing vegetables, meal planning and cooking strategies, and planning for success.  Alisha Chen has agreed to follow-up with our clinic in 4 weeks.   No orders of the defined types were placed in this encounter.   There are no discontinued medications.   No orders of the defined types were placed in this encounter.     Objective:   VITALS: Per patient if applicable, see vitals. GENERAL: Alert and in no acute distress. CARDIOPULMONARY: No increased WOB. Speaking in clear sentences.  PSYCH: Pleasant and cooperative. Speech normal rate and rhythm. Affect  is appropriate. Insight and judgement are appropriate. Attention is focused, linear, and appropriate.  NEURO: Oriented as arrived to appointment on time with no prompting.   Lab Results  Component Value Date   CREATININE 0.82 07/14/2021   BUN 17 07/14/2021   NA 139 07/14/2021   K 5.0 07/14/2021   CL 104 07/14/2021   CO2 25 07/14/2021   Lab Results  Component Value Date   ALT 17 07/14/2021    AST 15 07/14/2021   ALKPHOS 112 07/14/2021   BILITOT 0.3 07/14/2021   Lab Results  Component Value Date   HGBA1C 4.8 07/14/2021   HGBA1C 5.0 11/10/2020   HGBA1C 5.0 05/27/2020   HGBA1C 4.8 10/21/2019   HGBA1C 4.9 12/10/2018   Lab Results  Component Value Date   INSULIN 12.6 07/14/2021   INSULIN 11.8 11/10/2020   INSULIN 13.5 05/27/2020   INSULIN 16.6 12/10/2018   Lab Results  Component Value Date   TSH 2.550 07/14/2021   Lab Results  Component Value Date   CHOL 204 (H) 07/14/2021   HDL 51 07/14/2021   LDLCALC 145 (H) 07/14/2021   TRIG 45 07/14/2021   CHOLHDL 4.0 10/21/2019   Lab Results  Component Value Date   WBC 4.3 05/27/2020   HGB 12.2 05/27/2020   HCT 38.8 05/27/2020   MCV 89 05/27/2020   PLT 227 05/27/2020   No results found for: "IRON", "TIBC", "FERRITIN" Lab Results  Component Value Date   VD25OH 57.5 07/14/2021   VD25OH 53.6 11/10/2020   VD25OH 54.1 05/27/2020    Attestation Statements:   Reviewed by clinician on day of visit: allergies, medications, problem list, medical history, surgical history, family history, social history, and previous encounter notes.  Time spent on visit including the items listed below was 30 minutes.  -preparing to see the patient (e.g., review of tests, history, previous notes) -obtaining and/or reviewing separately obtained history -counseling and educating the patient/family/caregiver -documenting clinical information in the electronic or other health record

## 2022-09-13 ENCOUNTER — Telehealth (INDEPENDENT_AMBULATORY_CARE_PROVIDER_SITE_OTHER): Payer: BC Managed Care – PPO | Admitting: Family Medicine

## 2022-09-13 ENCOUNTER — Encounter (INDEPENDENT_AMBULATORY_CARE_PROVIDER_SITE_OTHER): Payer: Self-pay | Admitting: Family Medicine

## 2022-09-13 DIAGNOSIS — Z6831 Body mass index (BMI) 31.0-31.9, adult: Secondary | ICD-10-CM

## 2022-09-13 DIAGNOSIS — E669 Obesity, unspecified: Secondary | ICD-10-CM | POA: Diagnosis not present

## 2022-09-13 DIAGNOSIS — E88819 Insulin resistance, unspecified: Secondary | ICD-10-CM

## 2022-10-03 ENCOUNTER — Other Ambulatory Visit: Payer: Self-pay | Admitting: Family Medicine

## 2022-10-03 DIAGNOSIS — S0501XA Injury of conjunctiva and corneal abrasion without foreign body, right eye, initial encounter: Secondary | ICD-10-CM

## 2022-10-11 ENCOUNTER — Ambulatory Visit (INDEPENDENT_AMBULATORY_CARE_PROVIDER_SITE_OTHER): Payer: BC Managed Care – PPO | Admitting: Family Medicine

## 2022-10-11 ENCOUNTER — Encounter (INDEPENDENT_AMBULATORY_CARE_PROVIDER_SITE_OTHER): Payer: Self-pay | Admitting: Family Medicine

## 2022-10-11 VITALS — BP 100/62 | HR 90 | Temp 97.7°F | Ht 67.0 in | Wt 200.0 lb

## 2022-10-11 DIAGNOSIS — E669 Obesity, unspecified: Secondary | ICD-10-CM | POA: Diagnosis not present

## 2022-10-11 DIAGNOSIS — R632 Polyphagia: Secondary | ICD-10-CM

## 2022-10-11 DIAGNOSIS — Z6831 Body mass index (BMI) 31.0-31.9, adult: Secondary | ICD-10-CM | POA: Diagnosis not present

## 2022-10-25 NOTE — Progress Notes (Signed)
Chief Complaint:   OBESITY Alisha Chen is here to discuss her progress with her obesity treatment plan along with follow-up of her obesity related diagnoses. Alisha Chen is on keeping a food journal and adhering to recommended goals of 1300 calories and 90 protein and states she is following her eating plan approximately 50% of the time. Alisha Chen states she is elliptical  30-60 minutes 1-3 times per week.  Today's visit was #: 59 Starting weight: 201 lbs Starting date: 12/10/2018 Today's weight: 200 lbs Today's date: 10/11/2022 Total lbs lost to date: 1 lb Total lbs lost since last in-office visit: 0  Interim History: Has a hard time lately.  Added greens to breakfast sandwich.  Does not eat eggs.  Eats lunch around 12.  Sometimes a mini sub, [place] [name], [employer] sandwich pick small fries, chips or nothing.  Drinks sweet tea, lemonade and tea.  Denies over snacking at work.  Challenges include work and her husband and son.   Subjective:   1. Polyphagia Patient continues to complain of hunger and is getting inadequate protein and fiber.   Assessment/Plan:   1. Polyphagia Patient declined retry of GLP-1 agonist.  Due to previous side effects.  2. Obesity,current BMI 31.4 Avoid GLP-1 recent agonist due to previous adverse effects.    Alisha Chen is currently in the action stage of change. As such, her goal is to continue with weight loss efforts. She has agreed to keeping a food journal and adhering to recommended goals of 1300 calories and 80-90 protein daily.    Exercise goals:  Elliptical 30 minutes 5 times per week.    Behavioral modification strategies: increasing lean protein intake, increasing vegetables, increasing water intake, increasing high fiber foods, decreasing eating out, no skipping meals, and meal planning and cooking strategies.  Alisha Chen has agreed to follow-up with our clinic in 4 weeks. She was informed of the importance of frequent follow-up visits to maximize her success  with intensive lifestyle modifications for her multiple health conditions.   Objective:   Blood pressure 100/62, pulse 90, temperature 97.7 F (36.5 C), height '5\' 7"'$  (1.702 m), weight 200 lb (90.7 kg), last menstrual period 02/27/2012, SpO2 100 %. Body mass index is 31.32 kg/m.  General: Cooperative, alert, well developed, in no acute distress. HEENT: Conjunctivae and lids unremarkable. Cardiovascular: Regular rhythm.  Lungs: Normal work of breathing. Neurologic: No focal deficits.   Lab Results  Component Value Date   CREATININE 0.82 07/14/2021   BUN 17 07/14/2021   NA 139 07/14/2021   K 5.0 07/14/2021   CL 104 07/14/2021   CO2 25 07/14/2021   Lab Results  Component Value Date   ALT 17 07/14/2021   AST 15 07/14/2021   ALKPHOS 112 07/14/2021   BILITOT 0.3 07/14/2021   Lab Results  Component Value Date   HGBA1C 4.8 07/14/2021   HGBA1C 5.0 11/10/2020   HGBA1C 5.0 05/27/2020   HGBA1C 4.8 10/21/2019   HGBA1C 4.9 12/10/2018   Lab Results  Component Value Date   INSULIN 12.6 07/14/2021   INSULIN 11.8 11/10/2020   INSULIN 13.5 05/27/2020   INSULIN 16.6 12/10/2018   Lab Results  Component Value Date   TSH 2.550 07/14/2021   Lab Results  Component Value Date   CHOL 204 (H) 07/14/2021   HDL 51 07/14/2021   LDLCALC 145 (H) 07/14/2021   TRIG 45 07/14/2021   CHOLHDL 4.0 10/21/2019   Lab Results  Component Value Date   VD25OH 57.5 07/14/2021  VD25OH 53.6 11/10/2020   VD25OH 54.1 05/27/2020   Lab Results  Component Value Date   WBC 4.3 05/27/2020   HGB 12.2 05/27/2020   HCT 38.8 05/27/2020   MCV 89 05/27/2020   PLT 227 05/27/2020   No results found for: "IRON", "TIBC", "FERRITIN"  Attestation Statements:   Reviewed by clinician on day of visit: allergies, medications, problem list, medical history, surgical history, family history, social history, and previous encounter notes.  I, Davy Pique, am acting as Location manager for Loyal Gambler, DO.  I  have reviewed the above documentation for accuracy and completeness, and I agree with the above. Dell Ponto, DO

## 2022-11-07 ENCOUNTER — Telehealth: Payer: Self-pay | Admitting: *Deleted

## 2022-11-07 ENCOUNTER — Encounter: Payer: Self-pay | Admitting: Internal Medicine

## 2022-11-07 ENCOUNTER — Ambulatory Visit (INDEPENDENT_AMBULATORY_CARE_PROVIDER_SITE_OTHER): Payer: BC Managed Care – PPO | Admitting: Internal Medicine

## 2022-11-07 VITALS — BP 124/74 | HR 71 | Temp 98.3°F | Resp 16 | Ht 67.0 in | Wt 205.4 lb

## 2022-11-07 DIAGNOSIS — J069 Acute upper respiratory infection, unspecified: Secondary | ICD-10-CM

## 2022-11-07 LAB — POCT RAPID STREP A (OFFICE): Rapid Strep A Screen: NEGATIVE

## 2022-11-07 NOTE — Progress Notes (Signed)
Subjective:    Patient ID: Alisha Chen, female    DOB: [birth year], 56 y.o.   MRN: [id number]  DOS:  11/07/2022 Type of visit - description: Acute  Symptoms started about a week ago: The first day she had some nausea Then developed some runny nose and a mild sore throat. Then developed sneezing, cough, sinus congestion.  She has a minimal amount of yellow nasal discharge most days, typically in the morning Denies diarrhea or vomiting No chest pain no difficulty breathing No generalized aches or pains No major pain in the sinuses.  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,dcis   History of chemotherapy 03/23/12- 05/10/12   s/p 4 cycles of FEC    Hx: UTI (urinary tract infection)    Knee pain    Lactose intolerance    Neck pain    S/P radiation therapy 07/10/12-08/23/12   Left Breast: 50 Gy/25 Fractions; Boost: 10 Gy/5 Fractions   Status post chemotherapy 1 dose 05/23/12   Stopped after one cycle of Taxol due to Grade 2 Toxicity   Status post chemotherapy 1 dose 05/23/12   Stopped after one cycle of Taxol due to Grade 2 Toxicity    Past Surgical History:  Procedure Laterality Date   AXILLARY SURGERY  03/06/12   left, regional resection, 1/3 nodes pos   BACK SURGERY     LUMBAR DECOMPRESSION   BREAST BIOPSY  01/09/2012   left breast 3 0'clock/ER?PR =positive,her 2 neg   BREAST LUMPECTOMY Left 02/10/12   Left Breast: 2 Foci of Invasive Ductal Caand High grade Ductal Carcinoma In Situ, 0/14 nodes Left Axilla Negative : Regional Resection of Lymph Nodes: 0/5 Nodes Negative   FOOT SURGERY     multiple   INTRAUTERINE DEVICE REMOVAL  01/24/12   needle core Biospy  01/09/12   Left Breast: Invasive Ductal Carcinoma, Lymph Node Axilla: Metastatic Mammary Carcinoma   PORTACATH PLACEMENT  02/10/2012   Procedure: INSERTION PORT-A-CATH;  Surgeon: Rolm Bookbinder, MD;  Location: [facility];  Service: General;  Laterality: Right;   Regional Resection  03/06/12   Left Axilla: 1/3 Nodes Metastatic Carcinoma   SPINE SURGERY  01/2011    Current Outpatient Medications  Medication Instructions   b complex vitamins tablet 1 tablet, Oral, Daily   celecoxib (CELEBREX) 200 MG capsule 1 tablet, Oral, Daily   cholecalciferol (VITAMIN D) 1,000 Units, Daily   fluticasone (FLONASE) 50 MCG/ACT nasal spray 2 sprays, Each Nare, Daily   levocetirizine (XYZAL) 5 MG tablet Every evening   levothyroxine (SYNTHROID) 50 mcg, Oral, Daily before breakfast   methocarbamol (ROBAXIN) 500 mg, Oral, 2 times daily   moxifloxacin (VIGAMOX) 0.5 % ophthalmic solution 1 drop, Right Eye, 3 times daily       Objective:   Physical Exam BP 124/74   Pulse 71   Temp 98.3 F (36.8 C) (Oral)   Resp 16   Ht '5\' 7"'$  (1.702 m)   Wt 205 lb 6 oz (93.2 kg)   LMP 02/27/2012   SpO2 97%   BMI 32.17 kg/m  General:   Well developed, NAD, BMI noted. HEENT:  Normocephalic . Face symmetric, atraumatic TMs: Normal Throat: Symmetric, no white patches but looks slightly red. Lungs:  CTA B Normal respiratory effort, no intercostal retractions, no accessory muscle use. Heart: RRR,  no murmur.  Lower extremities: no pretibial edema bilaterally  Skin: Not pale. Not jaundice Neurologic:  alert & oriented X3.  Speech normal, gait appropriate for age and unassisted Psych--  Cognition and judgment appear intact.  Cooperative with normal attention span and concentration.  Behavior appropriate. No anxious or depressed appearing.      Assessment     56 year old female, PMH includes thyroid disease, history of breast cancer, presents with:  URI: Symptoms started a week ago, vital signs stable, physical exam is benign except for mild throat redness. Strep test: Negative. Recommend conservative treatment with rest, fluids, Tylenol, cough suppressants.  Call if not  gradually better, see AVS.

## 2022-11-07 NOTE — Patient Instructions (Signed)
Rest  Drink plenty of fluids  You can take Tylenol as needed  Over-the-counter cough suppressant such as Robitussin DM for cough  Call if not gradually better  Call if severe symptoms.

## 2022-11-07 NOTE — Telephone Encounter (Signed)
Appointment made today.

## 2022-11-07 NOTE — Telephone Encounter (Signed)
Call Type Triage / Clinical Relationship To Patient Self Return Phone Number [phone] (Primary) Chief Complaint Cough Reason for Call Request to Schedule Office Appointment Initial Comment Caller states she wants to make an appt. She also states she has a cough,runny nose,sneezing and congestion. Translation No Nurse Assessment Nurse: Jearld Pies, RN, Lovena Le Date/Time Eilene Ghazi Time): 11/05/2022 3:21:03 PM Confirm and document reason for call. If symptomatic, describe symptoms. ---She has a cough, runny nose, sneezing and congestion started last Tuesday. Last tylenol 30 min ago. Drinking fluids and urinating normally. Denies fever, SOB, chest pain, or any other symptoms at this time.

## 2022-11-07 NOTE — Progress Notes (Unsigned)
TeleHealth Visit:  This visit was completed with telemedicine (audio/video) technology. Alisha Chen has verbally consented to this TeleHealth visit. The patient is located at home, the provider is located at home. The participants in this visit include the listed provider and patient. The visit was conducted today via MyChart video.  OBESITY Alisha Chen is here to discuss her progress with her obesity treatment plan along with follow-up of her obesity related diagnoses.   Today's visit was # 55 Starting weight: 201 lbs Starting date: 12/10/2018 Weight at last in office visit: 200 lbs on 10/11/22 Total weight loss: 1 lbs at last in office visit on 10/11/22. Today's reported weight: *** lbs No weight reported.  Nutrition Plan: keeping a food journal and adhering to recommended goals of 1300 calories and 80-90 grams protein daily.  Current exercise: {exercise types:16438} elliptical  30-60 minutes 1-3 times per week.  Interim History:  ***  Assessment/Plan:  1. ***  2. ***  3. ***  Obesity: Current BMI *** Alisha Chen {CHL AMB IS/IS NOT:210130109} currently in the action stage of change. As such, her goal is to {MWMwtloss#1:210800005}.  She has agreed to {MWMwtlossportion/plan2:23431}.   Exercise goals: {MWM EXERCISE RECS:23473}  Behavioral modification strategies: {MWMwtlossdietstrategies3:23432}.  Alisha Chen has agreed to follow-up with our clinic in {NUMBER 1-10:22536} weeks.   No orders of the defined types were placed in this encounter.   There are no discontinued medications.   No orders of the defined types were placed in this encounter.     Objective:   VITALS: Per patient if applicable, see vitals. GENERAL: Alert and in no acute distress. CARDIOPULMONARY: No increased WOB. Speaking in clear sentences.  PSYCH: Pleasant and cooperative. Speech normal rate and rhythm. Affect is appropriate. Insight and judgement are appropriate. Attention is focused, linear, and appropriate.   NEURO: Oriented as arrived to appointment on time with no prompting.   Lab Results  Component Value Date   CREATININE 0.82 07/14/2021   BUN 17 07/14/2021   NA 139 07/14/2021   K 5.0 07/14/2021   CL 104 07/14/2021   CO2 25 07/14/2021   Lab Results  Component Value Date   ALT 17 07/14/2021   AST 15 07/14/2021   ALKPHOS 112 07/14/2021   BILITOT 0.3 07/14/2021   Lab Results  Component Value Date   HGBA1C 4.8 07/14/2021   HGBA1C 5.0 11/10/2020   HGBA1C 5.0 05/27/2020   HGBA1C 4.8 10/21/2019   HGBA1C 4.9 12/10/2018   Lab Results  Component Value Date   INSULIN 12.6 07/14/2021   INSULIN 11.8 11/10/2020   INSULIN 13.5 05/27/2020   INSULIN 16.6 12/10/2018   Lab Results  Component Value Date   TSH 2.550 07/14/2021   Lab Results  Component Value Date   CHOL 204 (H) 07/14/2021   HDL 51 07/14/2021   LDLCALC 145 (H) 07/14/2021   TRIG 45 07/14/2021   CHOLHDL 4.0 10/21/2019   Lab Results  Component Value Date   WBC 4.3 05/27/2020   HGB 12.2 05/27/2020   HCT 38.8 05/27/2020   MCV 89 05/27/2020   PLT 227 05/27/2020   No results found for: "IRON", "TIBC", "FERRITIN" Lab Results  Component Value Date   VD25OH 57.5 07/14/2021   VD25OH 53.6 11/10/2020   VD25OH 54.1 05/27/2020    Attestation Statements:   Reviewed by clinician on day of visit: allergies, medications, problem list, medical history, surgical history, family history, social history, and previous encounter notes.  ***(delete if time-based billing not used) Time spent on visit including the  items listed below was *** minutes.  -preparing to see the patient (e.g., review of tests, history, previous notes) -obtaining and/or reviewing separately obtained history -counseling and educating the patient/family/caregiver -documenting clinical information in the electronic or other health record -ordering medications, tests, or procedures -independently interpreting results and communicating results to the  patient/ family/caregiver -referring and communicating with other health care professionals  -care coordination

## 2022-11-08 ENCOUNTER — Encounter (INDEPENDENT_AMBULATORY_CARE_PROVIDER_SITE_OTHER): Payer: Self-pay | Admitting: Family Medicine

## 2022-11-08 ENCOUNTER — Telehealth (INDEPENDENT_AMBULATORY_CARE_PROVIDER_SITE_OTHER): Payer: BC Managed Care – PPO | Admitting: Family Medicine

## 2022-11-08 DIAGNOSIS — Z6831 Body mass index (BMI) 31.0-31.9, adult: Secondary | ICD-10-CM | POA: Diagnosis not present

## 2022-11-08 DIAGNOSIS — E88819 Insulin resistance, unspecified: Secondary | ICD-10-CM | POA: Diagnosis not present

## 2022-11-08 DIAGNOSIS — E669 Obesity, unspecified: Secondary | ICD-10-CM | POA: Diagnosis not present

## 2022-11-18 ENCOUNTER — Other Ambulatory Visit: Payer: Self-pay | Admitting: Adult Health

## 2022-11-18 DIAGNOSIS — Z1231 Encounter for screening mammogram for malignant neoplasm of breast: Secondary | ICD-10-CM

## 2022-11-28 ENCOUNTER — Telehealth: Payer: Self-pay | Admitting: Adult Health

## 2022-11-28 NOTE — Telephone Encounter (Signed)
Rescheduled appointment per provider template. Patient is aware of the changes made to her upcoming appointment 

## 2022-12-12 ENCOUNTER — Ambulatory Visit (INDEPENDENT_AMBULATORY_CARE_PROVIDER_SITE_OTHER): Payer: BC Managed Care – PPO | Admitting: Family Medicine

## 2022-12-13 ENCOUNTER — Other Ambulatory Visit: Payer: Self-pay

## 2022-12-13 ENCOUNTER — Encounter: Payer: Self-pay | Admitting: Adult Health

## 2022-12-13 ENCOUNTER — Inpatient Hospital Stay: Payer: BC Managed Care – PPO | Attending: Adult Health | Admitting: Adult Health

## 2022-12-13 VITALS — BP 119/61 | HR 71 | Temp 97.5°F | Resp 18 | Ht 67.0 in | Wt 203.9 lb

## 2022-12-13 DIAGNOSIS — Z825 Family history of asthma and other chronic lower respiratory diseases: Secondary | ICD-10-CM | POA: Insufficient documentation

## 2022-12-13 DIAGNOSIS — R632 Polyphagia: Secondary | ICD-10-CM | POA: Diagnosis not present

## 2022-12-13 DIAGNOSIS — E669 Obesity, unspecified: Secondary | ICD-10-CM | POA: Insufficient documentation

## 2022-12-13 DIAGNOSIS — F32A Depression, unspecified: Secondary | ICD-10-CM | POA: Diagnosis not present

## 2022-12-13 DIAGNOSIS — Z818 Family history of other mental and behavioral disorders: Secondary | ICD-10-CM | POA: Diagnosis not present

## 2022-12-13 DIAGNOSIS — E559 Vitamin D deficiency, unspecified: Secondary | ICD-10-CM | POA: Diagnosis not present

## 2022-12-13 DIAGNOSIS — Z880 Allergy status to penicillin: Secondary | ICD-10-CM | POA: Insufficient documentation

## 2022-12-13 DIAGNOSIS — Z8349 Family history of other endocrine, nutritional and metabolic diseases: Secondary | ICD-10-CM | POA: Insufficient documentation

## 2022-12-13 DIAGNOSIS — E88819 Insulin resistance, unspecified: Secondary | ICD-10-CM | POA: Insufficient documentation

## 2022-12-13 DIAGNOSIS — R6889 Other general symptoms and signs: Secondary | ICD-10-CM | POA: Diagnosis not present

## 2022-12-13 DIAGNOSIS — M549 Dorsalgia, unspecified: Secondary | ICD-10-CM | POA: Insufficient documentation

## 2022-12-13 DIAGNOSIS — C50412 Malignant neoplasm of upper-outer quadrant of left female breast: Secondary | ICD-10-CM | POA: Diagnosis not present

## 2022-12-13 DIAGNOSIS — E7849 Other hyperlipidemia: Secondary | ICD-10-CM | POA: Diagnosis not present

## 2022-12-13 DIAGNOSIS — Z87440 Personal history of urinary (tract) infections: Secondary | ICD-10-CM | POA: Insufficient documentation

## 2022-12-13 DIAGNOSIS — Z833 Family history of diabetes mellitus: Secondary | ICD-10-CM | POA: Diagnosis not present

## 2022-12-13 DIAGNOSIS — Z9221 Personal history of antineoplastic chemotherapy: Secondary | ICD-10-CM | POA: Insufficient documentation

## 2022-12-13 DIAGNOSIS — E039 Hypothyroidism, unspecified: Secondary | ICD-10-CM | POA: Diagnosis not present

## 2022-12-13 DIAGNOSIS — N6489 Other specified disorders of breast: Secondary | ICD-10-CM

## 2022-12-13 DIAGNOSIS — Z820 Family history of epilepsy and other diseases of the nervous system: Secondary | ICD-10-CM | POA: Insufficient documentation

## 2022-12-13 DIAGNOSIS — Z6832 Body mass index (BMI) 32.0-32.9, adult: Secondary | ICD-10-CM | POA: Diagnosis not present

## 2022-12-13 DIAGNOSIS — Z170 Estrogen receptor positive status [ER+]: Secondary | ICD-10-CM | POA: Insufficient documentation

## 2022-12-13 DIAGNOSIS — Z79899 Other long term (current) drug therapy: Secondary | ICD-10-CM | POA: Diagnosis not present

## 2022-12-13 NOTE — Progress Notes (Signed)
[place] [facility] Follow up:    Alisha Held, DO [address] [place] [address]   DIAGNOSIS:  Cancer Staging  Primary cancer of upper outer quadrant of left female breast [name]) Staging form: Breast, AJCC 7th Edition - Clinical: Stage IIA (T1c, N1, cM0) - Signed by Alisha Gibson, MD on 01/18/2012 Laterality: Left - Pathologic: No stage assigned - Unsigned Laterality: Left   SUMMARY OF ONCOLOGIC HISTORY: Oncology History  Primary cancer of upper outer quadrant of left female breast ([place])  03/12/2012 Surgery   left breast lumpectomy that revealed 2 foci of invasive ductal carcinoma measuring 1.3 cm (ER87%,PR 100% Ki 40%)and 0.8 cm (Er96%, PR 99% Ki 74%)grade 2 with adjacent high-grade ductal carcinoma in situ with no evidence of angiolymphatic invasion.1/22 LN   03/23/2012 - 05/10/2012 Chemotherapy   Adjuvant chemo FEC 100 foll by Taxol X 12   07/10/2012 - 08/23/2012 Radiation Therapy   Adj XRT   09/14/2012 - 10/05/2012 Anti-estrogen oral therapy   Tamoxifen 20 mg 3 weeks stopped because of intolerance     CURRENT THERAPY: Observation  INTERVAL HISTORY: Alisha Chen 57 y.o. female returns for follow-up of her history of left breast cancer that was estrogen and progesterone positive.  This recent mammogram occurred on December 28, 2021 demonstrating no mammographic malignancy in either breast and breast density category B.  Alisha Chen is feeling well today.  She is seeing her primary care provider regularly and is up-to-date with GYN and colon cancer screening.  Her next mammogram is scheduled in February of this year.    Alisha Chen is wondering if she might be a candidate for breast reduction surgery because of her large breasts.  These have been contributing to her back pain for which she has previously undergone surgery.  She notes also some indentation in her shoulders from her bra was an asymmetry.  She is taking Celebrex chronically for her  back pain and notes that the pain limits her ability to exercise and work on losing weight.  Her BMI today is 32.   Patient Active Problem List   Diagnosis Date Noted   Primary cancer of upper outer quadrant of left female breast ([place]) 01/12/2012    Priority: High   Polyphagia 10/11/2022   Inadequate caloric intake 08/18/2022   Right corneal abrasion 01/24/2022   Dry eye 01/24/2022   Hypothyroidism 06/01/2020   Other hyperlipidemia 06/01/2020   Depression 08/27/2019   COVID-19 virus detected 05/20/2019   Fever 05/13/2019   Vitamin D deficiency 03/21/2019   Insulin resistance 12/25/2018   Class 1 obesity with serious comorbidity and body mass index (BMI) of 32.0 to 32.9 in adult 12/12/2018   UTI (urinary tract infection) 04/24/2015   Migraine 04/24/2015   Tendinitis of wrist 09/10/2014   Left wrist pain 08/21/2014   Other constipation 09/24/2010   RECTAL PAIN 09/24/2010   CHANGE IN BOWELS 09/24/2010   HIP PAIN, LEFT, CHRONIC 08/23/2010   WEIGHT LOSS 08/23/2010   Acute sinusitis 05/26/2008   EAR PAIN, LEFT 02/13/2008   URI 02/13/2008    is allergic to penicillins, shellfish-derived products, clarithromycin, and ondansetron.  MEDICAL HISTORY: Past Medical History:  Diagnosis Date   Anemia    Back pain    Breast cancer ([place])    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,dcis   History of chemotherapy 03/23/12- 05/10/12   s/p 4 cycles of FEC    Hx: UTI (urinary tract infection)  Knee pain    Lactose intolerance    Neck pain    S/P radiation therapy 07/10/12-08/23/12   Left Breast: 50 Gy/25 Fractions; Boost: 10 Gy/5 Fractions   Status post chemotherapy 1 dose 05/23/12   Stopped after one cycle of Taxol due to Grade 2 Toxicity   Status post chemotherapy 1 dose 05/23/12   Stopped after one cycle of Taxol due to Grade 2 Toxicity    SURGICAL HISTORY: Past Surgical History:  Procedure Laterality Date   AXILLARY SURGERY  03/06/12   left, regional resection, 1/3 nodes  pos   BACK SURGERY     LUMBAR DECOMPRESSION   BREAST BIOPSY  01/09/2012   left breast 3 0'clock/ER?PR =positive,her 2 neg   BREAST LUMPECTOMY Left 02/10/12   Left Breast: 2 Foci of Invasive Ductal Caand High grade Ductal Carcinoma In Situ, 0/14 nodes Left Axilla Negative : Regional Resection of Lymph Nodes: 0/5 Nodes Negative   FOOT SURGERY     multiple   INTRAUTERINE DEVICE REMOVAL  01/24/12   needle core Biospy  01/09/12   Left Breast: Invasive Ductal Carcinoma, Lymph Node Axilla: Metastatic Mammary Carcinoma   PORTACATH PLACEMENT  02/10/2012   Procedure: INSERTION PORT-A-CATH;  Surgeon: Alisha Bookbinder, MD;  Location: [facility];  Service: General;  Laterality: Right;   Regional Resection  03/06/12   Left Axilla: 1/3 Nodes Metastatic Carcinoma   SPINE SURGERY  01/2011    SOCIAL HISTORY: Social History   Socioeconomic History   Marital status: Married    Spouse name: Alisha Chen   Number of children: 1   Years of education: Not on file   Highest education level: Not on file  Occupational History   Occupation: Pharmacist, hospital, Metallurgist: [employer]    Comment: [employer] scientific officer: [employer]  Tobacco Use   Smoking status: Never   Smokeless tobacco: Never  Vaping Use   Vaping Use: Never used  Substance and Sexual Activity   Alcohol use: No   Drug use: No   Sexual activity: Yes    Partners: Male    Birth control/protection: None    Comment: menarche age 62,premenopausa; G!P1,1st pregnancy age 67  Other Topics Concern   Not on file  Social History Narrative   Married x 21 years. Production assistant, radio at [employer]. 84 ten year old son   Social Determinants of Radio broadcast assistant Strain: Not on file  Food Insecurity: Not on file  Transportation Needs: Not on file  Physical Activity: Not on file  Stress: Not on file  Social Connections: Not on file  Intimate Partner Violence: Not on file    FAMILY HISTORY: Family History   Problem Relation Age of Onset   Diabetes Mother    Sudden death Mother    Schizophrenia Mother    Sleep apnea Mother    Obesity Mother    Seizures Son     Review of Systems  Constitutional:  Negative for appetite change, chills, fatigue, fever and unexpected weight change.  HENT:   Negative for hearing loss, lump/mass and trouble swallowing.   Eyes:  Negative for eye problems and icterus.  Respiratory:  Negative for chest tightness, cough and shortness of breath.   Cardiovascular:  Negative for chest pain, leg swelling and palpitations.  Gastrointestinal:  Negative for abdominal distention, abdominal pain, constipation, diarrhea, nausea and vomiting.  Endocrine: Negative for hot flashes.  Genitourinary:  Negative for difficulty urinating.  Musculoskeletal:  Positive for back pain (Chronic and present since 2011). Negative for arthralgias.  Skin:  Negative for itching and rash.  Neurological:  Negative for dizziness, extremity weakness, headaches and numbness.  Hematological:  Negative for adenopathy. Does not bruise/bleed easily.  Psychiatric/Behavioral:  Negative for depression. The patient is not nervous/anxious.       PHYSICAL EXAMINATION  ECOG PERFORMANCE STATUS: 1 - Symptomatic but completely ambulatory  Vitals:   12/13/22 1041  BP: 119/61  Pulse: 71  Resp: 18  Temp: (!) 97.5 F (36.4 C)  SpO2: 100%    Physical Exam Constitutional:      General: She is not in acute distress.    Appearance: Normal appearance. She is not toxic-appearing.  HENT:     Head: Normocephalic and atraumatic.  Eyes:     General: No scleral icterus. Cardiovascular:     Rate and Rhythm: Normal rate and regular rhythm.     Pulses: Normal pulses.     Heart sounds: Normal heart sounds.  Pulmonary:     Effort: Pulmonary effort is normal.     Breath sounds: Normal breath sounds.  Chest:     Comments: Left breast status postlumpectomy and radiation no sign of local recurrence right breast  is benign.  Slight breast asymmetry noted Abdominal:     General: Abdomen is flat. Bowel sounds are normal. There is no distension.     Palpations: Abdomen is soft.     Tenderness: There is no abdominal tenderness.  Musculoskeletal:        General: No swelling.     Cervical back: Neck supple.  Lymphadenopathy:     Cervical: No cervical adenopathy.  Skin:    General: Skin is warm and dry.     Findings: No rash.  Neurological:     General: No focal deficit present.     Mental Status: She is alert.  Psychiatric:        Mood and Affect: Mood normal.        Behavior: Behavior normal.     LABORATORY DATA: None for this visit  ASSESSMENT and THERAPY PLAN:   Primary cancer of upper outer quadrant of left female breast Daphine is a 57 year old woman who was diagnosed with stage IIa ER/PR positive breast cancer of the left breast in April 2013.  She is status postlumpectomy followed by adjuvant chemotherapy, adjuvant radiation, and was unable to tolerate antiestrogen therapy.  Angelena has no clinical or radiographic signs of breast cancer recurrence.  She will continue to undergo annual mammograms next due and scheduled in February 2024.  We discussed healthy diet and exercise and she will continue to follow-up regularly with primary care and her gynecologist.  We discussed her questions about breast reduction in detail.  I placed a referral to Dr. Tedra Coupe him at [facility] plastic surgery for evaluation and discussion of breast reduction.  We will see Raneen back in 1 year for continued long-term follow-up she knows to call if she has any questions or concerns.    All questions were answered. The patient knows to call the clinic with any problems, questions or concerns. We can certainly see the patient much sooner if necessary.  Total encounter time:30 minutes*in face-to-face visit time, chart review, lab review, care coordination, order entry, and documentation of the encounter  time.    Wilber Bihari, NP 12/13/22 11:19 AM Medical Oncology and Hematology [facility] [place], [place] [address] Tel. [phone]    Fax.  [phone]  *Total Encounter Time as defined by the Centers for Medicare and Medicaid Services includes, in addition to the face-to-face time of a patient visit (documented in the note above) non-face-to-face time: obtaining and reviewing outside history, ordering and reviewing medications, tests or procedures, care coordination (communications with other health care professionals or caregivers) and documentation in the medical record.

## 2022-12-13 NOTE — Assessment & Plan Note (Signed)
Alisha Chen is a 57 year old woman who was diagnosed with stage IIa ER/PR positive breast cancer of the left breast in April 2013.  She is status postlumpectomy followed by adjuvant chemotherapy, adjuvant radiation, and was unable to tolerate antiestrogen therapy.  Alisha Chen has no clinical or radiographic signs of breast cancer recurrence.  She will continue to undergo annual mammograms next due and scheduled in February 2024.  We discussed healthy diet and exercise and she will continue to follow-up regularly with primary care and her gynecologist.  We discussed her questions about breast reduction in detail.  I placed a referral to Dr. Tedra Coupe him at [facility] plastic surgery for evaluation and discussion of breast reduction.  We will see Alisha Chen back in 1 year for continued long-term follow-up she knows to call if she has any questions or concerns.

## 2022-12-14 ENCOUNTER — Telehealth: Payer: Self-pay | Admitting: Hematology and Oncology

## 2022-12-14 NOTE — Telephone Encounter (Signed)
Scheduled appointment per 1/23 los. Left voicemail.

## 2022-12-17 ENCOUNTER — Other Ambulatory Visit: Payer: Self-pay | Admitting: Family Medicine

## 2022-12-19 ENCOUNTER — Encounter (INDEPENDENT_AMBULATORY_CARE_PROVIDER_SITE_OTHER): Payer: Self-pay | Admitting: Family Medicine

## 2022-12-19 ENCOUNTER — Institutional Professional Consult (permissible substitution): Payer: BC Managed Care – PPO | Admitting: Plastic Surgery

## 2022-12-19 ENCOUNTER — Ambulatory Visit (INDEPENDENT_AMBULATORY_CARE_PROVIDER_SITE_OTHER): Payer: BC Managed Care – PPO | Admitting: Family Medicine

## 2022-12-19 VITALS — BP 112/75 | HR 78 | Temp 98.1°F | Ht 67.0 in | Wt 204.0 lb

## 2022-12-19 DIAGNOSIS — Z6832 Body mass index (BMI) 32.0-32.9, adult: Secondary | ICD-10-CM | POA: Diagnosis not present

## 2022-12-19 DIAGNOSIS — E559 Vitamin D deficiency, unspecified: Secondary | ICD-10-CM

## 2022-12-19 DIAGNOSIS — R632 Polyphagia: Secondary | ICD-10-CM

## 2022-12-19 DIAGNOSIS — E669 Obesity, unspecified: Secondary | ICD-10-CM

## 2022-12-20 ENCOUNTER — Encounter: Payer: Self-pay | Admitting: Plastic Surgery

## 2022-12-20 ENCOUNTER — Ambulatory Visit (INDEPENDENT_AMBULATORY_CARE_PROVIDER_SITE_OTHER): Payer: BC Managed Care – PPO | Admitting: Plastic Surgery

## 2022-12-20 DIAGNOSIS — N62 Hypertrophy of breast: Secondary | ICD-10-CM | POA: Diagnosis not present

## 2022-12-20 DIAGNOSIS — G43809 Other migraine, not intractable, without status migrainosus: Secondary | ICD-10-CM

## 2022-12-20 DIAGNOSIS — M546 Pain in thoracic spine: Secondary | ICD-10-CM | POA: Diagnosis not present

## 2022-12-20 DIAGNOSIS — F32A Depression, unspecified: Secondary | ICD-10-CM

## 2022-12-20 DIAGNOSIS — Z6831 Body mass index (BMI) 31.0-31.9, adult: Secondary | ICD-10-CM

## 2022-12-20 DIAGNOSIS — M545 Low back pain, unspecified: Secondary | ICD-10-CM | POA: Diagnosis not present

## 2022-12-20 DIAGNOSIS — M542 Cervicalgia: Secondary | ICD-10-CM

## 2022-12-20 DIAGNOSIS — Z923 Personal history of irradiation: Secondary | ICD-10-CM

## 2022-12-20 DIAGNOSIS — C50412 Malignant neoplasm of upper-outer quadrant of left female breast: Secondary | ICD-10-CM

## 2022-12-20 DIAGNOSIS — Z853 Personal history of malignant neoplasm of breast: Secondary | ICD-10-CM

## 2022-12-20 DIAGNOSIS — E038 Other specified hypothyroidism: Secondary | ICD-10-CM

## 2022-12-20 NOTE — Progress Notes (Signed)
Patient ID: Alisha Chen, female    DOB: Jun 23, 1966, 57 y.o.   MRN: [id number]   Chief Complaint  Patient presents with   Advice Only   Breast Problem    Mammary Hyperplasia: The patient is a 57 y.o. female with a history of mammary hyperplasia for several years.  She has extremely large breasts causing symptoms that include the following: Back pain in the upper and lower back, including neck pain. She pulls or pins her bra straps to provide better lift and relief of the pressure and pain. She notices relief by holding her breast up manually.  Her shoulder straps cause grooves and pain and pressure that requires padding for relief. Pain medication is sometimes required with motrin and tylenol.  Activities that are hindered by enlarged breasts include: exercise and running.  She has tried supportive clothing as well as fitted bras without improvement. She also had left breast cancer in 2013 that was estrogen and progesterone positive. It was in the upper outer quadrant of the left breast.  Her 2/23 mammogram was negative. She was treated with a left partial mastectomy followed by radiation that ended in 2013.  Her breasts are large and the right breast is larger than the left due to the left partial mastectomy.  She has hyperpigmentation of the inframammary area on both sides.  The sternal to nipple distance on the right is 34 cm and the left is 32 cm.  The IMF distance is 20 cm.  She is 5 feet 7 inches tall and weighs 203 pounds.  The BMI = 31.8 kg/m.  Preoperative bra size = 40-42 DDD cup.  Mammogram history: 2/23 and was negative. Tobacco use:  none.   The patient expresses the desire to pursue surgical intervention.  The patient had back surgery by Dr. Gladstone Lighter in 2012.  She also had a C-section.  She does have a keloid in the location of her port.  This is concerning for a repeat keloiding of any incisions.  The patient acknowledges that she does keloid.      Review of Systems   Constitutional: Negative.   HENT: Negative.    Eyes: Negative.   Respiratory: Negative.  Negative for chest tightness and shortness of breath.   Cardiovascular: Negative.   Gastrointestinal: Negative.   Endocrine: Negative.   Genitourinary: Negative.   Musculoskeletal:  Positive for back pain and neck pain.  Skin: Negative.   Hematological: Negative.     Past Medical History:  Diagnosis Date   Anemia    Back pain    Breast cancer ([place])    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,dcis   History of chemotherapy 03/23/12- 05/10/12   s/p 4 cycles of FEC    Hx: UTI (urinary tract infection)    Knee pain    Lactose intolerance    Neck pain    S/P radiation therapy 07/10/12-08/23/12   Left Breast: 50 Gy/25 Fractions; Boost: 10 Gy/5 Fractions   Status post chemotherapy 1 dose 05/23/12   Stopped after one cycle of Taxol due to Grade 2 Toxicity   Status post chemotherapy 1 dose 05/23/12   Stopped after one cycle of Taxol due to Grade 2 Toxicity    Past Surgical History:  Procedure Laterality Date   AXILLARY SURGERY  03/06/12   left, regional resection, 1/3 nodes pos   BACK SURGERY     LUMBAR DECOMPRESSION   BREAST BIOPSY  01/09/2012   left breast 3 0'clock/ER?PR =positive,her  2 neg   BREAST LUMPECTOMY Left 02/10/12   Left Breast: 2 Foci of Invasive Ductal Caand High grade Ductal Carcinoma In Situ, 0/14 nodes Left Axilla Negative : Regional Resection of Lymph Nodes: 0/5 Nodes Negative   FOOT SURGERY     multiple   INTRAUTERINE DEVICE REMOVAL  01/24/12   needle core Biospy  01/09/12   Left Breast: Invasive Ductal Carcinoma, Lymph Node Axilla: Metastatic Mammary Carcinoma   PORTACATH PLACEMENT  02/10/2012   Procedure: INSERTION PORT-A-CATH;  Surgeon: Rolm Bookbinder, MD;  Location: [facility];  Service: General;  Laterality: Right;   Regional Resection  03/06/12   Left Axilla: 1/3 Nodes Metastatic Carcinoma   SPINE SURGERY  01/2011      Current Outpatient  Medications:    b complex vitamins tablet, Take 1 tablet by mouth daily., Disp: , Rfl:    carboxymethylcellulose (REFRESH PLUS) 0.5 % SOLN, 1 drop 3 (three) times daily as needed., Disp: , Rfl:    celecoxib (CELEBREX) 200 MG capsule, Take 1 tablet by mouth daily., Disp: , Rfl:    cholecalciferol (VITAMIN D) 1000 UNITS tablet, Take 1,000 Units by mouth daily., Disp: , Rfl:    fluticasone (FLONASE) 50 MCG/ACT nasal spray, Place 2 sprays into both nostrils daily., Disp: 16 g, Rfl: 6   levocetirizine (XYZAL) 5 MG tablet, TAKE 1 TABLET BY MOUTH EVERY DAY IN THE EVENING, Disp: 90 tablet, Rfl: 1   levothyroxine (SYNTHROID) 50 MCG tablet, TAKE 1 TABLET BY MOUTH DAILY BEFORE BREAKFAST, Disp: 30 tablet, Rfl: 2   Objective:   There were no vitals filed for this visit.  Physical Exam Vitals and nursing note reviewed.  Constitutional:      Appearance: Normal appearance.  HENT:     Head: Normocephalic and atraumatic.  Cardiovascular:     Rate and Rhythm: Normal rate.     Pulses: Normal pulses.  Pulmonary:     Effort: Pulmonary effort is normal.  Abdominal:     Palpations: Abdomen is soft.  Musculoskeletal:        General: No swelling or deformity.  Skin:    General: Skin is warm.     Capillary Refill: Capillary refill takes less than 2 seconds.  Neurological:     Mental Status: She is alert and oriented to person, place, and time.  Psychiatric:        Mood and Affect: Mood normal.        Behavior: Behavior normal.        Thought Content: Thought content normal.     Assessment & Plan:  Other specified hypothyroidism  Other migraine without status migrainosus, not intractable  Depression, unspecified depression type  Primary cancer of upper outer quadrant of left female breast ([place])  The procedure the patient selected and that was best for the patient was discussed. The risk were discussed and include but not limited to the following:  Breast asymmetry, fluid accumulation, firmness  of the breast, inability to breast feed, loss of nipple or areola, skin loss, change in skin and nipple sensation, fat necrosis of the breast tissue, bleeding, infection and healing delay.  There are risks of anesthesia and injury to nerves or blood vessels.  Allergic reaction to tape, suture and skin glue are possible.  There will be swelling.  Any of these can lead to the need for revisional surgery.  A breast reduction has potential to interfere with diagnostic procedures in the future.  This procedure is best done when the breast  is fully developed.  Changes in the breast will continue to occur over time: pregnancy, weight gain or weigh loss.    Total time: 40 minutes. This includes time spent with the patient during the visit as well as time spent before and after the visit reviewing the chart, documenting the encounter, ordering pertinent studies and literature for the patient.    The biggest concern that the patient will have to deal with is keloiding of the right breast and wound healing issues on the left breast due to the radiation.  We talked about different options like only doing the right side to match the left side.  The patient knows that she keloids and that does concern her as well.  She is going to think about it and continue with a healthy weight and wellness for weight reduction.  She knows to contact us if she would like to discuss this further.  Pictures were obtained of the patient and placed in the chart with the patient's or guardian's permission.   [place], DO

## 2023-01-02 NOTE — Progress Notes (Signed)
Chief Complaint:   OBESITY Alisha Chen is here to discuss her progress with her obesity treatment plan along with follow-up of her obesity related diagnoses. Alisha Chen is on keeping a food journal and adhering to recommended goals of 1300 calories and 80 protein and states she is following her eating plan approximately 30-50% of the time. Alisha Chen states she is using her elliptical 30 minutes 3 times per week.  Today's visit was #: 43 Starting weight: 201 lbs Starting date: 12/10/2018 Today's weight: 204 lbs Today's date: 12/19/2022 Total lbs lost to date: 0 Total lbs lost since last in-office visit: +4 lbs  Interim History: Patient got down to 199 LBS but got off track.  She has been lacking adequate sleep.  Has a special needs son.  Lacking consistent exercise.  Doing intermittent fasting with an eating window 11 AM to 7 PM.  Eating out a lot.  She has been struggling with meal planning and consuming excess carbs.  Subjective:   1. Polyphagia Polyphagia likely due to excess and refined carbs but a lack of fiber and protein.  Patient has declined antiobesity medications in the past.  2. Vitamin D deficiency Inconsistently taking OTC vitamin D 1000 IU daily.  Patient has not had level checked since 2022.    Assessment/Plan:   1. Polyphagia Check fasting insulin, TSH next visit.  Increase water intake especially during fasting window.  Increase fiber activity with fruits and veggies.  2. Vitamin D deficiency Check vitamin D level next visit.  3. Obesity, current BMI 32.0 1.  Track intake, Alisha Chen, reduce to 1200 cal/day. 2.  History given on 16:8 intermittent fasting. 3.  IC and fasting labs next visit.  Alisha Chen is currently in the action stage of change. As such, her goal is to continue with weight loss efforts. She has agreed to keeping a food journal and adhering to recommended goals of 1200 calories and 80 protein.   Exercise goals:  30 minutes 4 times a week.  Behavioral  modification strategies: increasing lean protein intake, increasing vegetables, increasing water intake, decreasing liquid calories, decreasing eating out, no skipping meals, meal planning and cooking strategies, keeping healthy foods in the home, avoiding temptations, and planning for success.  Alisha Chen has agreed to follow-up with our clinic in 4 weeks. She was informed of the importance of frequent follow-up visits to maximize her success with intensive lifestyle modifications for her multiple health conditions.   Objective:   Blood pressure 112/75, pulse 78, temperature 98.1 F (36.7 C), height 5' 7"$  (1.702 m), weight 204 lb (92.5 kg), last menstrual period 02/27/2012, SpO2 99 %. Body mass index is 31.95 kg/m.  General: Cooperative, alert, well developed, in no acute distress. HEENT: Conjunctivae and lids unremarkable. Cardiovascular: Regular rhythm.  Lungs: Normal work of breathing. Neurologic: No focal deficits.   Lab Results  Component Value Date   CREATININE 0.82 07/14/2021   BUN 17 07/14/2021   NA 139 07/14/2021   K 5.0 07/14/2021   CL 104 07/14/2021   CO2 25 07/14/2021   Lab Results  Component Value Date   ALT 17 07/14/2021   AST 15 07/14/2021   ALKPHOS 112 07/14/2021   BILITOT 0.3 07/14/2021   Lab Results  Component Value Date   HGBA1C 4.8 07/14/2021   HGBA1C 5.0 11/10/2020   HGBA1C 5.0 05/27/2020   HGBA1C 4.8 10/21/2019   HGBA1C 4.9 12/10/2018   Lab Results  Component Value Date   INSULIN 12.6 07/14/2021   INSULIN 11.8 11/10/2020  INSULIN 13.5 05/27/2020   INSULIN 16.6 12/10/2018   Lab Results  Component Value Date   TSH 2.550 07/14/2021   Lab Results  Component Value Date   CHOL 204 (H) 07/14/2021   HDL 51 07/14/2021   LDLCALC 145 (H) 07/14/2021   TRIG 45 07/14/2021   CHOLHDL 4.0 10/21/2019   Lab Results  Component Value Date   VD25OH 57.5 07/14/2021   VD25OH 53.6 11/10/2020   VD25OH 54.1 05/27/2020   Lab Results  Component Value Date    WBC 4.3 05/27/2020   HGB 12.2 05/27/2020   HCT 38.8 05/27/2020   MCV 89 05/27/2020   PLT 227 05/27/2020   No results found for: "IRON", "TIBC", "FERRITIN"  Attestation Statements:   Reviewed by clinician on day of visit: allergies, medications, problem list, medical history, surgical history, family history, social history, and previous encounter notes.  I have personally spent 30 minutes total time today in preparation, patient care, nutritional counseling and documentation for this visit, including the following: review of clinical lab tests; review of medical tests/procedures/services.    I, Davy Pique, am acting as Location manager for Loyal Gambler, DO.  I have reviewed the above documentation for accuracy and completeness, and I agree with the above. Dell [place]onto, DO

## 2023-01-09 ENCOUNTER — Telehealth (INDEPENDENT_AMBULATORY_CARE_PROVIDER_SITE_OTHER): Payer: BC Managed Care – PPO | Admitting: Family Medicine

## 2023-01-09 ENCOUNTER — Ambulatory Visit
Admission: RE | Admit: 2023-01-09 | Discharge: 2023-01-09 | Disposition: A | Discharge status: Home / Self Care | Payer: BC Managed Care – PPO | Source: Ambulatory Visit | Attending: Adult Health | Admitting: Adult Health

## 2023-01-09 DIAGNOSIS — Z1231 Encounter for screening mammogram for malignant neoplasm of breast: Secondary | ICD-10-CM

## 2023-01-16 ENCOUNTER — Ambulatory Visit (INDEPENDENT_AMBULATORY_CARE_PROVIDER_SITE_OTHER): Payer: BC Managed Care – PPO | Admitting: Family Medicine

## 2023-01-16 ENCOUNTER — Encounter (INDEPENDENT_AMBULATORY_CARE_PROVIDER_SITE_OTHER): Payer: Self-pay | Admitting: Family Medicine

## 2023-01-16 VITALS — BP 123/83 | HR 69 | Temp 97.6°F | Ht 67.0 in | Wt 202.0 lb

## 2023-01-16 DIAGNOSIS — R0602 Shortness of breath: Secondary | ICD-10-CM | POA: Diagnosis not present

## 2023-01-16 DIAGNOSIS — E88819 Insulin resistance, unspecified: Secondary | ICD-10-CM | POA: Diagnosis not present

## 2023-01-16 DIAGNOSIS — Z6831 Body mass index (BMI) 31.0-31.9, adult: Secondary | ICD-10-CM

## 2023-01-16 DIAGNOSIS — E039 Hypothyroidism, unspecified: Secondary | ICD-10-CM

## 2023-01-16 DIAGNOSIS — E7849 Other hyperlipidemia: Secondary | ICD-10-CM

## 2023-01-16 DIAGNOSIS — Z6832 Body mass index (BMI) 32.0-32.9, adult: Secondary | ICD-10-CM

## 2023-01-16 DIAGNOSIS — E669 Obesity, unspecified: Secondary | ICD-10-CM | POA: Diagnosis not present

## 2023-01-16 DIAGNOSIS — E559 Vitamin D deficiency, unspecified: Secondary | ICD-10-CM

## 2023-01-16 NOTE — Assessment & Plan Note (Signed)
Indirect calorimetry repeated today compared to 2020 given lack of weight loss over 4 years of medically supervised weight management.  Her metabolism has dropped.  She was previously 1475 kcal/day and is now 1224 kcal/day resting metabolic rate  We discussed factors that contribute to drop in metabolic rate including lack of sleep, lack of adequate protein intake and lack of physical activity.  She has room for improvement with increasing her walking time, improving sleep time and tracking her protein intake.

## 2023-01-16 NOTE — Assessment & Plan Note (Signed)
Patient has mild insulin resistance and was last checked August 2022 at 12.6.  Recheck fasting insulin today and continue to reduce intake of added sugar and read labels on food and drink for sugar avoiding products with over 8 g of added sugar per serving.  She also has room for improvement with more consistent walking.  Reviewed her goals today.

## 2023-01-16 NOTE — Assessment & Plan Note (Signed)
She reports good compliance with use of levothyroxine 50 mcg daily on an empty stomach.  She does have complaint of fatigue and lack of weight loss.  Will recheck TSH today.

## 2023-01-16 NOTE — Assessment & Plan Note (Signed)
Reviewed her most recent lipid panel.  She is currently not on lipid-lowering medication.  She has been resistant to statin use.  Recheck lipid panel today and continue working on a low saturated/low trans fat diet.

## 2023-01-16 NOTE — Progress Notes (Signed)
Office: [phone]  /  Fax: [phone]  WEIGHT SUMMARY AND BIOMETRICS  Vitals Temp: 97.6 F (36.4 C) BP: 123/83 Pulse Rate: 69 SpO2: 100 %   Anthropometric Measurements Height: '5\' 7"'$  (1.702 m) Weight: 202 lb (91.6 kg) BMI (Calculated): 31.63 Weight at Last Visit: 204lb Weight Lost Since Last Visit: 2lb Starting Weight: 201lb Total Weight Loss (lbs): 2 lb (0.907 kg)   Body Composition  Body Fat %: 34.1 % Fat Mass (lbs): 69 lbs Muscle Mass (lbs): 126.4 lbs Total Body Water (lbs): 74.2 lbs Visceral Fat Rating : 9   Other Clinical Data Fasting: yes Labs: yes Today's Visit #: 71 Starting Date: 12/10/18     HPI  Chief Complaint: OBESITY  Alisha Chen is here to discuss her progress with her obesity treatment plan. She is on the keeping a food journal and adhering to recommended goals of 1200 calories and 80 protein and states she is following her eating plan approximately 40-50 % of the time. She states she is exercising 30 minutes 1 times per week.   Interval History:  Since last office visit she is down 2 lb.  She is up 1 lb in 4 years She is doing 16:8 IF 11am- 7p She is doing some stress eating later in the day, eating sweets Lack of time leads to no exercise and trouble with meal planning Has an elliptical at home and plans to walk more in the Spring  Pharmacotherapy: none  PHYSICAL EXAM:  Blood pressure 123/83, pulse 69, temperature 97.6 F (36.4 C), height '5\' 7"'$  (1.702 m), weight 202 lb (91.6 kg), last menstrual period 02/27/2012, SpO2 100 %. Body mass index is 31.64 kg/m.  General: She is overweight, cooperative, alert, well developed, and in no acute distress. PSYCH: Has normal mood, affect and thought process.   HEENT: EOMI, sclerae are anicteric. Lungs: Normal breathing effort, no conversational dyspnea. Extremities: No edema.  Neurologic: No gross sensory or motor deficits. No tremors or fasciculations noted.    DIAGNOSTIC DATA  REVIEWED:  BMET    Component Value Date/Time   NA 139 07/14/2021 1112   NA 140 12/25/2014 1349   K 5.0 07/14/2021 1112   K 4.4 12/25/2014 1349   CL 104 07/14/2021 1112   CL 105 12/21/2012 1527   CO2 25 07/14/2021 1112   CO2 27 12/25/2014 1349   GLUCOSE 78 07/14/2021 1112   GLUCOSE 72 12/25/2019 0854   GLUCOSE 71 12/25/2014 1349   GLUCOSE 75 12/21/2012 1527   BUN 17 07/14/2021 1112   BUN 22.5 12/25/2014 1349   CREATININE 0.82 07/14/2021 1112   CREATININE 0.9 12/25/2014 1349   CALCIUM 10.6 (H) 07/14/2021 1112   CALCIUM 10.2 12/25/2014 1349   GFRNONAA 83 11/10/2020 1007   GFRAA 96 11/10/2020 1007   Lab Results  Component Value Date   HGBA1C 4.8 07/14/2021   HGBA1C 4.9 12/10/2018   Lab Results  Component Value Date   INSULIN 12.6 07/14/2021   INSULIN 16.6 12/10/2018   Lab Results  Component Value Date   TSH 2.550 07/14/2021   CBC    Component Value Date/Time   WBC 4.3 05/27/2020 1351   WBC 5.1 12/25/2014 1348   WBC 4.0 08/14/2014 0933   RBC 4.34 05/27/2020 1351   RBC 4.03 12/25/2014 1348   RBC 4.32 08/14/2014 0933   HGB 12.2 05/27/2020 1351   HGB 11.7 12/25/2014 1348   HCT 38.8 05/27/2020 1351   HCT 35.9 12/25/2014 1348   PLT 227 05/27/2020 1351  MCV 89 05/27/2020 1351   MCV 89.1 12/25/2014 1348   MCH 28.1 05/27/2020 1351   MCH 29.0 12/25/2014 1348   MCH 27.1 02/09/2012 0900   MCHC 31.4 (L) 05/27/2020 1351   MCHC 32.6 12/25/2014 1348   MCHC 33.1 08/14/2014 0933   RDW 12.4 05/27/2020 1351   RDW 12.6 12/25/2014 1348   Iron Studies No results found for: "IRON", "TIBC", "FERRITIN", "IRONPCTSAT" Lipid Panel     Component Value Date/Time   CHOL 204 (H) 07/14/2021 1112   TRIG 45 07/14/2021 1112   HDL 51 07/14/2021 1112   CHOLHDL 4.0 10/21/2019 1610   CHOLHDL 3 08/14/2014 0933   VLDL 9.6 08/14/2014 0933   LDLCALC 145 (H) 07/14/2021 1112   Hepatic Function Panel     Component Value Date/Time   PROT 6.8 07/14/2021 1112   PROT 6.9 12/25/2014 1349    ALBUMIN 4.5 07/14/2021 1112   ALBUMIN 3.7 12/25/2014 1349   AST 15 07/14/2021 1112   AST 19 12/25/2014 1349   ALT 17 07/14/2021 1112   ALT 19 12/25/2014 1349   ALKPHOS 112 07/14/2021 1112   ALKPHOS 116 12/25/2014 1349   BILITOT 0.3 07/14/2021 1112   BILITOT 0.28 12/25/2014 1349   BILIDIR 0.1 08/14/2014 0933      Component Value Date/Time   TSH 2.550 07/14/2021 1112   Nutritional Lab Results  Component Value Date   VD25OH 57.5 07/14/2021   VD25OH 53.6 11/10/2020   VD25OH 54.1 05/27/2020     ASSESSMENT AND PLAN  TREATMENT PLAN FOR OBESITY:  Recommended Dietary Goals  Mariann is currently in the action stage of change. As such, her goal is to continue weight management plan. She has agreed to keeping a food journal and adhering to recommended goals of 1000-12000 calories and 80-90 g of protein.  Behavioral Intervention  We discussed the following Behavioral Modification Strategies today: increasing lean protein intake, increasing vegetables, increasing water intake, work on meal planning and easy cooking plans, work on Interior and spatial designer calories using tracking App, work on managing stress, creating time for self-care and relaxation measures, and ways to avoid night time snacking.  Additional resources provided today: NA  Recommended Physical Activity Goals  Donnamae has been advised to work up to 150 minutes of moderate intensity aerobic activity a week and strengthening exercises 2-3 times per week for cardiovascular health, weight loss maintenance and preservation of muscle mass.   She has agreed to Patient also encouraged on scheduling and tracking physical activity.  Recommend getting a smart watch and setting a step goal of 10,000/day  Pharmacotherapy We discussed various medication options to help Aleiah with her weight loss efforts and we both agreed to none.  ASSOCIATED CONDITIONS ADDRESSED TODAY  SOBOE (shortness of breath on exertion) Assessment &  Plan: Indirect calorimetry repeated today compared to 2020 given lack of weight loss over 4 years of medically supervised weight management.  Her metabolism has dropped.  She was previously 1475 kcal/day and is now 1224 kcal/day resting metabolic rate  We discussed factors that contribute to drop in metabolic rate including lack of sleep, lack of adequate protein intake and lack of physical activity.  She has room for improvement with increasing her walking time, improving sleep time and tracking her protein intake.   Obesity,current BMI 31.7  Vitamin D deficiency Assessment & Plan: She has been off of vitamin D supplement but complains of fatigue.  Will recheck vitamin D level today.  Target vitamin D level is 50-70.  Last vitamin D Lab Results  Component Value Date   VD25OH 57.5 07/14/2021     Orders: -     VITAMIN D 25 Hydroxy (Vit-D Deficiency, Fractures)  Hypothyroidism, unspecified type Assessment & Plan: She reports good compliance with use of levothyroxine 50 mcg daily on an empty stomach.  She does have complaint of fatigue and lack of weight loss.  Will recheck TSH today.  Orders: -     TSH Rfx on Abnormal to Free T4  Insulin resistance Assessment & Plan: Patient has mild insulin resistance and was last checked August 2022 at 12.6.  Recheck fasting insulin today and continue to reduce intake of added sugar and read labels on food and drink for sugar avoiding products with over 8 g of added sugar per serving.  She also has room for improvement with more consistent walking.  Reviewed her goals today.  Orders: -     Insulin, random -     Hemoglobin A1c -     Comprehensive metabolic panel  Other hyperlipidemia Assessment & Plan: Reviewed her most recent lipid panel.  She is currently not on lipid-lowering medication.  She has been resistant to statin use.  Recheck lipid panel today and continue working on a low saturated/low trans fat diet.  Orders: -     Lipid  panel  Generalized obesity      No follow-ups on file.Marland Kitchen She was informed of the importance of frequent follow up visits to maximize her success with intensive lifestyle modifications for her multiple health conditions.   ATTESTASTION STATEMENTS:  Reviewed by clinician on day of visit: allergies, medications, problem list, medical history, surgical history, family history, social history, and previous encounter notes.   I have personally spent 30 minutes total time today in preparation, patient care, nutritional counseling and documentation for this visit, including the following: review of clinical lab tests; review of medical tests/procedures/services.      Dell Ponto, DO

## 2023-01-16 NOTE — Assessment & Plan Note (Signed)
She has been off of vitamin D supplement but complains of fatigue.  Will recheck vitamin D level today.  Target vitamin D level is 50-70.  Last vitamin D Lab Results  Component Value Date   VD25OH 57.5 07/14/2021

## 2023-01-18 NOTE — Progress Notes (Unsigned)
TeleHealth Visit:  This visit was completed with telemedicine (audio/video) technology. Alisha Chen has verbally consented to this TeleHealth visit. The patient is located at home, the provider is located at home. The participants in this visit include the listed provider and patient. The visit was conducted today via MyChart video.  OBESITY Alisha Chen is here to discuss her progress with her obesity treatment plan along with follow-up of her obesity related diagnoses.   Today's visit was # 62 Starting weight: 201 lbs Starting date: 12/10/18 Weight at last in office visit: 202 lbs on 01/16/23 Total weight loss: 0 lbs at last in office visit on 01/16/23. Today's reported weight: No weight reported.  Nutrition Plan: keeping a food journal with goal of 1000-1200 calories and 80-90 grams of protein daily.  Current exercise: very little exercise- once weekly elliptical for 30 minutes.   Interim History:  Alisha Chen is really struggling with keeping calories between 1000-1200.  She is trying to do intermittent fasting, eating between 11 AM and 7 PM.  However she often cannot get in her last 200 to 300 cal before 7 PM which leads to overeating when she gets home.  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  When she misses her 7 PM deadline this causes her to overeat and exceed calories up to 1500/day.  She is consistently meeting her protein goal of 80 g/day.  Her ideal time to eat her last meal is between 4 PM and 5 PM.  She has been with our clinic for 4 years without any weight loss.  She fluctuates up or down 2 to 3 pounds.  Exercise has been sporadic-only about once weekly.  She is considering getting a step tracker as suggested by Dr. Valetta Close.  Assessment/Plan:  1. Eating disorder/emotional eating Alisha Chen has had issues with stress/emotional eating.  This happens after 7 PM at night.  She saw Dr. Francee Piccolo for several visits in 2023.  It has been a consistent struggle with her since  starting in our clinic.  She declines medication. Currently this is poorly controlled. Overall mood is stable.  Plan: Discussed strategies for reducing eating after 7 PM.  Suggested it might be a good time to use the elliptical.  2. Generalized Obesity: Current BMI 31  Alisha Chen is currently in the action stage of change. As such, her goal is to continue with weight loss efforts.  She has agreed to keeping a food journal with goal of 1000-1200 calories and 80-90 grams of protein daily.  1.  Encouraged her to pack food rather than eating out. 2.  Encouraged her to reconsider her window for intermittent fasting to start earlier in the day.  She feels this would be worse for adherence.  Exercise goals: Encouraged her to increase frequency of elliptical.  Consider getting a step tracker with a goal of 10,000 steps per day.  Behavioral modification strategies: increasing lean protein intake, decreasing simple carbohydrates , meal planning , and emotional eating strategies.  Alisha Chen has agreed to follow-up with our clinic in 4 weeks.   No orders of the defined types were placed in this encounter.   There are no discontinued medications.   No orders of the defined types were placed in this encounter.     Objective:   VITALS: Per patient if applicable, see vitals. GENERAL: Alert and in no acute distress. CARDIOPULMONARY: No increased WOB. Speaking in clear sentences.  PSYCH: Pleasant and cooperative. Speech normal rate and rhythm. Affect is appropriate. Insight  and judgement are appropriate. Attention is focused, linear, and appropriate.  NEURO: Oriented as arrived to appointment on time with no prompting.   Attestation Statements:   Reviewed by clinician on day of visit: allergies, medications, problem list, medical history, surgical history, family history, social history, and previous encounter notes.  Time spent on visit including the items listed below was 34 minutes.  -preparing to  see the patient (e.g., review of tests, history, previous notes) -obtaining and/or reviewing separately obtained history -counseling and educating the patient/family/caregiver -documenting clinical information in the electronic or other health record -ordering medications, tests, or procedures -independently interpreting results and communicating results to the patient/ family/caregiver -referring and communicating with other health care professionals  -care coordination   This was prepared with the assistance of Presenter, broadcasting.  Occasional wrong-word or sound-a-like substitutions may have occurred due to the inherent limitations of voice recognition software.

## 2023-01-18 NOTE — Telephone Encounter (Signed)
Opened in error

## 2023-01-19 ENCOUNTER — Encounter (INDEPENDENT_AMBULATORY_CARE_PROVIDER_SITE_OTHER): Payer: Self-pay | Admitting: Family Medicine

## 2023-01-19 ENCOUNTER — Telehealth (INDEPENDENT_AMBULATORY_CARE_PROVIDER_SITE_OTHER): Payer: BC Managed Care – PPO | Admitting: Family Medicine

## 2023-01-19 DIAGNOSIS — Z6831 Body mass index (BMI) 31.0-31.9, adult: Secondary | ICD-10-CM | POA: Diagnosis not present

## 2023-01-19 DIAGNOSIS — F509 Eating disorder, unspecified: Secondary | ICD-10-CM | POA: Diagnosis not present

## 2023-01-19 DIAGNOSIS — E669 Obesity, unspecified: Secondary | ICD-10-CM | POA: Diagnosis not present

## 2023-02-15 ENCOUNTER — Other Ambulatory Visit: Payer: Self-pay | Admitting: Family Medicine

## 2023-02-15 DIAGNOSIS — H6523 Chronic serous otitis media, bilateral: Secondary | ICD-10-CM

## 2023-02-23 ENCOUNTER — Ambulatory Visit (INDEPENDENT_AMBULATORY_CARE_PROVIDER_SITE_OTHER): Payer: BC Managed Care – PPO | Admitting: Family Medicine

## 2023-02-23 ENCOUNTER — Encounter (INDEPENDENT_AMBULATORY_CARE_PROVIDER_SITE_OTHER): Payer: Self-pay | Admitting: Family Medicine

## 2023-02-23 VITALS — BP 103/70 | HR 66 | Temp 97.7°F | Ht 67.0 in | Wt 201.0 lb

## 2023-02-23 DIAGNOSIS — Z6831 Body mass index (BMI) 31.0-31.9, adult: Secondary | ICD-10-CM | POA: Diagnosis not present

## 2023-02-23 DIAGNOSIS — E559 Vitamin D deficiency, unspecified: Secondary | ICD-10-CM | POA: Diagnosis not present

## 2023-02-23 DIAGNOSIS — E669 Obesity, unspecified: Secondary | ICD-10-CM

## 2023-02-23 DIAGNOSIS — F509 Eating disorder, unspecified: Secondary | ICD-10-CM | POA: Insufficient documentation

## 2023-02-23 NOTE — Progress Notes (Signed)
Office: [phone]  /  Fax: [place]  Starting Date: 12/10/18  Starting Weight: 201lb   Weight Lost Since Last Visit: 1lb   Vitals Temp: 97.7 F (36.5 C) BP: 103/70 Pulse Rate: 66 SpO2: 98 %   Body Composition  Body Fat %: 33.7 % Fat Mass (lbs): 67.8 lbs Muscle Mass (lbs): 126.8 lbs Total Body Water (lbs): 75.6 lbs Visceral Fat Rating : 9    HPI  Chief Complaint: OBESITY  Alisha Chen is here to discuss her progress with her obesity treatment plan. She is on the keeping a food journal and adhering to recommended goals of 1200 calories and 80 protein and states she is following her eating plan approximately 50 % of the time. She states she is exercising 30 or more minutes 1 times per week.   Interval History:  Since last office visit she is down 1 lb She has a net weight loss of 0 lb in 4 years She started 12:8 IF, eating from noon - 8 pm She denies feeling super hungry at noon She is not drinking enough water in the morning She does work as a Pharmacist, hospital She eats out for lunch daily but recently started watching calories She tends to eat out on the weekends too She is rarely cooking at home. She is only having a [employer] shake for dinner some days  Pharmacotherapy: none  PHYSICAL EXAM:  Blood pressure 103/70, pulse 66, temperature 97.7 F (36.5 C), height 5\' 7"  (1.702 m), weight 201 lb (91.2 kg), last menstrual period 02/27/2012, SpO2 98 %. Body mass index is 31.48 kg/m.  General: She is overweight, cooperative, alert, well developed, and in no acute distress. PSYCH: Has normal mood, affect and thought process.   Lungs: Normal breathing effort, no conversational dyspnea.   ASSESSMENT AND PLAN  TREATMENT PLAN FOR OBESITY:  Recommended Dietary Goals  Alisha Chen is currently in the action stage of change. As such, her goal is to continue weight management plan. She has agreed to keeping a food journal and adhering to recommended  goals of 1200 calories and 80+ g of  protein.  Behavioral Intervention  We discussed the following Behavioral Modification Strategies today: increasing lean protein intake, increasing water intake, work on meal planning and preparation, work on tracking and journaling calories using tracking application, and decreasing eating out or consumption of processed foods, and making healthy choices when eating convenient foods.  Additional resources provided today: NA  Recommended Physical Activity Goals  Alisha Chen has been advised to work up to 150 minutes of moderate intensity aerobic activity a week and strengthening exercises 2-3 times per week for cardiovascular health, weight loss maintenance and preservation of muscle mass.   She has agreed to Work on scheduling and tracking physical activity.  Recommend tracking steps, using ellipitcal on days that she is not working 2 jobs  Pharmacotherapy changes for the treatment of obesity:  none  ASSOCIATED CONDITIONS ADDRESSED TODAY  Eating disorder/emotional eating Assessment & Plan: High stress and working 2 jobs have been a barrier to her weight loss.  She feels more in control of nighttime snacking using 12:8 Intermittent fasting. She has declined use of medication and has previously seen Dr Mallie Mussel for CBT.  We discussed the importance of proper sleep at night, good nutrition and stress reduction.  She may continue 12 8 intermittent fasting but encouraged her to log her daily caloric intake making sure she is getting in her target for her to total daily  calories and protein intake.   Generalized obesity: Starting BMI 32.4 Assessment & Plan: Patient has failed to see weight loss and 4 years of medically supervised weight management.  Working 2 jobs and high stress levels have been a barrier to her progress.  She is looking at retiring in the next 2 years and plans to increase exercise over the summer months.  She was not hydrated well enough to  perform labs today.  Will update these in June.  Keep her upcoming video visit with [name], NP in May.  Encouraged to tracking using the my fitness pal app and a step counter.  If patient is unable to obtain weight loss by end of summer 2024, will delay her treatment until she is retired from [employer].   BMI 31.0-31.9,adult  Vitamin D deficiency Assessment & Plan: Last vitamin D Lab Results  Component Value Date   VD25OH 57.5 07/14/2021   Patient has been taking over-the-counter vitamin D supplement.  A vitamin D level has been added to her upcoming labs.  She does report high levels of fatigue.       She was informed of the importance of frequent follow up visits to maximize her success with intensive lifestyle modifications for her multiple health conditions.   ATTESTASTION STATEMENTS:  Reviewed by clinician on day of visit: allergies, medications, problem list, medical history, surgical history, family history, social history, and previous encounter notes pertinent to obesity diagnosis.   I have personally spent 30 minutes total time today in preparation, patient care, nutritional counseling and documentation for this visit, including the following: review of clinical lab tests; review of medical tests/procedures/services.      Dell Ponto, DO DABFM, DABOM [facility] [address] [place], [place] [address] [phone]

## 2023-02-23 NOTE — Assessment & Plan Note (Signed)
High stress and working 2 jobs have been a barrier to her weight loss.  She feels more in control of nighttime snacking using 12:8 Intermittent fasting. She has declined use of medication and has previously seen Dr Mallie Mussel for CBT.  We discussed the importance of proper sleep at night, good nutrition and stress reduction.  She may continue 12 8 intermittent fasting but encouraged her to log her daily caloric intake making sure she is getting in her target for her to total daily calories and protein intake.

## 2023-02-23 NOTE — Assessment & Plan Note (Signed)
Patient has failed to see weight loss and 4 years of medically supervised weight management.  Working 2 jobs and high stress levels have been a barrier to her progress.  She is looking at retiring in the next 2 years and plans to increase exercise over the summer months.  She was not hydrated well enough to perform labs today.  Will update these in June.  Keep her upcoming video visit with [name], NP in May.  Encouraged to tracking using the my fitness pal app and a step counter.  If patient is unable to obtain weight loss by end of summer 2024, will delay her treatment until she is retired from [employer].

## 2023-02-23 NOTE — Assessment & Plan Note (Signed)
Last vitamin D Lab Results  Component Value Date   VD25OH 57.5 07/14/2021   Patient has been taking over-the-counter vitamin D supplement.  A vitamin D level has been added to her upcoming labs.  She does report high levels of fatigue.

## 2023-03-23 ENCOUNTER — Telehealth (INDEPENDENT_AMBULATORY_CARE_PROVIDER_SITE_OTHER): Payer: BC Managed Care – PPO | Admitting: Family Medicine

## 2023-03-24 ENCOUNTER — Institutional Professional Consult (permissible substitution): Payer: BC Managed Care – PPO | Admitting: Plastic Surgery

## 2023-03-29 ENCOUNTER — Other Ambulatory Visit: Payer: Self-pay | Admitting: Family Medicine

## 2023-03-29 NOTE — Progress Notes (Signed)
  TeleHealth Visit:  This visit was completed with telemedicine (audio/video) technology. Alisha Chen has verbally consented to this TeleHealth visit. The patient is located at home, the provider is located at home. The participants in this visit include the listed provider and patient. The visit was conducted today via MyChart video.  OBESITY Alisha Chen is here to discuss her progress with her obesity treatment plan along with follow-up of her obesity related diagnoses.   Today's visit was # 69 Starting weight: 201 lbs Starting date: 12/10/18 Weight at last in office visit: 201 lbs on 02/23/23 Total weight loss: 0 lbs at last in office visit on 02/23/23. Today's reported weight (***): {dwwweightreported:29243}  Nutrition Plan: keeping a food journal with goal of 1200 calories and 80+ grams of protein daily - ***% adherence.  Current exercise: {exercise types:16438}  Interim History:  ***  Eating all of the prescribed protein: {yes***/no:17258} Skipping meals: {dwwyes:29172} Drinking adequate water: {dwwyes:29172} Drinking sugar sweetened beverages: {dwwyes:29172} Hunger controlled: {EWCONTROLASSESSMENT:24261}. Cravings controlled:  {EWCONTROLASSESSMENT:24261}.  Journaling Consistently:  {dwwyes:29172} Meeting protein goals:  {dwwyes:29172} Meeting calorie goals:  {dwwyes:29172}   Pharmacotherapy: Alisha Chen is on {dwwpharmacotherapy:29109} Adverse side effects: {dwwse:29122} Hunger is {EWCONTROLASSESSMENT:24261}.  Cravings are {EWCONTROLASSESSMENT:24261}.  Assessment/Plan:  1. ***  2. ***  3. ***  {dwwmorbid:29108::"Morbid Obesity"}: Current BMI ***  Pharmacotherapy Plan {dwwmed:29123}  {dwwpharmacotherapy:29109}  Alisha Chen {CHL AMB IS/IS NOT:210130109} currently in the action stage of change. As such, her goal is to {MWMwtloss#1:210800005}.  She has agreed to {dwwsldiets:29085}.  Exercise goals: {MWM EXERCISE RECS:23473}  Behavioral modification strategies:  {dwwslwtlossstrategies:29088}.  Alisha Chen has agreed to follow-up with our clinic in {NUMBER 1-10:22536} weeks.   No orders of the defined types were placed in this encounter.   There are no discontinued medications.   No orders of the defined types were placed in this encounter.     Objective:   VITALS: Per patient if applicable, see vitals. GENERAL: Alert and in no acute distress. CARDIOPULMONARY: No increased WOB. Speaking in clear sentences.  PSYCH: Pleasant and cooperative. Speech normal rate and rhythm. Affect is appropriate. Insight and judgement are appropriate. Attention is focused, linear, and appropriate.  NEURO: Oriented as arrived to appointment on time with no prompting.   Attestation Statements:   Reviewed by clinician on day of visit: allergies, medications, problem list, medical history, surgical history, family history, social history, and previous encounter notes.  ***(delete if time-based billing not used) Time spent on visit including the items listed below was *** minutes.  -preparing to see the patient (e.g., review of tests, history, previous notes) -obtaining and/or reviewing separately obtained history -counseling and educating the patient/family/caregiver -documenting clinical information in the electronic or other health record -ordering medications, tests, or procedures -independently interpreting results and communicating results to the patient/ family/caregiver -referring and communicating with other health care professionals  -care coordination   This was prepared with the assistance of Engineer, civil (consulting).  Occasional wrong-word or sound-a-like substitutions may have occurred due to the inherent limitations of voice recognition software.

## 2023-03-30 ENCOUNTER — Telehealth (INDEPENDENT_AMBULATORY_CARE_PROVIDER_SITE_OTHER): Payer: Self-pay | Admitting: Family Medicine

## 2023-03-30 ENCOUNTER — Encounter (INDEPENDENT_AMBULATORY_CARE_PROVIDER_SITE_OTHER): Payer: BC Managed Care – PPO | Admitting: Family Medicine

## 2023-03-30 DIAGNOSIS — Z6832 Body mass index (BMI) 32.0-32.9, adult: Secondary | ICD-10-CM

## 2023-03-30 DIAGNOSIS — Z6831 Body mass index (BMI) 31.0-31.9, adult: Secondary | ICD-10-CM

## 2023-03-30 DIAGNOSIS — E669 Obesity, unspecified: Secondary | ICD-10-CM

## 2023-03-30 NOTE — Telephone Encounter (Signed)
Patient was a no-show for virtual visit.  Waited on the virtual visit platform until 8 minutes past appointment time.  Called and left VM advising patient to call to reschedule.

## 2023-03-30 NOTE — Progress Notes (Signed)
TeleHealth Visit:  This visit was completed with telemedicine (audio/video) technology. Alisha Chen has verbally consented to this TeleHealth visit. The patient is located at home, the provider is located at home. The participants in this visit include the listed provider and patient. The visit was conducted today via MyChart video.  OBESITY Alisha Chen is here to discuss her progress with her obesity treatment plan along with follow-up of her obesity related diagnoses.   Today's visit was # 71 Starting weight: 201 lbs Starting date: 12/10/18 Weight at last in office visit: 201 lbs on 02/23/23 Total weight loss: 0 lbs at last in office visit on 02/23/23. Today's reported weight (04/03/23):  204 lbs  Nutrition Plan: keeping a food journal with goal of 1200 calories and 80 grams of protein daily   Current exercise:   elliptical once weekly for 30-60 minutes.    Interim History:  She eating between 11am and 7 pm (intermittent fasting). Denies hunger before 11am but has hunger after 7 pm sometimes.  She is eating out lunch at 11am 5 days/week. Alisha Chen, Alisha Chen, Alisha Chen). She is is tracking intake and keeping calories less than 1200 3-4 days per week. She hasn't lost weight through IF but she feels that it keeps her from overeating.  She is meeting her protein goal.  Struggles with consistency with exercise and meals. Last day of school for her is June 10. 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  Continues to struggle with consistency and eats out lunch daily. Currently this is poorly controlled. Overall mood is stable. Medication(s): none  Plan: Encouraged her to take more time for herself, especially this summer when she is done with her school year. Encouraged good self-care-eating at home, resting.   2. Generalized Obesity: Current BMI 32  [name] is currently in the action stage of  change. As such, her goal is to continue with weight loss efforts.  She has agreed to keeping a food journal with goal of 1200 calories and 80 grams of protein daily.  1.  Keep calories less than 1200/day. 2.  Eat lunch at home rather than eating out once school lets out.  Exercise goals: Increase frequency of exercise.  After school lets out, increase cardio to 5 days/week and add resistance training with hand weights 3 to 5 days/week.  Behavioral modification strategies: increasing lean protein intake, decreasing simple carbohydrates , decrease eating out, meal planning , and planning for success.  [name] has agreed to follow-up with our clinic in 4 weeks with fasting labs.   No orders of the defined types were placed in this encounter.   There are no discontinued medications.   No orders of the defined types were placed in this encounter.     Objective:   VITALS: Per patient if applicable, see vitals. GENERAL: Alert and in no acute distress. CARDIOPULMONARY: No increased WOB. Speaking in clear sentences.  PSYCH: Pleasant and cooperative. Speech normal rate and rhythm. Affect is appropriate. Insight and judgement are appropriate. Attention is focused, linear, and appropriate.  NEURO: Oriented as arrived to appointment on time with no prompting.   Attestation Statements:   Reviewed by clinician on day of visit: allergies, medications, problem list, medical history, surgical history, family history, social history, and previous encounter notes.   This was prepared with the assistance of Engineer, civil (consulting).  Occasional wrong-word or sound-a-like substitutions may have occurred due to the inherent limitations  of voice recognition software.

## 2023-04-03 ENCOUNTER — Telehealth (INDEPENDENT_AMBULATORY_CARE_PROVIDER_SITE_OTHER): Payer: BC Managed Care – PPO | Admitting: Family Medicine

## 2023-04-03 ENCOUNTER — Encounter (INDEPENDENT_AMBULATORY_CARE_PROVIDER_SITE_OTHER): Payer: Self-pay | Admitting: Family Medicine

## 2023-04-03 DIAGNOSIS — F509 Eating disorder, unspecified: Secondary | ICD-10-CM

## 2023-04-03 DIAGNOSIS — Z6832 Body mass index (BMI) 32.0-32.9, adult: Secondary | ICD-10-CM | POA: Diagnosis not present

## 2023-04-03 DIAGNOSIS — E669 Obesity, unspecified: Secondary | ICD-10-CM | POA: Diagnosis not present

## 2023-05-04 ENCOUNTER — Encounter (INDEPENDENT_AMBULATORY_CARE_PROVIDER_SITE_OTHER): Payer: Self-pay | Admitting: Family Medicine

## 2023-05-04 ENCOUNTER — Ambulatory Visit (INDEPENDENT_AMBULATORY_CARE_PROVIDER_SITE_OTHER): Payer: BC Managed Care – PPO | Admitting: Family Medicine

## 2023-05-04 VITALS — BP 105/72 | HR 66 | Temp 98.3°F | Ht 66.0 in | Wt 204.0 lb

## 2023-05-04 DIAGNOSIS — E7849 Other hyperlipidemia: Secondary | ICD-10-CM

## 2023-05-04 DIAGNOSIS — Z6832 Body mass index (BMI) 32.0-32.9, adult: Secondary | ICD-10-CM

## 2023-05-04 DIAGNOSIS — F509 Eating disorder, unspecified: Secondary | ICD-10-CM

## 2023-05-04 DIAGNOSIS — E6609 Other obesity due to excess calories: Secondary | ICD-10-CM

## 2023-05-04 DIAGNOSIS — E66811 Obesity, class 1: Secondary | ICD-10-CM | POA: Insufficient documentation

## 2023-05-04 NOTE — Progress Notes (Signed)
Office: [phone]  /  Fax: [phone]  WEIGHT SUMMARY AND BIOMETRICS  Starting Date: 12/10/18  Starting Weight: 201 lb   Weight Lost Since Last Visit: 0   Vitals Temp: 98.3 F (36.8 C) BP: 105/72 Pulse Rate: 66 SpO2: 98 %   Body Composition  Body Fat %: 38 % Fat Mass (lbs): 77.8 lbs Muscle Mass (lbs): 120.4 lbs Total Body Water (lbs): 77.2 lbs Visceral Fat Rating : 10     HPI  Chief Complaint: OBESITY  Alisha Chen is here to discuss her progress with her obesity treatment plan. She is on the keeping a food journal and adhering to recommended goals of 1200 calories and 80 protein and states she is following her eating plan approximately 50 % of the time. She states she is exercising walking [place] in the park, 5.5 miles 3 times per week.   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 Body mass index is 32.93 kg/m.  General: She is overweight, cooperative, alert, well developed, and in no acute distress. PSYCH: Has normal mood, affect and thought process.   Lungs: Normal breathing effort, no conversational dyspnea.   ASSESSMENT AND PLAN  TREATMENT PLAN FOR OBESITY:  Recommended Dietary Goals  Alisha Chen is currently in the action stage of change. As such, her goal is to continue weight management plan. She has agreed to keeping a food journal and  adhering to recommended goals of 1200 calories and 90 g or more of protein.  Behavioral Intervention  We discussed the following Behavioral Modification Strategies today: increasing lean protein intake, decreasing simple carbohydrates , increasing vegetables, increasing lower glycemic fruits, increasing water intake, keeping healthy foods at home, work on managing stress, creating time for self-care and relaxation measures, continue to practice mindfulness when eating, and planning for success.  Additional resources provided today: NA  Recommended Physical Activity Goals  Alisha Chen has been advised to work up to 150 minutes of moderate intensity aerobic activity a week and strengthening exercises 2-3 times per week for cardiovascular health, weight loss maintenance and preservation of muscle mass.   She has agreed to Increase the intensity, frequency or duration of aerobic exercises    Pharmacotherapy changes for the treatment of obesity: None  ASSOCIATED CONDITIONS ADDRESSED TODAY  Eating disorder/emotional eating Assessment & Plan: Patient has a history of disordered eating due to emotional stressors.  She continues to work 2 jobs with high stress levels at home.  This has been a barrier to her progress over the past few years.  She is struggling to make healthy food choices, plan out meals and eat on the schedule.  She has struggled to log her daily caloric intake.  She has recently increased her walking time now that her schedule allows.  Continue cognitive behavioral therapy and actively  work on stress reduction.  Work on eating on a schedule getting lean protein and fiber with meals.   Class 1 obesity due to excess calories without serious comorbidity with body mass index (BMI) of 32.0 to 32.9 in adult Assessment & Plan: Reviewed patient's overall progress.  Reviewed bioimpedance results.  Patient has a net weight gain of 3 pounds in the past 4+ years of medically supervised weight  management.  Working 2 jobs, high stress levels and failure to log her daily caloric intake with lack of adequate exercise has been part of her struggle to see further results.  Labs ordered today but will failed to complete due to inadequate water intake.  Patient has been doing telemedicine visits over the past few months.  She did not come in early for IC today.  She previously had GI side effects on Saxenda.  She lacks insurance coverage for antiobesity medication currently.  We discussed realistic goals given her overall progress over the past 4+ years of medically supervised weight management.  She is planning to retire in about 2 years and may see better success once not working 2 jobs.  She will take focus on logging daily intake with a target goal 1200 cal/day which should include 90 g of protein or more daily, walking 5 miles 3 days a week and stress reduction over the summer.  Consider use of Wegovy or tirzepatide if covered in 2025.  She may continue video visits with Dawn.  She will need to come back in office for IC and fasting labs in the next 3 months.   Other hyperlipidemia Assessment & Plan: Lab Results  Component Value Date   CHOL 204 (H) 07/14/2021   HDL 51 07/14/2021   LDLCALC 145 (H) 07/14/2021   TRIG 45 07/14/2021   CHOLHDL 4.0 10/21/2019   Reviewed her lipid panel from 2022.  Her LDL and total cholesterol elevated.  She has never used lipid-lowering medication.  She has been working on dietary changes but has not seen weight reduction in the past 4 years of medically supervised weight management.  She does report a positive family history for hyperlipidemia.  Fasting labs were ordered today but not drawn due to her dehydration.  Patient will need her fasting labs done in the next 3 months.       She was informed of the importance of frequent follow up visits to maximize her success with intensive lifestyle modifications for her multiple health  conditions.   ATTESTASTION STATEMENTS:  Reviewed by clinician on day of visit: allergies, medications, problem list, medical history, surgical history, family history, social history, and previous encounter notes pertinent to obesity diagnosis.   I have personally spent 30 minutes total time today in preparation, patient care, nutritional counseling and documentation for this visit, including the following: review of clinical lab tests; review of medical tests/procedures/services.      Glennis Brink, DO DABFM, DABOM [facility] [address] [place], [place] [address] [phone]

## 2023-05-04 NOTE — Assessment & Plan Note (Signed)
Lab Results  Component Value Date   CHOL 204 (H) 07/14/2021   HDL 51 07/14/2021   LDLCALC 145 (H) 07/14/2021   TRIG 45 07/14/2021   CHOLHDL 4.0 10/21/2019   Reviewed her lipid panel from 2022.  Her LDL and total cholesterol elevated.  She has never used lipid-lowering medication.  She has been working on dietary changes but has not seen weight reduction in the past 4 years of medically supervised weight management.  She does report a positive family history for hyperlipidemia.  Fasting labs were ordered today but not drawn due to her dehydration.  Patient will need her fasting labs done in the next 3 months.

## 2023-05-04 NOTE — Assessment & Plan Note (Signed)
Reviewed patient's overall progress.  Reviewed bioimpedance results.  Patient has a net weight gain of 3 pounds in the past 4+ years of medically supervised weight management.  Working 2 jobs, high stress levels and failure to log her daily caloric intake with lack of adequate exercise has been part of her struggle to see further results.  Labs ordered today but will failed to complete due to inadequate water intake.  Patient has been doing telemedicine visits over the past few months.  She did not come in early for IC today.  She previously had GI side effects on Saxenda.  She lacks insurance coverage for antiobesity medication currently.  We discussed realistic goals given her overall progress over the past 4+ years of medically supervised weight management.  She is planning to retire in about 2 years and may see better success once not working 2 jobs.  She will take focus on logging daily intake with a target goal 1200 cal/day which should include 90 g of protein or more daily, walking 5 miles 3 days a week and stress reduction over the summer.  Consider use of Wegovy or tirzepatide if covered in 2025.  She may continue video visits with Dawn.  She will need to come back in office for IC and fasting labs in the next 3 months.

## 2023-05-04 NOTE — Assessment & Plan Note (Signed)
Patient has a history of disordered eating due to emotional stressors.  She continues to work 2 jobs with high stress levels at home.  This has been a barrier to her progress over the past few years.  She is struggling to make healthy food choices, plan out meals and eat on the schedule.  She has struggled to log her daily caloric intake.  She has recently increased her walking time now that her schedule allows.  Continue cognitive behavioral therapy and actively work on stress reduction.  Work on eating on a schedule getting lean protein and fiber with meals.

## 2023-05-27 ENCOUNTER — Other Ambulatory Visit: Payer: Self-pay | Admitting: Family Medicine

## 2023-05-27 DIAGNOSIS — H6523 Chronic serous otitis media, bilateral: Secondary | ICD-10-CM

## 2023-05-31 NOTE — Progress Notes (Signed)
  TeleHealth Visit:  This visit was completed with telemedicine (audio/video) technology. Kalecia has verbally consented to this TeleHealth visit. The patient is located at home, the provider is located at home. The participants in this visit include the listed provider and patient. The visit was conducted today via MyChart video.  OBESITY Eriyana is here to discuss her progress with her obesity treatment plan along with follow-up of her obesity related diagnoses.   Today's visit was # 73 Starting weight: 201 lbs Starting date: 12/10/18 Weight at last in office visit: 204 lbs on 05/04/23 Total weight loss: 0 lbs at last in office visit on 05/04/23. Today's reported weight (06/01/23):  203 lbs  Nutrition Plan: keeping a food journal with goal of 1200 calories and 90 grams of protein daily   Current exercise:  walking 60-90 minutes 1-3 days per week.  Interim History:  She is struggling with a migraine for the past few days. She is doing intermittent fasting 11am-7pm and journaling. She has been more consistent with journaling over the summer. She has been meeting calorie and protein goals. She is eating out less and making healthier choices. She is eating breakfast at home. 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  Generally does not drink sugar sweetened beverages. Has been drinking ginger ale since since her migraine started 2 days ago.  Assessment/Plan:  1. Migraine Generally has migraines 2-3 times per year. Currently has a migraine and has had the migraine for 1 to 2 days. Has photosensitivity, sensitivity to noise, and nausea. Has migraines 103 times per year.  Taking Excedrin migraine with some relief.   Denies visual aura.  Plan: Stay well hydrated. Continue OTC Excedrin migraine per package directions.   2. Generalized Obesity: Current BMI 32  Myldred is currently in the action stage of change. As such, her goal is to continue with weight loss efforts.   She has agreed to keeping a food journal with goal of 1200 calories and 90 grams of protein daily.  Exercise goals: Increase frequency of exercise to 5 days/week.  Behavioral modification strategies: increasing lean protein intake, decreasing simple carbohydrates , decrease eating out, and increase water intake.  Makinze has agreed to follow-up with our clinic in 4 weeks.  No orders of the defined types were placed in this encounter.   There are no discontinued medications.   No orders of the defined types were placed in this encounter.     Objective:   VITALS: Per patient if applicable, see vitals. GENERAL: Alert and in no acute distress. CARDIOPULMONARY: No increased WOB. Speaking in clear sentences.  PSYCH: Pleasant and cooperative. Speech normal rate and rhythm. Affect is appropriate. Insight and judgement are appropriate. Attention is focused, linear, and appropriate.  NEURO: Oriented as arrived to appointment on time with no prompting.   Attestation Statements:   Reviewed by clinician on day of visit: allergies, medications, problem list, medical history, surgical history, family history, social history, and previous encounter notes.  This was prepared with the assistance of Engineer, civil (consulting).  Occasional wrong-word or sound-a-like substitutions may have occurred due to the inherent limitations of voice recognition software.

## 2023-06-01 ENCOUNTER — Encounter (INDEPENDENT_AMBULATORY_CARE_PROVIDER_SITE_OTHER): Payer: Self-pay | Admitting: Family Medicine

## 2023-06-01 ENCOUNTER — Telehealth (INDEPENDENT_AMBULATORY_CARE_PROVIDER_SITE_OTHER): Payer: BC Managed Care – PPO | Admitting: Family Medicine

## 2023-06-01 DIAGNOSIS — E669 Obesity, unspecified: Secondary | ICD-10-CM

## 2023-06-01 DIAGNOSIS — Z6832 Body mass index (BMI) 32.0-32.9, adult: Secondary | ICD-10-CM | POA: Diagnosis not present

## 2023-06-01 DIAGNOSIS — G43909 Migraine, unspecified, not intractable, without status migrainosus: Secondary | ICD-10-CM | POA: Diagnosis not present

## 2023-06-07 ENCOUNTER — Other Ambulatory Visit: Payer: Self-pay | Admitting: Family Medicine

## 2023-06-24 ENCOUNTER — Other Ambulatory Visit: Payer: Self-pay | Admitting: Family Medicine

## 2023-06-24 DIAGNOSIS — H6523 Chronic serous otitis media, bilateral: Secondary | ICD-10-CM

## 2023-06-26 ENCOUNTER — Telehealth: Payer: Self-pay | Admitting: *Deleted

## 2023-06-26 MED ORDER — LEVOTHYROXINE SODIUM 50 MCG PO TABS: 50.0000 ug | ORAL_TABLET | Freq: Every day | ORAL | 0 refills | Status: DC

## 2023-06-26 NOTE — Telephone Encounter (Signed)
Refill sent.

## 2023-06-26 NOTE — Telephone Encounter (Signed)
Who Is Calling Patient / Member / Family / Caregiver Call Type Triage / Clinical Relationship To Patient Self Return Phone Number [phone] (Primary) Chief Complaint Prescription Refill or Medication Request (non symptomatic) Reason for Call Medication Question / Request Initial Comment Caller (no current sxs) needs refill Rx [facility] to [facility] [place], [place]. She has one dose left and will be out prior to office reopening on MON. Translation No Nurse Assessment Nurse: Elijah Birk, RN, Vernona Rieger Date/Time Lamount Cohen Time): 06/24/2023 12:14:33 PM Confirm and document reason for call. If symptomatic, describe symptoms. ---States she only has one Levothyroxine tablet left and needs a refill. No symptoms. Unsure of last refill.   Disp. Time Lamount Cohen Time) Disposition Final User 06/24/2023 11:35:44 AM Send To Nurse Maudry Mayhew, RN, [facility] 06/24/2023 11:47:36 AM Attempt made - message left Elijah Birk RN, Vernona Rieger 06/24/2023 12:28:29 PM Pharmacy Call Elijah Birk, RN, Vernona Rieger Reason: Rx for Levothyroxine 50 mcg take one tablet daily po. Dispense 7 and NR. Called to patients pharmacy [facility] @ [phone] 06/24/2023 12:28:45 PM Clinical Call Yes Elijah Birk RN, Vernona Rieger Final Disposition 06/24/2023 12:28:45 PM Clinical Call Yes Elijah Birk, RN, Loma Sousa Orders/Maintenance Medications Medication Refill Route Dosage Regime Duration Admin Instructions User Name Levothyroxine 50 mcg Yes Oral 7 Days Take one tablet daily Elijah Birk, RN, Vernona Rieger

## 2023-07-03 ENCOUNTER — Ambulatory Visit (INDEPENDENT_AMBULATORY_CARE_PROVIDER_SITE_OTHER): Payer: BC Managed Care – PPO | Admitting: Family Medicine

## 2023-07-03 ENCOUNTER — Encounter (INDEPENDENT_AMBULATORY_CARE_PROVIDER_SITE_OTHER): Payer: Self-pay | Admitting: Family Medicine

## 2023-07-03 VITALS — BP 102/69 | HR 65 | Temp 98.6°F | Ht 66.0 in | Wt 204.0 lb

## 2023-07-03 DIAGNOSIS — E669 Obesity, unspecified: Secondary | ICD-10-CM

## 2023-07-03 DIAGNOSIS — E559 Vitamin D deficiency, unspecified: Secondary | ICD-10-CM | POA: Diagnosis not present

## 2023-07-03 DIAGNOSIS — E039 Hypothyroidism, unspecified: Secondary | ICD-10-CM | POA: Diagnosis not present

## 2023-07-03 DIAGNOSIS — G43909 Migraine, unspecified, not intractable, without status migrainosus: Secondary | ICD-10-CM

## 2023-07-03 DIAGNOSIS — Z6832 Body mass index (BMI) 32.0-32.9, adult: Secondary | ICD-10-CM

## 2023-07-03 NOTE — Assessment & Plan Note (Signed)
Last vitamin D Lab Results  Component Value Date   VD25OH 4.5 07/14/2021   She has not been taking a vitamin D supplement and is overdue for labs.  Lab redrawn today; if unable to get, she will go to [facility].

## 2023-07-03 NOTE — Progress Notes (Signed)
Office: [phone]  /  Fax: [phone]  WEIGHT SUMMARY AND BIOMETRICS  Starting Date: 12/10/18  Starting Weight: 201lb   Weight Lost Since Last Visit: 0lb   Vitals Temp: 98.6 F (37 C) BP: 102/69 Pulse Rate: 65 SpO2: 100 %   Body Composition  Body Fat %: 37.1 % Fat Mass (lbs): 76 lbs Muscle Mass (lbs): 122.4 lbs Total Body Water (lbs): 74.8 lbs Visceral Fat Rating : 10   HPI  Chief Complaint: OBESITY  Alisha Chen is here to discuss her progress with her obesity treatment plan. She is on the keeping a food journal and adhering to recommended goals of 1200 calories and 80 protein and states she is following her eating plan approximately 40-50 % of the time. She states she is walking and doing elliptical machine for 30 minutes 1-3 times per week.   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   She is sleeping well.  She drinks water, Ginger ale, sweet tea, lemonade on occasion She has been having migraines 1-2 x a month She takes Excedrin migraine or Tylenol prn She does not like to take medicine in general and previously had adverse SE on Saxenda She has been sleeping in instead of going for walks this Summer  Pharmacotherapy: none  PHYSICAL EXAM:  Blood pressure 102/69, pulse 65, temperature 98.6 F (37 C), height 5\' 6"  (1.676 m), weight 204 lb (92.5 kg), last menstrual period 02/27/2012, SpO2 100%. Body mass index is 32.93 kg/m.  General: She is overweight, cooperative, alert, well developed, and in no acute distress. PSYCH: Has normal mood, affect and thought process.   Lungs: Normal breathing effort, no conversational dyspnea.   ASSESSMENT AND PLAN  TREATMENT  PLAN FOR OBESITY:  Recommended Dietary Goals  Alisha Chen is currently in the action stage of change. As such, her goal is to continue weight management plan. She has agreed to keeping a food journal and adhering to recommended goals of 1200 calories and 85 + g of  protein.  Behavioral Intervention  We discussed the following Behavioral Modification Strategies today: increasing lean protein intake, decreasing simple carbohydrates , increasing vegetables, increasing lower glycemic fruits, increasing fiber rich foods, avoiding skipping meals, increasing water intake, work on meal planning and preparation, keeping healthy foods at home, work on managing stress, creating time for self-care and relaxation measures, continue to practice mindfulness when eating, and planning for success.  Additional resources provided today: NA  Recommended Physical Activity Goals  Alisha Chen has been advised to work up to 150 minutes of moderate intensity aerobic activity a week and strengthening exercises 2-3 times per week for cardiovascular health, weight loss maintenance and preservation of muscle mass.   She has agreed to Start aerobic activity with a goal of 150 minutes a week at moderate intensity.  - we discussed the importance of adding in regular exercise - challenged her to adding 30 min of walking 3+ x a week, thinking about when she can fit it into her day and thinking about home exercise options  Pharmacotherapy changes for the treatment of obesity: none; pt declined trial of topiramate for  concurrent migraines  ASSOCIATED CONDITIONS ADDRESSED TODAY  Migraine without status migrainosus, not intractable, unspecified migraine type Assessment & Plan: She has been getting 1-2 migraines per month, using Excedrin Migraine or Tylenol prn.  She cannot recall migraine triggers.  We discussed possibly adding in Topiramate for migraine prevention with the added benefit of food impulse control but she declined since  she hates to take medicine.  Will focus on proper nutrition, sleep and hydration.  Limit caffeine intake.   Generalized obesity: Starting BMI 32.4  BMI 32.0-32.9,adult  Vitamin D deficiency Assessment & Plan: Last vitamin D Lab Results  Component Value Date   VD25OH 58.5 07/14/2021   She has not been taking a vitamin D supplement and is overdue for labs.  Lab redrawn today; if unable to get, she will go to [facility].   Hypothyroidism, unspecified type Assessment & Plan: Lab Results  Component Value Date   TSH 2.550 07/14/2021   She reports good compliance on levothyroxine 50 mcg daily per her PCP.  She is overdue for her TSH, redrawn today.  If unable to get today, she will have this drawn at [facility]. She does c/o fatigue and has difficulty with her weight over the past 2 years.       She was informed of the importance of frequent follow up visits to maximize her success with intensive lifestyle modifications for her multiple health conditions.   ATTESTASTION STATEMENTS:  Reviewed by clinician on day of visit: allergies, medications, problem list, medical history, surgical history, family history, social history, and previous encounter notes pertinent to obesity diagnosis.   I have personally spent 30 minutes total time today in preparation, patient care, nutritional counseling and documentation for this visit, including the following: review of clinical lab tests; review of medical tests/procedures/services.      Glennis Brink, DO DABFM, DABOM [facility] [address] [place], [place] [address] [phone]

## 2023-07-03 NOTE — Assessment & Plan Note (Signed)
Lab Results  Component Value Date   TSH 2.550 07/14/2021   She reports good compliance on levothyroxine 50 mcg daily per her PCP.  She is overdue for her TSH, redrawn today.  If unable to get today, she will have this drawn at [facility]. She does c/o fatigue and has difficulty with her weight over the past 2 years.

## 2023-07-03 NOTE — Assessment & Plan Note (Signed)
She has been getting 1-2 migraines per month, using Excedrin Migraine or Tylenol prn.  She cannot recall migraine triggers.  We discussed possibly adding in Topiramate for migraine prevention with the added benefit of food impulse control but she declined since she hates to take medicine.  Will focus on proper nutrition, sleep and hydration.  Limit caffeine intake.

## 2023-07-06 ENCOUNTER — Other Ambulatory Visit: Payer: Self-pay

## 2023-07-17 ENCOUNTER — Encounter: Payer: Self-pay | Admitting: Family Medicine

## 2023-07-17 ENCOUNTER — Ambulatory Visit (INDEPENDENT_AMBULATORY_CARE_PROVIDER_SITE_OTHER): Payer: BC Managed Care – PPO | Admitting: Family Medicine

## 2023-07-17 VITALS — BP 108/60 | HR 90 | Temp 97.7°F | Resp 18 | Ht 66.0 in | Wt 209.8 lb

## 2023-07-17 DIAGNOSIS — E039 Hypothyroidism, unspecified: Secondary | ICD-10-CM

## 2023-07-17 DIAGNOSIS — E210 Primary hyperparathyroidism: Secondary | ICD-10-CM | POA: Diagnosis not present

## 2023-07-17 MED ORDER — LEVOTHYROXINE SODIUM 50 MCG PO TABS: 50.0000 ug | ORAL_TABLET | Freq: Every day | ORAL | 1 refills | Status: DC

## 2023-07-17 NOTE — Progress Notes (Signed)
0. 666 +  Established Patient Office Visit  Subjective   Patient ID: Alisha Chen, female    DOB: 1966-10-12  Age: 57 y.o. MRN: [id number]  Chief Complaint  Patient presents with   Hypothyroidism   Follow-up    HPI Discussed the use of AI scribe software for clinical note transcription with the patient, who gave verbal consent to proceed.  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 She reports no issues with her thyroid medication, but admits to occasionally forgetting to take her Synthroid. She has noticed that her recent labs showed slightly elevated calcium and low normal vitamin D levels. She is currently taking vitamin D3, but is unsure of the dosage. She also mentions that she has been less structured with her medication regimen over the summer months due to a change in her daily routine.      Patient Active Problem List   Diagnosis Date Noted   Class 1 obesity due to excess calories without serious comorbidity with body mass index (BMI) of 32.0 to 32.9 in adult 05/04/2023   Eating disorder 02/23/2023   SOBOE (shortness of breath on exertion) 01/16/2023   Polyphagia 10/11/2022   Inadequate caloric intake 08/18/2022   Right corneal abrasion 01/24/2022   Dry eye 01/24/2022   Hypothyroidism 06/01/2020   Other hyperlipidemia 06/01/2020   Depression 08/27/2019   COVID-19 virus detected 05/20/2019   Fever 05/13/2019   Vitamin D deficiency 03/21/2019   Insulin resistance 12/25/2018   Generalized obesity 12/12/2018   UTI (urinary tract infection) 04/24/2015   Migraine 04/24/2015   Tendinitis of wrist 09/10/2014   Left wrist pain 08/21/2014   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) 01/12/2012   Other constipation 09/24/2010   RECTAL PAIN 09/24/2010   CHANGE IN BOWELS 09/24/2010   HIP PAIN, LEFT, CHRONIC 08/23/2010   Acute  sinusitis 05/26/2008   EAR PAIN, LEFT 02/13/2008   URI 02/13/2008   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,dcis   History of chemotherapy 03/23/12- 05/10/12   s/p 4 cycles of FEC    Hx: UTI (urinary tract infection)    Knee pain    Lactose intolerance    Neck pain    S/P radiation therapy 07/10/12-08/23/12   Left Breast: 50 Gy/25 Fractions; Boost: 10 Gy/5 Fractions   Status post chemotherapy 1 dose 05/23/12   Stopped after one cycle of Taxol due to Grade 2 Toxicity   Status post chemotherapy 1 dose 05/23/12   Stopped after one cycle of Taxol due to Grade 2 Toxicity   Past Surgical History:  Procedure Laterality Date   AXILLARY SURGERY  03/06/12   left, regional resection, 1/3 nodes pos   BACK SURGERY     LUMBAR DECOMPRESSION   BREAST BIOPSY  01/09/2012   left breast 3 0'clock/ER?PR =positive,her 2 neg   BREAST LUMPECTOMY Left 02/10/12   Left Breast: 2 Foci of Invasive Ductal Caand High grade Ductal Carcinoma In Situ, 0/14 nodes Left Axilla Negative : Regional Resection of Lymph Nodes: 0/5 Nodes Negative   FOOT SURGERY     multiple   INTRAUTERINE DEVICE REMOVAL  01/24/12   needle core Biospy  01/09/12   Left Breast: Invasive Ductal Carcinoma, Lymph Node Axilla: Metastatic Mammary  Carcinoma   PORTACATH PLACEMENT  02/10/2012   Procedure: INSERTION PORT-A-CATH;  Surgeon: Emelia Loron, MD;  Location: [facility];  Service: General;  Laterality: Right;   Regional Resection  03/06/12   Left Axilla: 1/3 Nodes Metastatic Carcinoma   SPINE SURGERY  01/2011   Social History   Tobacco Use   Smoking status: Never   Smokeless tobacco: Never  Vaping Use   Vaping status: Never Used  Substance Use Topics   Alcohol use: No   Drug use: No   Social History   Socioeconomic History   Marital status: Married    Spouse name: Harvie Heck   Number of children: 1   Years of education: Not  on file   Highest education level: Not on file  Occupational History   Occupation: Runner, broadcasting/film/video, Paediatric nurse    Employer: [employer]    Comment: [employer] Aeronautical engineer: [employer]  Tobacco Use   Smoking status: Never   Smokeless tobacco: Never  Vaping Use   Vaping status: Never Used  Substance and Sexual Activity   Alcohol use: No   Drug use: No   Sexual activity: Yes    Partners: Male    Birth control/protection: None    Comment: menarche age 71,premenopausa; G!P1,1st pregnancy age 66  Other Topics Concern   Not on file  Social History Narrative   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 92 ten year old son   Social Determinants of Corporate investment banker Strain: Not on file  Food Insecurity: Not on file  Transportation Needs: Not on file  Physical Activity: Not on file  Stress: Not on file  Social Connections: Not on file  Intimate Partner Violence: Not on file   Family Status  Relation Name Status   Mother  Deceased at age 88   Father  Deceased   Brother 4 Alive   Son  Alive  No partnership data on file   Family History  Problem Relation Age of Onset   Diabetes Mother    Sudden death Mother    Schizophrenia Mother    Sleep apnea Mother    Obesity Mother    Seizures Son    Allergies  Allergen Reactions   Penicillins Other (See Comments)    Not sure, but was told by mother never to take this medication as a child   Pharmacist, hospital Other (See Comments)    Welts   Clarithromycin Other (See Comments)    REACTION: NAUSEA,WEAK,BITTER TASTE   Ondansetron Nausea And Vomiting    Headaches      Review of Systems  Constitutional:  Negative for chills, fever and malaise/fatigue.  HENT:  Negative for congestion and hearing loss.   Eyes:  Negative for discharge.  Respiratory:  Negative for cough, sputum production and shortness of breath.   Cardiovascular:  Negative for chest pain, palpitations and leg swelling.   Gastrointestinal:  Negative for abdominal pain, blood in stool, constipation, diarrhea, heartburn, nausea and vomiting.  Genitourinary:  Negative for dysuria, frequency, hematuria and urgency.  Musculoskeletal:  Negative for back pain, falls and myalgias.  Skin:  Negative for rash.  Neurological:  Negative for dizziness, sensory change, loss of consciousness, weakness and headaches.  Endo/Heme/Allergies:  Negative for environmental allergies. Does not bruise/bleed easily.  Psychiatric/Behavioral:  Negative for depression and suicidal ideas. The patient is not nervous/anxious and does not have insomnia.       Objective:  BP 108/60 (BP Location: Right Arm, Patient Position: Sitting, Cuff Size: Normal)   Pulse 90   Temp 97.7 F (36.5 C) (Oral)   Resp 18   Ht 5\' 6"  (1.676 m)   Wt 209 lb 12.8 oz (95.2 kg)   LMP 02/27/2012   SpO2 96%   BMI 33.86 kg/m  BP Readings from Last 3 Encounters:  07/17/23 108/60  07/03/23 102/69  05/04/23 105/72   Wt Readings from Last 3 Encounters:  07/17/23 209 lb 12.8 oz (95.2 kg)  07/03/23 204 lb (92.5 kg)  05/04/23 204 lb (92.5 kg)   SpO2 Readings from Last 3 Encounters:  07/17/23 96%  07/03/23 100%  05/04/23 98%      Physical Exam Vitals and nursing note reviewed.  Constitutional:      General: She is not in acute distress.    Appearance: Normal appearance. She is well-developed.  HENT:     Head: Normocephalic and atraumatic.  Eyes:     General: No scleral icterus.       Right eye: No discharge.        Left eye: No discharge.  Cardiovascular:     Rate and Rhythm: Normal rate and regular rhythm.     Heart sounds: No murmur heard. Pulmonary:     Effort: Pulmonary effort is normal. No respiratory distress.     Breath sounds: Normal breath sounds.  Musculoskeletal:        General: Normal range of motion.     Cervical back: Normal range of motion and neck supple.     Right lower leg: No edema.     Left lower leg: No edema.   Skin:    General: Skin is warm and dry.  Neurological:     Mental Status: She is alert and oriented to person, place, and time.  Psychiatric:        Mood and Affect: Mood normal.        Behavior: Behavior normal.        Thought Content: Thought content normal.        Judgment: Judgment normal.      No results found for any visits on 07/17/23.  Last CBC Lab Results  Component Value Date   WBC 4.3 05/27/2020   HGB 12.2 05/27/2020   HCT 38.8 05/27/2020   MCV 89 05/27/2020   MCH 28.1 05/27/2020   RDW 12.4 05/27/2020   PLT 227 05/27/2020   Last metabolic panel Lab Results  Component Value Date   GLUCOSE 78 07/04/2023   NA 138 07/04/2023   K 4.7 07/04/2023   CL 101 07/04/2023   CO2 25 07/04/2023   BUN 13 07/04/2023   CREATININE 0.90 07/04/2023   EGFR 74 07/04/2023   CALCIUM 10.9 (H) 07/04/2023   PROT 7.1 07/04/2023   ALBUMIN 4.1 07/04/2023   LABGLOB 3.0 07/04/2023   AGRATIO 2.0 07/14/2021   BILITOT 0.2 07/04/2023   ALKPHOS 119 07/04/2023   AST 20 07/04/2023   ALT 18 07/04/2023   ANIONGAP 8 12/25/2014   Last lipids Lab Results  Component Value Date   CHOL 212 (H) 07/04/2023   HDL 51 07/04/2023   LDLCALC 148 (H) 07/04/2023   TRIG 73 07/04/2023   CHOLHDL 4.2 07/04/2023   Last hemoglobin A1c Lab Results  Component Value Date   HGBA1C 5.0 07/04/2023   Last thyroid functions Lab Results  Component Value Date   TSH 6.220 (H) 07/04/2023   T3TOTAL 113 05/27/2020   T4TOTAL 8.9 12/25/2019  Last vitamin D Lab Results  Component Value Date   VD25OH 55.9 07/04/2023   Last vitamin B12 and Folate Lab Results  Component Value Date   VITAMINB12 1,556 (H) 12/10/2018   FOLATE 3.7 12/10/2018      The 10-year ASCVD risk score (Arnett DK, et al., 2019) is: 2.9%    Assessment & Plan:   Problem List Items Addressed This Visit       Unprioritized   Hypothyroidism - Primary   Relevant Medications   levothyroxine (SYNTHROID) 50 MCG tablet   Other  Relevant Orders   Thyroid Panel With TSH   Other Visit Diagnoses     Primary hyperparathyroidism (HCC)       Hypercalcemia       Relevant Orders   PTH, Intact and Calcium      Assessment and Plan    Hypothyroidism Suboptimal control possibly due to inconsistent medication adherence. -Continue current dose of Synthroid. -Encouraged consistent daily intake. -Recheck thyroid function tests in two months.  Hypercalcemia Mild elevation, possibly related to low Vitamin D levels. -Increase Vitamin D3 intake to at least 2000 units daily. -Recheck calcium and Vitamin D levels in two months.       No follow-ups on file.    Donato Schultz, DO

## 2023-08-02 ENCOUNTER — Telehealth (INDEPENDENT_AMBULATORY_CARE_PROVIDER_SITE_OTHER): Payer: BC Managed Care – PPO | Admitting: Family Medicine

## 2023-09-05 ENCOUNTER — Ambulatory Visit (INDEPENDENT_AMBULATORY_CARE_PROVIDER_SITE_OTHER): Payer: BC Managed Care – PPO | Admitting: Family Medicine

## 2023-09-11 ENCOUNTER — Other Ambulatory Visit: Payer: BC Managed Care – PPO

## 2023-09-18 ENCOUNTER — Other Ambulatory Visit: Payer: BC Managed Care – PPO

## 2023-09-18 DIAGNOSIS — E039 Hypothyroidism, unspecified: Secondary | ICD-10-CM

## 2023-09-19 LAB — THYROID PANEL WITH TSH
Free Thyroxine Index: 2.4 (ref 1.4–3.8)
T3 Uptake: 30 % (ref 22–35)
T4, Total: 8 ug/dL (ref 5.1–11.9)
TSH: 2.93 m[IU]/L (ref 0.40–4.50)

## 2023-09-19 LAB — PTH, INTACT AND CALCIUM
Calcium: 10.3 mg/dL (ref 8.6–10.4)
PTH: 56 pg/mL (ref 16–77)

## 2023-09-21 ENCOUNTER — Ambulatory Visit (INDEPENDENT_AMBULATORY_CARE_PROVIDER_SITE_OTHER): Payer: BC Managed Care – PPO | Admitting: Family Medicine

## 2023-09-21 ENCOUNTER — Encounter (INDEPENDENT_AMBULATORY_CARE_PROVIDER_SITE_OTHER): Payer: Self-pay | Admitting: Family Medicine

## 2023-09-21 VITALS — BP 108/69 | HR 75 | Temp 97.9°F | Ht 66.0 in | Wt 204.0 lb

## 2023-09-21 DIAGNOSIS — Z6832 Body mass index (BMI) 32.0-32.9, adult: Secondary | ICD-10-CM

## 2023-09-21 DIAGNOSIS — E7849 Other hyperlipidemia: Secondary | ICD-10-CM

## 2023-09-21 DIAGNOSIS — E669 Obesity, unspecified: Secondary | ICD-10-CM | POA: Diagnosis not present

## 2023-09-21 DIAGNOSIS — E039 Hypothyroidism, unspecified: Secondary | ICD-10-CM | POA: Diagnosis not present

## 2023-09-21 DIAGNOSIS — E038 Other specified hypothyroidism: Secondary | ICD-10-CM

## 2023-09-21 DIAGNOSIS — E66811 Obesity, class 1: Secondary | ICD-10-CM

## 2023-09-21 NOTE — Progress Notes (Signed)
[facility] MEDICAL WEIGHT [facility] [address] [place] [address] Dept: [phone] Dept Fax: [phone]  AT A GLANCE:  Vitals Temp: 97.9 F (36.6 C) BP: 108/69 Pulse Rate: 75 SpO2: 98 %   Anthropometric Measurements Height: 5\' 6"  (1.676 m) Weight: 204 lb (92.5 kg) BMI (Calculated): 32.94 Weight at Last Visit: 204 lb Weight Lost Since Last Visit: 0 Weight Gained Since Last Visit: 0 Starting Weight: 201 lbs Total Weight Loss (lbs): 0 lb (0 kg)   Body Composition  Body Fat %: 35.6 % Fat Mass (lbs): 72.8 lbs Muscle Mass (lbs): 125 lbs Total Body Water (lbs): 77.2 lbs Visceral Fat Rating : 10   Other Clinical Data Today's Visit #: 63 Starting Date: 12/10/18     SUBJECTIVE: Patient feels like she is consistently having to fight herself. She feels like she is stuck at a certain weight.  She feels this enhances her fight to keep focus on her weight and wellness. The setup of her life right now really doesn't support her spending time on herself.  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    Alisha Chen is here to discuss her progress with her obesity treatment plan. She is on the keeping a food journal and adhering to recommended goals of 1200 calories and 80 grams of protein and states she is following her eating plan approximately 40 % of the time. She states she is walking and using the elliptical 30-60 minutes 1 time per week.  She tries to not eat past 7:30/8pm as she feels better.  She is planning a trip to visit an uncle in [place] next week.   OBJECTIVE: Visit Diagnoses: Problem List Items Addressed This Visit   None      ASSESSMENT AND PLAN: Problem List Items Addressed This Visit       Endocrine   Hypothyroidism    TSH was WNL a few days ago (prior value slightly elevated). Patient is on Levothyroxine daily.  No cold intolerance, heat intolerance, palpitations on  current dose.  Repeat labs in 6 months or PC.        Other   Other hyperlipidemia - Primary    Patient last labs were a couple of months ago.  Her LDL was elevated.  She is not cholesterol controlling medication.  Labs prior to that showed elevated LDL as well.  Plan for repeat labs in January.       Diet: Lilana is currently in the action stage of change. As such, her goal is to continue with weight loss efforts. She has agreed to keeping a food journal and adhering to recommended goals of 1200 calories and 80 protein. Patient to start food log or journaling meal plan.  The initial goal will be to habitually log or journal for at least 4 days a week.  The expectation it that patient may not initially meet calorie or protein goals as the nturitional understanding of food intake is begun.  We discussed the 10:1 ratio when reading a food label.  Patient agrees to keep a food log either electronically or on paper and bring to the next appointment to be able to dissect and discuss it with provider.    Exercise: Sherline has been instructed that some exercise is better than none for weight loss and overall health benefits.   Behavior Modification:  We discussed the following Behavioral Modification Strategies today: increasing lean protein  intake, increasing vegetables, meal planning and cooking strategies, and keeping healthy foods in the home.   No follow-ups on file.Marland Kitchen She was informed of the importance of frequent follow up visits to maximize her success with intensive lifestyle modifications for her multiple health conditions.  Attestation Statements:   Reviewed by clinician on day of visit: allergies, medications, problem list, medical history, surgical history, family history, social history, and previous encounter notes.   Time spent on visit including pre-visit chart review and post-visit care and charting was 23 minutes.  Reuben Likes MD

## 2023-09-21 NOTE — Assessment & Plan Note (Addendum)
TSH was WNL a few days ago (prior value slightly elevated). Patient is on Levothyroxine daily.  No cold intolerance, heat intolerance, palpitations on current dose.  Repeat labs in 6 months or PC. I have reviewed all the lab results from PCP appointment.

## 2023-09-21 NOTE — Assessment & Plan Note (Signed)
Patient last labs were a couple of months ago.  Her LDL was elevated.  She is not cholesterol controlling medication.  Labs prior to that showed elevated LDL as well.  Plan for repeat labs in January.

## 2023-10-23 ENCOUNTER — Ambulatory Visit (INDEPENDENT_AMBULATORY_CARE_PROVIDER_SITE_OTHER): Payer: BC Managed Care – PPO | Admitting: Physician Assistant

## 2023-10-23 ENCOUNTER — Encounter (INDEPENDENT_AMBULATORY_CARE_PROVIDER_SITE_OTHER): Payer: Self-pay | Admitting: Physician Assistant

## 2023-10-23 VITALS — BP 112/73 | HR 62 | Temp 97.8°F | Ht 66.0 in | Wt 204.0 lb

## 2023-10-23 DIAGNOSIS — E88819 Insulin resistance, unspecified: Secondary | ICD-10-CM

## 2023-10-23 DIAGNOSIS — F509 Eating disorder, unspecified: Secondary | ICD-10-CM

## 2023-10-23 DIAGNOSIS — Z6833 Body mass index (BMI) 33.0-33.9, adult: Secondary | ICD-10-CM | POA: Insufficient documentation

## 2023-10-23 DIAGNOSIS — E7849 Other hyperlipidemia: Secondary | ICD-10-CM

## 2023-10-23 DIAGNOSIS — E66811 Obesity, class 1: Secondary | ICD-10-CM | POA: Diagnosis not present

## 2023-10-23 DIAGNOSIS — E785 Hyperlipidemia, unspecified: Secondary | ICD-10-CM | POA: Diagnosis not present

## 2023-10-23 NOTE — Progress Notes (Signed)
SUBJECTIVE:  Chief Complaint: Obesity Discussed the use of AI scribe software for clinical note transcription with the patient, who gave verbal consent to proceed.  History of Present Illness     Interim History: She is down 4 lbs since her last visit.  She is up 3 lbs overall.   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  The patient reports maintaining the same weight over the recent holiday period, due to a conscious effort to avoid overindulgence. She has been adhering to a diet of approximately 1200 calories per day, with a focus on protein intake. However, she expresses frustration at not losing weight and is seeking to find a sustainable approach to weight loss.  The patient's typical daily diet consists of two main meals, with breakfast often skipped. When consumed, breakfast may include a 'skinny biscuit' from a local eatery or a traditional platter of bacon and eggs. Lunch varies, with options ranging from chicken soft tacos to leftovers from home, or a Fairlife protein shake. Dinner is dependent on the remaining calorie allowance for the day, and sometimes is not consumed. The patient acknowledges that this eating pattern may be contributing to her difficulty in losing weight. We discussed the importance of regular meals and adequate protein intake for weight loss and overall health. We discussed the effects on metabolism if she is consistently under her calorie and protein goals.   In terms of physical activity, the patient alternates between walking and using an elliptical machine at home. She also occasionally engages in walking workout videos. Despite these efforts, the patient has not seen the desired progress in her weight loss journey.  The patient also reports occasional cravings for potato chips and candies, particularly when her  calorie and protein intake is completed early in the day. She has noticed that eating later in the evening, past 7:30 PM, tends to make her feel unwell. She has been making a conscious effort to avoid eating past this time.  The patient's recent laboratory results, conducted in August, indicate slightly elevated LDL cholesterol levels, although her HDL cholesterol remains within the normal/target range. She is not currently on any medication for cholesterol management.  In summary, the patient is actively engaged in managing her weight but is struggling to find a sustainable and effective approach. She is adhering to a calorie-controlled diet and maintaining regular physical activity, but is not seeing the desired weight loss. She is seeking further guidance and support in managing her obesity, insulin resistance, and emotional eating.  Alisha Chen is here to discuss her progress with her obesity treatment plan. She is on the keeping a food journal and adhering to recommended goals of 1200 calories and 80+ grams of protein and states she is following her eating plan approximately 70 % of the time. She states she is exercising elliptical 30 minutes 1-3 times per week.   OBJECTIVE: Visit Diagnoses: Problem List Items Addressed This Visit     Insulin resistance - Primary   Eating disorder   Class 1 obesity with serious comorbidity and body mass index (BMI) of 32.0 to 32.9 in adult   BMI 33.0-33.9,adult Current BMI 33.0   Obesity Obesity with insulin resistance and hypothyroidism. Weight is 204 lbs, down from 209 lbs.  Lowest weight of 192 lbs in 2022 She consumes ~1200 calories and 80 grams of protein daily, engages in regular physical activity, and  manages occasional cravings. Discussed spreading protein intake evenly and starting the day with a protein shake. - Encourage continued food journaling. - Recommend starting the day with a protein shake or other high protein source to start the day on good  footing and hopefully improve metabolic state. - Suggest spreading protein intake evenly throughout the day. - Advise incorporating quick, protein-rich meals using [employer] prepared meats for example (e.g., pre-cooked meats from [employer], [employer], [employer]). - Recommend Too Good drinkable smoothies for additional protein. - Encourage maintaining regular physical activity (walking, elliptical). May want to consider repeating a fasting IC in the next few months as no recent results.  - Schedule follow-up in 4-6 weeks.  Insulin Resistance On no medications currently. Previously on metformin but stopped due to metallic taste in mouth.  Lab Results  Component Value Date   HGBA1C 5.0 07/04/2023   HGBA1C 4.8 07/14/2021   HGBA1C 5.0 11/10/2020   Lab Results  Component Value Date   LDLCALC 148 (H) 07/04/2023   CREATININE 0.90 07/04/2023   Insulin level  15.8 not at goal and worsening. (07/04/23)  Managing through diet and exercise. No current medications for insulin resistance or cholesterol medications. Continue working on nutrition plan to decrease simple carbohydrates, increase lean proteins and exercise to promote weight loss, improve glycemic control and prevent progression to Type 2 diabetes.   Hyperlipidemia LDL is not at goal. Medication(s): None Cardiovascular risk factors: dyslipidemia, obesity (BMI >= 30 kg/m2), and sedentary lifestyle  Lab Results  Component Value Date   CHOL 212 (H) 07/04/2023   HDL 51 07/04/2023   LDLCALC 148 (H) 07/04/2023   TRIG 73 07/04/2023   CHOLHDL 4.2 07/04/2023   CHOLHDL 4.0 10/21/2019   CHOLHDL 3 08/14/2014   Lab Results  Component Value Date   ALT 18 07/04/2023   AST 20 07/04/2023   ALKPHOS 119 07/04/2023   BILITOT 0.2 07/04/2023   The 10-year ASCVD risk score (Arnett DK, et al., 2019) is: 3.2%   Values used to calculate the score:     Age: 70 years     Sex: Female     Is Non-Hispanic African American: Yes     Diabetic: No     Tobacco  smoker: No     Systolic Blood Pressure: 112 mmHg     Is BP treated: No     HDL Cholesterol: 51 mg/dL     Total Cholesterol: 212 mg/dL  Plan: Could consider low dose or low frequency statin therapy.  Continue to work on nutrition plan -decreasing simple carbohydrates, increasing lean proteins, decreasing saturated fats and cholesterol , avoiding trans fats and exercise as able to promote weight loss, improve lipids and decrease cardiovascular risks.   General Health Maintenance Actively managing weight and diet. HDL levels are good. - Encourage regular cholesterol monitoring. - Advise maintaining a balanced diet with adequate protein and fiber. - Recommend regular physical activity.  Follow-up - Schedule follow-up in 4-6 weeks.  Vitals Temp: 97.8 F (36.6 C) BP: 112/73 Pulse Rate: 62 SpO2: 98 %   Anthropometric Measurements Height: 5\' 6"  (1.676 m) Weight: 204 lb (92.5 kg) BMI (Calculated): 32.94 Weight at Last Visit: 204lb Weight Lost Since Last Visit: 0 Weight Gained Since Last Visit: 0 Starting Weight: 201lb Total Weight Loss (lbs): 0 lb (0 kg)   Body Composition  Body Fat %: 35.9 % Fat Mass (lbs): 73.4 lbs Muscle Mass (lbs): 124.2 lbs Total Body Water (lbs): 74 lbs Visceral Fat Rating : 10  Other Clinical Data Fasting: yes Labs: no Today's Visit #: 23 Starting Date: 12/10/18     ASSESSMENT AND PLAN:  Diet: Danise is currently in the action stage of change. As such, her goal is to continue with weight loss efforts. She has agreed to keeping a food journal and adhering to recommended goals of 1200 calories and 80+ grams of protein.  Exercise: Marisue has been instructed to work up to a goal of 150 minutes of combined cardio and strengthening exercise per week for weight loss and overall health benefits.   Behavior Modification:  We discussed the following Behavioral Modification Strategies today: increasing lean protein intake, decreasing simple  carbohydrates, increasing vegetables, increase H2O intake, increase high fiber foods, no skipping meals, meal planning and cooking strategies, holiday eating strategies, planning for success, and keep a strict food journal. We discussed various medication options to help Anelie with her weight loss efforts and we both agreed to continue to work on nutritional and behavioral strategies to promote weight loss.    Return in about 4 weeks (around 11/20/2023).Marland Kitchen She was informed of the importance of frequent follow up visits to maximize her success with intensive lifestyle modifications for her multiple health conditions.  Attestation Statements:   Reviewed by clinician on day of visit: allergies, medications, problem list, medical history, surgical history, family history, social history, and previous encounter notes.   Time spent on visit including pre-visit chart review and post-visit care and charting was 43 minutes.    Nakiesha Rumsey, PA-C

## 2023-11-27 ENCOUNTER — Ambulatory Visit (INDEPENDENT_AMBULATORY_CARE_PROVIDER_SITE_OTHER): Payer: BC Managed Care – PPO | Admitting: Physician Assistant

## 2023-11-27 ENCOUNTER — Encounter (INDEPENDENT_AMBULATORY_CARE_PROVIDER_SITE_OTHER): Payer: Self-pay | Admitting: Physician Assistant

## 2023-11-27 NOTE — Progress Notes (Deleted)
   SUBJECTIVE:  Chief Complaint: Obesity  Interim History: ***  Alisha Chen is here to discuss her progress with her obesity treatment plan. She is on the {HWW Weight Loss Plan:210964005} and states she {CHL AMB IS/IS NOT:210130109} following her eating plan approximately *** % of the time. She states she {CHL AMB IS/IS NOT:210130109} exercising *** minutes *** times per week.  Pharmacotherapy: Metformin  caused metallic taste. Saxenda -constipation>hemorrhoids Declined topamax 05/27/20 and Contrave 08/26/20    OBJECTIVE: Visit Diagnoses: Problem List Items Addressed This Visit     Insulin  resistance - Primary   Vitamin D  deficiency   Other hyperlipidemia   Class 1 obesity with serious comorbidity and body mass index (BMI) of 32.0 to 32.9 in adult    No data recorded No data recorded No data recorded No data recorded   ASSESSMENT AND PLAN:  Diet: Alisha Chen {CHL AMB IS/IS NOT:210130109} currently in the action stage of change. As such, her goal is to {HWW Weight Loss Efforts:210964006}. She {HAS HAS WNU:81165} agreed to {HWW Weight Loss Plan:210964005}.  Exercise: Alisha Chen has been instructed {HWW Exercise:210964007} for weight loss and overall health benefits.   Behavior Modification:  We discussed the following Behavioral Modification Strategies today: {HWW Behavior Modification:210964008}. We discussed various medication options to help Alisha Chen with her weight loss efforts and we both agreed to ***.  No follow-ups on file.Alisha Chen She was informed of the importance of frequent follow up visits to maximize her success with intensive lifestyle modifications for her multiple health conditions.  Attestation Statements:   Reviewed by clinician on day of visit: allergies, medications, problem list, medical history, surgical history, family history, social history, and previous encounter notes.   Time spent on visit including pre-visit chart review and post-visit care and charting was *** minutes.     Alisha Solem, PA-C

## 2023-12-07 ENCOUNTER — Other Ambulatory Visit: Payer: Self-pay | Admitting: Adult Health

## 2023-12-07 DIAGNOSIS — Z0000 Encounter for general adult medical examination without abnormal findings: Secondary | ICD-10-CM

## 2023-12-14 ENCOUNTER — Inpatient Hospital Stay: Payer: BC Managed Care – PPO | Attending: Adult Health | Admitting: Adult Health

## 2023-12-15 ENCOUNTER — Telehealth: Payer: Self-pay | Admitting: Adult Health

## 2023-12-15 ENCOUNTER — Telehealth: Payer: Self-pay

## 2023-12-15 NOTE — Telephone Encounter (Signed)
Left patient a vm regarding upcoming appointments

## 2023-12-15 NOTE — Telephone Encounter (Signed)
Pt called in to reschedule her appt. On 1/23 wit Np. She left a message with schedulers saying that she would not be able to make it because of the weather. Pt needs to be rescheduled. I will send a scheduling message so they can reach out to the patient to get her rescheduled.

## 2023-12-20 ENCOUNTER — Ambulatory Visit (INDEPENDENT_AMBULATORY_CARE_PROVIDER_SITE_OTHER): Payer: 59 | Admitting: Physician Assistant

## 2023-12-20 ENCOUNTER — Encounter (INDEPENDENT_AMBULATORY_CARE_PROVIDER_SITE_OTHER): Payer: Self-pay | Admitting: Physician Assistant

## 2023-12-20 VITALS — BP 118/72 | HR 79 | Temp 98.1°F | Ht 66.0 in | Wt 204.0 lb

## 2023-12-20 DIAGNOSIS — Z6833 Body mass index (BMI) 33.0-33.9, adult: Secondary | ICD-10-CM

## 2023-12-20 DIAGNOSIS — E7849 Other hyperlipidemia: Secondary | ICD-10-CM | POA: Diagnosis not present

## 2023-12-20 DIAGNOSIS — E039 Hypothyroidism, unspecified: Secondary | ICD-10-CM

## 2023-12-20 DIAGNOSIS — E669 Obesity, unspecified: Secondary | ICD-10-CM | POA: Diagnosis not present

## 2023-12-20 DIAGNOSIS — E88819 Insulin resistance, unspecified: Secondary | ICD-10-CM | POA: Diagnosis not present

## 2023-12-20 NOTE — Progress Notes (Addendum)
SUBJECTIVE: Discussed the use of AI scribe software for clinical note transcription with the patient, who gave verbal consent to proceed.  Chief Complaint: Obesity  Interim History: She has maintained her weight since last visit.  She is up 3 lbs overall. The patient is a 58 year old female who presents for follow-up of her obesity treatment plan.  She is currently maintaining her weight and muscle mass but is not losing weight. She exercises on the elliptical machine a few times a week and journals her food intake, aiming for around 1200 calories daily, though she sometimes exceeds this by  as much as 400 calories. She struggles with balancing calorie intake and protein needs, often choosing protein even if it means exceeding her calorie goal. She manages her intake well on some days, achieving her targets for three consecutive days recently.  She practices intermittent fasting, eating between 11:30 AM and 7:30 PM. Occasionally, she starts eating later due to work commitments but tries to maintain her fasting window. She incorporates Metamucil for fiber intake, which helps her feel full until her first meal. She experiences cravings in the evening, especially after returning home and watching TV, but tries to avoid snacking. Her evening meals often use up a significant portion of her daily calorie allowance, and she aims for at least 30 grams of protein per meal. She supplements her protein intake with Fairlife shakes, usually opting for the 26-gram or 42-gram versions, and consumes 80-calorie [place] yogurts with 12-13 grams of protein.  She previously tried [place] but experienced severe constipation and is hesitant to try similar medications again. She is hesitant to try any medications at this point and prefers to try to continue to work on following her nutrition plan and exercise.  She has a history of insulin resistance, hyperlipidemia, and hyperthyroidism. Her last TSH level was checked in  October and was within normal limits. Her energy levels are 'okay'.  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  Alisha Chen is here to discuss her progress with her obesity treatment plan. She is on the keeping a food journal and adhering to recommended goals of 1200-1300 calories and 80+ grams of protein and states she is following her eating plan approximately 40 % of the time. She states she is exercising elliptical machine 30 minutes 1-3 times per week.  Pharmacotherapy: Bernie Covey previously but stopped due to adverse SE/constipation.  OBJECTIVE: Visit Diagnoses: Problem List Items Addressed This Visit     Generalized obesity   Insulin resistance - Primary   Hypothyroidism   Other hyperlipidemia   BMI 33.0-33.9,adult Current BMI 33.0  Obesity Maintaining muscle mass but not achieving significant weight loss. Currently on a 1200 calorie diet with intermittent fasting (11:30 AM to 7:30 PM). Experiencing evening cravings and occasional difficulty balancing protein intake with calorie limits. Last TSH level in October was normal. Discussed benefits of focusing on protein intake and using Metamucil to manage cravings. Recommended [employer] prepared meats and Chobani high-protein drinkable yogurts as convenient protein sources. - Continue 1200 calorie diet with focus on protein intake - Incorporate Metamucil in the evenings to manage cravings - Try [employer] prepared meats for convenient protein sources - Consider Chobani high-protein drinkable yogurts as an alternative to [employer] - consider Repeating fasting IC/ metabolism test to assess any changes  Hyperlipidemia  Managing hyperlipidemia with dietary modifications. Would like to avoid taking medications.  The 10-year ASCVD risk score (Arnett DK, et al., 2019) is: 3.8%    Values used to calculate the score:     Age: 47 years     Sex: Female     Is Non-Hispanic African American: Yes     Diabetic: No     Tobacco smoker: No     Systolic Blood Pressure: 118 mmHg     Is BP treated: No     HDL Cholesterol: 51 mg/dL     Total Cholesterol: 212 mg/dL  Last lipids Lab Results  Component Value Date   CHOL 212 (H) 07/04/2023   HDL 51 07/04/2023   LDLCALC 148 (H) 07/04/2023   TRIG 73 07/04/2023   CHOLHDL 4.2 07/04/2023    Continue to work on nutrition plan -decreasing simple carbohydrates, increasing lean proteins, decreasing saturated fats and cholesterol , avoiding trans fats and exercise as able to promote weight loss, improve lipids and decrease cardiovascular risks.   Hyperthyroidism Last TSH level in October was normal. No recent symptoms of thyroid dysfunction. Reports energy level is pretty good. 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   Lab Results  Component Value Date   TSH 2.93 09/18/2023  Continue nutrition plan and exercise to promote healthy weight loss.  Periodic monitoring of thyroid function.    General Health Maintenance Engaging in regular exercise using the elliptical machine a few times a week and mindful of diet. Incorporating fiber supplements (Metamucil) and exploring new protein sources. - Continue regular exercise - Maintain dietary mindfulness and journaling - Incorporate new protein sources like [employer] prepared meats and Chobani high-protein yogurts  Follow-up - Schedule follow-up appointment for February 26 at 11:30 AM. Consider repeating IC over the next few months if continues to struggle .   Vitals Temp: 98.1 F (36.7 C) BP: 118/72 Pulse Rate: 79 SpO2: 98 %   Anthropometric Measurements Height: 5\' 6"  (1.676 m) Weight: 204 lb (92.5 kg) BMI (Calculated): 32.94 Weight at Last Visit: 204 lb Weight Lost Since Last Visit: 0 Weight Gained Since Last Visit: 0 Starting Weight: 201  lb Total Weight Loss (lbs): 0 lb (0 kg)   Body Composition  Body Fat %: 35.9 % Fat Mass (lbs): 73.4 lbs Muscle Mass (lbs): 124.4 lbs Total Body Water (lbs): 75.4 lbs Visceral Fat Rating : 10   Other Clinical Data Fasting: yes Labs: no Today's Visit #: 69 Starting Date: 12/10/18     ASSESSMENT AND PLAN:  Diet: Abisola is currently in the action stage of change. As such, her goal is to continue with weight loss efforts. She has agreed to keeping a food journal and adhering to recommended goals of 1200-1300 calories and 80+ grams protein.  Exercise: Shaterra has been instructed to work up to a goal of 150 minutes of combined cardio and strengthening exercise per week for weight loss and overall health benefits.   Behavior Modification:  We discussed the following Behavioral Modification Strategies today: increasing lean protein intake, decreasing simple carbohydrates, increasing vegetables, increase H2O intake, increase high fiber foods, no skipping meals, meal planning and cooking strategies, ways to avoid nighttime snacking, avoiding temptations, and planning for success. We discussed various medication options to help Odesser with her weight loss efforts and we both agreed to continue to work on nutritional and behavioral strategies to promote weight loss.  .  Return in about 4 weeks (around 01/17/2024).Marland Kitchen She was informed  of the importance of frequent follow up visits to maximize her success with intensive lifestyle modifications for her multiple health conditions.  Attestation Statements:   Reviewed by clinician on day of visit: allergies, medications, problem list, medical history, surgical history, family history, social history, and previous encounter notes.   Time spent on visit including pre-visit chart review and post-visit care and charting was 42 minutes.    Piera Downs, PA-C

## 2024-01-02 ENCOUNTER — Inpatient Hospital Stay: Payer: 59 | Attending: Adult Health | Admitting: Adult Health

## 2024-01-02 VITALS — BP 128/82 | HR 97 | Temp 97.9°F | Resp 16 | Wt 207.3 lb

## 2024-01-02 DIAGNOSIS — Z818 Family history of other mental and behavioral disorders: Secondary | ICD-10-CM | POA: Insufficient documentation

## 2024-01-02 DIAGNOSIS — Z79899 Other long term (current) drug therapy: Secondary | ICD-10-CM | POA: Insufficient documentation

## 2024-01-02 DIAGNOSIS — Z9221 Personal history of antineoplastic chemotherapy: Secondary | ICD-10-CM | POA: Insufficient documentation

## 2024-01-02 DIAGNOSIS — Z853 Personal history of malignant neoplasm of breast: Secondary | ICD-10-CM | POA: Diagnosis present

## 2024-01-02 DIAGNOSIS — Z825 Family history of asthma and other chronic lower respiratory diseases: Secondary | ICD-10-CM | POA: Insufficient documentation

## 2024-01-02 DIAGNOSIS — Z87440 Personal history of urinary (tract) infections: Secondary | ICD-10-CM | POA: Insufficient documentation

## 2024-01-02 DIAGNOSIS — Z8349 Family history of other endocrine, nutritional and metabolic diseases: Secondary | ICD-10-CM | POA: Diagnosis not present

## 2024-01-02 DIAGNOSIS — E669 Obesity, unspecified: Secondary | ICD-10-CM | POA: Diagnosis not present

## 2024-01-02 DIAGNOSIS — Z833 Family history of diabetes mellitus: Secondary | ICD-10-CM | POA: Diagnosis not present

## 2024-01-02 DIAGNOSIS — Z8616 Personal history of COVID-19: Secondary | ICD-10-CM | POA: Insufficient documentation

## 2024-01-02 DIAGNOSIS — E039 Hypothyroidism, unspecified: Secondary | ICD-10-CM | POA: Insufficient documentation

## 2024-01-02 DIAGNOSIS — E7849 Other hyperlipidemia: Secondary | ICD-10-CM | POA: Insufficient documentation

## 2024-01-02 DIAGNOSIS — C50412 Malignant neoplasm of upper-outer quadrant of left female breast: Secondary | ICD-10-CM

## 2024-01-03 NOTE — Assessment & Plan Note (Signed)
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  She is status postlumpectomy followed by adjuvant chemotherapy, adjuvant radiation, and was unable to tolerate antiestrogen therapy.  History of breast cancer.  She has no signs of recurrence.  On observation alone. -Continue with annual mammograms next scheduled 01/11/2024.  Health maintenance -Continue to exercise -Continue healthy diet -Continue f/u with PCP for preventative care  RTC in 1 year for long term surveillance.

## 2024-01-03 NOTE — Progress Notes (Signed)
[facility] Cancer Follow up:    Alisha Schultz, DO [address] [place] [address]   DIAGNOSIS:  Cancer Staging  Primary cancer of upper outer quadrant of left female breast [facility]) Staging form: Breast, AJCC 7th Edition - Clinical: Stage IIA (T1c, N1, cM0) - Signed by Lonie Peak, MD on 01/18/2012 Laterality: Left - Pathologic: No stage assigned - Unsigned Laterality: Left   SUMMARY OF ONCOLOGIC HISTORY: Oncology History  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)  03/12/2012 Surgery   left breast lumpectomy that revealed 2 foci of invasive ductal carcinoma measuring 1.3 cm (ER87%,PR 100% Ki 40%)and 0.8 cm (Er96%, PR 99% Ki 74%)grade 2 with adjacent high-grade ductal carcinoma in situ with no evidence of angiolymphatic invasion.1/22 LN   03/23/2012 - 05/10/2012 Chemotherapy   Adjuvant chemo FEC 100 foll by Taxol X 12   07/10/2012 - 08/23/2012 Radiation Therapy   Adj XRT   09/14/2012 - 10/05/2012 Anti-estrogen oral therapy   Tamoxifen 20 mg 3 weeks stopped because of intolerance     CURRENT THERAPY: observation  INTERVAL HISTORY: Alisha Chen 58 y.o. female returns for f/u of her history of breast cancer.  She is doing well today.  She is working on getting back into healthy diet and exercise.  She is scheduled for bilateral breast 3D screening mammogram on 01/11/2024 at the breast center.  She denies any breast changes or health changes since her last visit with [place] one year ago.    Patient Active Problem List   Diagnosis Date Noted   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) 01/12/2012    Priority: High   BMI 33.0-33.9,adult Current BMI 33.0 10/23/2023   Class 1 obesity with serious comorbidity and body mass index (BMI) of 32.0 to 32.9 in adult 05/04/2023   Eating disorder 02/23/2023   SOBOE (shortness of breath on exertion) 01/16/2023   Polyphagia 10/11/2022   Inadequate caloric  intake 08/18/2022   Right corneal abrasion 01/24/2022   Dry eye 01/24/2022   Hypothyroidism 06/01/2020   Other hyperlipidemia 06/01/2020   Depression 08/27/2019   COVID-19 virus detected 05/20/2019   Fever 05/13/2019   Vitamin D deficiency 03/21/2019   Insulin resistance 12/25/2018   Generalized obesity 12/12/2018   UTI (urinary tract infection) 04/24/2015   Migraine 04/24/2015   Tendinitis of wrist 09/10/2014   Left wrist pain 08/21/2014   Other constipation 09/24/2010   RECTAL PAIN 09/24/2010   CHANGE IN BOWELS 09/24/2010   HIP PAIN, LEFT, CHRONIC 08/23/2010   Acute sinusitis 05/26/2008   Otalgia 02/13/2008   Acute upper respiratory infection 02/13/2008    is allergic to penicillins, shellfish-derived products, clarithromycin, and ondansetron.  MEDICAL HISTORY: 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,dcis   History of chemotherapy 03/23/12- 05/10/12   s/p 4 cycles of FEC    Hx: UTI (urinary tract infection)    Knee pain    Lactose intolerance    Neck pain    S/P radiation therapy 07/10/12-08/23/12   Left Breast: 50 Gy/25 Fractions; Boost: 10 Gy/5 Fractions   Status post chemotherapy 1 dose 05/23/12   Stopped after one cycle of Taxol due to Grade 2 Toxicity   Status post chemotherapy 1 dose 05/23/12   Stopped after one cycle of Taxol due to Grade  2 Toxicity    SURGICAL HISTORY: Past Surgical History:  Procedure Laterality Date   AXILLARY SURGERY  03/06/12   left, regional resection, 1/3 nodes pos   BACK SURGERY     LUMBAR DECOMPRESSION   BREAST BIOPSY  01/09/2012   left breast 3 0'clock/ER?PR =positive,her 2 neg   BREAST LUMPECTOMY Left 02/10/12   Left Breast: 2 Foci of Invasive Ductal Caand High grade Ductal Carcinoma In Situ, 0/14 nodes Left Axilla Negative : Regional Resection of Lymph Nodes: 0/5 Nodes Negative   FOOT SURGERY     multiple   INTRAUTERINE DEVICE REMOVAL   01/24/12   needle core Biospy  01/09/12   Left Breast: Invasive Ductal Carcinoma, Lymph Node Axilla: Metastatic Mammary Carcinoma   PORTACATH PLACEMENT  02/10/2012   Procedure: INSERTION PORT-A-CATH;  Surgeon: Emelia Loron, MD;  Location: [facility];  Service: General;  Laterality: Right;   Regional Resection  03/06/12   Left Axilla: 1/3 Nodes Metastatic Carcinoma   SPINE SURGERY  01/2011    SOCIAL HISTORY: Social History   Socioeconomic History   Marital status: Married    Spouse name: Harvie Heck   Number of children: 1   Years of education: Not on file   Highest education level: Not on file  Occupational History   Occupation: Runner, broadcasting/film/video, Secondary school teacher: [employer]    Comment: Page Aeronautical engineer: [employer]  Tobacco Use   Smoking status: Never   Smokeless tobacco: Never  Vaping Use   Vaping status: Never Used  Substance and Sexual Activity   Alcohol use: No   Drug use: No   Sexual activity: Yes    Partners: Male    Birth control/protection: None    Comment: menarche age 79,premenopausa; G!P1,1st pregnancy age 57  Other Topics Concern   Not on file  Social History Narrative   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  HENT:   Negative for hearing loss, lump/mass and trouble swallowing.   Eyes:  Negative for eye problems and  icterus.  Respiratory:  Negative for chest tightness, cough and shortness of breath.   Cardiovascular:  Negative for chest pain, leg swelling and palpitations.  Gastrointestinal:  Negative for abdominal distention, abdominal pain, constipation, diarrhea, nausea and vomiting.  Endocrine: Negative for hot flashes.  Genitourinary:  Negative for difficulty urinating.   Musculoskeletal:  Negative for arthralgias.  Skin:  Negative for itching and rash.  Neurological:  Negative for dizziness, extremity weakness, headaches and numbness.  Hematological:  Negative for adenopathy. Does not bruise/bleed easily.  Psychiatric/Behavioral:  Negative for depression. The patient is not nervous/anxious.       PHYSICAL EXAMINATION    Vitals:   01/02/24 1327  BP: 128/82  Pulse: 97  Resp: 16  Temp: 97.9 F (36.6 C)  SpO2: 100%    Physical Exam Constitutional:      General: She is not in acute distress.    Appearance: Normal appearance. She is not toxic-appearing.  HENT:     Head: Normocephalic and atraumatic.     Mouth/Throat:     Mouth: Mucous membranes are moist.     Pharynx: Oropharynx is clear. No oropharyngeal exudate or posterior oropharyngeal erythema.  Eyes:     General: No scleral icterus. Cardiovascular:     Rate and Rhythm: Normal rate and regular rhythm.     Pulses: Normal pulses.     Heart sounds: Normal heart sounds.  Pulmonary:     Effort: Pulmonary effort is normal.     Breath sounds: Normal breath sounds.  Chest:     Comments: Left breast s/p lumpectomy and radiation, no sign of local recurrence, right breast benign Abdominal:     General: Abdomen is flat. Bowel sounds are normal. There is no distension.     Palpations: Abdomen is soft.     Tenderness: There is no abdominal tenderness.  Musculoskeletal:        General: No swelling.     Cervical back: Neck supple.  Lymphadenopathy:     Cervical: No cervical adenopathy.     Upper Body:     Right upper body: No  axillary adenopathy.     Left upper body: No axillary adenopathy.  Skin:    General: Skin is warm and dry.     Findings: No rash.  Neurological:     General: No focal deficit present.     Mental Status: She is alert.  Psychiatric:        Mood and Affect: Mood normal.        Behavior: Behavior normal.     LABORATORY DATA:  CBC    Component Value Date/Time   WBC 4.3 05/27/2020 1351   WBC 5.1 12/25/2014 1348   WBC 4.0 08/14/2014 0933   RBC 4.34 05/27/2020 1351   RBC 4.03 12/25/2014 1348   RBC 4.32 08/14/2014 0933   HGB 12.2 05/27/2020 1351   HGB 11.7 12/25/2014 1348   HCT 38.8 05/27/2020 1351   HCT 35.9 12/25/2014 1348   PLT 227 05/27/2020 1351   MCV 89 05/27/2020 1351   MCV 89.1 12/25/2014 1348   MCH 28.1 05/27/2020 1351   MCH 29.0 12/25/2014 1348   MCH 27.1 02/09/2012 0900   MCHC 31.4 (L) 05/27/2020 1351   MCHC 32.6 12/25/2014 1348   MCHC 33.1 08/14/2014 0933   RDW 12.4 05/27/2020 1351   RDW 12.6 12/25/2014 1348   LYMPHSABS 1.2 05/27/2020 1351   LYMPHSABS 1.6 12/25/2014 1348   MONOABS 0.3 12/25/2014 1348   EOSABS 0.1 05/27/2020 1351   BASOSABS 0.0 05/27/2020 1351   BASOSABS 0.0 12/25/2014 1348    CMP     Component Value Date/Time   NA 138 07/04/2023 1330   NA 140 12/25/2014 1349   K 4.7 07/04/2023 1330   K 4.4 12/25/2014 1349   CL 101 07/04/2023 1330   CL 105 12/21/2012 1527   CO2 25 07/04/2023 1330   CO2 27 12/25/2014 1349   GLUCOSE 78 07/04/2023 1330   GLUCOSE 72 12/25/2019 0854   GLUCOSE 71 12/25/2014 1349   GLUCOSE 75 12/21/2012 1527   BUN 13 07/04/2023 1330   BUN 22.5 12/25/2014 1349   CREATININE 0.90 07/04/2023 1330   CREATININE 0.9 12/25/2014 1349   CALCIUM 10.3 09/18/2023 1049  CALCIUM 10.2 12/25/2014 1349   PROT 7.1 07/04/2023 1330   PROT 6.9 12/25/2014 1349   ALBUMIN 4.1 07/04/2023 1330   ALBUMIN 3.7 12/25/2014 1349   AST 20 07/04/2023 1330   AST 19 12/25/2014 1349   ALT 18 07/04/2023 1330   ALT 19 12/25/2014 1349   ALKPHOS 119  07/04/2023 1330   ALKPHOS 116 12/25/2014 1349   BILITOT 0.2 07/04/2023 1330   BILITOT 0.28 12/25/2014 1349   GFRNONAA 83 11/10/2020 1007   GFRAA 96 11/10/2020 1007        ASSESSMENT and THERAPY PLAN:   Primary cancer of upper outer quadrant of left female breast Sion is a 58 year old woman who was diagnosed with stage IIa ER/PR positive breast cancer of the left breast in April 2013.  She is status postlumpectomy followed by adjuvant chemotherapy, adjuvant radiation, and was unable to tolerate antiestrogen therapy.  History of breast cancer.  She has no signs of recurrence.  On observation alone. -Continue with annual mammograms next scheduled 01/11/2024.  Health maintenance -Continue to exercise -Continue healthy diet -Continue f/u with PCP for preventative care  RTC in 1 year for long term surveillance.   All questions were answered. The patient knows to call the clinic with any problems, questions or concerns. We can certainly see the patient much sooner if necessary.  Total encounter time:20 minutes*in face-to-face visit time, chart review, lab review, care coordination, order entry, and documentation of the encounter time.   Lillard Anes, NP 01/03/24 1:57 PM Medical Oncology and Hematology [facility] [address] [place], [place] [address] Tel. [phone]    Fax. [phone]  *Total Encounter Time as defined by the Centers for Medicare and Medicaid Services includes, in addition to the face-to-face time of a patient visit (documented in the note above) non-face-to-face time: obtaining and reviewing outside history, ordering and reviewing medications, tests or procedures, care coordination (communications with other health care professionals or caregivers) and documentation in the medical record.

## 2024-01-11 ENCOUNTER — Ambulatory Visit: Payer: BC Managed Care – PPO

## 2024-01-17 ENCOUNTER — Encounter (INDEPENDENT_AMBULATORY_CARE_PROVIDER_SITE_OTHER): Payer: Self-pay | Admitting: Physician Assistant

## 2024-01-17 ENCOUNTER — Ambulatory Visit (INDEPENDENT_AMBULATORY_CARE_PROVIDER_SITE_OTHER): Payer: 59 | Admitting: Physician Assistant

## 2024-01-17 VITALS — BP 109/73 | HR 67 | Temp 98.2°F | Ht 66.0 in | Wt 206.0 lb

## 2024-01-17 DIAGNOSIS — E210 Primary hyperparathyroidism: Secondary | ICD-10-CM

## 2024-01-17 DIAGNOSIS — E7849 Other hyperlipidemia: Secondary | ICD-10-CM

## 2024-01-17 DIAGNOSIS — Z6832 Body mass index (BMI) 32.0-32.9, adult: Secondary | ICD-10-CM

## 2024-01-17 DIAGNOSIS — E039 Hypothyroidism, unspecified: Secondary | ICD-10-CM | POA: Diagnosis not present

## 2024-01-17 DIAGNOSIS — E88819 Insulin resistance, unspecified: Secondary | ICD-10-CM

## 2024-01-17 DIAGNOSIS — E66811 Obesity, class 1: Secondary | ICD-10-CM

## 2024-01-17 NOTE — Progress Notes (Signed)
 SUBJECTIVE: Discussed the use of AI scribe software for clinical note transcription with the patient, who gave verbal consent to proceed.  Chief Complaint: Obesity  Interim History: She is up 2 lbs since last visit.   Alisha Chen is here to discuss her progress with her obesity treatment plan. She is on the keeping a food journal and adhering to recommended goals of 1200-1300 calories and 80 grams of protein and states she is following her eating plan approximately 50 % of the time. She states she is exercising elliptical 30 minutes 3-5 times per week.  Alisha Chen is a 58 year old female with obesity who presents for follow-up of her obesity treatment plan.  She has been experiencing challenges with her obesity management, particularly with maintaining a consistent exercise routine due to weather conditions, although she has been more mindful of her movement. She engages in exercises such as crunches and using the elliptical. There was a discussion about alternative protein sources, and she mentioned having purchased Alisha Chen prepared meats, although she has not yet consumed it.  Her adherence to a 1200-1300 calorie diet has been inconsistent, particularly with higher calorie choices at lunch. She is focusing on increasing protein intake while keeping calories low, acknowledging the difficulty in finding the right balance.  Her last fasting IC-metabolic test was in 2021, although she recalls having one more recently but we do not see that documented. She is willing to come in early for testing to obtain more information about her current status.  Her last A1c was 5.0, and her insulin level was 15. She has a history of insulin resistance and hyperlipidemia. Her cholesterol levels were last checked in August of the previous year, with an LDL cholesterol of 148, HDL cholesterol of 51, and triglycerides of 73. She is not currently on cholesterol medication.  She has a history of hypothyroidism, with a  previous parathyroid level and TSH that were within normal limits. We plan repeating these tests to monitor her condition.  She has been exercising regularly and is mindful of her protein intake. She has not experienced any adverse reactions to her current regimen and prefers to continue with her current treatment plan. OBJECTIVE: Visit Diagnoses: Problem List Items Addressed This Visit     Insulin resistance - Primary   Hypothyroidism   Other hyperlipidemia   Class 1 obesity with serious comorbidity and body mass index (BMI) of 32.0 to 32.9 in adult   Other Visit Diagnoses       Primary hyperparathyroidism (HCC)         Obesity She is undergoing treatment for obesity and has been practicing intermittent fasting, consuming between 11:30 AM and 7:30 PM. Despite efforts, she is experiencing difficulty in weight loss. A fasting metabolic test has not been conducted since 2021, which may provide insights into her metabolic rate and caloric needs. She is not interested in alternative medications like metformin or Saxenda. - Schedule a fasting metabolic test and labs for April 1st at 7:30 AM - Continue current exercise regimen - Maintain focus on protein intake and calorie management  Hyperlipidemia Her LDL cholesterol is elevated at 148 mg/dL, which is concerning given her age and post-menopausal status, increasing her risk for cardiovascular events. Her HDL cholesterol is at 51 mg/dL, and triglycerides are at 73 mg/dL, both within acceptable ranges. Dietary modifications and exercise are encouraged to manage cholesterol levels. The potential need for statin therapy will be reassessed after further testing. - Repeat cholesterol panel in April -  Encourage dietary modifications to reduce LDL cholesterol - Continue exercise regimen  Insulin resistance Her insulin level was 15, with a target closer to 5. Maintaining this level is important to prevent complications associated with insulin  resistance. - Recheck insulin levels in April  Hypothyroidism Her TSH levels were previously normal, indicating well-managed hypothyroidism. Continued monitoring is necessary to ensure stability. - Monitor TSH levels in April  Primary hyperparathyroidism (suspected) She had a previous suspicion of primary hyperparathyroidism, but follow-up tests showed normal parathyroid levels and normal calcium levels after increasing her vitamin D supplement. Monitoring calcium levels is necessary to ensure stability. - Monitor calcium levels in April  Vitals Temp: 98.2 F (36.8 C) BP: 109/73 Pulse Rate: 67 SpO2: 98 %   Anthropometric Measurements Height: 5\' 6"  (1.676 m) Weight: 206 lb (93.4 kg) BMI (Calculated): 33.27 Weight at Last Visit: 204 lb Weight Lost Since Last Visit: 0 Weight Gained Since Last Visit: 2 lb Starting Weight: 201 lb Total Weight Loss (lbs): 0 lb (0 kg)   Body Composition  Body Fat %: 38.1 % Fat Mass (lbs): 78.8 lbs Muscle Mass (lbs): 121.4 lbs Total Body Water (lbs): 75 lbs Visceral Fat Rating : 10   Other Clinical Data Fasting: no Labs: no Today's Visit #: 52 Starting Date: 12/10/18     ASSESSMENT AND PLAN:  Diet: [name] is currently in the action stage of change. As such, her goal is to continue with weight loss efforts and has agreed to keeping a food journal and adhering to recommended goals of 1200-1300 calories and 80 grams of protein.   Exercise:  For substantial health benefits, adults should do at least 150 minutes (2 hours and 30 minutes) a week of moderate-intensity, or 75 minutes (1 hour and 15 minutes) a week of vigorous-intensity aerobic physical activity, or an equivalent combination of moderate- and vigorous-intensity aerobic activity. Aerobic activity should be performed in episodes of at least 10 minutes, and preferably, it should be spread throughout the week.  Behavior Modification:  We discussed the following Behavioral  Modification Strategies today: increasing lean protein intake, decreasing simple carbohydrates, increasing vegetables, increase H2O intake, increase high fiber foods, no skipping meals, meal planning and cooking strategies, avoiding temptations, planning for success, and keep a strict food journal. We discussed various medication options to help Maebry with her weight loss efforts and we both agreed to continue to work on nutritional and behavioral strategies to promote weight loss.  She prefers to continue to work on her nutrition and exercise at this point and is not interested in trying any medications to promote weight loss.  Return in about 4 weeks (around 02/14/2024) for Fasting Lab, Fasting IC.Marland Kitchen She was informed of the importance of frequent follow up visits to maximize her success with intensive lifestyle modifications for her multiple health conditions. Advised patient to come in 30 minutes prior to scheduled appointment to repeat fasting IC testing.   Attestation Statements:   Reviewed by clinician on day of visit: allergies, medications, problem list, medical history, surgical history, family history, social history, and previous encounter notes.   Time spent on visit including pre-visit chart review and post-visit care and charting was 32 minutes  Walker Sitar,PA-C

## 2024-01-19 ENCOUNTER — Ambulatory Visit
Admission: RE | Admit: 2024-01-19 | Discharge: 2024-01-19 | Disposition: A | Discharge status: Home / Self Care | Payer: 59 | Source: Ambulatory Visit | Attending: Adult Health | Admitting: Adult Health

## 2024-01-19 DIAGNOSIS — Z0000 Encounter for general adult medical examination without abnormal findings: Secondary | ICD-10-CM

## 2024-02-05 ENCOUNTER — Other Ambulatory Visit: Payer: Self-pay | Admitting: Family Medicine

## 2024-02-05 DIAGNOSIS — E039 Hypothyroidism, unspecified: Secondary | ICD-10-CM

## 2024-02-19 NOTE — Progress Notes (Unsigned)
 SUBJECTIVE: Discussed the use of AI scribe software for clinical note transcription with the patient, who gave verbal consent to proceed.  Chief Complaint: Obesity  Interim History: She is up 2 lbs since her last visit.   Alisha Chen is here to discuss her progress with her obesity treatment plan. She is on the keeping a food journal and adhering to recommended goals of 1200 calories and 80 protein and states she is following her eating plan approximately 50 % of the time. She states she is not exercising.  Alisha Chen is a 58 year old female who presents for follow-up of her obesity treatment plan.  She is making dietary changes by incorporating more fruits and reducing fried food intake to about twice since her last visit. She is on a 1200 calorie diet with 80 grams of protein but finds adherence challenging due to her busy schedule. She has gained 10 pounds since January 2020 when she started with [facility], now weighing 208 pounds. She acknowledges not engaging in regular exercise, which she believes is crucial for her progress.  She has a history of insulin resistance and is due for fasting labs today to recheck her A1c and insulin levels to assess current glucose control and insulin resistance status.  She has a history of hypothyroidism.   Her social history includes a demanding work schedule and caring for her special needs son, which impacts her ability to consistently follow her dietary plan and exercise regimen. She wants to see changes in her weight and overall health, having been in 'fighting mode for so long.' Repeat IC today: Repeated IC done today. REE of 1339 which is lower than predicted REE of 1596 by 257 calories and lower than previous REE done 08/04/2020 by 80 calories.  Fasting labs obtained today.  The patient was informed we would discuss the lab results at the next visit unless there is a critical issue that needs to be addressed sooner. The patient agreed to keep the next  visit at the agreed upon time to discuss these results.   OBJECTIVE: Visit Diagnoses: Problem List Items Addressed This Visit     Insulin resistance   Relevant Orders   Hemoglobin A1c   CMP14+EGFR   Insulin, random   Vitamin D deficiency   Relevant Orders   VITAMIN D 25 Hydroxy (Vit-D Deficiency, Fractures)   Other hyperlipidemia   Relevant Orders   Lipid Panel With LDL/HDL Ratio   Class 1 obesity with serious comorbidity and body mass index (BMI) of 32.0 to 32.9 in adult   BMI 33.0-33.9,adult Current BMI 33.0   Other Visit Diagnoses       SOB (shortness of breath) on exertion    -  Primary   Relevant Orders   Vitamin B12     Hypercalcemia         SOBOE Repeated IC done today. REE of 1339 which is lower than predicted REE of 1596 by 257 calories and lower than previous REE done 08/04/2020 by 80 calories. She has been struggling to adhere to her program and journaling has not been feasible recently. Plan: Since she is struggling to journal at this point, we discussed changing to a category plan and will start with a Cat. 2 plan which should be around 1200 calories/85 grams of protein daily.  We discussed trying to increase her adherence to her program with improved protein intake and fiber intake.    Obesity Struggling with weight management since January 2020, with weight increase  from 198 lbs to 208 lbs. Currently on a 1200 calorie and 80 grams of protein diet. Reports making better dietary choices but unable to exercise due to a busy schedule. Exercise is emphasized as vital for weight management, especially approaching her sixties, due to metabolic changes. A more structured meal plan is suggested to help manage her busy schedule and improve adherence, including high-protein options to enhance satiety and energy expenditure. - Perform metabolism test to assess current calorie needs- done following today's visit- See above.  - Provide a structured meal plan focusing on - Change  to Category 2 plan ~ 1200 calories/85 grams of protein  - Encourage regular exercise - Reassess weight and dietary adherence at next visit  Insulin Resistance Insulin resistance status will be assessed through laboratory tests to determine persistence. She is not on medication for insulin resistance.  Lab Results  Component Value Date   HGBA1C 5.0 07/04/2023   HGBA1C 4.8 07/14/2021   HGBA1C 5.0 11/10/2020   Lab Results  Component Value Date   LDLCALC 148 (H) 07/04/2023   CREATININE 0.90 07/04/2023   INSULIN  Date Value Ref Range Status  07/04/2023 15.8 2.6 - 24.9 uIU/mL Final  ]Continue working on nutrition plan to decrease simple carbohydrates, increase lean proteins and exercise to promote weight loss, improve glycemic control and prevent progression to Type 2 diabetes.  ]- Order fasting insulin level - Order HbA1c test/ CMET today.   Hyperlipidemia Cholesterol levels will be reassessed to evaluate the impact of dietary changes, particularly the reduction of fried foods. She is not on statin therapy.  Lab Results  Component Value Date   CHOL 212 (H) 07/04/2023   CHOL 204 (H) 07/14/2021   CHOL 197 11/10/2020   Lab Results  Component Value Date   HDL 51 07/04/2023   HDL 51 07/14/2021   HDL 53 11/10/2020   Lab Results  Component Value Date   LDLCALC 148 (H) 07/04/2023   LDLCALC 145 (H) 07/14/2021   LDLCALC 135 (H) 11/10/2020   Lab Results  Component Value Date   TRIG 73 07/04/2023   TRIG 45 07/14/2021   TRIG 50 11/10/2020   Lab Results  Component Value Date   CHOLHDL 4.2 07/04/2023   CHOLHDL 4.0 10/21/2019   CHOLHDL 3 08/14/2014   No results found for: "LDLDIRECT" The 10-year ASCVD risk score (Arnett DK, et al., 2019) is: 3.1%  Continue to work on Engineer, technical sales -decreasing simple carbohydrates, increasing lean proteins, decreasing saturated fats and cholesterol , avoiding trans fats and exercise as able to promote weight loss, improve lipids and decrease  cardiovascular risks. - Order fasting lipid panel  Vitamin D Deficiency Vitamin D is at goal of 50.  Most recent vitamin D level was 55.9. She is on OTC vitamin D3 1000 IU daily. No N/V or muscle weakness with vitamin D.  Lab Results  Component Value Date   VD25OH 55.9 07/04/2023   VD25OH 57.5 07/14/2021   VD25OH 53.6 11/10/2020    Plan: Continue OTC vitamin D3 1000 IU daily Low vitamin D levels can be associated with adiposity and may result in leptin resistance and weight gain. Also associated with fatigue.  Currently on vitamin D supplementation without any adverse effects such as nausea, vomiting or muscle weakness.  Recheck vitamin D level today.   History of elevated calcium levels Previously had elevated calcium levels, raising concerns for primary hyperparathyroidism. With correction of vitamin D levels, calcium levels have returned to normal. Calcium levels will be rechecked  to ensure stability. - Order serum calcium level with CMET today - Order vitamin D level  General Health Maintenance Guidance provided on high-protein breakfast options, emphasizing the importance of protein for satiety and energy expenditure. - Encourage high-protein breakfast options such as protein pancakes or waffles - Educate on the importance of protein for satiety and energy expenditure  Follow-up Follow-up labs and a breathing test are scheduled. Lab results will be discussed at the next visit unless there are significant abnormalities. - Schedule follow-up appointment on March 20, 2024, at 12:30 PM - Perform breathing test - Review lab results at next visit - Contact if lab results are significantly abnormal  Vitals Temp: 97.8 F (36.6 C) BP: 111/74 Pulse Rate: 72 SpO2: 100 %   Anthropometric Measurements Height: 5\' 6"  (1.676 m) Weight: 208 lb (94.3 kg) BMI (Calculated): 33.59 Weight at Last Visit: 206 lb Weight Lost Since Last Visit: 0 Weight Gained Since Last Visit: 2  lb Starting Weight: 201 lb Total Weight Loss (lbs): 0 lb (0 kg) Peak Weight: 208 lb   Body Composition  Body Fat %: 38.7 % Fat Mass (lbs): 80.8 lbs Muscle Mass (lbs): 121.4 lbs Total Body Water (lbs): 78 lbs Visceral Fat Rating : 11   Other Clinical Data RMR: [id number] Fasting: yes Labs: yes Today's Visit #: 80 Starting Date: 12/10/18     ASSESSMENT AND PLAN:  Diet: Alisha Chen is currently in the action stage of change. As such, her goal is to continue with weight loss efforts. She has agreed to Category 2 Plan.  Exercise: Alisha Chen has been instructed to try a geriatric exercise plan and that some exercise is better than none for weight loss and overall health benefits.   Behavior Modification:  We discussed the following Behavioral Modification Strategies today: increasing lean protein intake, decreasing simple carbohydrates, increasing vegetables, increase H2O intake, increase high fiber foods, meal planning and cooking strategies, better snacking choices, emotional eating strategies , avoiding temptations, and planning for success. We discussed various medication options to help Alisha Chen with her weight loss efforts and we both agreed to change to a category 2 nutrition plan and continue to work on nutritional and behavioral strategies to promote weight loss.  .  Return in about 4 weeks (around 03/19/2024).Marland Kitchen She was informed of the importance of frequent follow up visits to maximize her success with intensive lifestyle modifications for her multiple health conditions.  Attestation Statements:   Reviewed by clinician on day of visit: allergies, medications, problem list, medical history, surgical history, family history, social history, and previous encounter notes.   Time spent on visit including pre-visit chart review and post-visit care and charting was 35 minutes.    Wakeelah Solan, PA-C

## 2024-02-20 ENCOUNTER — Ambulatory Visit (INDEPENDENT_AMBULATORY_CARE_PROVIDER_SITE_OTHER): Payer: 59 | Admitting: Physician Assistant

## 2024-02-20 ENCOUNTER — Encounter (INDEPENDENT_AMBULATORY_CARE_PROVIDER_SITE_OTHER): Payer: Self-pay | Admitting: Physician Assistant

## 2024-02-20 VITALS — BP 111/74 | HR 72 | Temp 97.8°F | Ht 66.0 in | Wt 208.0 lb

## 2024-02-20 DIAGNOSIS — E88819 Insulin resistance, unspecified: Secondary | ICD-10-CM | POA: Diagnosis not present

## 2024-02-20 DIAGNOSIS — E7849 Other hyperlipidemia: Secondary | ICD-10-CM | POA: Diagnosis not present

## 2024-02-20 DIAGNOSIS — Z6833 Body mass index (BMI) 33.0-33.9, adult: Secondary | ICD-10-CM

## 2024-02-20 DIAGNOSIS — E66811 Obesity, class 1: Secondary | ICD-10-CM

## 2024-02-20 DIAGNOSIS — E559 Vitamin D deficiency, unspecified: Secondary | ICD-10-CM

## 2024-02-20 DIAGNOSIS — E039 Hypothyroidism, unspecified: Secondary | ICD-10-CM

## 2024-02-20 DIAGNOSIS — R0602 Shortness of breath: Secondary | ICD-10-CM | POA: Diagnosis not present

## 2024-02-21 ENCOUNTER — Encounter (INDEPENDENT_AMBULATORY_CARE_PROVIDER_SITE_OTHER): Payer: Self-pay | Admitting: Physician Assistant

## 2024-02-21 LAB — CMP14+EGFR
ALT: 19 IU/L (ref 0–32)
AST: 15 IU/L (ref 0–40)
Albumin: 4.1 g/dL (ref 3.8–4.9)
Alkaline Phosphatase: 111 IU/L (ref 44–121)
BUN/Creatinine Ratio: 19 (ref 9–23)
BUN: 15 mg/dL (ref 6–24)
Bilirubin Total: 0.2 mg/dL (ref 0.0–1.2)
CO2: 24 mmol/L (ref 20–29)
Calcium: 10.2 mg/dL (ref 8.7–10.2)
Chloride: 103 mmol/L (ref 96–106)
Creatinine, Ser: 0.81 mg/dL (ref 0.57–1.00)
Globulin, Total: 2.6 g/dL (ref 1.5–4.5)
Glucose: 80 mg/dL (ref 70–99)
Potassium: 4.4 mmol/L (ref 3.5–5.2)
Sodium: 140 mmol/L (ref 134–144)
Total Protein: 6.7 g/dL (ref 6.0–8.5)
eGFR: 84 mL/min/{1.73_m2} (ref >59)

## 2024-02-21 LAB — LIPID PANEL WITH LDL/HDL RATIO
Cholesterol, Total: 179 mg/dL (ref 100–199)
HDL: 43 mg/dL (ref >39)
LDL Chol Calc (NIH): 119 mg/dL — ABNORMAL HIGH (ref 0–99)
LDL/HDL Ratio: 2.8 ratio (ref 0.0–3.2)
Triglycerides: 90 mg/dL (ref 0–149)
VLDL Cholesterol Cal: 17 mg/dL (ref 5–40)

## 2024-02-21 LAB — HEMOGLOBIN A1C
Est. average glucose Bld gHb Est-mCnc: 94 mg/dL
Hgb A1c MFr Bld: 4.9 % (ref 4.8–5.6)

## 2024-02-21 LAB — VITAMIN D 25 HYDROXY (VIT D DEFICIENCY, FRACTURES): Vit D, 25-Hydroxy: 57.2 ng/mL (ref 30.0–100.0)

## 2024-02-21 LAB — INSULIN, RANDOM: INSULIN: 19.2 u[IU]/mL (ref 2.6–24.9)

## 2024-02-21 LAB — VITAMIN B12: Vitamin B-12: >2000 pg/mL — ABNORMAL HIGH (ref 232–1245)

## 2024-03-02 ENCOUNTER — Other Ambulatory Visit: Payer: Self-pay | Admitting: Family Medicine

## 2024-03-02 DIAGNOSIS — E039 Hypothyroidism, unspecified: Secondary | ICD-10-CM

## 2024-03-19 NOTE — Progress Notes (Signed)
 SUBJECTIVE: Discussed the use of AI scribe software for clinical note transcription with the patient, who gave verbal consent to proceed.  Chief Complaint: Obesity  Interim History: She is down 3 lbs since last visit.    Muscle mass + 4 lbs   Adipose mass - 7.2 lbs !!   Adipose down to 35.8% !!  Little is here to discuss her progress with her obesity treatment plan. She is on the Category 2 Plan and states she is following her eating plan approximately 60-70 % of the time. She states she is exercising walk aerobics with light weights 60 minutes 3 times per week. Alisha Chen is a 58 year old female who presents for follow-up of her obesity treatment plan.  She adheres to her obesity treatment plan at about 60-70% and has increased her physical activity by participating in a group exercise class that includes walk aerobics and light weights for an hour, three times a week. She feels better after exercising, despite initial reluctance.  Recent lab results indicate an increase in muscle mass by 4.4 pounds and a decrease in adipose tissue by 7.2 pounds, with an overall body fat percentage decrease to 35.8%. Her total cholesterol has improved to 179 mg/dL, triglycerides are at 90 mg/dL, and LDL cholesterol has decreased to 119 mg/dL. Her vitamin D  level is stable at 57.2 ng/mL with continued supplementation of 1000 units of vitamin D3 daily.  Her hemoglobin A1c is at 4.9%, indicating good blood sugar control, although she experiences some insulin  resistance with a recent insulin  level of 19.2, up from 15.8 previously.  She is currently taking allergy medication, Synthroid  for her thyroid , and vitamin D3 supplements. She has stopped taking B complex vitamins as her diet is rich in B vitamins.  OBJECTIVE: Visit Diagnoses: Problem List Items Addressed This Visit     Insulin  resistance - Primary   Vitamin D  deficiency   Other hyperlipidemia   Class 1 obesity with serious comorbidity and  body mass index (BMI) of 32.0 to 32.9 in adult   Other Visit Diagnoses       Hypercalcemia         Obesity Obesity management focuses on structured nutrition and increased physical activity. She adheres to a category two nutrition plan with 60-70% compliance and performs walk aerobics with light weights thrice weekly. Muscle mass increased by 4.4 pounds, and adipose tissue decreased from 8.8 to 7.2 pounds. Body fat percentage is now 35.8%, aiming for 35% or less. Visceral adipose rating is 10, targeting 12 or below. The exercise regimen, particularly with weights, contributes to these improvements. - Continue category two nutrition plan with focus on adherence - Maintain current exercise regimen of walk aerobics with light weights three times a week - Monitor body composition and adjust plan as needed - Encourage protein-rich breakfast to prevent weekend dietary lapses  Insulin  resistance Insulin  resistance persists with insulin  level at 19.2, up from 15.8. Hemoglobin A1c is 4.9, indicating good glycemic control. The goal is to reduce insulin  levels to 5 or less. Avoidance of simple carbohydrates is advised to manage insulin  resistance. The structured nutrition plan and exercise are expected to help reduce insulin  resistance over time. - Avoid simple carbohydrates such as baked goods, white bread, rice, and potatoes - Focus on high protein foods and complex carbohydrates - Monitor insulin  levels periodically  Hyperlipidemia Cholesterol levels improved with total cholesterol at 179 and triglycerides at 90. LDL cholesterol remains elevated at 119, with a goal of less  than 100. Dietary changes and increased physical activity contribute to these improvements. - Continue current dietary and exercise regimen - Monitor cholesterol levels periodically  Vitamin D  deficiency Vitamin D  levels are at 57.2 with ongoing vitamin D3 supplementation. Additional B vitamins are unnecessary due to a B12 and B  vitamin-rich diet. - Continue vitamin D3 supplementation at 1000 units daily - Discontinue B complex vitamins unless desired occasionally  Vitals Temp: 97.6 F (36.4 C) BP: 106/72 Pulse Rate: 61 SpO2: 100 %   Anthropometric Measurements Height: 5\' 6"  (1.676 m) Weight: 205 lb (93 kg) BMI (Calculated): 33.1 Weight at Last Visit: 205 lb Weight Lost Since Last Visit: 3 lb Weight Gained Since Last Visit: 0 Starting Weight: 201 lb Total Weight Loss (lbs): 0 lb (0 kg) Peak Weight: 208 lb   Body Composition  Body Fat %: 35.8 % Fat Mass (lbs): 73.6 lbs Muscle Mass (lbs): 125.4 lbs Total Body Water (lbs): 77.6 lbs Visceral Fat Rating : 10   Other Clinical Data RMR: 1339 Fasting: yes Labs: no Today's Visit #: 81 Starting Date: 12/10/18     ASSESSMENT AND PLAN:  Diet: Shawntell is currently in the action stage of change. As such, her goal is to continue with weight loss efforts. She has agreed to Category 2 Plan.  Exercise: Greydi has been instructed to work up to a goal of 150 minutes of combined cardio and strengthening exercise per week for weight loss and overall health benefits.   Behavior Modification:  We discussed the following Behavioral Modification Strategies today: increasing lean protein intake, decreasing simple carbohydrates, increasing vegetables, increase H2O intake, increase high fiber foods, meal planning and cooking strategies, avoiding temptations, and planning for success. We discussed various medication options to help Karlisha with her weight loss efforts and we both agreed to continue current treatment plan and continue to work on nutritional and behavioral strategies to promote weight loss.  .  Return in about 6 weeks (around 05/01/2024).Aaron Aas She was informed of the importance of frequent follow up visits to maximize her success with intensive lifestyle modifications for her multiple health conditions.  Attestation Statements:   Reviewed by clinician on  day of visit: allergies, medications, problem list, medical history, surgical history, family history, social history, and previous encounter notes.   Time spent on visit including pre-visit chart review and post-visit care and charting was 25 minutes.    Iran Kievit, PA-C

## 2024-03-20 ENCOUNTER — Ambulatory Visit (INDEPENDENT_AMBULATORY_CARE_PROVIDER_SITE_OTHER): Admitting: Physician Assistant

## 2024-03-20 ENCOUNTER — Encounter (INDEPENDENT_AMBULATORY_CARE_PROVIDER_SITE_OTHER): Payer: Self-pay | Admitting: Physician Assistant

## 2024-03-20 VITALS — BP 106/72 | HR 61 | Temp 97.6°F | Ht 66.0 in | Wt 205.0 lb

## 2024-03-20 DIAGNOSIS — E559 Vitamin D deficiency, unspecified: Secondary | ICD-10-CM | POA: Diagnosis not present

## 2024-03-20 DIAGNOSIS — Z6833 Body mass index (BMI) 33.0-33.9, adult: Secondary | ICD-10-CM

## 2024-03-20 DIAGNOSIS — E7849 Other hyperlipidemia: Secondary | ICD-10-CM | POA: Diagnosis not present

## 2024-03-20 DIAGNOSIS — E66811 Obesity, class 1: Secondary | ICD-10-CM

## 2024-03-20 DIAGNOSIS — E88819 Insulin resistance, unspecified: Secondary | ICD-10-CM

## 2024-04-30 ENCOUNTER — Ambulatory Visit (INDEPENDENT_AMBULATORY_CARE_PROVIDER_SITE_OTHER): Admitting: Physician Assistant

## 2024-04-30 ENCOUNTER — Encounter (INDEPENDENT_AMBULATORY_CARE_PROVIDER_SITE_OTHER): Payer: Self-pay | Admitting: Physician Assistant

## 2024-04-30 VITALS — BP 101/67 | HR 65 | Temp 97.8°F | Ht 66.0 in | Wt 205.0 lb

## 2024-04-30 DIAGNOSIS — E7849 Other hyperlipidemia: Secondary | ICD-10-CM | POA: Diagnosis not present

## 2024-04-30 DIAGNOSIS — E88819 Insulin resistance, unspecified: Secondary | ICD-10-CM | POA: Diagnosis not present

## 2024-04-30 DIAGNOSIS — E66811 Obesity, class 1: Secondary | ICD-10-CM | POA: Diagnosis not present

## 2024-04-30 DIAGNOSIS — E559 Vitamin D deficiency, unspecified: Secondary | ICD-10-CM | POA: Diagnosis not present

## 2024-04-30 DIAGNOSIS — Z6833 Body mass index (BMI) 33.0-33.9, adult: Secondary | ICD-10-CM

## 2024-04-30 NOTE — Progress Notes (Signed)
 SUBJECTIVE: Discussed the use of AI scribe software for clinical note transcription with the patient, who gave verbal consent to proceed.  Chief Complaint: Obesity  Interim History: She has maintained her weight since last visit.  BUT- Muscle mass + 0.8 lbs  Adipose mass -1.2 lbs  Alisha Chen is here to discuss her progress with her obesity treatment plan. She is on the keeping a food journal and adhering to recommended goals of 1200 calories and 80 protein and 16:8 intermittent fasting and states she is following her eating plan approximately 30 % of the time. She states she is exercising walking for 30-60 minutes 3 times per week.  Alisha Chen is a 58 year old female who presents for follow-up on her obesity treatment plan.  She maintains her exercise routine by participating in a group exercise class at her church, which includes walk aerobics. 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  Her compliance with her eating plan is about thirty percent. She journals her food intake on some days and follows a category two eating plan on others. She finds that not eating past 7:00 or 7:30 PM helps her feel better, aligning with an intermittent fasting schedule of eating between 11:00 AM and 7:00 PM. Her current diet includes Fairlife shakes or [place] [employer] yogurts for breakfast, and for lunch, she might have [place], or leftovers from a homemade dish using [employer] chicken products. She enjoys fruits like pears, apples, strawberries, and cantaloupe.  She has tried multiple medications in the past, including Saxenda , Contrave, and metformin , but did not find them helpful or experienced adverse effects, such as a metallic taste with metformin . She prefers to manage her weight without medication.  She has a history of gaining weight during menopause and has  been working on [employer] since 2020.  OBJECTIVE: Visit Diagnoses: Problem List Items Addressed This Visit     Insulin  resistance - Primary   Vitamin D  deficiency   Other hyperlipidemia   Class 1 obesity with serious comorbidity and body mass index (BMI) of 32.0 to 32.9 in adult   BMI 33.0-33.9,adult Current BMI 33.0  Obesity Alisha Chen, a 58 year old female, is managing her obesity with a focus on muscle gain and fat loss. She is approximately 30% compliant with her eating plan and practices intermittent fasting from 11 AM to 7 PM. She has previously used Saxenda , Contrave, and metformin  but prefers non-pharmacological management due to side effects and personal preference. Muscle building is emphasized for its role in increasing energy expenditure, and intermittent fasting is encouraged for its alignment with her natural rhythm and potential effectiveness. Injectable medications were discussed, but insurance coverage is unlikely without a qualifying diagnosis, with costs ranging from $349 to over $500 per month. - Continue exercise program, including group activities at church. - Maintain intermittent fasting schedule from 11 AM to 7 PM. - Consider using [employer] for meal planning and preparation. - Focus on building muscle to aid in weight loss. - Continue journaling and adhering to the eating plan as much as possible. - Consider injectable medications if desired, but note potential cost without insurance coverage.   Insulin  Resistance Last fasting insulin  was 19.2- remains elevated and slightly worse. A1c was 4.9- at goal and slightly improved. Polyphagia:No Medication(s): None Lab Results  Component Value Date  HGBA1C 4.9 02/20/2024   HGBA1C 5.0 07/04/2023   HGBA1C 4.8 07/14/2021   HGBA1C 5.0 11/10/2020   HGBA1C 5.0 05/27/2020   Lab Results  Component Value Date   INSULIN  19.2 02/20/2024   INSULIN  15.8 07/04/2023   INSULIN  12.6 07/14/2021   INSULIN   11.8 11/10/2020   INSULIN  13.5 05/27/2020    Plan:  Continue working on nutrition plan to decrease simple carbohydrates, increase lean proteins and exercise to promote weight loss, improve glycemic control and prevent progression to Type 2 diabetes.  She has tried medications in the past without success or with side effects. Metformin  caused metallic taste. Saxenda -constipation>hemorrhoids   Declined topamax 05/27/20 and Contrave 08/26/20 She would prefer to continue to work on nutrition plan and exercise to promote healthy weight loss.   Hyperlipidemia LDL is not at goal. Medication(s): no statin therapy.  Cardiovascular risk factors: dyslipidemia, obesity (BMI >= 30 kg/m2), and sedentary lifestyle  LDL still elevated, but has improved from previous 148  HDL 43- at goal level of > 40 Trig 90- at goal   Lab Results  Component Value Date   CHOL 179 02/20/2024   HDL 43 02/20/2024   LDLCALC 119 (H) 02/20/2024   TRIG 90 02/20/2024   CHOLHDL 4.2 07/04/2023   CHOLHDL 4.0 10/21/2019   CHOLHDL 3 08/14/2014   Lab Results  Component Value Date   ALT 19 02/20/2024   AST 15 02/20/2024   ALKPHOS 111 02/20/2024   BILITOT 0.2 02/20/2024   The 10-year ASCVD risk score (Arnett DK, et al., 2019) is: 2.3%   Values used to calculate the score:     Age: 40 years     Sex: Female     Is Non-Hispanic African American: Yes     Diabetic: No     Tobacco smoker: No     Systolic Blood Pressure: 101 mmHg     Is BP treated: No     HDL Cholesterol: 43 mg/dL     Total Cholesterol: 179 mg/dL  Plan: Continue to work on Engineer, technical sales -decreasing simple carbohydrates, increasing lean proteins, decreasing saturated fats and cholesterol , avoiding trans fats and exercise as able to promote weight loss, improve lipids and decrease cardiovascular risks.      Vitamin D  Deficiency Vitamin D  is at goal of 50.  Most recent vitamin D  level was 57.2. She is on OTC vitamin D3 1000 IU daily. No N/V or muscle  weakness with OTC vitamin D  Had elevated Calcium levels, now normal at 10.2 on last panel.  Lab Results  Component Value Date   VD25OH 57.2 02/20/2024   VD25OH 55.9 07/04/2023   VD25OH 57.5 07/14/2021    Plan: Continue OTC vitamin D3 1000 IU daily Low vitamin D  levels can be associated with adiposity and may result in leptin resistance and weight gain. Also associated with fatigue.  Currently on vitamin D  supplementation without any adverse effects such as nausea, vomiting or muscle weakness.   Follow-up Scheduled for follow-up to monitor progress and adjust treatment plan as needed. - Follow-up appointment on July 29th at 4 PM.  Vitals Temp: 97.8 F (36.6 C) BP: 101/67 Pulse Rate: 65 SpO2: 99 %   Anthropometric Measurements Height: 5\' 6"  (1.676 m) Weight: 205 lb (93 kg) BMI (Calculated): 33.1 Weight at Last Visit: 205 lb Weight Lost Since Last Visit: 0 Weight Gained Since Last Visit: 0 Starting Weight: 201 lb Total Weight Loss (lbs): 0 lb (0 kg) Peak Weight: 208 lb   Body Composition  Body Fat %: 35.4 % Fat Mass (lbs): 72.8 lbs Muscle Mass (lbs): 126.2 lbs Total Body Water (lbs): 76.4 lbs Visceral Fat Rating : 10   Other Clinical Data RMR: 1339 Fasting: yes Labs: no Today's Visit #: 24 Starting Date: 12/10/18     ASSESSMENT AND PLAN:  Diet: Arisha is currently in the action stage of change. As such, her goal is to continue with weight loss efforts. She has agreed to Category 2 Plan and keeping a food journal and adhering to recommended goals of 1200-1300 calories and 85 grams of protein+ 16:8 intermittent fasting.  Exercise: Melitza has been instructed to work up to a goal of 150 minutes of combined cardio and strengthening exercise per week for weight loss and overall health benefits.   Behavior Modification:  We discussed the following Behavioral Modification Strategies today: increasing lean protein intake, decreasing simple carbohydrates, increasing  vegetables, increase H2O intake, increase high fiber foods, no skipping meals, meal planning and cooking strategies, avoiding temptations, and planning for success. We discussed various medication options to help Cortlyn with her weight loss efforts and we both agreed to continue current treatment plan, continue to work on nutritional and behavioral strategies to promote weight loss.  .  No follow-ups on file.Aaron Aas She was informed of the importance of frequent follow up visits to maximize her success with intensive lifestyle modifications for her multiple health conditions.  Attestation Statements:   Reviewed by clinician on day of visit: allergies, medications, problem list, medical history, surgical history, family history, social history, and previous encounter notes.   Time spent on visit including pre-visit chart review and post-visit care and charting was 28 minutes.    Tanvir Hipple, PA-C

## 2024-06-18 ENCOUNTER — Ambulatory Visit (INDEPENDENT_AMBULATORY_CARE_PROVIDER_SITE_OTHER): Admitting: Physician Assistant

## 2024-06-18 ENCOUNTER — Encounter (INDEPENDENT_AMBULATORY_CARE_PROVIDER_SITE_OTHER): Payer: Self-pay | Admitting: Physician Assistant

## 2024-06-18 VITALS — BP 115/73 | HR 83 | Temp 98.2°F | Ht 66.0 in | Wt 205.0 lb

## 2024-06-18 DIAGNOSIS — Z6833 Body mass index (BMI) 33.0-33.9, adult: Secondary | ICD-10-CM

## 2024-06-18 DIAGNOSIS — E88819 Insulin resistance, unspecified: Secondary | ICD-10-CM | POA: Diagnosis not present

## 2024-06-18 DIAGNOSIS — E7849 Other hyperlipidemia: Secondary | ICD-10-CM | POA: Diagnosis not present

## 2024-06-18 DIAGNOSIS — E66811 Obesity, class 1: Secondary | ICD-10-CM | POA: Diagnosis not present

## 2024-06-18 DIAGNOSIS — E559 Vitamin D deficiency, unspecified: Secondary | ICD-10-CM | POA: Diagnosis not present

## 2024-06-18 NOTE — Progress Notes (Signed)
 SUBJECTIVE: Discussed the use of AI scribe software for clinical note transcription with the patient, who gave verbal consent to proceed.  Chief Complaint: Obesity  Interim History: She has maintained her weight since last visit.    Shandon is here to discuss her progress with her obesity treatment plan. She is on the keeping a food journal and adhering to recommended goals of 1200 calories and 80 protein and states she is following her eating plan approximately 50 % of the time. She states she is exercising 30-90 minutes 1 times per week. BRITTIANY WIEHE is a 58 year old female who presents for follow-up of her obesity treatment plan.  She has a history of insulin  resistance, hyperlipidemia, and vitamin D  deficiency. 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 Despite this, her weight has remained stable, with fluctuations where it tends to increase but then returns to baseline. Her current weight is 205 pounds, with a body adipose percentage of 35.7% and a visceral adipose rating of 10.  She has previously tried metformin  for her insulin  resistance, which caused a metallic taste, and Saxenda , which resulted in severe constipation. She is currently not on any medication for weight loss due to these side effects.  Her current dietary regimen includes packing her lunch with a sandwich, a 200-calorie snack, and fruit. She is mindful of her calorie intake, aiming for 1200 calories per day, sometimes exceeding by 100-200 calories. She uses MyFitnessPal to track her intake and consumes Metamucil for fiber, which helps with hunger management. Her breakfast typically includes Fairlife fat-free milk and cereal, and dinner varies between a protein shake or a dish with ground beef.  She has been trying to stop eating around 7-8 PM and is considering increasing her physical activity by incorporating a weighted vest during walks.  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 No current cravings.   Pharmacotherapy: She has tried multiple medications in the past, including Saxenda , Contrave, and metformin , but did not find them helpful or experienced adverse effects, such as a metallic taste with metformin . She prefers to manage her weight without medication.   IC done last visit: REE 1339 down from previous IC in 2021 of 1419 .  OBJECTIVE: Visit Diagnoses: Problem List Items Addressed This Visit     Insulin  resistance - Primary   Vitamin D  deficiency   Other hyperlipidemia   Class 1 obesity with serious comorbidity and body mass index (BMI) of 32.0 to 32.9 in adult   BMI 33.0-33.9,adult Current BMI 33.0  Obesity Obesity management is ongoing. Current weight is 205 lbs with a body adipose percentage of 35.7%, close to the goal of 35% or less. Visceral adipose rating is 10, with a goal of 12 or less. BMI is high, but lean mass is maintained.  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 Previous attempts with medications like Saxenda  and metformin  were not well tolerated due to side effects such as constipation and metallic taste.  She prefers a natural approach to weight loss and is committed to dietary changes and exercise. - Reduce daily caloric intake to 1000-1100 calories with 80 grams of protein for one month based on IC done after last month's visit of 1339. We discussed strategies to increase metabolism with increased activity and strength training.  - Encourage continued meal  planning and packing lunches. - Increase physical activity, including strength training and possibly using a weighted vest for walking. - Reassess weight and adipose percentage at next visit.  Insulin  resistance Insulin  resistance is present. Previous use of metformin  was not well tolerated due to a metallic taste. She is not currently on medication for insulin   resistance and prefers to manage it through lifestyle modifications. Lab Results  Component Value Date   HGBA1C 4.9 02/20/2024   HGBA1C 5.0 07/04/2023   HGBA1C 4.8 07/14/2021   Lab Results  Component Value Date   LDLCALC 119 (H) 02/20/2024   CREATININE 0.81 02/20/2024   INSULIN   Date Value Ref Range Status  02/20/2024 19.2 2.6 - 24.9 uIU/mL Final  ] A1c excellent and at goal. Insulin  resistance which is slightly worse and not at goal.  Wants to work on nutrition plan and exercise rather than add medication at this point.  Continue working on nutrition plan to decrease simple carbohydrates, increase lean proteins and exercise to promote weight loss, improve glycemic control and prevent progression to Type 2 diabetes.  - Manage insulin  resistance through lifestyle changes, including diet and exercise.   Hyperlipidemia LDL is not at goal. Medication(s): No statin or other medication Cardiovascular risk factors: dyslipidemia, obesity (BMI >= 30 kg/m2), and sedentary lifestyle  Lab Results  Component Value Date   CHOL 179 02/20/2024   HDL 43 02/20/2024   LDLCALC 119 (H) 02/20/2024   TRIG 90 02/20/2024   CHOLHDL 4.2 07/04/2023   CHOLHDL 4.0 10/21/2019   CHOLHDL 3 08/14/2014   Lab Results  Component Value Date   ALT 19 02/20/2024   AST 15 02/20/2024   ALKPHOS 111 02/20/2024   BILITOT 0.2 02/20/2024   The 10-year ASCVD risk score (Arnett DK, et al., 2019) is: 3.7%   Values used to calculate the score:     Age: 52 years     Clincally relevant sex: Female     Is Non-Hispanic African American: Yes     Diabetic: No     Tobacco smoker: No     Systolic Blood Pressure: 115 mmHg     Is BP treated: No     HDL Cholesterol: 43 mg/dL     Total Cholesterol: 179 mg/dL  Plan: Statin therapy not indicated at this time.  Continue to work on nutrition plan -decreasing simple carbohydrates, increasing lean proteins, decreasing saturated fats and cholesterol , avoiding trans fats and  exercise as able to promote weight loss, improve lipids and decrease cardiovascular risks.  Vitamin D  Deficiency Vitamin D  is at goal of 50.  Most recent vitamin D  level was 57.2. She is on OTC vitamin D3 1000 IU daily. No N/V or muscle weakness with OTC vitamin D .  Lab Results  Component Value Date   VD25OH 57.2 02/20/2024   VD25OH 55.9 07/04/2023   VD25OH 57.5 07/14/2021    Plan: Continue OTC vitamin D3 1000 IU daily Low vitamin D  levels can be associated with adiposity and may result in leptin resistance and weight gain. Also associated with fatigue.  Currently on vitamin D  supplementation without any adverse effects such as nausea, vomiting or muscle weakness.     Vitals Temp: 98.2 F (36.8 C) BP: 115/73 Pulse Rate: 83 SpO2: 96 %   Anthropometric Measurements Height: 5' 6 (1.676 m) Weight: 205 lb (93 kg) BMI (Calculated): 33.1 Weight at Last Visit: 205 lb Weight Lost Since Last Visit: 0 Weight Gained Since Last Visit: 0 Starting Weight: 201 lb Total Weight  Loss (lbs): 0 lb (0 kg) Peak Weight: 208 lb   Body Composition  Body Fat %: 35.7 % Fat Mass (lbs): 73.4 lbs Muscle Mass (lbs): 125.8 lbs Total Body Water (lbs): 78 lbs Visceral Fat Rating : 10   Other Clinical Data Fasting: No Labs: No Today's Visit #: 77 Starting Date: 12/10/18     ASSESSMENT AND PLAN:  Diet: [name] is currently in the action stage of change. As such, her goal is to continue with weight loss efforts. She has agreed to keeping a food journal and adhering to recommended goals of 1000-1100 calories and 80 grams of protein.  Exercise: Austina has been instructed to work up to a goal of 150 minutes of combined cardio and strengthening exercise per week and consider use of weighted vest to work on strengthening while walking for weight loss and overall health benefits.   Behavior Modification:  We discussed the following Behavioral Modification Strategies today: increasing lean protein  intake, decreasing simple carbohydrates, increasing vegetables, increase H2O intake, increase high fiber foods, meal planning and cooking strategies, avoiding temptations, planning for success, and keep a strict food journal. We discussed various medication options to help Nancey with her weight loss efforts and we both agreed to decrease calories to 1000-1100 with 80 grams of protein daily based on most recent IC in June 2025.  Return in about 4 weeks (around 07/16/2024).[name] She was informed of the importance of frequent follow up visits to maximize her success with intensive lifestyle modifications for her multiple health conditions.  Attestation Statements:   Reviewed by clinician on day of visit: allergies, medications, problem list, medical history, surgical history, family history, social history, and previous encounter notes.   Time spent on visit including pre-visit chart review and post-visit care and charting was 42 minutes.    Lebaron Bautch, PA-C

## 2024-07-12 ENCOUNTER — Ambulatory Visit: Admitting: Family Medicine

## 2024-07-17 ENCOUNTER — Ambulatory Visit (INDEPENDENT_AMBULATORY_CARE_PROVIDER_SITE_OTHER): Admitting: Physician Assistant

## 2024-07-17 ENCOUNTER — Encounter (INDEPENDENT_AMBULATORY_CARE_PROVIDER_SITE_OTHER): Payer: Self-pay | Admitting: Physician Assistant

## 2024-07-17 VITALS — BP 113/75 | HR 93 | Temp 98.6°F | Ht 66.0 in | Wt 201.0 lb

## 2024-07-17 DIAGNOSIS — E88819 Insulin resistance, unspecified: Secondary | ICD-10-CM | POA: Diagnosis not present

## 2024-07-17 DIAGNOSIS — Z6832 Body mass index (BMI) 32.0-32.9, adult: Secondary | ICD-10-CM

## 2024-07-17 DIAGNOSIS — E7849 Other hyperlipidemia: Secondary | ICD-10-CM | POA: Diagnosis not present

## 2024-07-17 DIAGNOSIS — E66811 Obesity, class 1: Secondary | ICD-10-CM | POA: Diagnosis not present

## 2024-07-17 DIAGNOSIS — E559 Vitamin D deficiency, unspecified: Secondary | ICD-10-CM | POA: Diagnosis not present

## 2024-07-17 DIAGNOSIS — E039 Hypothyroidism, unspecified: Secondary | ICD-10-CM

## 2024-07-17 NOTE — Progress Notes (Unsigned)
 SUBJECTIVE: Discussed the use of AI scribe software for clinical note transcription with the patient, who gave verbal consent to proceed.  Chief Complaint: Obesity  Interim History: She is down 4 lbs since her last visit Muscle mass + 2.6 lbs Adipose mass -7.6 lbs  Kenlei is here to discuss her progress with her obesity treatment plan. She is on the Category 1 Plan and states she is following her eating plan approximately 70 % of the time. She states she is exercising 30-60 minutes 5 times per week.  ABAGALE BOULOS is a 58 year old female who presents for follow-up of her obesity treatment plan.  She has lost four pounds and gained almost three pounds of muscle mass. Her body fat percentage and visceral adipose rating have decreased, with her current visceral adipose rating at nine.  She works at [employer], which requires frequent stair climbing, contributing to her increased physical activity. 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 She has adjusted to a new routine, which includes early mornings to take her son, who has special needs, to a sitter before work.  She experiences challenges with sleep, going to bed around 9 PM on good nights and 11 PM on bad nights, but falls asleep quickly once in bed. Her eating habits have improved as she eats when the children eat and tries to pack her meals. She focuses on meal planning and prepping, as she cannot leave to buy food during work hours.  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 For dinners, she typically prepares meals like ground beef with salad or cheese, opting for leaner options such as 93% lean ground beef. OBJECTIVE: Visit Diagnoses: Problem List Items Addressed This Visit       Endocrine   Insulin  resistance - Primary   Hypothyroidism     Other   Vitamin D  deficiency   Other hyperlipidemia   Class 1 obesity with serious comorbidity and body mass index  (BMI) of 32.0 to 32.9 in adult   Other Visit Diagnoses       Hypercalcemia          Obesity Obesity management is progressing with improvements over the past month- with weight reduction and increased muscle mass.  Body fat percentage is below target of < 35%, and visceral adipose rating is 9, with a goal of 12 or less.  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  Challenges include maintaining adequate sleep and hydration. - Continue current exercise regimen and increase physical activity as possible. - Maintain meal planning and prepping to ensure healthy eating habits. - Focus on improving sleep schedule, aiming for a consistent bedtime around 9 PM. - Increase water intake throughout the day.  Insulin  Resistance Last fasting insulin  was 19.2- not at goal. A1c was 4.9- at goal. Polyphagia:No- not when eating on plan Prefers to focus on nutrition and exercise and avoid medications.  Medication(s): None Lab Results  Component Value Date   HGBA1C 4.9 02/20/2024   HGBA1C 5.0 07/04/2023   HGBA1C 4.8 07/14/2021   HGBA1C 5.0 11/10/2020   HGBA1C 5.0 05/27/2020   Lab Results  Component Value Date   INSULIN  19.2 02/20/2024   INSULIN  15.8 07/04/2023   INSULIN  12.6 07/14/2021   INSULIN  11.8 11/10/2020   INSULIN  13.5 05/27/2020    Plan:  Continue working on nutrition plan to decrease  simple carbohydrates, increase lean proteins and exercise to promote weight loss, improve glycemic control and prevent progression to Type 2 diabetes.    Hyperlipidemia LDL is not at goal. Medication(s): not on statin therapy, Cardiovascular risk factors: dyslipidemia and obesity (BMI >= 30 kg/m2)  Lab Results  Component Value Date   CHOL 179 02/20/2024   HDL 43 02/20/2024   LDLCALC 119 (H) 02/20/2024   TRIG 90 02/20/2024   CHOLHDL 4.2 07/04/2023   CHOLHDL 4.0 10/21/2019   CHOLHDL 3 08/14/2014   Lab Results   Component Value Date   ALT 19 02/20/2024   AST 15 02/20/2024   ALKPHOS 111 02/20/2024   BILITOT 0.2 02/20/2024   The 10-year ASCVD risk score (Arnett DK, et al., 2019) is: 3.5%   Values used to calculate the score:     Age: 57 years     Clincally relevant sex: Female     Is Non-Hispanic African American: Yes     Diabetic: No     Tobacco smoker: No     Systolic Blood Pressure: 113 mmHg     Is BP treated: No     HDL Cholesterol: 43 mg/dL     Total Cholesterol: 179 mg/dL  Plan: Statin not indicated currently based on ASCVD risk score.  Continue to work on nutrition plan -decreasing simple carbohydrates, increasing lean proteins, decreasing saturated fats and cholesterol , avoiding trans fats and exercise as able to promote weight loss, improve lipids and decrease cardiovascular risks. Recheck levels in 2-3 months   Vitamin D  Deficiency Vitamin D  is at goal of 50.  Most recent vitamin D  level was 57.2. She is on OTC vitamin D3 1000 IU daily. No N/V or muscle weakness with OTC vitamin D .  Lab Results  Component Value Date   VD25OH 57.2 02/20/2024   VD25OH 55.9 07/04/2023   VD25OH 57.5 07/14/2021    Plan: Continue OTC vitamin D3 1000 IU daily Low vitamin D  levels can be associated with adiposity and may result in leptin resistance and weight gain. Also associated with fatigue.  Currently on vitamin D  supplementation without any adverse effects such as nausea, vomiting or muscle weakness.  Recheck lab in 2-3 months.   Vitals Temp: 98.6 F (37 C) BP: 113/75 Pulse Rate: 93 SpO2: 97 %   Anthropometric Measurements Height: 5' 6 (1.676 m) Weight: 201 lb (91.2 kg) BMI (Calculated): 32.46 Weight at Last Visit: 205lb Weight Lost Since Last Visit: 4lb Weight Gained Since Last Visit: 0lb Starting Weight: 201lb Total Weight Loss (lbs): 0 lb (0 kg) Peak Weight: 208lb   Body Composition  Body Fat %: 32.7 % Fat Mass (lbs): 65.8 lbs Muscle Mass (lbs): 128.4 lbs Total Body  Water (lbs): 79 lbs Visceral Fat Rating : 9   Other Clinical Data Fasting: No Labs: No Today's Visit #: 73 Starting Date: 12/10/18     ASSESSMENT AND PLAN:  Diet: Yazmina is currently in the action stage of change. As such, her goal is to continue with weight loss efforts. She has agreed to Category 1 Plan.  Exercise: Rubena has been instructed to work up to a goal of 150 minutes of combined cardio and strengthening exercise per week and to continue exercising as is for weight loss and overall health benefits.   Behavior Modification:  We discussed the following Behavioral Modification Strategies today: increasing lean protein intake, decreasing simple carbohydrates, increasing vegetables, increase H2O intake, increase high fiber foods, meal planning and cooking strategies, avoiding temptations, and planning for  success. We discussed various medication options to help Kenyada with her weight loss efforts and we both agreed to continue current treatment plan.  Follow up in 4-6 weeks. She was informed of the importance of frequent follow up visits to maximize her success with intensive lifestyle modifications for her multiple health conditions.  Attestation Statements:   Reviewed by clinician on day of visit: allergies, medications, problem list, medical history, surgical history, family history, social history, and previous encounter notes.   Time spent on visit including pre-visit chart review and post-visit care and charting was 20 minutes.    Issac Moure,PA-C

## 2024-07-17 NOTE — Progress Notes (Deleted)
 SUBJECTIVE: Discussed the use of AI scribe software for clinical note transcription with the patient, who gave verbal consent to proceed.  Chief Complaint: Obesity  Interim History: ***  Alisha Chen is here to discuss her progress with her obesity treatment plan. She is on the Category 1 Plan and Category 2 Plan and states she [facility] [place] [address] [place] [address] Dept: [phone] Dept Fax: [phone]  At a Glance:  No data recorded No data recorded No data recorded No data recorded  EKG: Normal sinus rhythm, rate ***.  Indirect Calorimeter completed today shows a VO2 of *** and a REE of ***.  Her calculated basal metabolic rate is *** thus her basal metabolic rate is {DESC; BETTER/WORSE:18575} than expected.  Chief Complaint:  Obesity   Subjective:  Alisha Chen (MR# [id number]) is {MWM j/jw:789035976} 58 y.o. female who presents for evaluation and treatment of obesity and related comorbidities.   Alisha Chen is currently in the action stage of change and ready to dedicate time achieving and maintaining a healthier weight. Alisha Chen is interested in becoming our patient and working on intensive lifestyle modifications including (but not limited to) diet and exercise for weight loss.  Alisha Chen has been struggling with her weight. She has been unsuccessful in either losing weight, maintaining weight loss, or reaching her healthy weight goal.  Alisha Chen's habits were reviewed today and are as follows:  {MWM Weight Habits:210964024}  {MWM Started gaining weight (Optional):210964025} {Has dealt with weight issues.[name].(Optional):210964026}   Depression Screen Alisha Chen's Food and Mood (modified PHQ-9) score was ***.     11/07/2022   10:37 AM  Depression screen PHQ 2/9  Decreased Interest 0  Down, Depressed, Hopeless 0  PHQ - 2 Score 0  Altered sleeping 2  Tired, decreased energy 2  Change in  appetite 2  Feeling bad or failure about yourself  1  Trouble concentrating 2  Moving slowly or fidgety/restless 0  Suicidal thoughts 0  PHQ-9 Score 9     Assessment and Plan:   1. Insulin  resistance (Primary) ***  2. Other hyperlipidemia ***  3. Vitamin D  deficiency ***  4. Hypercalcemia ***  5. Hypothyroidism, unspecified type ***  6. Obesity with starting BMI of 32.4 ***   Shan {CHL AMB IS/IS NOT:210130109} currently in the action stage of change and her goal is to {MWMwtloss#1:210800005}. I recommend Alisha Chen begin the structured treatment plan as follows:  She has agreed to {HWW Weight Loss Eojw:789035994}  Exercise goals: {MWM Exercise Recommendations:210964029}  Behavioral modification strategies:{HWW Behavior Modification:210964008}  She was informed of the importance of frequent follow-up visits to maximize her success with intensive lifestyle modifications for her multiple health conditions. She was informed we would discuss her lab results at her next visit unless there is a critical issue that needs to be addressed sooner. Alisha Chen agreed to keep her next visit at the agreed upon time to discuss these results.  Objective:  General: Cooperative, alert, well developed, in no acute distress. HEENT: Conjunctivae and lids unremarkable. Cardiovascular: Regular rhythm.  Lungs: Normal work of breathing. Neurologic: No focal deficits.   Lab Results  Component Value Date   CREATININE 0.81 02/20/2024   BUN 15 02/20/2024   NA 140 02/20/2024   K 4.4 02/20/2024   CL 103 02/20/2024   CO2 24 02/20/2024   Lab Results  Component Value Date   ALT 19 02/20/2024   AST 15 02/20/2024   ALKPHOS 111  02/20/2024   BILITOT 0.2 02/20/2024   Lab Results  Component Value Date   HGBA1C 4.9 02/20/2024   HGBA1C 5.0 07/04/2023   HGBA1C 4.8 07/14/2021   HGBA1C 5.0 11/10/2020   HGBA1C 5.0 05/27/2020   Lab Results  Component Value Date   INSULIN  19.2 02/20/2024   INSULIN   15.8 07/04/2023   INSULIN  12.6 07/14/2021   INSULIN  11.8 11/10/2020   INSULIN  13.5 05/27/2020   Lab Results  Component Value Date   TSH 2.93 09/18/2023   Lab Results  Component Value Date   CHOL 179 02/20/2024   HDL 43 02/20/2024   LDLCALC 119 (H) 02/20/2024   TRIG 90 02/20/2024   CHOLHDL 4.2 07/04/2023   Lab Results  Component Value Date   WBC 4.3 05/27/2020   HGB 12.2 05/27/2020   HCT 38.8 05/27/2020   MCV 89 05/27/2020   PLT 227 05/27/2020   No results found for: IRON, TIBC, FERRITIN  Attestation Statements:  Reviewed by clinician on day of visit: allergies, medications, problem list, medical history, surgical history, family history, social history, and previous encounter notes  Established Patient Office Visit  Subjective   Patient ID: Alisha Chen, female    DOB: 12/22/1965  Age: 58 y.o. MRN: [id number]  No chief complaint on file.   HPI  {History (Optional):23778}  ROS    Objective:     LMP 02/27/2012  {Vitals History (Optional):23777}  Physical Exam   No results found for any visits on 07/17/24.  {Labs (Optional):23779}  The 10-year ASCVD risk score (Arnett DK, et al., 2019) is: 3.7%    Assessment & Plan:   Problem List Items Addressed This Visit       Endocrine   Insulin  resistance - Primary   Hypothyroidism     Other   Vitamin D  deficiency   Other hyperlipidemia   Class 1 obesity with serious comorbidity and body mass index (BMI) of 32.0 to 32.9 in adult   Other Visit Diagnoses       Hypercalcemia           No follow-ups on file.    Alisha Chen, CMA                                                                                                              WEIGHT SUMMARY AND BIOMETRICS  No data recorded No data recorded  No data recorded No data recorded No data recorded No data recorded  OBESITY Alisha Chen is here to discuss her progress with her obesity treatment plan along with follow-up of  her obesity related diagnoses.    Nutrition Plan: {dwwsldiets:29085} - ***% adherence.  Current exercise: {exercise types:16438}  Interim History:  *** {aabnutritionassessment:29213}   Pharmacotherapy: Alisha Chen is on {dwwpharmacotherapy:29109} Adverse side effects: {dwwse:29122} Hunger is {EWCONTROLASSESSMENT:24261}.  Cravings are {EWCONTROLASSESSMENT:24261}.  Assessment/Plan:   1. Insulin  resistance ***  2. Other hyperlipidemia ***  3. Vitamin D  deficiency ***  4. Hypercalcemia ***  5. Hypothyroidism, unspecified type ***  6. Obesity with starting BMI of 32.4 ***     {  dwwmorbid:29108::Morbid Obesity}: Current BMI No data recorded  Pharmacotherapy Plan {dwwmed:29123}  {dwwpharmacotherapy:29109}  Alisha Chen {CHL AMB IS/IS NOT:210130109} currently in the action stage of change. As such, her goal is to {MWMwtloss#1:210800005}.  She has agreed to {dwwsldiets:29085}.  Exercise goals: {MWM EXERCISE RECS:23473}  Behavioral modification strategies: {dwwslwtlossstrategies:29088}.  Alisha Chen has agreed to follow-up with our clinic in {NUMBER 1-10:22536} weeks.   No orders of the defined types were placed in this encounter.   There are no discontinued medications.   No orders of the defined types were placed in this encounter.     Objective:   VITALS: Per patient if applicable, see vitals. GENERAL: Alert and in no acute distress. CARDIOPULMONARY: No increased WOB. Speaking in clear sentences.  PSYCH: Pleasant and cooperative. Speech normal rate and rhythm. Affect is appropriate. Insight and judgement are appropriate. Attention is focused, linear, and appropriate.  NEURO: Oriented as arrived to appointment on time with no prompting.   Attestation Statements:   This was prepared with the assistance of Engineer, civil (consulting).  Occasional wrong-word or sound-a-like substitutions may have occurred due to the inherent limitations of voice recognition  .  Time spent on  visit including pre-visit chart review and post-visit charting and care was *** minutes.   Alisha Chen, CMA    following her eating plan approximately *** % of the time. She states she {CHL AMB IS/IS NOT:210130109} exercising *** minutes *** times per week.   OBJECTIVE: Visit Diagnoses: Problem List Items Addressed This Visit       Endocrine   Insulin  resistance - Primary   Hypothyroidism     Other   Vitamin D  deficiency   Other hyperlipidemia   Class 1 obesity with serious comorbidity and body mass index (BMI) of 32.0 to 32.9 in adult   Other Visit Diagnoses       Hypercalcemia           No data recorded No data recorded No data recorded No data recorded   ASSESSMENT AND PLAN:  Diet: Alaiya {CHL AMB IS/IS NOT:210130109} currently in the action stage of change. As such, her goal is to {HWW Weight Loss Efforts:210964006}. She {HAS HAS WNU:81165} agreed to {HWW Weight Loss Plan:210964005}.  Exercise: Linnell has been instructed {HWW Exercise:210964007} for weight loss and overall health benefits.   Behavior Modification:  We discussed the following Behavioral Modification Strategies today: {HWW Behavior Modification:210964008}. We discussed various medication options to help Suriah with her weight loss efforts and we both agreed to ***.  No follow-ups on file.[name] She was informed of the importance of frequent follow up visits to maximize her success with intensive lifestyle modifications for her multiple health conditions.  Attestation Statements:   Reviewed by clinician on day of visit: allergies, medications, problem list, medical history, surgical history, family history, social history, and previous encounter notes.   Time spent on visit including pre-visit chart review and post-visit care and charting was *** minutes.    Alisha Chen, CMA

## 2024-07-19 ENCOUNTER — Ambulatory Visit (INDEPENDENT_AMBULATORY_CARE_PROVIDER_SITE_OTHER): Admitting: Family Medicine

## 2024-07-19 VITALS — BP 114/80 | HR 84 | Temp 98.4°F | Resp 18 | Ht 66.0 in | Wt 206.8 lb

## 2024-07-19 DIAGNOSIS — R0989 Other specified symptoms and signs involving the circulatory and respiratory systems: Secondary | ICD-10-CM | POA: Diagnosis not present

## 2024-07-19 DIAGNOSIS — R600 Localized edema: Secondary | ICD-10-CM

## 2024-07-19 LAB — POC COVID19 BINAXNOW: SARS Coronavirus 2 Ag: NEGATIVE

## 2024-07-19 MED ORDER — AZITHROMYCIN 250 MG PO TABS: ORAL_TABLET | ORAL | 0 refills | Status: DC

## 2024-07-19 NOTE — Assessment & Plan Note (Signed)
 Keep elevated  Compression socks  Return to office as needed

## 2024-07-19 NOTE — Assessment & Plan Note (Signed)
 Take xyzal  and flonase  Take mucinex for cough If no better in a few days can take z pak

## 2024-07-19 NOTE — Progress Notes (Signed)
 q  Subjective:    Patient ID: Alisha Chen, female    DOB: [birth year], 58 y.o.   MRN: [id number]  Chief Complaint  Patient presents with   Joint Swelling    Both ankles, more left, swelling has come down some.    Sinus Problem    Pt states sxs started yesterday. Some cough, seeing some green mucus.     HPI Patient is in today for congestion x 1 day.   No fever.  Discussed the use of AI scribe software for clinical note transcription with the patient, who gave verbal consent to proceed.  History of Present Illness Alisha Chen is a 58 year old female who presents with swelling in her left ankle and chest congestion.  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  She experiences chest congestion with phlegm that began yesterday. The sensation is mild and differs from her usual upper respiratory infections, which are typically more intense. She has no fever but has a scratchy throat that started yesterday, causing concern it may worsen. She has taken Tylenol  twice and tried drinking hot tea for relief.  She takes Tylenol  and uses Flonase  nasal spray to manage her symptoms. She is cautious about fluid buildup in her ears, especially when flying, as she plans to visit [place]  in November.  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,dcis   History of chemotherapy 03/23/12- 05/10/12   s/p 4 cycles of FEC    Hx: UTI (urinary tract infection)    Knee pain    Lactose intolerance    Neck pain    S/P radiation therapy 07/10/12-08/23/12   Left Breast: 50  Gy/25 Fractions; Boost: 10 Gy/5 Fractions   Status post chemotherapy 1 dose 05/23/12   Stopped after one cycle of Taxol  due to Grade 2 Toxicity   Status post chemotherapy 1 dose 05/23/12   Stopped after one cycle of Taxol  due to Grade 2 Toxicity    Past Surgical History:  Procedure Laterality Date   AXILLARY SURGERY  03/06/12   left, regional resection, 1/3 nodes pos   BACK SURGERY     LUMBAR DECOMPRESSION   BREAST BIOPSY  01/09/2012   left breast 3 0'clock/ER?PR =positive,her 2 neg   BREAST LUMPECTOMY Left 02/10/12   Left Breast: 2 Foci of Invasive Ductal Caand High grade Ductal Carcinoma In Situ, 0/14 nodes Left Axilla Negative : Regional Resection of Lymph Nodes: 0/5 Nodes Negative   FOOT SURGERY     multiple   INTRAUTERINE DEVICE REMOVAL  01/24/12   needle core Biospy  01/09/12   Left Breast: Invasive Ductal Carcinoma, Lymph Node Axilla: Metastatic Mammary Carcinoma   PORTACATH PLACEMENT  02/10/2012   Procedure: INSERTION PORT-A-CATH;  Surgeon: Donnice Bury, MD;  Location: [facility];  Service: General;  Laterality: Right;   Regional Resection  03/06/12   Left Axilla: 1/3 Nodes Metastatic Carcinoma   SPINE SURGERY  01/2011    Family History  Problem Relation Age of Onset   Diabetes Mother  Sudden death Mother    Schizophrenia Mother    Sleep apnea Mother    Obesity Mother    Seizures Son     Social History   Socioeconomic History   Marital status: Married    Spouse name: Darina   Number of children: 1   Years of education: Not on file   Highest education level: Not on file  Occupational History   Occupation: Runner, broadcasting/film/video, Paediatric nurse    Employer: [employer]    Comment: Page Aeronautical engineer: [employer]  Tobacco Use   Smoking status: Never   Smokeless tobacco: Never  Vaping Use   Vaping status: Never Used  Substance and Sexual Activity   Alcohol use: No   Drug use: No   Sexual activity: Yes    Partners: Male    Birth  control/protection: None    Comment: menarche age 40,premenopausa; G!P1,1st pregnancy age 58  Other Topics Concern   Not on file  Social History Narrative   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 39 ten year old son   Social Drivers of Community education officer: Not on file  Food Insecurity: Not on file  Transportation Needs: Not on file  Physical Activity: Not on file  Stress: Not on file  Social Connections: Not on file  Intimate Partner Violence: Not on file    Outpatient Medications Prior to Visit  Medication Sig Dispense Refill   b complex vitamins tablet Take 1 tablet by mouth daily.     carboxymethylcellulose (REFRESH PLUS) 0.5 % SOLN 1 drop 3 (three) times daily as needed.     celecoxib  (CELEBREX ) 200 MG capsule Take 1 tablet by mouth daily.     cholecalciferol (VITAMIN D ) 1000 UNITS tablet Take 1,000 Units by mouth daily.     fluticasone  (FLONASE ) 50 MCG/ACT nasal spray Place 2 sprays into both nostrils daily. 16 g 6   levocetirizine (XYZAL ) 5 MG tablet TAKE 1 TABLET BY MOUTH EVERY DAY IN THE EVENING 90 tablet 0   levothyroxine  (SYNTHROID ) 50 MCG tablet TAKE 1 TABLET BY MOUTH DAILY BEFORE BREAKFAST 90 tablet 0   No facility-administered medications prior to visit.    Allergies  Allergen Reactions   Penicillins Other (See Comments)    Not sure, but was told by mother never to take this medication as a child   Shellfish-Derived Products Other (See Comments)    Welts   Clarithromycin Other (See Comments)    REACTION: NAUSEA,WEAK,BITTER TASTE   Ondansetron  Nausea And Vomiting    Headaches    Review of Systems  Constitutional:  Negative for fever and malaise/fatigue.  HENT:  Positive for congestion, ear pain and sore throat. Negative for sinus pain.   Eyes:  Negative for blurred vision.  Respiratory:  Positive for cough and sputum production. Negative for shortness of breath.   Cardiovascular:  Negative for chest pain, palpitations and  leg swelling.  Gastrointestinal:  Negative for vomiting.  Musculoskeletal:  Negative for back pain.  Skin:  Negative for rash.  Neurological:  Negative for loss of consciousness and headaches.       Objective:    Physical Exam Vitals and nursing note reviewed.  Constitutional:      General: She is not in acute distress.    Appearance: Normal appearance. She is well-developed.  HENT:     Head: Normocephalic and atraumatic.  Eyes:     General: No scleral icterus.  Right eye: No discharge.        Left eye: No discharge.  Cardiovascular:     Rate and Rhythm: Normal rate and regular rhythm.     Heart sounds: No murmur heard. Pulmonary:     Effort: Pulmonary effort is normal. No respiratory distress.     Breath sounds: Normal breath sounds. No wheezing, rhonchi or rales.  Chest:     Chest wall: No tenderness.  Musculoskeletal:        General: No swelling. Normal range of motion.     Cervical back: Normal range of motion and neck supple.     Right lower leg: No edema.     Left lower leg: No edema.  Skin:    General: Skin is warm and dry.  Neurological:     General: No focal deficit present.     Mental Status: She is alert and oriented to person, place, and time.  Psychiatric:        Mood and Affect: Mood normal.        Behavior: Behavior normal.        Thought Content: Thought content normal.        Judgment: Judgment normal.     BP 114/80 (BP Location: Right Arm, Patient Position: Sitting, Cuff Size: Large)   Pulse 84   Temp 98.4 F (36.9 C) (Oral)   Resp 18   Ht 5' 6 (1.676 m)   Wt 206 lb 12.8 oz (93.8 kg)   LMP 02/27/2012   SpO2 97%   BMI 33.38 kg/m  Wt Readings from Last 3 Encounters:  07/19/24 206 lb 12.8 oz (93.8 kg)  07/17/24 201 lb (91.2 kg)  06/18/24 205 lb (93 kg)    Diabetic Foot Exam - Simple   No data filed    Lab Results  Component Value Date   WBC 4.3 05/27/2020   HGB 12.2 05/27/2020   HCT 38.8 05/27/2020   PLT 227 05/27/2020    GLUCOSE 80 02/20/2024   CHOL 179 02/20/2024   TRIG 90 02/20/2024   HDL 43 02/20/2024   LDLCALC 119 (H) 02/20/2024   ALT 19 02/20/2024   AST 15 02/20/2024   NA 140 02/20/2024   K 4.4 02/20/2024   CL 103 02/20/2024   CREATININE 0.81 02/20/2024   BUN 15 02/20/2024   CO2 24 02/20/2024   TSH 2.93 09/18/2023   INR 1.04 02/10/2011   HGBA1C 4.9 02/20/2024    Lab Results  Component Value Date   TSH 2.93 09/18/2023   Lab Results  Component Value Date   WBC 4.3 05/27/2020   HGB 12.2 05/27/2020   HCT 38.8 05/27/2020   MCV 89 05/27/2020   PLT 227 05/27/2020   Lab Results  Component Value Date   NA 140 02/20/2024   K 4.4 02/20/2024   CHLORIDE 105 12/25/2014   CO2 24 02/20/2024   GLUCOSE 80 02/20/2024   BUN 15 02/20/2024   CREATININE 0.81 02/20/2024   BILITOT 0.2 02/20/2024   ALKPHOS 111 02/20/2024   AST 15 02/20/2024   ALT 19 02/20/2024   PROT 6.7 02/20/2024   ALBUMIN 4.1 02/20/2024   CALCIUM 10.2 02/20/2024   ANIONGAP 8 12/25/2014   EGFR 84 02/20/2024   GFR 76.80 12/25/2019   Lab Results  Component Value Date   CHOL 179 02/20/2024   Lab Results  Component Value Date   HDL 43 02/20/2024   Lab Results  Component Value Date   LDLCALC 119 (H) 02/20/2024  Lab Results  Component Value Date   TRIG 90 02/20/2024   Lab Results  Component Value Date   CHOLHDL 4.2 07/04/2023   Lab Results  Component Value Date   HGBA1C 4.9 02/20/2024       Assessment & Plan:  Chest congestion Assessment & Plan: Take xyzal  and flonase  Take mucinex for cough If no better in a few days can take z pak  Orders: -     POC COVID-19 BinaxNow -     Azithromycin ; Take 2 tab po the first day then take 1 tablet po daily for 4 days  Dispense: 6 tablet; Refill: 0  Lower extremity edema Assessment & Plan: Keep elevated  Compression socks  Return to office as needed    Assessment and Plan Assessment & Plan Acute upper respiratory symptoms with phlegm   She has acute upper  respiratory symptoms with phlegm since yesterday, without fever but with a scratchy throat. A COVID test is negative, and symptoms suggest a mild upper respiratory infection. Prescribe Z-Pak with instructions to delay use until Sunday if symptoms do not improve or worsen. Recommend Mucinex to help break up phlegm. Advise continued use of Flonase  and Xyzal . Start antibiotic if no improvement by Sunday.  Bilateral lower extremity swelling, improved   Bilateral lower extremity swelling, especially in the left ankle, has improved. 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 No current need for diuretics as swelling is improving. Advise keeping legs elevated and recommend wearing compression socks, especially during flights. Consider diuretic if swelling worsens.    Aqil Goetting R Lowne Chase, DO

## 2024-08-12 NOTE — Progress Notes (Unsigned)
 SUBJECTIVE: Discussed the use of AI scribe software for clinical note transcription with the patient, who gave verbal consent to proceed.  Chief Complaint: Obesity  Interim History: She is down 3 lbs since her last visit.   Alisha Chen is here to discuss her progress with her obesity treatment plan. She is on the keeping a food journal and adhering to recommended goals of 1000 calories and 80+ grams of protein and states she is following her eating plan approximately 50 % of the time. She states she is exercising walking 30 minutes 5 times per week.  Alisha Chen is a 58 year old female who presents for follow-up of her obesity treatment plan.  She has lost three more pounds since her last visit. She journals her intake, consuming approximately 1000 calories and 80 grams of protein about 50% of the time. She exercises regularly, walking 30 to 60 minutes five times per week. She feels 'scared' about weight loss due to past experiences with cancer, which she associates with previous weight loss.  She has a history of prediabetes, hypothyroidism, vitamin D  deficiency, and B12 deficiency. No new symptoms such as lumps or bumps are reported, and she has not felt anything concerning. She continues to see her cancer doctors for annual surveillance, with her last visit in February 2025. Next visit with Oncology not due until Feb 2026.  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 She describes her workdays as long, often exceeding 12 hours, but she is adapting to the new environment. She handles stress by taking things 'step by step' and does not feel excessively stressed or worried.  Regarding her nutrition, she sometimes skips meals due to her busy schedule, often not eating until later in the day. 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  She tries to eat snacks when possible but sometimes only manages to eat while waiting for appointments. OBJECTIVE: Visit Diagnoses: Problem List Items Addressed This Visit     Insulin  resistance - Primary   Vitamin D  deficiency   Hypothyroidism   Other hyperlipidemia   Class 1 obesity with serious comorbidity and body mass index (BMI) of 32.0 to 32.9 in adult   Lower extremity edema   Other Visit Diagnoses       BMI 32.0-32.9,adult Current BMI 32.2          Obesity, class 1 Alisha Chen is experiencing weight loss, having lost three more pounds since the last visit. She is following a calorie-restricted diet and engaging in regular exercise, walking 30 to 60 minutes five times per week. She expresses psychological concerns related to past experiences with weight loss and cancer, which are being addressed through reassurance and encouragement of her current healthy weight loss methods. She is advised to maintain consistent protein intake throughout the day to prevent metabolic slowdown. - Continue current diet and exercise regimen - Encourage consistent protein intake throughout the day to prevent metabolic slowdown - Reassure regarding healthy weight loss methods  Cancer in remission, under surveillance Alisha Chen is in remission and under surveillance for cancer. She had her last surveillance visit in February and is due for her next visit in Feb 2026. She expresses anxiety related to her past cancer experience, which is being addressed through reassurance and discussion of her current health status. There is consideration of discussing with the  oncology team if earlier surveillance is needed for reassurance. - Continue annual surveillance with oncology - Consider discussing with oncology team if earlier surveillance is needed for reassurance  Insulin  Resistance Last fasting insulin  was 19.2- not at goal. A1c was 4.9- at goal. Polyphagia:No Medication(s): None Lab Results   Component Value Date   HGBA1C 4.9 02/20/2024   HGBA1C 5.0 07/04/2023   HGBA1C 4.8 07/14/2021   HGBA1C 5.0 11/10/2020   HGBA1C 5.0 05/27/2020   Lab Results  Component Value Date   INSULIN  19.2 02/20/2024   INSULIN  15.8 07/04/2023   INSULIN  12.6 07/14/2021   INSULIN  11.8 11/10/2020   INSULIN  13.5 05/27/2020    Plan:  Prefers to work on nutrition and exercise and avoid medications at this time.  Continue working on nutrition plan to decrease simple carbohydrates, increase lean proteins and exercise to promote weight loss, improve glycemic control and prevent progression to Type 2 diabetes.    Vitamin D  Deficiency Vitamin D  is at goal of 50.  Most recent vitamin D  level was 57.2. She is on OTC vitamin D3 1000 IU daily. Lab Results  Component Value Date   VD25OH 57.2 02/20/2024   VD25OH 55.9 07/04/2023   VD25OH 57.5 07/14/2021    Plan: Continue OTC vitamin D3 1000 IU daily Low vitamin D  levels can be associated with adiposity and may result in leptin resistance and weight gain. Also associated with fatigue.  Currently on vitamin D  supplementation without any adverse effects such as nausea, vomiting or muscle weakness.    Vitals Temp: 98.3 F (36.8 C) BP: 121/78 Pulse Rate: 90 SpO2: 97 %   Anthropometric Measurements Height: 5' 6 (1.676 m) Weight: 199 lb (90.3 kg) BMI (Calculated): 32.13 Weight at Last Visit: 201lb Weight Lost Since Last Visit: 3lb Weight Gained Since Last Visit: 0lb Starting Weight: 201lb Total Weight Loss (lbs): 2 lb (0.907 kg) Peak Weight: 208lb   Body Composition  Body Fat %: 35.3 % Fat Mass (lbs): 70.4 lbs Muscle Mass (lbs): 122.4 lbs Total Body Water (lbs): 79.6 lbs Visceral Fat Rating : 10   Other Clinical Data Fasting: No Labs: no Today's Visit #: 72 Starting Date: 12/10/18     ASSESSMENT AND PLAN:  Diet: Alisha Chen is currently in the action stage of change. As such, her goal is to continue with weight loss efforts. She  has agreed to keeping a food journal and adhering to recommended goals of 1000 calories and 80+ grams of protein.  Exercise: Alisha Chen has been instructed to work up to a goal of 150 minutes of combined cardio and strengthening exercise per week and to continue exercising as is for weight loss and overall health benefits.   Behavior Modification:  We discussed the following Behavioral Modification Strategies today: increasing lean protein intake, decreasing simple carbohydrates, increasing vegetables, increase H2O intake, no skipping meals, meal planning and cooking strategies, avoiding temptations, planning for success, and keep a strict food journal. We discussed various medication options to help Alisha Chen with her weight loss efforts and we both agreed to continue current treatment plan.  Return in about 4 weeks (around 09/11/2024).Alisha Chen She was informed of the importance of frequent follow up visits to maximize her success with intensive lifestyle modifications for her multiple health conditions.  Attestation Statements:   Reviewed by clinician on day of visit: allergies, medications, problem list, medical history, surgical history, family history, social history, and previous encounter notes.   Time spent on visit including pre-visit chart review and post-visit care and charting  was 29 minutes.    Alisha Gandolfi, PA-C

## 2024-08-14 ENCOUNTER — Encounter (INDEPENDENT_AMBULATORY_CARE_PROVIDER_SITE_OTHER): Payer: Self-pay | Admitting: Physician Assistant

## 2024-08-14 ENCOUNTER — Ambulatory Visit (INDEPENDENT_AMBULATORY_CARE_PROVIDER_SITE_OTHER): Admitting: Physician Assistant

## 2024-08-14 VITALS — BP 121/78 | HR 90 | Temp 98.3°F | Ht 66.0 in | Wt 199.0 lb

## 2024-08-14 DIAGNOSIS — R600 Localized edema: Secondary | ICD-10-CM

## 2024-08-14 DIAGNOSIS — E039 Hypothyroidism, unspecified: Secondary | ICD-10-CM | POA: Diagnosis not present

## 2024-08-14 DIAGNOSIS — E88819 Insulin resistance, unspecified: Secondary | ICD-10-CM | POA: Diagnosis not present

## 2024-08-14 DIAGNOSIS — E559 Vitamin D deficiency, unspecified: Secondary | ICD-10-CM

## 2024-08-14 DIAGNOSIS — Z6832 Body mass index (BMI) 32.0-32.9, adult: Secondary | ICD-10-CM

## 2024-08-14 DIAGNOSIS — Z853 Personal history of malignant neoplasm of breast: Secondary | ICD-10-CM

## 2024-08-14 DIAGNOSIS — E7849 Other hyperlipidemia: Secondary | ICD-10-CM | POA: Diagnosis not present

## 2024-08-14 DIAGNOSIS — E66811 Obesity, class 1: Secondary | ICD-10-CM

## 2024-08-18 ENCOUNTER — Other Ambulatory Visit: Payer: Self-pay | Admitting: Family Medicine

## 2024-08-18 DIAGNOSIS — E039 Hypothyroidism, unspecified: Secondary | ICD-10-CM

## 2024-09-09 NOTE — Progress Notes (Unsigned)
 SUBJECTIVE: Discussed the use of AI scribe software for clinical note transcription with the patient, who gave verbal consent to proceed.  Chief Complaint: Obesity  Interim History: She is up 1 lbs since her last visit.   Muscle mass + 0.8 lbs Adipose mass -Stable. No change  Alisha Chen is here to discuss her progress with her obesity treatment plan. She is on the keeping a food journal and adhering to recommended goals of 1000 calories and 80+ grams of protein and states she is following her eating plan approximately 50 % of the time. She states she is exercising walking 15-20 minutes 5 times per week. Alisha Chen is a 58 year old female who presents for follow-up of her obesity treatment plan.  She is compliant with her journaling plan approximately 50% of the time, tracking 1000 calories a day and 80 or more grams of protein. She has gained one pound since her last visit but notes an increase in muscle mass and a decrease in adipose mass, with her body adipose percentage improving to 35.2% and her visceral adipose rating reaching a goal of 10.  Her diet includes more whole foods and adequate protein, but she acknowledges not always drinking enough water. She is not skipping meals and typically sleeps six to seven hours nightly. Her physical activity includes walking at work and walking 15 to 20 minutes for five days per week.  Her current medications include Synthroid  50 micrograms daily for hypothyroidism, over-the-counter vitamin D  1000 units daily for vitamin D  deficiency, and B complex vitamins for energy support. She has a history of elevated calcium levels, which have now normalized, and her PTH levels have been in the normal range without evidence of hyperparathyroidism.  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 She is planning to retire soon and  is working long hours, often staying late at work.  She consumes about 32 ounces of water daily and acknowledges difficulty increasing this due to limited restroom access during the day. She experiences occasional cravings for unhealthy snacks like potato chips and candy but tries to manage these with healthier options when possible.  Her insulin  level was noted to be high at 19 in April, and her A1c levels have been good. She has previously tried metformin , which caused a metallic taste, and Saxenda , which led to constipation.  OBJECTIVE: Visit Diagnoses: Problem List Items Addressed This Visit     Insulin  resistance - Primary   Vitamin D  deficiency   Hypothyroidism   Other hyperlipidemia   Class 1 obesity with serious comorbidity and body mass index (BMI) of 32.0 to 32.9 in adult   Other Visit Diagnoses       BMI 32.0-32.9,adult Current BMI 32.3         Obesity with insulin  resistance Obesity with insulin  resistance, well-managed with stable weight at 200 lbs and improved body composition. Muscle mass increased from 122 to 123.2 lbs. Body adipose percentage improved to 35.2%, nearing goal of 35% or less. Insulin  level remains elevated at 19, indicating insulin  resistance. A1c levels are within normal range, suggesting adequate compensation by the body. She is managing weight without medications, focusing on diet and exercise. Stress and inadequate sleep may contribute to elevated cortisol and insulin  levels. She prefers to manage weight naturally without medications at this time. - Continue current diet plan with 1000 calories and 80 grams of protein  daily. - Encourage increased water intake to at least 64 ounces daily. - Advise on healthy snack alternatives such as yogurt with fruit and homemade popcorn. - Plan for fasting lab to recheck insulin  levels during next visit or holiday break.  Insulin  Resistance Last fasting insulin  was 19.2. A1c was 4.9. 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 Optimal level < 1.9. This is complex condition associated with genetics, ectopic fat and lifestyle factors. Insulin  resistance may result in weight gain, abnormal cravings (particularly for carbs) and fatigue. This may result in additional weight gain and lead to pre-diabetes and diabetes if untreated.   Lab Results  Component Value Date   HGBA1C 4.9 02/20/2024   Lab Results  Component Value Date   INSULIN  19.2 02/20/2024   INSULIN  15.8 07/04/2023   INSULIN  12.6 07/14/2021   INSULIN  11.8 11/10/2020   INSULIN  13.5 05/27/2020   Lab Results  Component Value Date   GLUCOSE 80 02/20/2024   GLUCOSE 66 (L) 08/23/2010    We reviewed treatment options which include losing 7 to 10% of body weight, increasing physical activity to a 150 minutes a week of moderate intensity.She may also be a candidate for pharmacoprophylaxis with metformin  or incretin mimetic. She is hesitant to start medications as she had SE in the past. She would like to continue to work on her nutrition and exercise to promote weight loss.  Will plan to recheck fasting labs over the next 1-2 months.   Hypothyroidism Hypothyroidism is managed with Synthroid  50 mcg daily. No SE. Some fatigue with increased stressors at work and decreased sleeping duration due to long working hours, limited time to rest.  Plan Continue to work on nutrition and exercise to promote healthy weight loss Continue Synthroid  50 mcg daily Will plan to recheck thyroid  function over the next 1-2 months  Vitamin D  deficiency Vitamin D  deficiency managed with over-the-counter  vitamin D  1000 units daily. No current issues reported. Reports some fatigue related to increased stress and not having enough time for adequate sleep currently.  Last vitamin D  Lab Results  Component Value Date   VD25OH 57.2 02/20/2024   Low vitamin D  levels can be associated with adiposity and may result in leptin resistance and weight gain. Also associated with fatigue.  Currently on vitamin D  supplementation without any adverse effects such as nausea, vomiting or muscle weakness.  Will plan to recheck vitamin D  levels over the next 1-2 months along with other vitamin levels.   Continue OTC vitamin D  1000 units daily.   Vitals Temp: 98.3 F (36.8 C) BP: 115/74 Pulse Rate: 79 SpO2: 99 %   Anthropometric Measurements Height: 5' 6 (1.676 m) Weight: 200 lb (90.7 kg) BMI (Calculated): 32.3 Weight at Last Visit: 199 lb Weight Lost Since Last Visit: 0 Weight Gained Since Last Visit: 1 lb Starting Weight: 201 lb Total Weight Loss (lbs): 1 lb (0.454 kg) Peak Weight: 208 lb   Body Composition  Body Fat %: 35.2 % Fat Mass (lbs): 70.4 lbs Muscle Mass (lbs): 123.2 lbs Total Body Water (lbs): 79.2 lbs Visceral Fat  Rating : 10   Other Clinical Data Fasting: No Labs: No Today's Visit #: 50 Starting Date: 12/10/18     ASSESSMENT AND PLAN:  Diet: Alisha Chen is currently in the action stage of change. As such, her goal is to continue with weight loss efforts. She has agreed to keeping a food journal and adhering to recommended goals of 1000 calories and 80+ grams of protein.  Exercise: Alisha Chen has been instructed to work up to a goal of 150 minutes of combined cardio and strengthening exercise per week and to continue exercising as is for weight loss and overall health benefits.   Behavior Modification:  We discussed the following Behavioral Modification Strategies today: increasing lean protein intake, decreasing simple carbohydrates, increasing vegetables, increase H2O intake,  increase high fiber foods, meal planning and cooking strategies, better snacking choices, avoiding temptations, and planning for success. We discussed various medication options to help Alisha Chen with her weight loss efforts and we both agreed to continue current treatment plan.  Return in about 4 weeks (around 10/08/2024).[name] She was informed of the importance of frequent follow up visits to maximize her success with intensive lifestyle modifications for her multiple health conditions.  Attestation Statements:   Reviewed by clinician on day of visit: allergies, medications, problem list, medical history, surgical history, family history, social history, and previous encounter notes.   Time spent on visit including pre-visit chart review and post-visit care and charting was 40 minutes.    Reubin Bushnell, PA-C

## 2024-09-10 ENCOUNTER — Ambulatory Visit (INDEPENDENT_AMBULATORY_CARE_PROVIDER_SITE_OTHER): Admitting: Physician Assistant

## 2024-09-10 ENCOUNTER — Encounter (INDEPENDENT_AMBULATORY_CARE_PROVIDER_SITE_OTHER): Payer: Self-pay | Admitting: Physician Assistant

## 2024-09-10 VITALS — BP 115/74 | HR 79 | Temp 98.3°F | Ht 66.0 in | Wt 200.0 lb

## 2024-09-10 DIAGNOSIS — Z6832 Body mass index (BMI) 32.0-32.9, adult: Secondary | ICD-10-CM

## 2024-09-10 DIAGNOSIS — E88819 Insulin resistance, unspecified: Secondary | ICD-10-CM | POA: Diagnosis not present

## 2024-09-10 DIAGNOSIS — E66811 Obesity, class 1: Secondary | ICD-10-CM | POA: Diagnosis not present

## 2024-09-10 DIAGNOSIS — E559 Vitamin D deficiency, unspecified: Secondary | ICD-10-CM

## 2024-09-10 DIAGNOSIS — E039 Hypothyroidism, unspecified: Secondary | ICD-10-CM

## 2024-09-10 DIAGNOSIS — E7849 Other hyperlipidemia: Secondary | ICD-10-CM

## 2024-10-06 NOTE — Progress Notes (Unsigned)
 SUBJECTIVE: Discussed the use of AI scribe software for clinical note transcription with the patient, who gave verbal consent to proceed.  Chief Complaint: Obesity  Interim History: She has maintained since her last visit.  Her Body composition has improved.  Body fat % improved to 34.3% Visceral adipose rating improved to 9  Muscle mass + 2.0 lbs  Adipose mass - 1.6 lbs  Alisha Chen is here to discuss her progress with her obesity treatment plan. She is on the keeping a food journal and adhering to recommended goals of 1000 calories and 80 grams of protein and states she is following her eating plan approximately 50 % of the time. 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 Alisha Chen is a 58 year old female who presents for follow-up of her obesity treatment plan.  She has a history of insulin  resistance, hypothyroidism, and vitamin D  deficiency. She experiences significant work-related stress, which she believes impacts her health. She is planning for retirement in March 2026.  She has been unable to tolerate metformin  and Saxenda  due to significant constipation. She has previously declined the use of Topamax and Contrave.  Her body adipose percentage is now 34.3%. Although her weight has remained stable, she has gained two pounds of muscle and lost almost two pounds of adipose tissue. She has been physically active, including moving at school and participating in exercise classes with her church group from May to July.  She has a challenging sleep schedule, going to bed between 9 and 11 PM on weekdays and waking up at 4 or 5 AM. On Sundays, she tries to go to bed as early as 5 or 6 PM to get extra rest before starting her challenging weeks at work.   She feels 'saggy' in certain areas, particularly her stomach, despite feeling that her legs and thighs are getting a good workout from being up and down stairs. We discussed trying to work on core exercises  such as planks, dead bugs and sit-ups to address this.  She ensures she is getting enough protein and whole foods, and she does not skip meals. She has not had recent labs done since April.  OBJECTIVE: Visit Diagnoses: Problem List Items Addressed This Visit     Insulin  resistance - Primary   Class 1 obesity with serious comorbidity and body mass index (BMI) of 32.0 to 32.9 in adult   Other Visit Diagnoses       Stress         BMI 32.0-32.9,adult Current BMI 32.4         Obesity with insulin  resistance Obesity with insulin  resistance, managed with lifestyle modifications. Body adipose percentage decreased to 34.3%, below the goal of 35% or less. Muscle mass increased by 2 pounds, and adipose mass decreased by almost 2 pounds. Weight remains stable. Stress and sleep patterns may impact cortisol levels and weight management.  Current exercise regimen includes walking and potential use of an elliptical machine. Core strengthening exercises discussed to improve body composition and reduce sagging. - Continue current exercise regimen, including walking and potential use of elliptical machine. - Incorporate core strengthening exercises such as planks and dead bugs. - Maintain adequate protein intake and balanced nutrition. - Encouraged continued physical activity over the holidays. - Scheduled fasting labs and metabolic testing on December 23rd, 2025.  Insulin  Resistance Last fasting insulin  was 19.2- not at goal. A1c was 4.9- at goal. Polyphagia:No Medication(s): None Lab Results  Component Value Date  HGBA1C 4.9 02/20/2024   HGBA1C 5.0 07/04/2023   HGBA1C 4.8 07/14/2021   HGBA1C 5.0 11/10/2020   HGBA1C 5.0 05/27/2020   Lab Results  Component Value Date   INSULIN  19.2 02/20/2024   INSULIN  15.8 07/04/2023   INSULIN  12.6 07/14/2021   INSULIN  11.8 11/10/2020   INSULIN  13.5 05/27/2020    Plan:  Continue working on nutrition plan to decrease simple carbohydrates, increase lean  proteins and exercise to promote weight loss, improve glycemic control and prevent progression to Type 2 diabetes.     Work-related stress Significant work-related stress impacting sleep and potentially affecting weight management. Stress management strategies discussed, including potential retirement plans and upcoming holidays for rest. - Plan for upcoming holidays and retirement to reduce stress. Focus on trying to get adequate sleep and exercise to reduce stress.   Vitals Temp: 98 F (36.7 C) BP: 118/73 Pulse Rate: 85 SpO2: 100 %   Anthropometric Measurements Height: 5' 6 (1.676 m) Weight: 200 lb (90.7 kg) BMI (Calculated): 32.3 Weight at Last Visit: 200 lb Weight Lost Since Last Visit: 0 Weight Gained Since Last Visit: 0 Starting Weight: 201 lb Total Weight Loss (lbs): 1 lb (0.454 kg) Peak Weight: 208 lb   Body Composition  Body Fat %: 34.3 % Fat Mass (lbs): 68.8 lbs Muscle Mass (lbs): 125.2 lbs Total Body Water (lbs): 77.6 lbs Visceral Fat Rating : 9   Other Clinical Data Fasting: No Labs: No Today's Visit #: 47 Starting Date: 12/10/18     ASSESSMENT AND PLAN:  Diet: Janaiah is currently in the action stage of change. As such, her goal is to continue with weight loss efforts. She has agreed to keeping a food journal and adhering to recommended goals of 1000 calories and 80 grams of protein.  Exercise: Rilla has been instructed to work up to a goal of 150 minutes of combined cardio and strengthening exercise per week and that some exercise is better than none for weight loss and overall health benefits.   Behavior Modification:  We discussed the following Behavioral Modification Strategies today: increasing lean protein intake, decreasing simple carbohydrates, increasing vegetables, increase H2O intake, increase high fiber foods, meal planning and cooking strategies, holiday eating strategies, avoiding temptations, and planning for success. We discussed  various medication options to help Wilson with her weight loss efforts and we both agreed to continue to work on nutritional and behavioral strategies to promote weight loss.  .  Return in about 5 weeks (around 11/11/2024) for Fasting Lab, Fasting IC- arrive 30 minutes prior to appt.. She was informed of the importance of frequent follow up visits to maximize her success with intensive lifestyle modifications for her multiple health conditions.  Attestation Statements:   Reviewed by clinician on day of visit: allergies, medications, problem list, medical history, surgical history, family history, social history, and previous encounter notes.   Time spent on visit including pre-visit chart review and post-visit care and charting was 23 minutes.    Winslow Verrill, PA-C

## 2024-10-07 ENCOUNTER — Encounter (INDEPENDENT_AMBULATORY_CARE_PROVIDER_SITE_OTHER): Payer: Self-pay | Admitting: Physician Assistant

## 2024-10-07 ENCOUNTER — Ambulatory Visit (INDEPENDENT_AMBULATORY_CARE_PROVIDER_SITE_OTHER): Payer: Self-pay | Admitting: Physician Assistant

## 2024-10-07 VITALS — BP 118/73 | HR 85 | Temp 98.0°F | Ht 66.0 in | Wt 200.0 lb

## 2024-10-07 DIAGNOSIS — F439 Reaction to severe stress, unspecified: Secondary | ICD-10-CM | POA: Diagnosis not present

## 2024-10-07 DIAGNOSIS — Z6832 Body mass index (BMI) 32.0-32.9, adult: Secondary | ICD-10-CM | POA: Diagnosis not present

## 2024-10-07 DIAGNOSIS — E559 Vitamin D deficiency, unspecified: Secondary | ICD-10-CM

## 2024-10-07 DIAGNOSIS — E88819 Insulin resistance, unspecified: Secondary | ICD-10-CM | POA: Diagnosis not present

## 2024-10-07 DIAGNOSIS — E66811 Obesity, class 1: Secondary | ICD-10-CM | POA: Diagnosis not present

## 2024-10-07 DIAGNOSIS — E039 Hypothyroidism, unspecified: Secondary | ICD-10-CM

## 2024-11-11 NOTE — Progress Notes (Unsigned)
 "  SUBJECTIVE: Discussed the use of AI scribe software for clinical note transcription with the patient, who gave verbal consent to proceed.  Chief Complaint: Obesity  Interim History: She is down 1 lb since her last visit.    Alisha Chen is here to discuss her progress with her obesity treatment plan. She is on the keeping a food journal and adhering to recommended goals of 1000 calories and 80 grams of protein and states she is following her eating plan approximately 50 % of the time. She states she is not exercising.  Alisha Chen is a 58 year old female who presents for follow-up of her obesity treatment plan.  She has a history of insulin  resistance, vitamin D  deficiency, hyperlipidemia, hypothyroidism, stress with polyphagia, and fatigue. Current medications include B complex vitamins, over-the-counter vitamin D  1000 units daily, and levothyroxine  50 mcg daily.  She prefers to manage her condition through nutritional and exercise interventions rather than adding more medications. She is fasting for labs today, which will include tests for insulin  levels, A1c, kidney and liver function, and a TSH/thyroid  panel to check her thyroid  function.  Her energy levels improve during breaks from work due to increased sleep. During work periods, her sleep is insufficient, as she goes to bed between 9 and 11 PM and wakes up between 4 and 5 AM. Over the break, she is able to sleep in and get up to eight hours of sleep.  She reports being less physically active over the past month and not getting her regular walking in. She acknowledges not getting her regular walking in.  She is mindful of her diet, especially during the holiday season, and tries to avoid consuming excessive sweets and treats. She also acknowledges not drinking enough water, particularly during work hours.  Fasting labs obtained. The patient was informed we would discuss the lab results at the next visit unless there is a critical  issue that needs to be addressed sooner. The patient agreed to keep the next visit at the agreed upon time to discuss these results.   OBJECTIVE: Visit Diagnoses: Problem List Items Addressed This Visit     Insulin  resistance - Primary   Relevant Orders   CMP14+EGFR   Hemoglobin A1c   Insulin , random   Vitamin D  deficiency   Relevant Orders   VITAMIN D  25 Hydroxy (Vit-D Deficiency, Fractures)   Hypothyroidism   Relevant Orders   TSH   T3   T4, free   Other hyperlipidemia   Relevant Orders   Lipid Panel With LDL/HDL Ratio   Polyphagia   Class 1 obesity with serious comorbidity and body mass index (BMI) of 32.0 to 32.9 in adult   Other Visit Diagnoses       Stress         Other fatigue       Relevant Orders   Vitamin B12   CBC with Differential/Platelet     BMI 32.0-32.9,adult Current BMI 32.2         Obesity with insulin  resistance Obesity with associated insulin  resistance and hyperlipidemia. Recent slight weight loss over Thanksgiving. Current weight is 199 lbs. Muscle mass decreased slightly, likely due to reduced activity. Adipose tissue increased by 3.2 lbs. Prefers nutritional and exercise interventions over additional medications. - Ordered fasting labs including insulin  level, A1c, chemistries, kidney and liver function tests - Encouraged continuation of nutritional and exercise interventions - Advised on maintaining protein intake and hydration - Recommended walking videos for exercise, aiming for 3-4  days per week   Insulin  Resistance Last fasting insulin  was 19.2- not at goal. A1c was 4.9- at goal. Polyphagia:No Medication(s): None Lab Results  Component Value Date   HGBA1C 4.9 02/20/2024   HGBA1C 5.0 07/04/2023   HGBA1C 4.8 07/14/2021   HGBA1C 5.0 11/10/2020   HGBA1C 5.0 05/27/2020   Lab Results  Component Value Date   INSULIN  19.2 02/20/2024   INSULIN  15.8 07/04/2023   INSULIN  12.6 07/14/2021   INSULIN  11.8 11/10/2020   INSULIN  13.5 05/27/2020     Plan:  Continue working on nutrition plan to decrease simple carbohydrates, increase lean proteins and exercise to promote weight loss, improve glycemic control and prevent progression to Type 2 diabetes.  Recheck labs today- CMET, A1c, insulin  with other labs today  Hyperlipidemia LDL is not at goal. Medication(s): not on statin therapy.  Cardiovascular risk factors: dyslipidemia, obesity (BMI >= 30 kg/m2), and sedentary lifestyle  Lab Results  Component Value Date   CHOL 179 02/20/2024   HDL 43 02/20/2024   LDLCALC 119 (H) 02/20/2024   TRIG 90 02/20/2024   CHOLHDL 4.2 07/04/2023   CHOLHDL 4.0 10/21/2019   CHOLHDL 3 08/14/2014   Lab Results  Component Value Date   ALT 19 02/20/2024   AST 15 02/20/2024   ALKPHOS 111 02/20/2024   BILITOT 0.2 02/20/2024   The 10-year ASCVD risk score (Arnett DK, et al., 2019) is: 3.6%   Values used to calculate the score:     Age: 60 years     Clinically relevant sex: Female     Is Non-Hispanic African American: Yes     Diabetic: No     Tobacco smoker: No     Systolic Blood Pressure: 114 mmHg     Is BP treated: No     HDL Cholesterol: 43 mg/dL     Total Cholesterol: 179 mg/dL  Plan: Statin not indicated by risk scoring.  Continue to work on nutrition plan -decreasing simple carbohydrates, increasing lean proteins, decreasing saturated fats and cholesterol , avoiding trans fats and exercise as able to promote weight loss, improve lipids and decrease cardiovascular risks. Recheck fasting lipids today  Hypothyroidism Managed with levothyroxine  50 mcg daily. Last thyroid  function test was in October 2024. No recent endocrinology follow-up. - Ordered TSH and full thyroid  panel - Continue levothyroxine  50 mcg daily  Vitamin D  deficiency Managed with over-the-counter vitamin D  1000 units daily. Last vitamin D  Lab Results  Component Value Date   VD25OH 57.2 02/20/2024   Low vitamin D  levels can be associated with adiposity and may  result in leptin resistance and weight gain. Also associated with fatigue.  Currently on vitamin D  supplementation without any adverse effects such as nausea, vomiting or muscle weakness.  Recheck vitamin D  level today with other labs - Continue vitamin D  1000 units daily  Stress and fatigue Related to work schedule and lifestyle. - The patient experiences stress related to her son's health condition, which affects family plans. She is on a break from work until January 5th and finds it easier to manage her sleep and stress during this time. She is mindful of her diet and exercise, especially during holidays, and uses walking videos for exercise. She struggles with maintaining hydration due to work constraints. Reports improved energy during breaks due to increased sleep. Stress management includes avoiding over-scheduling and maintaining a flexible schedule. Recheck vitamin D  and B12 as well as thyroid  function and CBC today Vitals Temp: 98 F (36.7 C) BP: 114/75 Pulse  Rate: 75 SpO2: 98 %   Anthropometric Measurements Height: 5' 6 (1.676 m) Weight: 199 lb (90.3 kg) BMI (Calculated): 32.13 Weight at Last Visit: 200lb Weight Lost Since Last Visit: 1lb Weight Gained Since Last Visit: 0lb Starting Weight: 201lb Total Weight Loss (lbs): 2 lb (0.907 kg) Peak Weight: 208lb   Body Composition  Body Fat %: 36 % Fat Mass (lbs): 72 lbs Muscle Mass (lbs): 121.4 lbs Total Body Water (lbs): 72.6 lbs Visceral Fat Rating : 10   Other Clinical Data Fasting: Yes Labs: No Today's Visit #: 31 Starting Date: 12/10/18     ASSESSMENT AND PLAN:  Diet: Alisha Chen is currently in the action stage of change. As such, her goal is to continue with weight loss efforts. She has agreed to keeping a food journal and adhering to recommended goals of 1000 calories and 80 grams of protein.  Exercise: Alisha Chen has been instructed to work up to a goal of 150 minutes of combined cardio and strengthening  exercise per week, to try a geriatric exercise plan, and that some exercise is better than none for weight loss and overall health benefits.   Behavior Modification:  We discussed the following Behavioral Modification Strategies today: increasing lean protein intake, decreasing simple carbohydrates, increasing vegetables, increase H2O intake, increase high fiber foods, no skipping meals, meal planning and cooking strategies, emotional eating strategies , holiday eating strategies, avoiding temptations, and planning for success. We discussed various medication options to help Alisha Chen with her weight loss efforts and we both agreed to continue to work on nutritional and behavioral strategies to promote weight loss.  .  Return in about 6 weeks (around 12/24/2024).Alisha Chen She was informed of the importance of frequent follow up visits to maximize her success with intensive lifestyle modifications for her multiple health conditions.  Attestation Statements:   Reviewed by clinician on day of visit: allergies, medications, problem list, medical history, surgical history, family history, social history, and previous encounter notes.   Time spent on visit including pre-visit chart review and post-visit care and charting was 25 minutes.    Harrell Niehoff, PA-C  "

## 2024-11-12 ENCOUNTER — Encounter (INDEPENDENT_AMBULATORY_CARE_PROVIDER_SITE_OTHER): Payer: Self-pay | Admitting: Physician Assistant

## 2024-11-12 ENCOUNTER — Ambulatory Visit (INDEPENDENT_AMBULATORY_CARE_PROVIDER_SITE_OTHER): Admitting: Physician Assistant

## 2024-11-12 VITALS — BP 114/75 | HR 75 | Temp 98.0°F | Ht 66.0 in | Wt 199.0 lb

## 2024-11-12 DIAGNOSIS — E66811 Obesity, class 1: Secondary | ICD-10-CM | POA: Diagnosis not present

## 2024-11-12 DIAGNOSIS — Z6832 Body mass index (BMI) 32.0-32.9, adult: Secondary | ICD-10-CM

## 2024-11-12 DIAGNOSIS — F439 Reaction to severe stress, unspecified: Secondary | ICD-10-CM

## 2024-11-12 DIAGNOSIS — E88819 Insulin resistance, unspecified: Secondary | ICD-10-CM | POA: Diagnosis not present

## 2024-11-12 DIAGNOSIS — E039 Hypothyroidism, unspecified: Secondary | ICD-10-CM

## 2024-11-12 DIAGNOSIS — E559 Vitamin D deficiency, unspecified: Secondary | ICD-10-CM | POA: Diagnosis not present

## 2024-11-12 DIAGNOSIS — E7849 Other hyperlipidemia: Secondary | ICD-10-CM | POA: Diagnosis not present

## 2024-11-12 DIAGNOSIS — R5383 Other fatigue: Secondary | ICD-10-CM | POA: Diagnosis not present

## 2024-11-12 DIAGNOSIS — R632 Polyphagia: Secondary | ICD-10-CM | POA: Diagnosis not present

## 2024-11-12 DIAGNOSIS — R0602 Shortness of breath: Secondary | ICD-10-CM

## 2024-11-13 LAB — LIPID PANEL WITH LDL/HDL RATIO
Cholesterol, Total: 186 mg/dL (ref 100–199)
HDL: 48 mg/dL
LDL Chol Calc (NIH): 125 mg/dL — ABNORMAL HIGH (ref 0–99)
LDL/HDL Ratio: 2.6 ratio (ref 0.0–3.2)
Triglycerides: 67 mg/dL (ref 0–149)
VLDL Cholesterol Cal: 13 mg/dL (ref 5–40)

## 2024-11-13 LAB — CBC WITH DIFFERENTIAL/PLATELET
Basophils Absolute: 0.1 x10E3/uL (ref 0.0–0.2)
Basos: 1 %
EOS (ABSOLUTE): 0.2 x10E3/uL (ref 0.0–0.4)
Eos: 4 %
Hematocrit: 41.7 % (ref 34.0–46.6)
Hemoglobin: 13.2 g/dL (ref 11.1–15.9)
Immature Grans (Abs): 0 x10E3/uL (ref 0.0–0.1)
Immature Granulocytes: 0 %
Lymphocytes Absolute: 1.7 x10E3/uL (ref 0.7–3.1)
Lymphs: 31 %
MCH: 28.5 pg (ref 26.6–33.0)
MCHC: 31.7 g/dL (ref 31.5–35.7)
MCV: 90 fL (ref 79–97)
Monocytes Absolute: 0.3 x10E3/uL (ref 0.1–0.9)
Monocytes: 5 %
Neutrophils Absolute: 3.2 x10E3/uL (ref 1.4–7.0)
Neutrophils: 59 %
Platelets: 250 x10E3/uL (ref 150–450)
RBC: 4.63 x10E6/uL (ref 3.77–5.28)
RDW: 12.1 % (ref 11.7–15.4)
WBC: 5.4 x10E3/uL (ref 3.4–10.8)

## 2024-11-13 LAB — CMP14+EGFR
ALT: 27 IU/L (ref 0–32)
AST: 24 IU/L (ref 0–40)
Albumin: 4.2 g/dL (ref 3.8–4.9)
Alkaline Phosphatase: 122 IU/L (ref 49–135)
BUN/Creatinine Ratio: 19 (ref 9–23)
BUN: 18 mg/dL (ref 6–24)
Bilirubin Total: 0.3 mg/dL (ref 0.0–1.2)
CO2: 24 mmol/L (ref 20–29)
Calcium: 11.2 mg/dL — ABNORMAL HIGH (ref 8.7–10.2)
Chloride: 102 mmol/L (ref 96–106)
Creatinine, Ser: 0.97 mg/dL (ref 0.57–1.00)
Globulin, Total: 3 g/dL (ref 1.5–4.5)
Glucose: 74 mg/dL (ref 70–99)
Potassium: 4.9 mmol/L (ref 3.5–5.2)
Sodium: 139 mmol/L (ref 134–144)
Total Protein: 7.2 g/dL (ref 6.0–8.5)
eGFR: 67 mL/min/1.73

## 2024-11-13 LAB — VITAMIN B12: Vitamin B-12: 1208 pg/mL (ref 232–1245)

## 2024-11-13 LAB — HEMOGLOBIN A1C
Est. average glucose Bld gHb Est-mCnc: 100 mg/dL
Hgb A1c MFr Bld: 5.1 % (ref 4.8–5.6)

## 2024-11-13 LAB — VITAMIN D 25 HYDROXY (VIT D DEFICIENCY, FRACTURES): Vit D, 25-Hydroxy: 61.3 ng/mL (ref 30.0–100.0)

## 2024-11-13 LAB — T3: T3, Total: 121 ng/dL (ref 71–180)

## 2024-11-13 LAB — TSH: TSH: 2.77 u[IU]/mL (ref 0.450–4.500)

## 2024-11-13 LAB — INSULIN, RANDOM: INSULIN: 18.2 u[IU]/mL (ref 2.6–24.9)

## 2024-11-13 LAB — T4, FREE: Free T4: 1.26 ng/dL (ref 0.82–1.77)

## 2024-11-18 ENCOUNTER — Other Ambulatory Visit: Payer: Self-pay | Admitting: Family Medicine

## 2024-11-18 ENCOUNTER — Ambulatory Visit: Admitting: Obstetrics and Gynecology

## 2024-11-18 ENCOUNTER — Other Ambulatory Visit (HOSPITAL_COMMUNITY)
Admission: RE | Admit: 2024-11-18 | Discharge: 2024-11-18 | Disposition: A | Discharge status: Home / Self Care | Source: Ambulatory Visit | Attending: Obstetrics and Gynecology | Admitting: Obstetrics and Gynecology

## 2024-11-18 ENCOUNTER — Encounter: Payer: Self-pay | Admitting: Obstetrics and Gynecology

## 2024-11-18 VITALS — BP 112/75 | HR 77 | Ht 67.0 in | Wt 207.0 lb

## 2024-11-18 DIAGNOSIS — E039 Hypothyroidism, unspecified: Secondary | ICD-10-CM

## 2024-11-18 DIAGNOSIS — Z01419 Encounter for gynecological examination (general) (routine) without abnormal findings: Secondary | ICD-10-CM

## 2024-11-18 NOTE — Progress Notes (Signed)
 Subjective:     Alisha Chen is a 58 y.o. female P2 postmenopausal and BMI 32 who is here for a comprehensive physical exam. The patient reports no problems. She denies any episodes of postmenopausal vaginal bleeding. She is sexually active without complaints. She denies urinary symptoms or issues with bowel movements. Patient is due for screening mammogram and colonoscopy. 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,dcis   History of chemotherapy 03/23/12- 05/10/12   s/p 4 cycles of FEC    Hx: UTI (urinary tract infection)    Knee pain    Lactose intolerance    Neck pain    S/P radiation therapy 07/10/12-08/23/12   Left Breast: 50 Gy/25 Fractions; Boost: 10 Gy/5 Fractions   Status post chemotherapy 1 dose 05/23/12   Stopped after one cycle of Taxol  due to Grade 2 Toxicity   Status post chemotherapy 1 dose 05/23/12   Stopped after one cycle of Taxol  due to Grade 2 Toxicity   Past Surgical History:  Procedure Laterality Date   AXILLARY SURGERY  03/06/12   left, regional resection, 1/3 nodes pos   BACK SURGERY     LUMBAR DECOMPRESSION   BREAST BIOPSY  01/09/2012   left breast 3 0'clock/ER?PR =positive,her 2 neg   BREAST LUMPECTOMY Left 02/10/12   Left Breast: 2 Foci of Invasive Ductal Caand High grade Ductal Carcinoma In Situ, 0/14 nodes Left Axilla Negative : Regional Resection of Lymph Nodes: 0/5 Nodes Negative   FOOT SURGERY     multiple   INTRAUTERINE DEVICE REMOVAL  01/24/12   needle core Biospy  01/09/12   Left Breast: Invasive Ductal Carcinoma, Lymph Node Axilla: Metastatic Mammary Carcinoma   PORTACATH PLACEMENT  02/10/2012   Procedure: INSERTION PORT-A-CATH;  Surgeon: Donnice Bury, MD;  Location: [facility];  Service: General;  Laterality: Right;   Regional Resection  03/06/12   Left Axilla: 1/3 Nodes Metastatic Carcinoma    SPINE SURGERY  01/2011   Family History  Problem Relation Age of Onset   Diabetes Mother    Sudden death Mother    Schizophrenia Mother    Sleep apnea Mother    Obesity Mother    Seizures Son     Social History   Socioeconomic History   Marital status: Married    Spouse name: Darina   Number of children: 1   Years of education: Not on file   Highest education level: Not on file  Occupational History   Occupation: runner, broadcasting/film/video, Paediatric Nurse    Employer: [employer]    Comment: [employer]: [employer]  Tobacco Use   Smoking status: Never   Smokeless tobacco: Never  Vaping Use   Vaping status: Never Used  Substance and Sexual Activity   Alcohol use: No   Drug use: No   Sexual activity: Yes    Partners: Male    Birth control/protection: None    Comment: menarche age 56,premenopausa; G!P1,1st pregnancy age 84  Other Topics Concern   Not on file  Social History Narrative   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   Social Drivers of Health   Tobacco Use: Low Risk (11/18/2024)   Patient History    Smoking Tobacco Use: Never    Smokeless Tobacco Use:  Never    Passive Exposure: Not on file  Financial Resource Strain: Not on file  Food Insecurity: Not on file  Transportation Needs: Not on file  Physical Activity: Not on file  Stress: Not on file  Social Connections: Not on file  Intimate Partner Violence: Not on file  Depression (PHQ2-9): Low Risk (11/18/2024)   Depression (PHQ2-9)    PHQ-2 Score: 0  Alcohol Screen: Not on file  Housing: Not on file  Utilities: Not on file  Health Literacy: Not on file   Health Maintenance  Topic Date Due   HIV Screening  Never done   Hepatitis C Screening  Never done   Zoster Vaccines- Shingrix (1 of 2) Never done   Hepatitis B Vaccines 19-59 Average Risk (2 of 3 - 19+ 3-dose series) 11/08/2011   Pneumococcal Vaccine: 50+ Years (1 of 1 - PCV) Never done   COVID-19  Vaccine (3 - Pfizer risk series) 05/14/2020   DTaP/Tdap/Td (2 - Td or Tdap) 09/19/2021   Cervical Cancer Screening (HPV/Pap Cotest)  01/05/2024   Colonoscopy  09/09/2024   Mammogram  01/18/2025   Influenza Vaccine  02/18/2025 (Originally 06/21/2024)   HPV VACCINES  Aged Out   Meningococcal B Vaccine  Aged Out       Review of Systems Pertinent items noted in HPI and remainder of comprehensive ROS otherwise negative.   Objective:  Blood pressure 112/75, pulse 77, height 5' 7 (1.702 m), weight 207 lb (93.9 kg), last menstrual period 02/27/2012.   GENERAL: Well-developed, well-nourished female in no acute distress.  HEENT: Normocephalic, atraumatic. Sclerae anicteric.  NECK: Supple. Normal thyroid .  LUNGS: Clear to auscultation bilaterally.  HEART: Regular rate and rhythm. BREASTS: Symmetric in size. No palpable masses or lymphadenopathy, skin changes, or nipple drainage. ABDOMEN: Soft, nontender, nondistended. No organomegaly. PELVIC: Normal external female genitalia. Vagina is pink and rugated.  Normal discharge. Normal appearing cervix. Uterus is normal in size. No adnexal mass or tenderness. Chaperone present during the pelvic exam EXTREMITIES: No cyanosis, clubbing, or edema, 2+ distal pulses.     Assessment:    Healthy female exam.      Plan:    Pap smear collected Screening mammogram ordered Colonoscopy referral placed Patient will be contacted with abnormal results  See After Visit Summary for Counseling Recommendations

## 2024-11-19 LAB — CYTOLOGY - PAP
Comment: NEGATIVE
Diagnosis: NEGATIVE
High risk HPV: NEGATIVE

## 2024-11-25 ENCOUNTER — Ambulatory Visit (INDEPENDENT_AMBULATORY_CARE_PROVIDER_SITE_OTHER): Admitting: Family Medicine

## 2024-11-25 ENCOUNTER — Encounter: Payer: Self-pay | Admitting: Family Medicine

## 2024-11-25 VITALS — BP 118/80 | HR 81 | Temp 98.4°F | Resp 18 | Ht 67.0 in | Wt 208.6 lb

## 2024-11-25 DIAGNOSIS — M25532 Pain in left wrist: Secondary | ICD-10-CM | POA: Diagnosis not present

## 2024-11-25 DIAGNOSIS — H6501 Acute serous otitis media, right ear: Secondary | ICD-10-CM | POA: Diagnosis not present

## 2024-11-25 MED ORDER — AZELASTINE HCL 0.1 % NA SOLN: 1.0000 | Freq: Two times a day (BID) | NASAL | 12 refills | Status: AC

## 2024-11-25 NOTE — Progress Notes (Signed)
 + ++   Subjective:    Patient ID: Alisha Chen, female    DOB: 07/27/1966, 59 y.o.   MRN: [id number]  Chief Complaint  Patient presents with   Ear Pain    Pt states painn has been going on for 3 weeks, pt states felt full but states pain has improved.     HPI Patient is in today for R ear pain.    Discussed the use of AI scribe software for clinical note transcription with the patient, who gave verbal consent to proceed.  History of Present Illness Alisha Chen is a 59 year old female who presents with ear fullness and discomfort.  She experiences a sensation of fullness in her ear, described as feeling 'full of fluid'. This is accompanied by a noise when she blows her nose, described as 'skeet, skeet'. She has been using Flonase  and levocetirizine, which have helped alleviate the symptoms. The symptoms have improved since she made the appointment, although she still notices some dullness in the ear.  She has a history of a ganglion cyst on her wrist, which was previously drained. Currently, she experiences pain in her left wrist, particularly when turning her hand or reaching behind her back. She recalls being referred to a hand doctor or sports medicine specialist in the past, specifically mentioning Darlyn Sharps at [facility]. The previous cyst on her wrist was drained, and it no longer causes discomfort.     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,dcis   History of chemotherapy 03/23/12- 05/10/12   s/p 4 cycles of FEC    Hx: UTI (urinary tract infection)    Knee pain    Lactose intolerance    Neck pain    S/P radiation therapy 07/10/12-08/23/12   Left Breast: 50 Gy/25 Fractions; Boost: 10 Gy/5 Fractions   Status post chemotherapy 1 dose 05/23/12   Stopped after one cycle of Taxol  due to Grade 2 Toxicity   Status post chemotherapy 1 dose 05/23/12   Stopped after one  cycle of Taxol  due to Grade 2 Toxicity    Past Surgical History:  Procedure Laterality Date   AXILLARY SURGERY  03/06/12   left, regional resection, 1/3 nodes pos   BACK SURGERY     LUMBAR DECOMPRESSION   BREAST BIOPSY  01/09/2012   left breast 3 0'clock/ER?PR =positive,her 2 neg   BREAST LUMPECTOMY Left 02/10/12   Left Breast: 2 Foci of Invasive Ductal Caand High grade Ductal Carcinoma In Situ, 0/14 nodes Left Axilla Negative : Regional Resection of Lymph Nodes: 0/5 Nodes Negative   FOOT SURGERY     multiple   INTRAUTERINE DEVICE REMOVAL  01/24/12   needle core Biospy  01/09/12   Left Breast: Invasive Ductal Carcinoma, Lymph Node Axilla: Metastatic Mammary Carcinoma   PORTACATH PLACEMENT  02/10/2012   Procedure: INSERTION PORT-A-CATH;  Surgeon: Donnice Bury, MD;  Location: [facility];  Service: General;  Laterality: Right;   Regional Resection  03/06/12   Left Axilla: 1/3 Nodes Metastatic Carcinoma   SPINE SURGERY  01/2011    Family History  Problem Relation Age of Onset   Diabetes Mother    Sudden death Mother    Schizophrenia Mother    Sleep apnea Mother    Obesity Mother    Seizures Son     Social History   Socioeconomic  History   Marital status: Married    Spouse name: Darina   Number of children: 1   Years of education: Not on file   Highest education level: Not on file  Occupational History   Occupation: runner, broadcasting/film/video, Secondary School Teacher: [employer]    Comment: [employer] Engineer: [employer]  Tobacco Use   Smoking status: Never   Smokeless tobacco: Never  Vaping Use   Vaping status: Never Used  Substance and Sexual Activity   Alcohol use: No   Drug use: No   Sexual activity: Yes    Partners: Male    Birth control/protection: None    Comment: menarche age 20,premenopausa; G!P1,1st pregnancy age 52  Other Topics Concern   Not on file  Social History Narrative   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   Social Drivers of Health   Tobacco Use: Low Risk (11/25/2024)   Patient History    Smoking Tobacco Use: Never    Smokeless Tobacco Use: Never    Passive Exposure: Not on file  Financial Resource Strain: Not on file  Food Insecurity: Not on file  Transportation Needs: Not on file  Physical Activity: Not on file  Stress: Not on file  Social Connections: Not on file  Intimate Partner Violence: Not on file  Depression (PHQ2-9): Low Risk (11/18/2024)   Depression (PHQ2-9)    PHQ-2 Score: 0  Alcohol Screen: Not on file  Housing: Not on file  Utilities: Not on file  Health Literacy: Not on file    Outpatient Medications Prior to Visit  Medication Sig Dispense Refill   b complex vitamins tablet Take 1 tablet by mouth daily.     carboxymethylcellulose (REFRESH PLUS) 0.5 % SOLN 1 drop 3 (three) times daily as needed.     celecoxib (CELEBREX) 200 MG capsule Take 1 tablet by mouth daily.     cholecalciferol (VITAMIN D ) 1000 UNITS tablet Take 1,000 Units by mouth daily.     fluticasone  (FLONASE ) 50 MCG/ACT nasal spray Place 2 sprays into both nostrils daily. 16 g 6   levocetirizine (XYZAL ) 5 MG tablet TAKE 1 TABLET BY MOUTH EVERY DAY IN THE EVENING 90 tablet 0   levothyroxine  (SYNTHROID ) 50 MCG tablet Take 1 tablet (50 mcg total) by mouth daily before breakfast. 90 tablet 1   No facility-administered medications prior to visit.    Allergies  Allergen Reactions   Penicillins Other (See Comments)    Not sure, but was told by mother never to take this medication as a child   Shellfish Protein-Containing Drug Products Other (See Comments)    Welts   Clarithromycin Other (See Comments)    REACTION: NAUSEA,WEAK,BITTER TASTE   Ondansetron  Nausea And Vomiting    Headaches    Review of Systems  Constitutional:  Negative for fever and malaise/fatigue.  HENT:  Negative for congestion.   Eyes:  Negative for blurred vision.  Respiratory:  Negative for cough and shortness  of breath.   Cardiovascular:  Negative for chest pain, palpitations and leg swelling.  Gastrointestinal:  Negative for abdominal pain, blood in stool, nausea and vomiting.  Genitourinary:  Negative for dysuria and frequency.  Musculoskeletal:  Negative for back pain and falls.  Skin:  Negative for rash.  Neurological:  Negative for dizziness, loss of consciousness and headaches.  Endo/Heme/Allergies:  Negative for environmental allergies.  Psychiatric/Behavioral:  Negative for depression. The patient is not  nervous/anxious.        Objective:    Physical Exam Vitals and nursing note reviewed.  HENT:     Right Ear: Hearing normal. No decreased hearing noted. A middle ear effusion is present.     Left Ear: Hearing normal. No decreased hearing noted. A middle ear effusion is present.     Ears:     Comments: /r>L Musculoskeletal:       Arms:     Comments: Tenderness L wrist      BP 118/80 (BP Location: Right Arm, Patient Position: Sitting, Cuff Size: Normal)   Pulse 81   Temp 98.4 F (36.9 C) (Oral)   Resp 18   Ht 5' 7 (1.702 m)   Wt 208 lb 9.6 oz (94.6 kg)   LMP 02/27/2012   SpO2 98%   BMI 32.67 kg/m  Wt Readings from Last 3 Encounters:  11/25/24 208 lb 9.6 oz (94.6 kg)  11/18/24 207 lb (93.9 kg)  11/12/24 199 lb (90.3 kg)    Diabetic Foot Exam - Simple   No data filed    Lab Results  Component Value Date   WBC 5.4 11/12/2024   HGB 13.2 11/12/2024   HCT 41.7 11/12/2024   PLT 250 11/12/2024   GLUCOSE 74 11/12/2024   CHOL 186 11/12/2024   TRIG 67 11/12/2024   HDL 48 11/12/2024   LDLCALC 125 (H) 11/12/2024   ALT 27 11/12/2024   AST 24 11/12/2024   NA 139 11/12/2024   K 4.9 11/12/2024   CL 102 11/12/2024   CREATININE 0.97 11/12/2024   BUN 18 11/12/2024   CO2 24 11/12/2024   TSH 2.770 11/12/2024   INR 1.04 02/10/2011   HGBA1C 5.1 11/12/2024    Lab Results  Component Value Date   TSH 2.770 11/12/2024   Lab Results  Component Value Date   WBC 5.4  11/12/2024   HGB 13.2 11/12/2024   HCT 41.7 11/12/2024   MCV 90 11/12/2024   PLT 250 11/12/2024   Lab Results  Component Value Date   NA 139 11/12/2024   K 4.9 11/12/2024   CHLORIDE 105 12/25/2014   CO2 24 11/12/2024   GLUCOSE 74 11/12/2024   BUN 18 11/12/2024   CREATININE 0.97 11/12/2024   BILITOT 0.3 11/12/2024   ALKPHOS 122 11/12/2024   AST 24 11/12/2024   ALT 27 11/12/2024   PROT 7.2 11/12/2024   ALBUMIN 4.2 11/12/2024   CALCIUM 11.2 (H) 11/12/2024   ANIONGAP 8 12/25/2014   EGFR 67 11/12/2024   GFR 76.80 12/25/2019   Lab Results  Component Value Date   CHOL 186 11/12/2024   Lab Results  Component Value Date   HDL 48 11/12/2024   Lab Results  Component Value Date   LDLCALC 125 (H) 11/12/2024   Lab Results  Component Value Date   TRIG 67 11/12/2024   Lab Results  Component Value Date   CHOLHDL 4.2 07/04/2023   Lab Results  Component Value Date   HGBA1C 5.1 11/12/2024       Assessment & Plan:  Non-recurrent acute serous otitis media of right ear Assessment & Plan: Con't flonase  and xyzal   Add astelin     Orders: -     Azelastine  HCl; Place 1 spray into both nostrils 2 (two) times daily. Use in each nostril as directed  Dispense: 30 mL; Refill: 12  Left wrist pain -     Ambulatory referral to Sports Medicine    Jamee JONELLE Antonio Cyndee, DO

## 2024-11-25 NOTE — Assessment & Plan Note (Signed)
 Con't flonase  and xyzal   Add astelin 

## 2024-11-25 NOTE — Assessment & Plan Note (Signed)
 F/u with sport med

## 2024-12-18 ENCOUNTER — Other Ambulatory Visit: Payer: Self-pay

## 2024-12-24 ENCOUNTER — Encounter (INDEPENDENT_AMBULATORY_CARE_PROVIDER_SITE_OTHER): Payer: Self-pay | Admitting: Physician Assistant

## 2024-12-24 ENCOUNTER — Ambulatory Visit (INDEPENDENT_AMBULATORY_CARE_PROVIDER_SITE_OTHER): Admitting: Physician Assistant

## 2024-12-24 VITALS — BP 109/71 | HR 74 | Temp 98.0°F | Ht 66.0 in | Wt 203.0 lb

## 2024-12-24 DIAGNOSIS — E559 Vitamin D deficiency, unspecified: Secondary | ICD-10-CM

## 2024-12-24 DIAGNOSIS — Z6832 Body mass index (BMI) 32.0-32.9, adult: Secondary | ICD-10-CM

## 2024-12-24 DIAGNOSIS — E785 Hyperlipidemia, unspecified: Secondary | ICD-10-CM | POA: Diagnosis not present

## 2024-12-24 DIAGNOSIS — M549 Dorsalgia, unspecified: Secondary | ICD-10-CM | POA: Diagnosis not present

## 2024-12-24 DIAGNOSIS — E88819 Insulin resistance, unspecified: Secondary | ICD-10-CM | POA: Diagnosis not present

## 2024-12-24 DIAGNOSIS — R632 Polyphagia: Secondary | ICD-10-CM | POA: Diagnosis not present

## 2024-12-24 DIAGNOSIS — M5440 Lumbago with sciatica, unspecified side: Secondary | ICD-10-CM

## 2024-12-24 DIAGNOSIS — F439 Reaction to severe stress, unspecified: Secondary | ICD-10-CM

## 2024-12-24 DIAGNOSIS — E66811 Obesity, class 1: Secondary | ICD-10-CM | POA: Diagnosis not present

## 2024-12-24 DIAGNOSIS — E7849 Other hyperlipidemia: Secondary | ICD-10-CM

## 2024-12-24 NOTE — Progress Notes (Unsigned)
 " [facility] [address] [place] [address] Phone: [phone] Subjective:   Alisha Chen, am serving as a scribe for Dr. Arthea Alisha.  I'm seeing this patient by the request  of:  Alisha Chen  CC: Left wrist pain  YEP:Dlagzrupcz  Alisha Chen is a 59 y.o. female coming in with complaint of L wrist pain, history of a ganglion cyst in the left wrist previously.Pain over ulnar aspect. Using her hand is more painful than at rest. Pain has improved but still has swelling in that area.       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,dcis   History of chemotherapy 03/23/12- 05/10/12   s/p 4 cycles of FEC    Hx: UTI (urinary tract infection)    Knee pain    Lactose intolerance    Neck pain    S/P radiation therapy 07/10/12-08/23/12   Left Breast: 50 Gy/25 Fractions; Boost: 10 Gy/5 Fractions   Status post chemotherapy 1 dose 05/23/12   Stopped after one cycle of Taxol  due to Grade 2 Toxicity   Status post chemotherapy 1 dose 05/23/12   Stopped after one cycle of Taxol  due to Grade 2 Toxicity   Past Surgical History:  Procedure Laterality Date   AXILLARY SURGERY  03/06/12   left, regional resection, 1/3 nodes pos   BACK SURGERY     LUMBAR DECOMPRESSION   BREAST BIOPSY  01/09/2012   left breast 3 0'clock/ER?PR =positive,her 2 neg   BREAST LUMPECTOMY Left 02/10/12   Left Breast: 2 Foci of Invasive Ductal Caand High grade Ductal Carcinoma In Situ, 0/14 nodes Left Axilla Negative : Regional Resection of Lymph Nodes: 0/5 Nodes Negative   FOOT SURGERY     multiple   INTRAUTERINE DEVICE REMOVAL  01/24/12   needle core Biospy  01/09/12   Left Breast: Invasive Ductal Carcinoma, Lymph Node Axilla: Metastatic Mammary Carcinoma   PORTACATH PLACEMENT  02/10/2012   Procedure: INSERTION PORT-A-CATH;  Surgeon: Donnice Bury, MD;  Location: [facility];  Service: General;  Laterality: Right;   Regional Resection  03/06/12   Left Axilla: 1/3 Nodes Metastatic Carcinoma   SPINE SURGERY  01/2011   Social History   Socioeconomic History   Marital status: Married    Spouse name: Alisha Chen   Number of children: 1   Years of education: Not on file   Highest education level: Not on file  Occupational History   Occupation: runner, broadcasting/film/video, Paediatric Nurse    Employer: [employer]    Comment: [employer]: [employer]  Tobacco Use   Smoking status: Never   Smokeless tobacco: Never  Vaping Use   Vaping status: Never Used  Substance and Sexual Activity   Alcohol use: No   Drug use: No   Sexual activity: Yes    Partners: Male    Birth control/protection: None    Comment: menarche age 9,premenopausa; G!P1,1st pregnancy age 74  Other Topics Concern   Not on file  Social History Narrative   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   Social Drivers of Health   Tobacco Use: Low Risk (12/24/2024)   Patient History    Smoking Tobacco Use: Never    Smokeless Tobacco Use: Never  Passive Exposure: Not on file  Financial Resource Strain: Not on file  Food Insecurity: Not on file  Transportation Needs: Not on file  Physical Activity: Not on file  Stress: Not on file  Social Connections: Not on file  Depression (PHQ2-9): Low Risk (11/18/2024)   Depression (PHQ2-9)    PHQ-2 Score: 0  Alcohol Screen: Not on file  Housing: Not on file  Utilities: Not on file  Health Literacy: Not on file   Allergies[1] Family History  Problem Relation Age of Onset   Diabetes Mother    Sudden death Mother    Schizophrenia Mother    Sleep apnea Mother    Obesity Mother    Seizures Son     Current Outpatient Medications (Endocrine & Metabolic):    levothyroxine  (SYNTHROID ) 50 MCG tablet, Take 1 tablet (50 mcg total) by mouth daily before breakfast.  Current Outpatient Medications  (Respiratory):    azelastine  (ASTELIN ) 0.1 % nasal spray, Place 1 spray into both nostrils 2 (two) times daily. Use in each nostril as directed   fluticasone  (FLONASE ) 50 MCG/ACT nasal spray, Place 2 sprays into both nostrils daily.   levocetirizine (XYZAL ) 5 MG tablet, TAKE 1 TABLET BY MOUTH EVERY DAY IN THE EVENING  Current Outpatient Medications (Analgesics):    celecoxib (CELEBREX) 200 MG capsule, Take 1 tablet by mouth daily.  Current Outpatient Medications (Other):    b complex vitamins tablet, Take 1 tablet by mouth daily.   carboxymethylcellulose (REFRESH PLUS) 0.5 % SOLN, 1 drop 3 (three) times daily as needed.   cholecalciferol (VITAMIN D ) 1000 UNITS tablet, Take 1,000 Units by mouth daily.   Reviewed prior external information including notes and imaging from  primary care provider As well as notes that were available from care everywhere and other healthcare systems.  Past medical history, social, surgical and family history all reviewed in electronic medical record.  No pertanent information unless stated regarding to the chief complaint.   Review of Systems:  No headache, visual changes, nausea, vomiting, diarrhea, constipation, dizziness, abdominal pain, skin rash, fevers, chills, night sweats, weight loss, swollen lymph nodes, body aches, joint swelling, chest pain, shortness of breath, mood changes. POSITIVE muscle aches  Objective  Blood pressure 106/64, pulse (!) 56, height 5' 6 (1.676 m), weight 210 lb (95.3 kg), last menstrual period 02/27/2012, SpO2 97%.   General: No apparent distress alert and oriented x3 mood and affect normal, dressed appropriately.  HEENT: Pupils equal, extraocular movements intact  Respiratory: Patient's speak in full sentences and does not appear short of breath  Cardiovascular: No lower extremity edema, non tender, no erythema   Left wrist exam shows patient does have fullness noted on the ulnar aspect of the wrist but seems to be more  dorsal than actually ventral.  Patient has good grip strength noted.  Negative Tinel's noted.  Limited muscular skeletal ultrasound was performed and interpreted by Alisha HUSSAR, M  Patient has some mild lipoma noted in the area where patient does have the discomfort.  Patient though does have some hypoechoic changes around the extensor carpi ulnaris tendon sheath.  Nerve appears to be unremarkable.  No abnormal blood flow. 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

## 2024-12-25 ENCOUNTER — Other Ambulatory Visit: Payer: Self-pay

## 2024-12-25 ENCOUNTER — Ambulatory Visit: Admitting: Family Medicine

## 2024-12-25 ENCOUNTER — Encounter: Payer: Self-pay | Admitting: Family Medicine

## 2024-12-25 VITALS — BP 106/64 | HR 56 | Ht 66.0 in | Wt 210.0 lb

## 2024-12-25 DIAGNOSIS — M25532 Pain in left wrist: Secondary | ICD-10-CM

## 2024-12-25 NOTE — Patient Instructions (Signed)
 Good luck with 6th graders [employer] Exercises See me again in 8 weeks

## 2024-12-25 NOTE — Assessment & Plan Note (Signed)
 Patient has what appears to be a mild tendinopathy as well as what appears to be more of a lipoma.  I do not see a ganglion cyst at this time or anything that would be able to be aspirated.  We discussed which activities to do and which ones to avoid.  Increase activity slowly.  Discussed icing regimen.  Follow-up again in 6 to 8 weeks.

## 2025-01-02 ENCOUNTER — Inpatient Hospital Stay: Admitting: Adult Health

## 2025-01-27 ENCOUNTER — Ambulatory Visit (INDEPENDENT_AMBULATORY_CARE_PROVIDER_SITE_OTHER): Admitting: Physician Assistant

## 2025-01-28 ENCOUNTER — Ambulatory Visit

## 2025-02-17 ENCOUNTER — Ambulatory Visit

## 2025-02-26 ENCOUNTER — Ambulatory Visit: Admitting: Family Medicine
